# Patient Record
Sex: Female | Born: 1935 | Race: White | Hispanic: No | Smoking: Former smoker
Health system: Southern US, Community
[De-identification: ages and names within clinical notes are randomized; demographics above are authoritative.]

## PROBLEM LIST (undated history)

## (undated) DIAGNOSIS — I471 Supraventricular tachycardia: Secondary | ICD-10-CM

## (undated) DIAGNOSIS — F411 Generalized anxiety disorder: Secondary | ICD-10-CM

## (undated) DIAGNOSIS — I10 Essential (primary) hypertension: Secondary | ICD-10-CM

## (undated) DIAGNOSIS — H3530 Unspecified macular degeneration: Secondary | ICD-10-CM

## (undated) DIAGNOSIS — R7303 Prediabetes: Secondary | ICD-10-CM

## (undated) DIAGNOSIS — I4719 Other supraventricular tachycardia: Secondary | ICD-10-CM

## (undated) DIAGNOSIS — M1990 Unspecified osteoarthritis, unspecified site: Secondary | ICD-10-CM

## (undated) DIAGNOSIS — D649 Anemia, unspecified: Secondary | ICD-10-CM

## (undated) DIAGNOSIS — E785 Hyperlipidemia, unspecified: Secondary | ICD-10-CM

## (undated) DIAGNOSIS — M810 Age-related osteoporosis without current pathological fracture: Secondary | ICD-10-CM

## (undated) DIAGNOSIS — C50919 Malignant neoplasm of unspecified site of unspecified female breast: Secondary | ICD-10-CM

## (undated) DIAGNOSIS — I341 Nonrheumatic mitral (valve) prolapse: Secondary | ICD-10-CM

## (undated) HISTORY — DX: Hyperlipidemia, unspecified: E78.5

## (undated) HISTORY — DX: Unspecified osteoarthritis, unspecified site: M19.90

## (undated) HISTORY — DX: Essential (primary) hypertension: I10

## (undated) HISTORY — DX: Generalized anxiety disorder: F41.1

## (undated) HISTORY — DX: Prediabetes: R73.03

## (undated) HISTORY — DX: Supraventricular tachycardia: I47.1

## (undated) HISTORY — DX: Anemia, unspecified: D64.9

## (undated) HISTORY — DX: Unspecified macular degeneration: H35.30

## (undated) HISTORY — PX: CATARACT EXTRACTION: SUR2

## (undated) HISTORY — DX: Other supraventricular tachycardia: I47.19

## (undated) HISTORY — DX: Age-related osteoporosis without current pathological fracture: M81.0

## (undated) HISTORY — DX: Malignant neoplasm of unspecified site of unspecified female breast: C50.919

## (undated) HISTORY — DX: Nonrheumatic mitral (valve) prolapse: I34.1

---

## 1999-05-21 ENCOUNTER — Other Ambulatory Visit: Admission: RE | Admit: 1999-05-21 | Discharge: 1999-05-21 | Payer: Self-pay | Admitting: Family Medicine

## 2001-02-20 ENCOUNTER — Emergency Department (HOSPITAL_COMMUNITY): Admission: EM | Admit: 2001-02-20 | Discharge: 2001-02-20 | Payer: Self-pay | Admitting: Emergency Medicine

## 2001-05-11 ENCOUNTER — Other Ambulatory Visit: Admission: RE | Admit: 2001-05-11 | Discharge: 2001-05-11 | Payer: Self-pay | Admitting: Family Medicine

## 2003-05-31 ENCOUNTER — Other Ambulatory Visit: Admission: RE | Admit: 2003-05-31 | Discharge: 2003-05-31 | Payer: Self-pay | Admitting: Family Medicine

## 2004-05-10 ENCOUNTER — Ambulatory Visit: Payer: Self-pay | Admitting: Internal Medicine

## 2004-05-30 ENCOUNTER — Ambulatory Visit: Payer: Self-pay | Admitting: Family Medicine

## 2004-06-01 ENCOUNTER — Ambulatory Visit: Payer: Self-pay | Admitting: Family Medicine

## 2004-07-13 ENCOUNTER — Ambulatory Visit: Payer: Self-pay | Admitting: Cardiology

## 2004-07-26 ENCOUNTER — Ambulatory Visit: Payer: Self-pay | Admitting: Family Medicine

## 2004-09-03 ENCOUNTER — Ambulatory Visit: Payer: Self-pay | Admitting: Family Medicine

## 2004-09-05 HISTORY — PX: CARPAL TUNNEL RELEASE: SHX101

## 2004-09-12 ENCOUNTER — Ambulatory Visit: Payer: Self-pay | Admitting: Family Medicine

## 2004-09-13 ENCOUNTER — Ambulatory Visit: Payer: Self-pay | Admitting: Cardiology

## 2004-09-17 ENCOUNTER — Ambulatory Visit: Payer: Self-pay

## 2004-11-22 ENCOUNTER — Ambulatory Visit: Payer: Self-pay | Admitting: Family Medicine

## 2005-05-16 ENCOUNTER — Ambulatory Visit: Payer: Self-pay | Admitting: Family Medicine

## 2005-05-29 ENCOUNTER — Other Ambulatory Visit: Admission: RE | Admit: 2005-05-29 | Discharge: 2005-05-29 | Payer: Self-pay | Admitting: Family Medicine

## 2005-05-29 ENCOUNTER — Ambulatory Visit: Payer: Self-pay | Admitting: Family Medicine

## 2005-05-29 ENCOUNTER — Encounter: Payer: Self-pay | Admitting: Family Medicine

## 2005-08-23 ENCOUNTER — Ambulatory Visit: Payer: Self-pay | Admitting: Family Medicine

## 2005-09-13 ENCOUNTER — Ambulatory Visit: Payer: Self-pay | Admitting: Family Medicine

## 2005-10-02 ENCOUNTER — Ambulatory Visit: Payer: Self-pay | Admitting: Family Medicine

## 2005-10-28 ENCOUNTER — Ambulatory Visit: Payer: Self-pay | Admitting: Family Medicine

## 2005-12-10 ENCOUNTER — Ambulatory Visit: Payer: Self-pay | Admitting: Family Medicine

## 2005-12-31 ENCOUNTER — Ambulatory Visit: Payer: Self-pay | Admitting: Family Medicine

## 2006-01-14 ENCOUNTER — Ambulatory Visit: Payer: Self-pay | Admitting: Family Medicine

## 2006-04-03 ENCOUNTER — Ambulatory Visit: Payer: Self-pay | Admitting: Family Medicine

## 2006-04-30 ENCOUNTER — Ambulatory Visit: Payer: Self-pay | Admitting: Family Medicine

## 2006-05-06 ENCOUNTER — Encounter: Admission: RE | Admit: 2006-05-06 | Discharge: 2006-05-06 | Payer: Self-pay | Admitting: Family Medicine

## 2006-06-06 ENCOUNTER — Ambulatory Visit: Payer: Self-pay | Admitting: Family Medicine

## 2006-06-18 ENCOUNTER — Ambulatory Visit: Payer: Self-pay | Admitting: Family Medicine

## 2006-06-18 LAB — CONVERTED CEMR LAB
ALT: 47 units/L — ABNORMAL HIGH (ref 0–40)
AST: 35 units/L (ref 0–37)
Albumin: 3.5 g/dL (ref 3.5–5.2)
Alkaline Phosphatase: 53 units/L (ref 39–117)
BUN: 11 mg/dL (ref 6–23)
CO2: 26 meq/L (ref 19–32)
Calcium: 9 mg/dL (ref 8.4–10.5)
Chloride: 107 meq/L (ref 96–112)
Creatinine, Ser: 1 mg/dL (ref 0.4–1.2)
GFR calc non Af Amer: 57 mL/min
Glomerular Filtration Rate, Af Am: 69 mL/min/{1.73_m2}
Glucose, Bld: 124 mg/dL — ABNORMAL HIGH (ref 70–99)
Hgb A1c MFr Bld: 5.8 % (ref 4.6–6.0)
Potassium: 3.9 meq/L (ref 3.5–5.1)
Sodium: 141 meq/L (ref 135–145)
Total Bilirubin: 0.6 mg/dL (ref 0.3–1.2)
Total Protein: 6.6 g/dL (ref 6.0–8.3)

## 2006-07-25 DIAGNOSIS — I10 Essential (primary) hypertension: Secondary | ICD-10-CM

## 2006-07-25 HISTORY — DX: Essential (primary) hypertension: I10

## 2006-07-31 ENCOUNTER — Ambulatory Visit: Payer: Self-pay | Admitting: Family Medicine

## 2006-08-11 ENCOUNTER — Ambulatory Visit: Payer: Self-pay | Admitting: Cardiology

## 2006-08-25 ENCOUNTER — Ambulatory Visit: Payer: Self-pay | Admitting: Cardiology

## 2006-08-25 LAB — CONVERTED CEMR LAB
BUN: 13 mg/dL (ref 6–23)
CO2: 29 meq/L (ref 19–32)
Calcium: 9 mg/dL (ref 8.4–10.5)
Chloride: 87 meq/L — ABNORMAL LOW (ref 96–112)
Creatinine, Ser: 0.7 mg/dL (ref 0.4–1.2)
GFR calc Af Amer: 104 mL/min
GFR calc non Af Amer: 86 mL/min
Glucose, Bld: 116 mg/dL — ABNORMAL HIGH (ref 70–99)
Potassium: 4 meq/L (ref 3.5–5.1)
Sodium: 124 meq/L — ABNORMAL LOW (ref 135–145)

## 2006-09-01 ENCOUNTER — Ambulatory Visit: Payer: Self-pay | Admitting: Family Medicine

## 2006-09-01 LAB — CONVERTED CEMR LAB: Hgb A1c MFr Bld: 6.2 % — ABNORMAL HIGH (ref 4.6–6.0)

## 2006-09-16 ENCOUNTER — Ambulatory Visit: Payer: Self-pay | Admitting: Family Medicine

## 2006-09-16 LAB — CONVERTED CEMR LAB
Albumin: 3.4 g/dL — ABNORMAL LOW (ref 3.5–5.2)
Alkaline Phosphatase: 39 units/L (ref 39–117)
BUN: 11 mg/dL (ref 6–23)
Creatinine, Ser: 0.7 mg/dL (ref 0.4–1.2)
GFR calc Af Amer: 104 mL/min
GFR calc non Af Amer: 86 mL/min
Potassium: 3.7 meq/L (ref 3.5–5.1)

## 2006-09-22 ENCOUNTER — Ambulatory Visit: Payer: Self-pay | Admitting: Cardiology

## 2006-10-03 ENCOUNTER — Ambulatory Visit: Payer: Self-pay | Admitting: Cardiology

## 2006-10-14 ENCOUNTER — Ambulatory Visit: Payer: Self-pay | Admitting: Family Medicine

## 2006-10-20 ENCOUNTER — Ambulatory Visit: Payer: Self-pay | Admitting: Cardiology

## 2006-10-20 LAB — CONVERTED CEMR LAB
BUN: 14 mg/dL (ref 6–23)
CO2: 29 meq/L (ref 19–32)
Calcium: 8.3 mg/dL — ABNORMAL LOW (ref 8.4–10.5)
Creatinine, Ser: 0.8 mg/dL (ref 0.4–1.2)

## 2006-10-31 ENCOUNTER — Ambulatory Visit: Payer: Self-pay | Admitting: Family Medicine

## 2006-11-04 ENCOUNTER — Ambulatory Visit: Payer: Self-pay | Admitting: Cardiology

## 2006-11-04 LAB — CONVERTED CEMR LAB
CO2: 29 meq/L (ref 19–32)
Calcium: 8.9 mg/dL (ref 8.4–10.5)
Chloride: 98 meq/L (ref 96–112)
GFR calc non Af Amer: 86 mL/min
Glucose, Bld: 107 mg/dL — ABNORMAL HIGH (ref 70–99)

## 2006-11-17 ENCOUNTER — Ambulatory Visit: Payer: Self-pay | Admitting: Cardiology

## 2006-12-02 ENCOUNTER — Ambulatory Visit: Payer: Self-pay | Admitting: Family Medicine

## 2006-12-08 ENCOUNTER — Ambulatory Visit: Payer: Self-pay | Admitting: Cardiology

## 2006-12-08 LAB — CONVERTED CEMR LAB
Calcium: 8.8 mg/dL (ref 8.4–10.5)
Chloride: 105 meq/L (ref 96–112)
Creatinine, Ser: 0.8 mg/dL (ref 0.4–1.2)
Glucose, Bld: 164 mg/dL — ABNORMAL HIGH (ref 70–99)
Sodium: 138 meq/L (ref 135–145)

## 2006-12-10 ENCOUNTER — Encounter (INDEPENDENT_AMBULATORY_CARE_PROVIDER_SITE_OTHER): Payer: Self-pay | Admitting: *Deleted

## 2007-01-14 ENCOUNTER — Ambulatory Visit: Payer: Self-pay | Admitting: Cardiology

## 2007-01-21 ENCOUNTER — Ambulatory Visit: Payer: Self-pay | Admitting: Family Medicine

## 2007-02-23 ENCOUNTER — Telehealth (INDEPENDENT_AMBULATORY_CARE_PROVIDER_SITE_OTHER): Payer: Self-pay | Admitting: *Deleted

## 2007-03-10 ENCOUNTER — Telehealth (INDEPENDENT_AMBULATORY_CARE_PROVIDER_SITE_OTHER): Payer: Self-pay | Admitting: *Deleted

## 2007-03-29 ENCOUNTER — Encounter: Payer: Self-pay | Admitting: Family Medicine

## 2007-03-29 DIAGNOSIS — N951 Menopausal and female climacteric states: Secondary | ICD-10-CM

## 2007-04-08 ENCOUNTER — Encounter: Payer: Self-pay | Admitting: Family Medicine

## 2007-05-06 ENCOUNTER — Ambulatory Visit: Payer: Self-pay | Admitting: Family Medicine

## 2007-05-06 ENCOUNTER — Telehealth (INDEPENDENT_AMBULATORY_CARE_PROVIDER_SITE_OTHER): Payer: Self-pay | Admitting: *Deleted

## 2007-05-19 ENCOUNTER — Telehealth: Payer: Self-pay | Admitting: Family Medicine

## 2007-05-19 ENCOUNTER — Encounter: Payer: Self-pay | Admitting: Family Medicine

## 2007-05-25 ENCOUNTER — Encounter: Payer: Self-pay | Admitting: Family Medicine

## 2007-06-05 ENCOUNTER — Encounter (INDEPENDENT_AMBULATORY_CARE_PROVIDER_SITE_OTHER): Payer: Self-pay | Admitting: *Deleted

## 2007-07-27 ENCOUNTER — Ambulatory Visit: Payer: Self-pay | Admitting: Cardiology

## 2007-09-23 ENCOUNTER — Other Ambulatory Visit: Admission: RE | Admit: 2007-09-23 | Discharge: 2007-09-23 | Payer: Self-pay | Admitting: Family Medicine

## 2007-09-23 ENCOUNTER — Ambulatory Visit: Payer: Self-pay | Admitting: Family Medicine

## 2007-09-23 ENCOUNTER — Encounter: Payer: Self-pay | Admitting: Family Medicine

## 2007-09-23 DIAGNOSIS — F419 Anxiety disorder, unspecified: Secondary | ICD-10-CM

## 2007-09-23 DIAGNOSIS — F411 Generalized anxiety disorder: Secondary | ICD-10-CM

## 2007-09-23 DIAGNOSIS — M1990 Unspecified osteoarthritis, unspecified site: Secondary | ICD-10-CM | POA: Insufficient documentation

## 2007-09-23 DIAGNOSIS — M810 Age-related osteoporosis without current pathological fracture: Secondary | ICD-10-CM | POA: Insufficient documentation

## 2007-09-23 DIAGNOSIS — E739 Lactose intolerance, unspecified: Secondary | ICD-10-CM

## 2007-09-23 HISTORY — DX: Generalized anxiety disorder: F41.1

## 2007-09-23 LAB — CONVERTED CEMR LAB
Blood in Urine, dipstick: NEGATIVE
Glucose, Urine, Semiquant: NEGATIVE
Nitrite: NEGATIVE
Protein, U semiquant: NEGATIVE
Urobilinogen, UA: NEGATIVE
WBC Urine, dipstick: NEGATIVE
pH: 7.5

## 2007-09-25 ENCOUNTER — Encounter (INDEPENDENT_AMBULATORY_CARE_PROVIDER_SITE_OTHER): Payer: Self-pay | Admitting: *Deleted

## 2007-09-29 ENCOUNTER — Ambulatory Visit: Payer: Self-pay | Admitting: Family Medicine

## 2007-09-29 ENCOUNTER — Encounter (INDEPENDENT_AMBULATORY_CARE_PROVIDER_SITE_OTHER): Payer: Self-pay | Admitting: *Deleted

## 2007-09-29 LAB — CONVERTED CEMR LAB
OCCULT 1: NEGATIVE
OCCULT 2: NEGATIVE

## 2007-09-30 ENCOUNTER — Telehealth (INDEPENDENT_AMBULATORY_CARE_PROVIDER_SITE_OTHER): Payer: Self-pay | Admitting: *Deleted

## 2007-10-04 LAB — CONVERTED CEMR LAB
ALT: 20 units/L (ref 0–35)
AST: 24 units/L (ref 0–37)
Albumin: 3.8 g/dL (ref 3.5–5.2)
Basophils Relative: 1.1 % — ABNORMAL HIGH (ref 0.0–1.0)
Bilirubin, Direct: 0.1 mg/dL (ref 0.0–0.3)
CRP, High Sensitivity: <1 — ABNORMAL LOW (ref 0.00–5.00)
Calcium: 9.4 mg/dL (ref 8.4–10.5)
Chloride: 106 meq/L (ref 96–112)
Creatinine, Ser: 0.9 mg/dL (ref 0.4–1.2)
Eosinophils Absolute: 0.3 10*3/uL (ref 0.0–0.6)
Eosinophils Relative: 3.6 % (ref 0.0–5.0)
GFR calc non Af Amer: 64 mL/min
Glucose, Bld: 122 mg/dL — ABNORMAL HIGH (ref 70–99)
HCT: 37.8 % (ref 36.0–46.0)
LDL Cholesterol: 71 mg/dL (ref 0–99)
Neutrophils Relative %: 65.3 % (ref 43.0–77.0)
Platelets: 219 10*3/uL (ref 150–400)
RBC: 3.94 M/uL (ref 3.87–5.11)
RDW: 11.9 % (ref 11.5–14.6)
Sodium: 141 meq/L (ref 135–145)
Total Bilirubin: 1.2 mg/dL (ref 0.3–1.2)
Total CHOL/HDL Ratio: 2.7
Triglycerides: 100 mg/dL (ref 0–149)
VLDL: 20 mg/dL (ref 0–40)
WBC: 9.6 10*3/uL (ref 4.5–10.5)

## 2007-10-05 ENCOUNTER — Encounter (INDEPENDENT_AMBULATORY_CARE_PROVIDER_SITE_OTHER): Payer: Self-pay | Admitting: *Deleted

## 2007-10-08 ENCOUNTER — Telehealth (INDEPENDENT_AMBULATORY_CARE_PROVIDER_SITE_OTHER): Payer: Self-pay | Admitting: *Deleted

## 2007-11-20 ENCOUNTER — Telehealth (INDEPENDENT_AMBULATORY_CARE_PROVIDER_SITE_OTHER): Payer: Self-pay | Admitting: *Deleted

## 2007-11-20 DIAGNOSIS — R109 Unspecified abdominal pain: Secondary | ICD-10-CM | POA: Insufficient documentation

## 2007-12-15 ENCOUNTER — Ambulatory Visit: Payer: Self-pay | Admitting: Family Medicine

## 2007-12-17 LAB — CONVERTED CEMR LAB
BUN: 17 mg/dL (ref 6–23)
CO2: 25 meq/L (ref 19–32)
Calcium: 8.9 mg/dL (ref 8.4–10.5)
Chloride: 106 meq/L (ref 96–112)
Cholesterol: 135 mg/dL (ref 0–200)
Creatinine, Ser: 0.9 mg/dL (ref 0.4–1.2)
HDL: 54.6 mg/dL (ref >39.0)
Triglycerides: 50 mg/dL (ref 0–149)

## 2008-02-25 ENCOUNTER — Ambulatory Visit: Payer: Self-pay | Admitting: Family Medicine

## 2008-02-25 DIAGNOSIS — K120 Recurrent oral aphthae: Secondary | ICD-10-CM

## 2008-02-26 ENCOUNTER — Telehealth (INDEPENDENT_AMBULATORY_CARE_PROVIDER_SITE_OTHER): Payer: Self-pay | Admitting: *Deleted

## 2008-03-31 ENCOUNTER — Telehealth (INDEPENDENT_AMBULATORY_CARE_PROVIDER_SITE_OTHER): Payer: Self-pay | Admitting: *Deleted

## 2008-03-31 ENCOUNTER — Ambulatory Visit: Payer: Self-pay | Admitting: Family Medicine

## 2008-05-26 ENCOUNTER — Encounter: Payer: Self-pay | Admitting: Family Medicine

## 2008-06-17 ENCOUNTER — Ambulatory Visit: Payer: Self-pay | Admitting: Family Medicine

## 2008-06-28 LAB — CONVERTED CEMR LAB
Bilirubin, Direct: 0.1 mg/dL (ref 0.0–0.3)
Calcium: 9.1 mg/dL (ref 8.4–10.5)
GFR calc Af Amer: 77 mL/min
GFR calc non Af Amer: 64 mL/min
HDL: 50.9 mg/dL (ref >39.0)
LDL Cholesterol: 63 mg/dL (ref 0–99)
Potassium: 3.7 meq/L (ref 3.5–5.1)
Sodium: 139 meq/L (ref 135–145)
Total Bilirubin: 0.8 mg/dL (ref 0.3–1.2)
Total CHOL/HDL Ratio: 2.6
Total Protein: 7 g/dL (ref 6.0–8.3)
VLDL: 18 mg/dL (ref 0–40)

## 2008-06-29 ENCOUNTER — Encounter (INDEPENDENT_AMBULATORY_CARE_PROVIDER_SITE_OTHER): Payer: Self-pay | Admitting: *Deleted

## 2008-07-21 ENCOUNTER — Encounter: Payer: Self-pay | Admitting: Family Medicine

## 2008-07-21 ENCOUNTER — Ambulatory Visit: Payer: Self-pay | Admitting: Cardiology

## 2008-07-21 ENCOUNTER — Encounter: Payer: Self-pay | Admitting: Cardiology

## 2008-07-21 DIAGNOSIS — R0989 Other specified symptoms and signs involving the circulatory and respiratory systems: Secondary | ICD-10-CM

## 2008-07-26 ENCOUNTER — Ambulatory Visit: Payer: Self-pay

## 2008-08-18 ENCOUNTER — Telehealth: Payer: Self-pay | Admitting: Family Medicine

## 2008-08-19 ENCOUNTER — Ambulatory Visit: Payer: Self-pay | Admitting: Family Medicine

## 2008-08-19 DIAGNOSIS — R300 Dysuria: Secondary | ICD-10-CM | POA: Insufficient documentation

## 2008-08-19 LAB — CONVERTED CEMR LAB
Bilirubin Urine: NEGATIVE
Blood in Urine, dipstick: NEGATIVE
Glucose, Urine, Semiquant: NEGATIVE
Ketones, urine, test strip: NEGATIVE
Protein, U semiquant: NEGATIVE

## 2008-08-22 ENCOUNTER — Encounter (INDEPENDENT_AMBULATORY_CARE_PROVIDER_SITE_OTHER): Payer: Self-pay | Admitting: *Deleted

## 2008-09-26 ENCOUNTER — Ambulatory Visit: Payer: Self-pay | Admitting: Family Medicine

## 2008-09-26 DIAGNOSIS — S61209A Unspecified open wound of unspecified finger without damage to nail, initial encounter: Secondary | ICD-10-CM | POA: Insufficient documentation

## 2008-12-21 ENCOUNTER — Telehealth (INDEPENDENT_AMBULATORY_CARE_PROVIDER_SITE_OTHER): Payer: Self-pay | Admitting: *Deleted

## 2009-01-03 ENCOUNTER — Telehealth (INDEPENDENT_AMBULATORY_CARE_PROVIDER_SITE_OTHER): Payer: Self-pay | Admitting: *Deleted

## 2009-01-03 ENCOUNTER — Ambulatory Visit: Payer: Self-pay | Admitting: Family Medicine

## 2009-01-18 ENCOUNTER — Encounter (INDEPENDENT_AMBULATORY_CARE_PROVIDER_SITE_OTHER): Payer: Self-pay | Admitting: *Deleted

## 2009-01-18 LAB — CONVERTED CEMR LAB
AST: 23 units/L (ref 0–37)
BUN: 13 mg/dL (ref 6–23)
Bilirubin, Direct: 0 mg/dL (ref 0.0–0.3)
CO2: 28 meq/L (ref 19–32)
Calcium: 9.2 mg/dL (ref 8.4–10.5)
Glucose, Bld: 109 mg/dL — ABNORMAL HIGH (ref 70–99)
HDL: 47 mg/dL (ref >39.00)
Hgb A1c MFr Bld: 5.8 % (ref 4.6–6.5)
Potassium: 3.9 meq/L (ref 3.5–5.1)
Sodium: 142 meq/L (ref 135–145)
Total Bilirubin: 0.7 mg/dL (ref 0.3–1.2)
Total CHOL/HDL Ratio: 3
VLDL: 26 mg/dL (ref 0.0–40.0)

## 2009-01-23 ENCOUNTER — Telehealth: Payer: Self-pay | Admitting: Cardiology

## 2009-01-25 ENCOUNTER — Ambulatory Visit: Payer: Self-pay | Admitting: Cardiology

## 2009-02-10 ENCOUNTER — Ambulatory Visit: Payer: Self-pay | Admitting: Family Medicine

## 2009-02-10 DIAGNOSIS — H6500 Acute serous otitis media, unspecified ear: Secondary | ICD-10-CM | POA: Insufficient documentation

## 2009-02-10 DIAGNOSIS — H9209 Otalgia, unspecified ear: Secondary | ICD-10-CM

## 2009-03-30 ENCOUNTER — Ambulatory Visit: Payer: Self-pay | Admitting: Cardiology

## 2009-04-12 ENCOUNTER — Ambulatory Visit: Payer: Self-pay | Admitting: Family Medicine

## 2009-04-13 ENCOUNTER — Ambulatory Visit: Payer: Self-pay | Admitting: Family Medicine

## 2009-04-13 DIAGNOSIS — K644 Residual hemorrhoidal skin tags: Secondary | ICD-10-CM | POA: Insufficient documentation

## 2009-04-13 DIAGNOSIS — B354 Tinea corporis: Secondary | ICD-10-CM | POA: Insufficient documentation

## 2009-04-23 ENCOUNTER — Emergency Department (HOSPITAL_COMMUNITY): Admission: EM | Admit: 2009-04-23 | Discharge: 2009-04-23 | Payer: Self-pay | Admitting: Emergency Medicine

## 2009-04-23 ENCOUNTER — Encounter: Payer: Self-pay | Admitting: Cardiology

## 2009-04-24 ENCOUNTER — Telehealth: Payer: Self-pay | Admitting: Cardiology

## 2009-05-16 ENCOUNTER — Ambulatory Visit: Payer: Self-pay | Admitting: Cardiology

## 2009-05-16 DIAGNOSIS — R002 Palpitations: Secondary | ICD-10-CM

## 2009-05-29 ENCOUNTER — Encounter: Payer: Self-pay | Admitting: Family Medicine

## 2009-06-09 ENCOUNTER — Telehealth: Payer: Self-pay | Admitting: Family Medicine

## 2009-06-15 ENCOUNTER — Ambulatory Visit: Payer: Self-pay | Admitting: Family Medicine

## 2009-06-15 LAB — CONVERTED CEMR LAB
Blood in Urine, dipstick: NEGATIVE
Ketones, urine, test strip: NEGATIVE
Nitrite: NEGATIVE
Urobilinogen, UA: 0.2

## 2009-06-19 ENCOUNTER — Telehealth (INDEPENDENT_AMBULATORY_CARE_PROVIDER_SITE_OTHER): Payer: Self-pay | Admitting: *Deleted

## 2009-06-23 ENCOUNTER — Telehealth: Payer: Self-pay | Admitting: Cardiology

## 2009-07-03 ENCOUNTER — Ambulatory Visit: Payer: Self-pay | Admitting: Family Medicine

## 2009-07-03 DIAGNOSIS — E785 Hyperlipidemia, unspecified: Secondary | ICD-10-CM

## 2009-07-03 HISTORY — DX: Hyperlipidemia, unspecified: E78.5

## 2009-07-06 ENCOUNTER — Encounter (INDEPENDENT_AMBULATORY_CARE_PROVIDER_SITE_OTHER): Payer: Self-pay | Admitting: *Deleted

## 2009-07-06 ENCOUNTER — Telehealth: Payer: Self-pay | Admitting: Family Medicine

## 2009-07-06 LAB — CONVERTED CEMR LAB
Albumin: 3.7 g/dL (ref 3.5–5.2)
CO2: 26 meq/L (ref 19–32)
Calcium: 9.1 mg/dL (ref 8.4–10.5)
Chloride: 104 meq/L (ref 96–112)
HDL: 48 mg/dL (ref >39.00)
LDL Cholesterol: 78 mg/dL (ref 0–99)
Sodium: 139 meq/L (ref 135–145)
Total CHOL/HDL Ratio: 3
Triglycerides: 103 mg/dL (ref 0.0–149.0)
VLDL: 20.6 mg/dL (ref 0.0–40.0)

## 2009-07-11 ENCOUNTER — Telehealth: Payer: Self-pay | Admitting: Cardiology

## 2009-08-18 ENCOUNTER — Telehealth: Payer: Self-pay | Admitting: Family Medicine

## 2009-08-21 ENCOUNTER — Ambulatory Visit: Payer: Self-pay | Admitting: Family Medicine

## 2009-08-22 LAB — CONVERTED CEMR LAB
Basophils Relative: 0.4 % (ref 0.0–3.0)
Eosinophils Relative: 2.7 % (ref 0.0–5.0)
HCT: 34.1 % — ABNORMAL LOW (ref 36.0–46.0)
MCV: 96.3 fL (ref 78.0–100.0)
Monocytes Absolute: 0.9 10*3/uL (ref 0.1–1.0)
Neutrophils Relative %: 58.1 % (ref 43.0–77.0)
RBC: 3.54 M/uL — ABNORMAL LOW (ref 3.87–5.11)
WBC: 6.9 10*3/uL (ref 4.5–10.5)

## 2009-08-23 ENCOUNTER — Telehealth: Payer: Self-pay | Admitting: Family Medicine

## 2009-09-05 ENCOUNTER — Telehealth: Payer: Self-pay | Admitting: Family Medicine

## 2009-09-05 ENCOUNTER — Encounter (INDEPENDENT_AMBULATORY_CARE_PROVIDER_SITE_OTHER): Payer: Self-pay | Admitting: *Deleted

## 2009-09-11 ENCOUNTER — Ambulatory Visit: Payer: Self-pay | Admitting: Cardiology

## 2009-09-18 ENCOUNTER — Telehealth: Payer: Self-pay | Admitting: Family Medicine

## 2009-09-19 ENCOUNTER — Ambulatory Visit: Payer: Self-pay | Admitting: Gastroenterology

## 2009-09-20 ENCOUNTER — Telehealth (INDEPENDENT_AMBULATORY_CARE_PROVIDER_SITE_OTHER): Payer: Self-pay | Admitting: *Deleted

## 2009-11-14 ENCOUNTER — Encounter: Payer: Self-pay | Admitting: Family Medicine

## 2009-12-13 ENCOUNTER — Encounter: Payer: Self-pay | Admitting: Family Medicine

## 2009-12-14 ENCOUNTER — Telehealth: Payer: Self-pay | Admitting: Family Medicine

## 2009-12-14 ENCOUNTER — Ambulatory Visit: Payer: Self-pay | Admitting: Family Medicine

## 2009-12-15 LAB — CONVERTED CEMR LAB
Basophils Relative: 0.8 % (ref 0.0–3.0)
Bilirubin, Direct: 0.2 mg/dL (ref 0.0–0.3)
Calcium: 9.5 mg/dL (ref 8.4–10.5)
Chloride: 107 meq/L (ref 96–112)
Creatinine, Ser: 0.9 mg/dL (ref 0.4–1.2)
Eosinophils Absolute: 0.2 10*3/uL (ref 0.0–0.7)
Eosinophils Relative: 3 % (ref 0.0–5.0)
Ferritin: 123.3 ng/mL (ref 10.0–291.0)
GFR calc non Af Amer: 64.61 mL/min (ref >60)
HCT: 34.7 % — ABNORMAL LOW (ref 36.0–46.0)
Hemoglobin: 11.9 g/dL — ABNORMAL LOW (ref 12.0–15.0)
LDL Cholesterol: 70 mg/dL (ref 0–99)
MCHC: 34.2 g/dL (ref 30.0–36.0)
MCV: 97.1 fL (ref 78.0–100.0)
Monocytes Absolute: 0.9 10*3/uL (ref 0.1–1.0)
Neutro Abs: 3.5 10*3/uL (ref 1.4–7.7)
RBC: 3.58 M/uL — ABNORMAL LOW (ref 3.87–5.11)
Total Bilirubin: 1 mg/dL (ref 0.3–1.2)
Total CHOL/HDL Ratio: 3
Triglycerides: 94 mg/dL (ref 0.0–149.0)
VLDL: 18.8 mg/dL (ref 0.0–40.0)

## 2010-03-09 ENCOUNTER — Telehealth: Payer: Self-pay | Admitting: Family Medicine

## 2010-03-20 ENCOUNTER — Ambulatory Visit: Payer: Self-pay | Admitting: Cardiology

## 2010-03-22 ENCOUNTER — Telehealth (INDEPENDENT_AMBULATORY_CARE_PROVIDER_SITE_OTHER): Payer: Self-pay | Admitting: *Deleted

## 2010-03-27 ENCOUNTER — Ambulatory Visit: Payer: Self-pay | Admitting: Family Medicine

## 2010-05-11 ENCOUNTER — Ambulatory Visit: Payer: Self-pay | Admitting: Family Medicine

## 2010-05-11 DIAGNOSIS — R05 Cough: Secondary | ICD-10-CM

## 2010-05-11 DIAGNOSIS — J019 Acute sinusitis, unspecified: Secondary | ICD-10-CM | POA: Insufficient documentation

## 2010-05-11 DIAGNOSIS — K117 Disturbances of salivary secretion: Secondary | ICD-10-CM

## 2010-05-28 ENCOUNTER — Telehealth: Payer: Self-pay | Admitting: Family Medicine

## 2010-05-28 ENCOUNTER — Ambulatory Visit: Payer: Self-pay | Admitting: Family Medicine

## 2010-05-28 DIAGNOSIS — R079 Chest pain, unspecified: Secondary | ICD-10-CM

## 2010-05-30 ENCOUNTER — Encounter: Payer: Self-pay | Admitting: Family Medicine

## 2010-06-15 ENCOUNTER — Ambulatory Visit: Payer: Self-pay | Admitting: Family Medicine

## 2010-06-15 ENCOUNTER — Encounter: Payer: Self-pay | Admitting: Family Medicine

## 2010-06-18 LAB — CONVERTED CEMR LAB
ALT: 19 units/L (ref 0–35)
Alkaline Phosphatase: 49 units/L (ref 39–117)
Basophils Relative: 0.6 % (ref 0.0–3.0)
Bilirubin, Direct: 0.1 mg/dL (ref 0.0–0.3)
Chloride: 106 meq/L (ref 96–112)
Creatinine, Ser: 0.9 mg/dL (ref 0.4–1.2)
Eosinophils Relative: 4.2 % (ref 0.0–5.0)
Hemoglobin: 12.6 g/dL (ref 12.0–15.0)
LDL Cholesterol: 72 mg/dL (ref 0–99)
Lymphocytes Relative: 22.3 % (ref 12.0–46.0)
MCV: 97.8 fL (ref 78.0–100.0)
Monocytes Absolute: 0.8 10*3/uL (ref 0.1–1.0)
Neutrophils Relative %: 62.5 % (ref 43.0–77.0)
RBC: 3.78 M/uL — ABNORMAL LOW (ref 3.87–5.11)
Sodium: 142 meq/L (ref 135–145)
Total CHOL/HDL Ratio: 3
Total Protein: 7.1 g/dL (ref 6.0–8.3)
Triglycerides: 89 mg/dL (ref 0.0–149.0)
WBC: 8.1 10*3/uL (ref 4.5–10.5)

## 2010-06-26 ENCOUNTER — Telehealth (INDEPENDENT_AMBULATORY_CARE_PROVIDER_SITE_OTHER): Payer: Self-pay | Admitting: *Deleted

## 2010-07-05 ENCOUNTER — Encounter: Payer: Self-pay | Admitting: Family Medicine

## 2010-07-13 ENCOUNTER — Encounter: Payer: Self-pay | Admitting: Family Medicine

## 2010-07-15 ENCOUNTER — Encounter: Payer: Self-pay | Admitting: Family Medicine

## 2010-07-17 ENCOUNTER — Ambulatory Visit
Admission: RE | Admit: 2010-07-17 | Discharge: 2010-07-17 | Payer: Self-pay | Source: Home / Self Care | Attending: Family Medicine | Admitting: Family Medicine

## 2010-07-17 ENCOUNTER — Other Ambulatory Visit: Payer: Self-pay | Admitting: Family Medicine

## 2010-07-17 ENCOUNTER — Telehealth (INDEPENDENT_AMBULATORY_CARE_PROVIDER_SITE_OTHER): Payer: Self-pay | Admitting: *Deleted

## 2010-07-19 LAB — FECAL OCCULT BLOOD, IMMUNOCHEMICAL: Fecal Occult Bld: NEGATIVE

## 2010-07-25 ENCOUNTER — Telehealth: Payer: Self-pay | Admitting: Cardiology

## 2010-07-30 ENCOUNTER — Telehealth: Payer: Self-pay | Admitting: Cardiology

## 2010-08-05 LAB — CONVERTED CEMR LAB
AST: 22 units/L (ref 0–37)
Alkaline Phosphatase: 50 units/L (ref 39–117)
BUN: 13 mg/dL (ref 6–23)
CO2: 30 meq/L (ref 19–32)
Chloride: 107 meq/L (ref 96–112)
Creatinine, Ser: 0.8 mg/dL (ref 0.4–1.2)
Potassium: 3.9 meq/L (ref 3.5–5.1)
Total Bilirubin: 0.9 mg/dL (ref 0.3–1.2)
Total Protein: 6.8 g/dL (ref 6.0–8.3)

## 2010-08-07 NOTE — Letter (Signed)
Summary: Results Follow up Letter  [facility]  [address] [place], [place] [address]   Phone: [phone]  Fax: [phone]    06/05/2007 MRN: [id number]  Vir[facility] Bourn [address] [place], [place]  [address]  Dear Ms. Duguay,  The following are the results of your recent test(s):  Test         Result    Pap Smear:        Normal _____  Not Normal _____ Comments: ______________________________________________________ Cholesterol: LDL(Bad cholesterol):         Your goal is less than:         HDL (Good cholesterol):       Your goal is more than: Comments:  ______________________________________________________ Mammogram:        Normal __X___  Not Normal _____ Comments:  ___________________________________________________________________ Hemoccult:        Normal _____  Not normal _______ Comments:    _____________________________________________________________________ Other Tests:    We routinely do not discuss normal results over the telephone.  If you desire a copy of the results, or you have any questions about this information we can discuss them at your next office visit.   Sincerely,

## 2010-08-07 NOTE — Progress Notes (Signed)
Summary: REFILL ON DIAZEPAM  Phone Note Call from Patient Call back at Home Phone [phone]   Caller: Patient Summary of Call: PATIENT CAME IN FOR LABS TODAY AND LEFT THIS NOTE: DEAR DR LOWNE, I NEED A REFILL FOR DIAZEPAM 10 MG. COULD YOU INCREASE IT TO #100, PLEASE. I TAKE 5 MG AM & PM AS ORDERED. I HAVE HAD A COUPLE OF BOUTS OF PRONOUNCED HEART PALPITATIONS WITH ERRATIC ELEVATED HEART RATE, BLOOD PRESSURE OVER 190/9 LASTING AN HOUR. I TAKE AN EXTRA DIAZEPAM 10, DR. HOCHREIN ALSO PUT ME ON 2 25 MG DOSE OF TOPROL WHEN PALPITATIONS START WHICH HELPED AND LASTED 30 MINUTES. IT IS VERY SCARY. DR. Antoine Poche MAY HAVE SENT YOU THIS INFORMATION. THE DIAZEPAM HELPS CALM ME. THE PHARMACY IS [facility] ON [address] [id number] ENCLOSED IS A COPY OF HER LAST EKG AT DR. HOCHREINS OFFICE. Initial call taken by: Freddy Jaksch,  December 14, 2009 10:16 AM  Follow-up for Phone Call        Dr hochrein did not mention diazepam in the note.  If she is taking 5mg  two times a day and maybe 1 extra-- that would be #60 for a month supply Follow-up by: Loreen Freud DO,  December 14, 2009 1:21 PM  Additional Follow-up for Phone Call Additional follow up Details #1::        Tried to call pt to inform pt that she needs to call Dr.Hochrein, if Dr.Lowne refills this for a 100 it has to be for a 3 month supply. Army Fossa CMA  December 14, 2009 2:31 PM     Additional Follow-up for Phone Call Additional follow up Details #2::    Pt is aware, this will last her 3 months. Army Fossa CMA  December 15, 2009 9:59 AM   Prescriptions: VALIUM 10 MG  TABS (DIAZEPAM) 1/2 by mouth two times a day as needed  #100 x 0   Entered by:   Army Fossa CMA   Authorized by:   Loreen Freud DO   Signed by:   Army Fossa CMA on 12/15/2009   Method used:   Printed then faxed to ...       [facility]  [address] # [id number]* (retail)       [address]       [place], [place]  [address]       Ph: [phone] or  [phone]       Fax: [phone]   RxID:   [id number]

## 2010-08-07 NOTE — Progress Notes (Signed)
Summary: refill  Phone Note Refill Request Message from:  Patient  Refills Requested: Medication #1:  DILTIAZEM HCL ER BEADS 180 MG  CP24 take one capsule daily Last filled 07-06-09 #90 3, last ov 09-19-09...........Marland KitchenFelecia Deloach CMA  March 09, 2010 9:43 AM    Follow-up for Phone Call        ok to refill #90  3 refills Follow-up by: Loreen Freud DO,  March 09, 2010 10:59 AM    Prescriptions: DILTIAZEM HCL ER BEADS 180 MG  CP24 (DILTIAZEM HCL ER BEADS) take one capsule daily  #90 x 3   Entered and Authorized by:   Loreen Freud DO   Signed by:   Loreen Freud DO on 03/09/2010   Method used:   Electronically to        [id number]* (retail)       [address]       [place], [place]  [address]       Ph: [phone] or [phone]       Fax: [phone]   RxID:   [id number]  Pt aware.               Almeta Monas CMA Duncan Dull)  March 09, 2010 1:07 PM

## 2010-08-07 NOTE — Miscellaneous (Signed)
  Clinical Lists Changes  Medications: Added new medication of LOSARTAN POTASSIUM 100 MG TABS (LOSARTAN POTASSIUM) one daily - Signed Rx of LOSARTAN POTASSIUM 100 MG TABS (LOSARTAN POTASSIUM) one daily;  #90 x 3;  Signed;  Entered by: Charolotte Capuchin, RN;  Authorized by: Rollene Rotunda, MD, [facility];  Method used: Print then Give to Patient    Prescriptions: LOSARTAN POTASSIUM 100 MG TABS (LOSARTAN POTASSIUM) one daily  #90 x 3   Entered by:   Charolotte Capuchin, RN   Authorized by:   Rollene Rotunda, MD, [facility]   Signed by:   Charolotte Capuchin, RN on 09/11/2009   Method used:   Print then Give to Patient   RxID:   [id number]

## 2010-08-07 NOTE — Medication Information (Signed)
Summary: Nonadherence with Prazosin/BCBS  Nonadherence with Prazosin/BCBS   Imported By: Lanelle Bal 11/21/2009 13:41:54  _____________________________________________________________________  External Attachment:    Type:   Image     Comment:   External Document

## 2010-08-07 NOTE — Progress Notes (Signed)
Summary: PHONE-REFILL TO BE REFAX  Phone Note Call from Patient   Caller: Patient Summary of Call: pATIENT STATES THAT HER DIAZEPAM 10 MG IS NOT AT THE PHARMACY YET. PLEASE REFAX. [facility] [address] Initial call taken by: Barb Merino,  September 20, 2009 11:04 AM  Follow-up for Phone Call        Patient ask to speak to the Manager and I let the patient know that the prescription was called in by [facility] on this pass Monday.  She was at the pharmacy and they stated they didn't have it.  I called [facility] on [address] and gave Stephanie(pharmacist) the refill information. Follow-up by: Barnie Mort,  September 20, 2009 11:09 AM

## 2010-08-07 NOTE — Progress Notes (Signed)
Summary: REFERRAL REQUEST  Phone Note Call from Patient   Caller: Patient Call For: Loreen Freud DO Reason for Call: Referral Summary of Call: PATIENT CALLING, SAYS SHE IS NOW READY TO HAVE A SCREENING COLON, SHE IS HAVING CONSTIPATION, LOWER ABD PAIN OCCASIONALLY, SAYS DR. Laury Axon WANTED HER TO HAVE A COLONOSCOPY IN THE PAST BUT SHE WASN'T READY TO GO THROUGH WITH IT.  PATIENT REQUESTING REFERRAL TO GASTRO. Initial call taken by: Magdalen Spatz [facility],  August 18, 2009 2:07 PM  Follow-up for Phone Call        Per dr Laury Axon- since we have not seen her for the problem we would need to see her first. Pt has appt monday to discuss with dr Laury Axon. Army Fossa CMA  August 18, 2009 2:21 PM

## 2010-08-07 NOTE — Progress Notes (Signed)
Summary: Refill  Phone Note Refill Request Call back at Home Phone [phone] Message from:  Patient  Refills Requested: Medication #1:  VALIUM 10 MG  TABS 1/2 by mouth two times a day as needed   Last Refilled: 06/09/2009 last ov-08/21/09. Army Fossa CMA  September 18, 2009 9:45 AM    Follow-up for Phone Call        ok to refill x1 Follow-up by: Loreen Freud DO,  September 18, 2009 9:57 AM  Additional Follow-up for Phone Call Additional follow up Details #1::        sent to [facility] [address]    Prescriptions: VALIUM 10 MG  TABS (DIAZEPAM) 1/2 by mouth two times a day as needed  #90 x 0   Entered by:   Army Fossa CMA   Authorized by:   Loreen Freud DO   Signed by:   Army Fossa CMA on 09/18/2009   Method used:   Printed then faxed to ...       [facility]  [address] [id number]* (retail)       [address]       [place], [place]  [address]       Ph: [phone] or [phone]       Fax: [phone]   RxID:   [id number]

## 2010-08-07 NOTE — Letter (Signed)
Summary: Results Letter  [facility]  [address] [place], [place] [address]   Phone: [phone]  Fax: [phone]        September 19, 2009 MRN: [id number]    Gabrielle Warren [address] [place], [place]  [address]    Dear Ms. Mutschler,  It is my pleasure to have treated you recently as a new patient in my office. I appreciate your confidence and the opportunity to participate in your care.  Since I do have a busy inpatient endoscopy schedule and office schedule, my office hours vary weekly. I am, however, available for emergency calls everyday through my office. If I am not available for an urgent office appointment, another one of our gastroenterologist will be able to assist you.  My well-trained staff are prepared to help you at all times. For emergencies after office hours, a physician from our Gastroenterology section is always available through my 24 hour answering service  Once again I welcome you as a new patient and I look forward to a happy and healthy relationship             Sincerely,  Louis Meckel MD  This letter has been electronically signed by your physician.  Appended Document: Results Letter LETTER MAILED

## 2010-08-07 NOTE — Letter (Signed)
Summary: Letter from Patient Regarding Meds  Letter from Patient Regarding Meds   Imported By: Lanelle Bal 12/22/2009 07:49:44  _____________________________________________________________________  External Attachment:    Type:   Image     Comment:   External Document

## 2010-08-07 NOTE — Assessment & Plan Note (Signed)
Summary: chronic constipation....em   History of Present Illness Visit Type: consult  Primary GI MD: Melvia Heaps MD [facility] Primary Provider: Loreen Freud, DO Requesting Provider: Loreen Freud, DO Chief Complaint: Rectal bleeding, constipation, hemorrhoids, rectal pain, diarrhea, and bloating  History of Present Illness:   Gabrielle Warren  is a 77 year old white female referred at the request of Dr. Laury Axon for evaluation of constipation.  This has been a long-standing problem.  She often strains at the stool.  She has had difficulties with hemorrhoidal discomfort and small amount rectal bleeding.  She has never had a colonoscopy.  She complains of abdominal bloating with her constipation.   GI Review of Systems    Reports bloating.      Denies abdominal pain, acid reflux, belching, chest pain, dysphagia with liquids, dysphagia with solids, heartburn, loss of appetite, nausea, vomiting, vomiting blood, weight loss, and  weight gain.      Reports constipation, diarrhea, hemorrhoids, rectal bleeding, and  rectal pain.     Denies anal fissure, black tarry stools, change in bowel habit, diverticulosis, fecal incontinence, heme positive stool, irritable bowel syndrome, jaundice, light color stool, and  liver problems.    Current Medications (verified): 1)  Metoprolol Succinate 200 Mg  Tb24 (Metoprolol Succinate) .... Take One Tablet Daily 2)  Prazosin Hcl 1 Mg  Caps (Prazosin Hcl) .... Take 2 in Am , 1 in Evening, 1 At Bedtime 3)  Diltiazem Hcl Er Beads 180 Mg  Cp24 (Diltiazem Hcl Er Beads) .... Take One Capsule Daily 4)  Simvastatin 40 Mg Tabs (Simvastatin) .... One Daily 5)  Valium 10 Mg  Tabs (Diazepam) .... 1/2 By Mouth Two Times A Day As Needed 6)  Prempro 0.3-1.5 Mg  Tabs (Conj Estrog-Medroxyprogest Ace) .Marland Kitchen.. 1 By Mouth Once Daily 7)  Adult Aspirin Ec Low Strength 81 Mg Tbec (Aspirin) .... One By Mouth Daily 8)  Oscal 500/200 D-3 500-200 Mg-Unit Tabs (Calcium-Vitamin D) .... Daily 9)  Co-Q  10 Omega-3 Fish Oil  Caps (Coenzyme Q10-Fish Oil-Vit E) .... Daily 10)  Multivitamins  Caps (Multiple Vitamin) .... Daily 11)  Metoprolol Tartrate 25 Mg Tabs (Metoprolol Tartrate) .... One By Mouth As Needed/pill in Pocket 12)  Iron 90 (18 Fe) Mg Tabs (Ferrous Sulfate) .Marland Kitchen.. 1 By Mouth Daily 13)  Osteo Bi-Flex Regular Strength 250-200 Mg Tabs (Glucosamine-Chondroitin) .Marland Kitchen.. 1 By Mouth Daily 14)  Losartan Potassium 100 Mg Tabs (Losartan Potassium) .... One Daily 15)  Avapro 300 Mg Tabs (Irbesartan) .... One Tablet By Mouth Once Daily 16)  Probiotic  Caps (Probiotic Product) .... One Capsule By Mouth Once Daily  Allergies (verified): No Known Drug Allergies  Past History:  Past Medical History: Hypertension Anxiety Osteoarthritis Glucose intolerance MVP Atrial tachycardia Anemia Arthritis  Past Surgical History: Reviewed history from 05/16/2009 and no changes required. Cataract extraction (04/2000) Cataract extraction (03/2002) Carpal tunnel release (09/2004)  Family History: Family History Hypertension Mother deceased at 79 with CHF Father deceased at 69 with either Stroke or Heart problems 1 brother deceased in a tractor accident 2 sisters and 1 brother living No FH of Colon Cancer:  Social History: Retired--  nursing Widowed  Former Smoker Alcohol use-yes: 2-3 per week  Drug use-no Regular exercise-yes  Review of Systems       The patient complains of allergy/sinus, anxiety-new, arthritis/joint pain, back pain, change in vision, headaches-new, heart rhythm changes, swelling of feet/legs, urination - excessive, and urine leakage.  The patient denies anemia, blood in urine, breast changes/lumps, confusion, cough,  coughing up blood, depression-new, fainting, fatigue, fever, hearing problems, heart murmur, itching, menstrual pain, muscle pains/cramps, night sweats, nosebleeds, pregnancy symptoms, shortness of breath, skin rash, sleeping problems, sore throat, swollen lymph  glands, thirst - excessive, urination changes/pain, vision changes, and voice change.         All other systems were reviewed and were negative   Vital Signs:  Patient profile:   77 year old female Height:      58 inches Weight:      110 pounds BMI:     23.07 BSA:     1.41 Pulse rate:   60 / minute Pulse rhythm:   regular BP sitting:   136 / 84  (left arm) Cuff size:   regular  Vitals Entered By: Ok Anis CMA (September 19, 2009 3:08 PM)  Physical Exam  Additional Exam:  She is well-developed well-nourished female  skin: anicteric HEENT: normocephalic; PEERLA; no nasal or pharyngeal abnormalities neck: supple nodes: no cervical lymphadenopathy chest: clear to ausculatation and percussion heart: no murmurs, gallops, or rubs abd: soft, nontender; BS normoactive; no abdominal masses, tenderness, organomegaly rectal: no masses; external hemorrhoids are present ext: no cynanosis, clubbing, edema skeletal: no deformities neuro: oriented x 3; no focal abnormalities    Impression & Recommendations:  Problem # 1:  CONSTIPATION, CHRONIC (ICD-564.09) She most likely has functional constipation.  Limited rectal bleeding is likely hemorrhoidal.  Recommendations #1 fiber supplementation #2 colonoscopy  Risks, alternatives, and complications of the procedure, including bleeding, perforation, and possible need for surgery, were explained to the patient.  Patient's questions were answered.  Problem # 2:  EXTERNAL HEMORRHOIDS (ICD-455.3) This is most likely responsible for her limited bleeding.  Recommend fiber supplementation and  warm soaks.    Patient Instructions: 1)  Colonoscopy and Flexible Sigmoidoscopy brochure given.  2)  Conscious Sedation brochure given.  3)  You will need to call back at your convenience to schedule your colonoscopy  4)  You will be scheduled a Previsit with a nurse to sign all paperwork 5)  The medication list was reviewed and reconciled.  All  changed / newly prescribed medications were explained.  A complete medication list was provided to the patient / caregiver.

## 2010-08-07 NOTE — Progress Notes (Signed)
Summary: Gabrielle Warren REFERRAL  Phone Note Outgoing Call   Call placed by: Magdalen Spatz [facility],  August 23, 2009 10:42 AM Call placed to: Patient Summary of Call: REFERENCE GASTRO REFERRAL.   I JUST S/W PT SHE IS NOW DECLINING GI REFERRAL.  STATES AFTER HER VISIT W/DR. LOWNE & XRAY ON 08-21-2009, SHE DOESN'T WANT TO HAVE NOW.  PATIENT STATES IF HER PROBLEMS DO NOT GET BETTER, SHE MAY DECIDE TO CHANGE HER MIND. Initial call taken by: Magdalen Spatz [facility],  August 23, 2009 10:42 AM

## 2010-08-07 NOTE — Letter (Signed)
Summary: Primary Care Consult Scheduled Letter  [facility]  [address] [place], [place] [address]   Phone: [phone]  Fax: [phone]      09/05/2009 MRN: [id number]  [name] [address] [place], [place]  [address]    Dear Ms. Ortwein,    We have scheduled an appointment for you.  At the recommendation of Dr. Loreen Freud, we have scheduled you a consult with Dr. Arlyce Dice of [facility] on 09-19-2009 at 3:00pm.  Their address is [address], [address]rd floor, [place] [address]. The office phone number is [phone].  If this appointment day and time is not convenient for you, please feel free to call the office of the doctor you are being referred to at the number listed above and reschedule the appointment.    It is important for you to keep your scheduled appointments. We are here to make sure you are given good patient care.   Thank you,    Renee, Patient Care Coordinator [facility]

## 2010-08-07 NOTE — Assessment & Plan Note (Signed)
Summary: Throat issues//ok per Dr.Lowne.... KP   Vital Signs:  Patient profile:   77 year old female Weight:      107.8 pounds O2 Sat:      100 % on Room air Temp:     98.2 degrees F oral Pulse rate:   64 / minute Pulse rhythm:   regular BP sitting:   120 / 60  (left arm) Cuff size:   regular  Vitals Entered By: Almeta Monas CMA Duncan Dull) (May 11, 2010 11:36 AM)  O2 Flow:  Room air CC: c/o hoarseness, lump on the right side of throat/can feel it when she swallows, cough with green sputum, Headache and bilateral ear pressure, URI symptoms   History of Present Illness:       This is an 76 year old woman who presents with URI symptoms.  The symptoms began 4 weeks ago.  The patient complains of nasal congestion, purulent nasal discharge, and sore throat, but denies  productive cough, earache, and sick contacts.  The patient denies fever, low-grade fever (<100.5 degrees), fever of 100.5-103 degrees, fever of 103.1-104 degrees, fever to >104 degrees, stiff neck, dyspnea, wheezing, rash, vomiting, diarrhea, use of an antipyretic, and response to antipyretic.  The patient also reports headache.  The patient denies itchy watery eyes, itchy throat, sneezing, seasonal symptoms, response to antihistamine, muscle aches, and severe fatigue.  The patient denies the following risk factors for Strep sinusitis: unilateral facial pain, unilateral nasal discharge, poor response to decongestant, double sickening, tooth pain, Strep exposure, tender adenopathy, and absence of cough.  Pt has b/l sinus pressure and feels lump in throat.  No otc meds.  + productive cough  Pt is also c/o dry mouth esp at night and is using biotin gum but she can not use that at night.  Current Medications (verified): 1)  Metoprolol Succinate 200 Mg  Tb24 (Metoprolol Succinate) .... Take One Tablet Daily 2)  Prazosin Hcl 1 Mg  Caps (Prazosin Hcl) .... Take 2 in Am , 1 in Evening, 1 At Bedtime 3)  Diltiazem Hcl Er Beads 180 Mg   Cp24 (Diltiazem Hcl Er Beads) .... Take One Capsule Daily 4)  Simvastatin 40 Mg Tabs (Simvastatin) .... One Daily 5)  Valium 10 Mg  Tabs (Diazepam) .... 1/2 By Mouth Two Times A Day As Needed 6)  Prempro 0.3-1.5 Mg  Tabs (Conj Estrog-Medroxyprogest Ace) .Marland Kitchen.. 1 By Mouth Once Daily 7)  Adult Aspirin Ec Low Strength 81 Mg Tbec (Aspirin) .... One By Mouth Daily 8)  Oscal 500/200 D-3 500-200 Mg-Unit Tabs (Calcium-Vitamin D) .... Daily 9)  Co-Q 10 Omega-3 Fish Oil  Caps (Coenzyme Q10-Fish Oil-Vit E) .... Daily 10)  Multivitamins  Caps (Multiple Vitamin) .... Daily 11)  Metoprolol Tartrate 25 Mg Tabs (Metoprolol Tartrate) .... One By Mouth As Needed/pill in Pocket 12)  Iron 90 (18 Fe) Mg Tabs (Ferrous Sulfate) .... 2 By Mouth Daily 13)  Osteo Bi-Flex Regular Strength 250-200 Mg Tabs (Glucosamine-Chondroitin) .Marland Kitchen.. 1 By Mouth Daily 14)  Losartan Potassium 100 Mg Tabs (Losartan Potassium) .... One Daily 15)  Probiotic  Caps (Probiotic Product) .... One Capsule By Mouth Once Daily 16)  Diazepam 10 Mg Tabs (Diazepam) .... 1/2 Tablet By Mouth Twice Daily If Needed 17)  Ceftin 500 Mg Tabs (Cefuroxime Axetil) .Marland Kitchen.. 1 By Mouth Two Times A Day 18)  Nasonex 50 Mcg/act Susp (Mometasone Furoate) .... 2 Sprays Each Nostril Once Daily  Allergies (verified): No Known Drug Allergies  Past History:  Past  Medical History: Last updated: 09/19/2009 Hypertension Anxiety Osteoarthritis Glucose intolerance MVP Atrial tachycardia Anemia Arthritis  Past Surgical History: Last updated: 05/16/2009 Cataract extraction (04/2000) Cataract extraction (03/2002) Carpal tunnel release (09/2004)  Family History: Last updated: 09/19/2009 Family History Hypertension Mother deceased at 33 with CHF Father deceased at 83 with either Stroke or Heart problems 1 brother deceased in a tractor accident 2 sisters and 1 brother living No FH of Colon Cancer:  Social History: Last updated: 09/19/2009 Retired--   nursing Widowed  Former Smoker Alcohol use-yes: 2-3 per week  Drug use-no Regular exercise-yes  Risk Factors: Alcohol Use: 1 (09/26/2008) >5 drinks/d w/in last 3 months: no (09/26/2008) Caffeine Use: no (09/26/2008) Exercise: yes (09/26/2008)  Risk Factors: Smoking Status: never (09/26/2008) Passive Smoke Exposure: no (09/23/2007)  Family History: Reviewed history from 09/19/2009 and no changes required. Family History Hypertension Mother deceased at 4 with CHF Father deceased at 68 with either Stroke or Heart problems 1 brother deceased in a tractor accident 2 sisters and 1 brother living No FH of Colon Cancer:  Social History: Reviewed history from 09/19/2009 and no changes required. Retired--  nursing Widowed  Former Smoker Alcohol use-yes: 2-3 per week  Drug use-no Regular exercise-yes  Review of Systems      See HPI  Physical Exam  General:  Well-developed,well-nourished,in no acute distress; alert,appropriate and cooperative throughout examination Ears:  External ear exam shows no significant lesions or deformities.  Otoscopic examination reveals clear canals, tympanic membranes are intact bilaterally without bulging, retraction, inflammation or discharge. Hearing is grossly normal bilaterally. Nose:  no external deformity, mucosal erythema, L frontal sinus tenderness, L maxillary sinus tenderness, R frontal sinus tenderness, and R maxillary sinus tenderness.   Mouth:  Oral mucosa and oropharynx without lesions or exudates.  Teeth in good repair. Neck:  cervical lymphadenopathy.   Lungs:  Normal respiratory effort, chest expands symmetrically. Lungs are clear to auscultation, no crackles or wheezes. Heart:  Normal rate and regular rhythm. S1 and S2 normal without gallop, murmur, click, rub or other extra sounds. Extremities:  No clubbing, cyanosis, edema, or deformity noted with normal full range of motion of all joints.   Psych:  Cognition and judgment appear  intact. Alert and cooperative with normal attention span and concentration. No apparent delusions, illusions, hallucinations   Impression & Recommendations:  Problem # 1:  SINUSITIS - ACUTE-NOS (ICD-461.9)  Her updated medication list for this problem includes:    Ceftin 500 Mg Tabs (Cefuroxime axetil) .Marland Kitchen... 1 by mouth two times a day    Nasonex 50 Mcg/act Susp (Mometasone furoate) .Marland Kitchen... 2 sprays each nostril once daily  Instructed on treatment. Call if symptoms persist or worsen.   Problem # 2:  COUGH (ICD-786.2)  Orders: T-2 View CXR (71020TC)  Problem # 3:  DRY MOUTH (ICD-527.7) biotin mouthwash d/w dentist  Complete Medication List: 1)  Metoprolol Succinate 200 Mg Tb24 (Metoprolol succinate) .... Take one tablet daily 2)  Prazosin Hcl 1 Mg Caps (Prazosin hcl) .... Take 2 in am , 1 in evening, 1 at bedtime 3)  Diltiazem Hcl Er Beads 180 Mg Cp24 (Diltiazem hcl er beads) .... Take one capsule daily 4)  Simvastatin 40 Mg Tabs (Simvastatin) .... One daily 5)  Valium 10 Mg Tabs (Diazepam) .... 1/2 by mouth two times a day as needed 6)  Prempro 0.3-1.5 Mg Tabs (Conj estrog-medroxyprogest ace) .Marland Kitchen.. 1 by mouth once daily 7)  Adult Aspirin Ec Low Strength 81 Mg Tbec (Aspirin) .... One by mouth  daily 8)  Oscal 500/200 D-3 500-200 Mg-unit Tabs (Calcium-vitamin d) .... Daily 9)  Co-q 10 Omega-3 Fish Oil Caps (Coenzyme q10-fish oil-vit e) .... Daily 10)  Multivitamins Caps (Multiple vitamin) .... Daily 11)  Metoprolol Tartrate 25 Mg Tabs (Metoprolol tartrate) .... One by mouth as needed/pill in pocket 12)  Iron 90 (18 Fe) Mg Tabs (Ferrous sulfate) .... 2 by mouth daily 13)  Osteo Bi-flex Regular Strength 250-200 Mg Tabs (Glucosamine-chondroitin) .Marland Kitchen.. 1 by mouth daily 14)  Losartan Potassium 100 Mg Tabs (Losartan potassium) .... One daily 15)  Probiotic Caps (Probiotic product) .... One capsule by mouth once daily 16)  Diazepam 10 Mg Tabs (Diazepam) .... 1/2 tablet by mouth twice daily if  needed 17)  Ceftin 500 Mg Tabs (Cefuroxime axetil) .Marland Kitchen.. 1 by mouth two times a day 18)  Nasonex 50 Mcg/act Susp (Mometasone furoate) .... 2 sprays each nostril once daily Prescriptions: CEFTIN 500 MG TABS (CEFUROXIME AXETIL) 1 by mouth two times a day  #20 x 0   Entered and Authorized by:   Loreen Freud DO   Signed by:   Loreen Freud DO on 05/11/2010   Method used:   Electronically to        [id number] (retail)       [address]       [place], [place]  [address]       Ph: [phone] or [phone]       Fax: [phone]   RxID:   [id number]    Orders Added: 1)  T-2 View CXR [71020TC] 2)  Est. Patient Level III [76160]

## 2010-08-07 NOTE — Progress Notes (Signed)
Summary: REFILL REQUEST  Phone Note Refill Request Message from:  Pharmacy on March 22, 2010 11:02 AM  Refills Requested: Medication #1:  SIMVASTATIN 40 MG TABS one daily   Dosage confirmed as above?Dosage Confirmed   Supply Requested: 3 months [facility]  Next Appointment Scheduled: NONE Initial call taken by: Lavell Islam,  March 22, 2010 11:03 AM    Prescriptions: SIMVASTATIN 40 MG TABS (SIMVASTATIN) one daily  #90 x 1   Entered by:   Army Fossa CMA   Authorized by:   Loreen Freud DO   Signed by:   Army Fossa CMA on 03/22/2010   Method used:   Faxed to ...       [facility] (mail-order)             , [place]         Ph: [phone]       Fax: [phone]   RxID:   [id number]

## 2010-08-07 NOTE — Progress Notes (Signed)
Summary: calling again/vm not set up yet pfh,rn  Phone Note Call from Patient Call back at Home Phone [phone]   Caller: Patient Summary of Call: calling again about Avapro, still hasn't heard anything Initial call taken by: Migdalia Dk,  July 11, 2009 10:20 AM  Follow-up for Phone Call        unable to reach pt at t he number listed.  NA and the voice mailbox has not been set up yet.  Pam Fleming-Hayes,RN  Additional Follow-up for Phone Call Additional follow up Details #1::        change to losartan 50 mg. Additional Follow-up by: Rollene Rotunda, MD, [facility],  July 11, 2009 6:44 PM    Additional Follow-up for Phone Call Additional follow up Details #2::    Finally able to get in touch with pt.  pt aware she can change to Losartan - she states she did just fill her Avapro but will get a new rx when she comes to see Dr Antoine Poche in March 2011. Follow-up by: Charolotte Capuchin, RN,  July 26, 2009 12:49 PM

## 2010-08-07 NOTE — Assessment & Plan Note (Signed)
Summary: FLU SHOT///SPH  Nurse Visit   Allergies: No Known Drug Allergies  Orders Added: 1)  Flu Vaccine 31yrs + MEDICARE PATIENTS [Q2039] 2)  Administration Flu vaccine - MCR [G0008] Flu Vaccine Consent Questions     Do you have a history of severe allergic reactions to this vaccine? no    Any prior history of allergic reactions to egg and/or gelatin? no    Do you have a sensitivity to the preservative Thimersol? no    Do you have a past history of Guillan-Barre Syndrome? no    Do you currently have an acute febrile illness? no    Have you ever had a severe reaction to latex? no    Vaccine information given and explained to patient? yes    Are you currently pregnant? no    Lot Number:[id number]   Exp Date:01/05/2011   Site Given  Left Deltoid [email or web address].lbmedflu

## 2010-08-07 NOTE — Progress Notes (Signed)
Summary: GI Referral   Phone Note Call from Patient Call back at Home Phone [phone]   Caller: Patient Reason for Call: Referral Summary of Call: Patient would now like to continue with her GI referral.  Initial call taken by: Harold Barban,  September 05, 2009 9:52 AM  Follow-up for Phone Call        done Follow-up by: Loreen Freud DO,  September 05, 2009 11:42 AM

## 2010-08-07 NOTE — Assessment & Plan Note (Signed)
Summary: discuss referral/drb   Vital Signs:  Patient profile:   77 year old female Weight:      106 pounds Temp:     98.3 degrees F oral Pulse rate:   60 / minute Pulse rhythm:   regular BP sitting:   136 / 70  (left arm) Cuff size:   regular  Vitals Entered By: Army Fossa CMA (August 21, 2009 12:44 PM) CC: Discuss having a colonscopy. - Pt states she is having trouble having bowel movements., Constipation   History of Present Illness:       This is an 77 year old female who presents with Constipation.  The symptoms began 6-12 months ago.  Pt in past took MOM with good results but recently only watery stools come out no formed stools.  The patient reports intermittent loose stools, but denies hard stools, rectal pain, abdominal pain, cramping, hematochezia, melena, hemorrhoids, nausea, vomiting, infrequent bowel movements, rectal seepage, tenesmus, and mucousy stools.  The patient denies the following symptoms: fever, weight gain, cold intolerance, back pain, pelvic pain, urinary retention, joint pain, numbness, perineal sensory change, and sexual dysfunction.  Ineffective treatments have included dietary changes, prunes, fiber supplements, stool softeners, and OTC laxatives.    Allergies (verified): No Known Drug Allergies  Past History:  Past medical, surgical, family and social histories (including risk factors) reviewed for relevance to current acute and chronic problems.  Past Medical History: Reviewed history from 09/20/2008 and no changes required. Hypertension Anxiety Osteoarthritis Glucose intolerance MVP Atrial tachycardia  Past Surgical History: Reviewed history from 05/16/2009 and no changes required. Cataract extraction (04/2000) Cataract extraction (03/2002) Carpal tunnel release (09/2004)  Family History: Reviewed history from 09/20/2008 and no changes required. Family History Hypertension   Mother deceased at 42 with CHF Father deceased at 46 with  either Stroke or Heart problems 1 brother deceased in a tractor accident 2 sisters and 1 brother living  Social History: Reviewed history from 09/20/2008 and no changes required. Retired--  nursing Former Smoker Alcohol use-yes Drug use-no Regular exercise-yes Married   Review of Systems      See HPI  Physical Exam  General:  Well-developed,well-nourished,in no acute distress; alert,appropriate and cooperative throughout examination Abdomen:  soft, no masses, no guarding, no rigidity, no rebound tenderness, no abdominal hernia, and bowel sounds hypoactive.   Rectal:  external hemorrhoid(s).  heme neg brown-soft stool  Psych:  Oriented X3 and normally interactive.     Impression & Recommendations:  Problem # 1:  CONSTIPATION, CHRONIC (ICD-564.09) miralax  Orders: Gastroenterology Referral (GI) Venipuncture (40347) TLB-TSH (Thyroid Stimulating Hormone) (84443-TSH) TLB-CBC Platelet - w/Differential (85025-CBCD) T-Abdomen 2-view (74020TC)  Discussed dietary fiber measures and increased water intake.   Complete Medication List: 1)  Metoprolol Succinate 200 Mg Tb24 (Metoprolol succinate) .... Take one tablet daily 2)  Avapro 300 Mg Tabs (Irbesartan) .Marland Kitchen.. 1 by mouth once daily 3)  Prazosin Hcl 1 Mg Caps (Prazosin hcl) .... Take 2 in am , 1 in evening, 1 at bedtime 4)  Diltiazem Hcl Er Beads 180 Mg Cp24 (Diltiazem hcl er beads) .... Take one capsule daily 5)  Simvastatin 40 Mg Tabs (Simvastatin) .... One daily 6)  Valium 10 Mg Tabs (Diazepam) .... 1/2 by mouth two times a day as needed 7)  Prempro 0.3-1.5 Mg Tabs (Conj estrog-medroxyprogest ace) .Marland Kitchen.. 1 by mouth once daily 8)  Adult Aspirin Ec Low Strength 81 Mg Tbec (Aspirin) .... One by mouth daily 9)  Oscal 500/200 D-3 500-200 Mg-unit Tabs (Calcium-vitamin  d) .... Daily 10)  Co-q 10 Omega-3 Fish Oil Caps (Coenzyme q10-fish oil-vit e) .... Daily 11)  Glucosamine-chondroitin 1500-1200 Mg/65ml Liqd (Glucosamine-chondroitin)  .... Daily 12)  Multivitamins Caps (Multiple vitamin) .... Daily 13)  Metoprolol Tartrate 25 Mg Tabs (Metoprolol tartrate) .... One by mouth as needed/pill in pocket

## 2010-08-07 NOTE — Letter (Signed)
Summary: New Patient letter  [facility]  [address] [place], [place] [address]   Phone: [phone]  Fax: [phone]       09/05/2009 MRN: [id number]  Gabrielle Warren [address] [place], [place]  [address]  Dear Ms. Hulgan,  Welcome to the Gastroenterology Division at [facility].    You are scheduled to see Dr. Melvia Heaps on September 19, 2009 at 3:00pm on the 3rd floor at [facility], [address]  We ask that you try to arrive at our office 15 minutes prior to your appointment time to allow for check-in.  We would like you to complete the enclosed self-administered evaluation form prior to your visit and bring it with you on the day of your appointment.  We will review it with you.  Also, please bring a complete list of all your medications or, if you prefer, bring the medication bottles and we will list them.  Please bring your insurance card so that we may make a copy of it.  If your insurance requires a referral to see a specialist, please bring your referral form from your primary care physician.  Co-payments are due at the time of your visit and may be paid by cash, check or credit card.     Your office visit will consist of a consult with your physician (includes a physical exam), any laboratory testing he/she may order, scheduling of any necessary diagnostic testing (e.g. x-ray, ultrasound, CT-scan), and scheduling of a procedure (e.g. Endoscopy, Colonoscopy) if required.  Please allow enough time on your schedule to allow for any/all of these possibilities.    If you cannot keep your appointment, please call [phone] to cancel or reschedule prior to your appointment date.  This allows [place] the opportunity to schedule an appointment for another patient in need of care.  If you do not cancel or reschedule by 5 p.m. the business day prior to your appointment date, you will be charged a $50.00 late cancellation/no-show fee.    Thank you  for choosing [facility] for your medical needs.  We appreciate the opportunity to care for you.  Please visit [place] at our website  to learn more about our practice.                     Sincerely,                                                             The Gastroenterology Division

## 2010-08-07 NOTE — Progress Notes (Signed)
Summary: FELL AND REQUEST X-RAY  Phone Note Call from Patient   Caller: Patient Call For: Loreen Freud DO Details for Reason: Request X-ray Summary of Call: pt fell on Saturday on her right side and hit the right lower rib area, says she has some bruising and when she takes a deep breath it hurts really bad, wants to know if we can order and X-ray since she is going to [place] for a chest x-ray. Pt has osteoporosis.Marland KitchenMarland KitchenC/B # [id number].  Please advise.... Initial call taken by: Almeta Monas CMA Duncan Dull),  May 28, 2010 10:59 AM  Follow-up for Phone Call        add r rib --- order put in but she will need to let them know we added it so they check computer Follow-up by: Loreen Freud DO,  May 28, 2010 11:04 AM  Additional Follow-up for Phone Call Additional follow up Details #1::        pt aware....  Additional Follow-up by: Almeta Monas CMA Duncan Dull),  May 28, 2010 11:16 AM  New Problems: RIB PAIN, RIGHT SIDED (ICD-786.50)   New Problems: RIB PAIN, RIGHT SIDED (ICD-786.50)

## 2010-08-07 NOTE — Assessment & Plan Note (Signed)
Summary: 6 month rov 401.1 palps  pfh,rn   Visit Type:  Follow-up Primary Provider:  Loreen Freud, DO  CC:  HTN.  History of Present Illness: The patient presents for followup of her difficult to control hypertension. Since I last saw her she has had no new cardiovascular complaints. She has had very few palpitations. She is otherwise feeling pretty well. She brings her blood pressure diary and her blood pressures are well controlled. She has had no new shortness of breath, PND or orthopnea. She tries to remain active in her garden.  Current Medications (verified): 1)  Metoprolol Succinate 200 Mg  Tb24 (Metoprolol Succinate) .... Take One Tablet Daily 2)  Prazosin Hcl 1 Mg  Caps (Prazosin Hcl) .... Take 2 in Am , 1 in Evening, 1 At Bedtime 3)  Diltiazem Hcl Er Beads 180 Mg  Cp24 (Diltiazem Hcl Er Beads) .... Take One Capsule Daily 4)  Simvastatin 40 Mg Tabs (Simvastatin) .... One Daily 5)  Valium 10 Mg  Tabs (Diazepam) .... 1/2 By Mouth Two Times A Day As Needed 6)  Prempro 0.3-1.5 Mg  Tabs (Conj Estrog-Medroxyprogest Ace) .Marland Kitchen.. 1 By Mouth Once Daily 7)  Adult Aspirin Ec Low Strength 81 Mg Tbec (Aspirin) .... One By Mouth Daily 8)  Oscal 500/200 D-3 500-200 Mg-Unit Tabs (Calcium-Vitamin D) .... Daily 9)  Co-Q 10 Omega-3 Fish Oil  Caps (Coenzyme Q10-Fish Oil-Vit E) .... Daily 10)  Multivitamins  Caps (Multiple Vitamin) .... Daily 11)  Metoprolol Tartrate 25 Mg Tabs (Metoprolol Tartrate) .... One By Mouth As Needed/pill in Pocket 12)  Iron 90 (18 Fe) Mg Tabs (Ferrous Sulfate) .... 2 By Mouth Daily 13)  Osteo Bi-Flex Regular Strength 250-200 Mg Tabs (Glucosamine-Chondroitin) .Marland Kitchen.. 1 By Mouth Daily 14)  Losartan Potassium 100 Mg Tabs (Losartan Potassium) .... One Daily 15)  Probiotic  Caps (Probiotic Product) .... One Capsule By Mouth Once Daily 16)  Diazepam 10 Mg Tabs (Diazepam) .... 1/2 Tablet By Mouth Twice Daily If Needed  Allergies (verified): No Known Drug Allergies  Past  History:  Past Medical History: Reviewed history from 09/19/2009 and no changes required. Hypertension Anxiety Osteoarthritis Glucose intolerance MVP Atrial tachycardia Anemia Arthritis  Past Surgical History: Reviewed history from 05/16/2009 and no changes required. Cataract extraction (04/2000) Cataract extraction (03/2002) Carpal tunnel release (09/2004)  Review of Systems       As stated in the HPI and negative for all other systems.   Vital Signs:  Patient profile:   77 year old female Height:      58 inches Weight:      109 pounds BMI:     22.86 Pulse rate:   52 / minute Pulse rhythm:   regular BP sitting:   160 / 96  (left arm) Cuff size:   regular  Vitals Entered By: Laurance Flatten CMA (March 20, 2010 4:33 PM)  Physical Exam  General:  Well developed, well nourished, in no acute distress. Head:  normocephalic and atraumatic Eyes:  PERRLA/EOM intact; conjunctiva and lids normal. Mouth:  Teeth, gums and palate normal. Oral mucosa normal. Neck:  Neck supple, no JVD. No masses, thyromegaly or abnormal cervical nodes. Chest Wall:  no deformities or breast masses noted Lungs:  Clear bilaterally to auscultation and percussion. Heart:  Non-displaced PMI, chest non-tender; regular rate and rhythm, S1, S2 without murmurs, rubs or gallops. Carotid upstroke normal, no bruit. Normal abdominal aortic size, no bruits. Femorals normal pulses, no bruits. Pedals normal pulses. No edema, no varicosities. Abdomen:  Bowel sounds positive; abdomen soft and non-tender without masses, organomegaly, or hernias noted. No hepatosplenomegaly. Msk:  Back normal, normal gait. Muscle strength and tone normal. Extremities:  No clubbing or cyanosis. Neurologic:  Alert and oriented x 3. Skin:  Intact without lesions or rashes. Cervical Nodes:  no significant adenopathy Psych:  Normal affect.   EKG  Procedure date:  03/20/2010  Findings:      sinus rhythm, rate 52, axis within normal  limits, intervals within normal limits, no acute ST-T wave changes  Impression & Recommendations:  Problem # 1:  HYPERTENSION (ICD-401.9) The patient's blood pressure is well controlled. She will continue the meds as listed.  Problem # 2:  PALPITATIONS (ICD-785.1) Her palpitations are no longer bothersome. She will continue the meds as listed. No further evaluation is warranted. Orders: EKG w/ Interpretation (93000)  Patient Instructions: 1)  Your physician recommends that you schedule a follow-up appointment in: 6 months with Dr Antoine Poche 2)  Your physician recommends that you continue on your current medications as directed. Please refer to the Current Medication list given to you today.

## 2010-08-07 NOTE — Assessment & Plan Note (Signed)
Summary: f67m   Visit Type:  Follow-up Primary Provider:  Dr. Laury Axon  CC:  hypertension.  History of Present Illness: The patient presents for followup of difficult to control hypertension. She brings a blood pressure diary which I reviewed. This is actually well controlled. She is tolerating current regimen of medications. There is a question of whether we can change her to a generic ARB and I will address this. She has had occasional palpitations. However, these have been short lived. On one occasion she took an extra dose of beta blocker. Otherwise she's not having any presyncope or syncope. She has no new shortness of breath or chest pain.  Current Medications (verified): 1)  Metoprolol Succinate 200 Mg  Tb24 (Metoprolol Succinate) .... Take One Tablet Daily 2)  Prazosin Hcl 1 Mg  Caps (Prazosin Hcl) .... Take 2 in Am , 1 in Evening, 1 At Bedtime 3)  Diltiazem Hcl Er Beads 180 Mg  Cp24 (Diltiazem Hcl Er Beads) .... Take One Capsule Daily 4)  Simvastatin 40 Mg Tabs (Simvastatin) .... One Daily 5)  Valium 10 Mg  Tabs (Diazepam) .... 1/2 By Mouth Two Times A Day As Needed 6)  Prempro 0.3-1.5 Mg  Tabs (Conj Estrog-Medroxyprogest Ace) .Marland Kitchen.. 1 By Mouth Once Daily 7)  Adult Aspirin Ec Low Strength 81 Mg Tbec (Aspirin) .... One By Mouth Daily 8)  Oscal 500/200 D-3 500-200 Mg-Unit Tabs (Calcium-Vitamin D) .... Daily 9)  Co-Q 10 Omega-3 Fish Oil  Caps (Coenzyme Q10-Fish Oil-Vit E) .... Daily 10)  Glucosamine-Chondroitin 1500-1200 Mg/29ml Liqd (Glucosamine-Chondroitin) .... Daily 11)  Multivitamins  Caps (Multiple Vitamin) .... Daily 12)  Metoprolol Tartrate 25 Mg Tabs (Metoprolol Tartrate) .... One By Mouth As Needed/pill in Pocket 13)  Iron 90 (18 Fe) Mg Tabs (Ferrous Sulfate) .Marland Kitchen.. 1 By Mouth Daily 14)  Osteo Bi-Flex Regular Strength 250-200 Mg Tabs (Glucosamine-Chondroitin) .Marland Kitchen.. 1 By Mouth Daily  Allergies (verified): No Known Drug Allergies  Past History:  Past Medical History: Reviewed  history from 09/20/2008 and no changes required. Hypertension Anxiety Osteoarthritis Glucose intolerance MVP Atrial tachycardia  Past Surgical History: Reviewed history from 05/16/2009 and no changes required. Cataract extraction (04/2000) Cataract extraction (03/2002) Carpal tunnel release (09/2004)  Review of Systems       As stated in the HPI and negative for all other systems.   Vital Signs:  Patient profile:   77 year old female Height:      58 inches Weight:      109 pounds BMI:     22.86 Pulse rate:   52 / minute Resp:     16 per minute BP sitting:   182 / 84  (right arm)  Vitals Entered By: Marrion Coy, CNA (September 11, 2009 4:26 PM)  Physical Exam  General:  Well developed, well nourished, in no acute distress. Head:  normocephalic and atraumatic Eyes:  PERRLA/EOM intact; conjunctiva and lids normal. Mouth:  Teeth, gums and palate normal. Oral mucosa normal. Neck:  Neck supple, no JVD. No masses, thyromegaly or abnormal cervical nodes. Chest Wall:  no deformities or breast masses noted Lungs:  Clear bilaterally to auscultation and percussion. Abdomen:  Bowel sounds positive; abdomen soft and non-tender without masses, organomegaly, or hernias noted. No hepatosplenomegaly. Msk:  Back normal, normal gait. Muscle strength and tone normal. Extremities:  No clubbing or cyanosis. Neurologic:  Alert and oriented x 3. Skin:  Intact without lesions or rashes. Psych:  Normal affect.   Detailed Cardiovascular Exam  Neck  Carotids: Carotids full and equal bilaterally without bruits.      Neck Veins: Normal, no JVD.    Heart    Palpation: normal PMI with no thrills palpable.      Auscultation: regular rate and rhythm, S1, S2 without murmurs, rubs, gallops, or clicks.    Vascular    Abdominal Aorta: no palpable masses, pulsations, or audible bruits.      Femoral Pulses: normal femoral pulses bilaterally.      Pedal Pulses: normal pedal pulses bilaterally.       Radial Pulses: normal radial pulses bilaterally.      Peripheral Circulation: no clubbing, cyanosis, or edema noted with normal capillary refill.     EKG  Procedure date:  09/11/2009  Findings:      sinus bradycardia with sinus arrhythmia, axis within normal limits, intervals within normal limits, no acute ST-T wave changes.  Impression & Recommendations:  Problem # 1:  PALPITATIONS (ICD-785.1) We discussed these at length. These are relatively asymptomatic. At this point no change in therapy is indicated. Orders: EKG w/ Interpretation (93000)  Problem # 2:  HYPERTENSION (ICD-401.9)  At this she is on a good medical regimen. Because of cost I will go ahead and switch her to losartan 100 mg daily in place of the  Avapro.  She might need further manage adjustments as we make this transition period  Problem # 3:  HYPERLIPIDEMIA (ICD-272.4) She can have this followed by her primary care physician.  Patient Instructions: 1)  Your physician recommends that you schedule a follow-up appointment in: 6 months with Dr Antoine Poche 2)  Your physician recommends that you continue on your current medications as directed. Please refer to the Current Medication list given to you today. 3)  You have been diagnosed with palpitations. Palpitations are described as a gradual or sudden awareness of the beating of your heart. It may last seconds, minutes, hours, or days and may be caused by the heart beating slower, faster, more strongly, or more irregularly than normal. They are very common and usually not dangerous. Please see the handout/brochure given to you today for more information.

## 2010-08-09 NOTE — Progress Notes (Signed)
Summary: Refill Request  Phone Note Refill Request Message from:  Patient on July 17, 2010 3:47 PM  Refills Requested: Medication #1:  SIMVASTATIN 40 MG TABS one daily  Medication #2:  PRAZOSIN HCL 1 MG  CAPS take 2 in am  Medication #3:  PREMPRO 0.3-1.5 MG  TABS 1 by mouth once daily  Medication #4:  METOPROLOL SUCCINATE 200 MG  TB24 TAKE ONE TABLET DAILY dilitiazem for 90days supply to [facility], pt would like to have the RX mailed to her individually.  Initial call taken by: Almeta Monas CMA Duncan Dull),  July 17, 2010 3:47 PM    Prescriptions: PREMPRO 0.3-1.5 MG  TABS (CONJ ESTROG-MEDROXYPROGEST ACE) 1 by mouth once daily  #90 x 3   Entered by:   Almeta Monas CMA (AAMA)   Authorized by:   Loreen Freud DO   Signed by:   Almeta Monas CMA (AAMA) on 07/17/2010   Method used:   Print then Give to Patient   RxID:   [id number] SIMVASTATIN 40 MG TABS (SIMVASTATIN) one daily  #90 x 1   Entered by:   Almeta Monas CMA (AAMA)   Authorized by:   Loreen Freud DO   Signed by:   Almeta Monas CMA (AAMA) on 07/17/2010   Method used:   Print then Give to Patient   RxID:   [id number] DILTIAZEM HCL ER BEADS 180 MG  CP24 (DILTIAZEM HCL ER BEADS) take one capsule daily  #90 x 3   Entered by:   Almeta Monas CMA (AAMA)   Authorized by:   Loreen Freud DO   Signed by:   Almeta Monas CMA (AAMA) on 07/17/2010   Method used:   Print then Give to Patient   RxID:   [id number] PRAZOSIN HCL 1 MG  CAPS (PRAZOSIN HCL) take 2 in am , 1 in evening, 1 at bedtime  #360 x 3   Entered by:   Almeta Monas CMA (AAMA)   Authorized by:   Loreen Freud DO   Signed by:   Almeta Monas CMA (AAMA) on 07/17/2010   Method used:   Print then Give to Patient   RxID:   [id number] METOPROLOL SUCCINATE 200 MG  TB24 (METOPROLOL SUCCINATE) TAKE ONE TABLET DAILY  #90 x 3   Entered by:   Almeta Monas CMA (AAMA)   Authorized by:   Loreen Freud DO   Signed by:   Almeta Monas CMA (AAMA) on  07/17/2010   Method used:   Print then Give to Patient   RxID:   [id number]

## 2010-08-09 NOTE — Progress Notes (Signed)
Summary: rx refill  Phone Note Refill Request   Refills Requested: Medication #1:  LOSARTAN POTASSIUM 100 MG TABS one daily pt would prescription to be mail to her   Method Requested: Mail to Patient Initial call taken by: Roe Coombs,  July 25, 2010 10:56 AM  Follow-up for Phone Call        RX will be mailed to pt. Pt  notified. Marrion Coy, CNA  July 26, 2010 8:49 AM  Follow-up by: Marrion Coy, CNA,  July 26, 2010 8:49 AM    Prescriptions: LOSARTAN POTASSIUM 100 MG TABS (LOSARTAN POTASSIUM) one daily  #90 x 3   Entered by:   Marrion Coy, CNA   Authorized by:   Rollene Rotunda, MD, [facility]   Signed by:   Marrion Coy, CNA on 07/26/2010   Method used:   Print then Give to Patient   RxID:   [id number]

## 2010-08-09 NOTE — Progress Notes (Signed)
Summary: Review results  Phone Note Call from Patient   Caller: Patient Call For: Loreen Freud DO Details for Reason: Questions about results Summary of Call: spoke with patient and she wanted to review labs, advised cont meds and increase Vit D to 2000 units daily...Marland KitchenMarland Kitchen she voiced understanding. Almeta Monas CMA Duncan Dull)  June 26, 2010 5:06 PM  Initial call taken by: Almeta Monas CMA Duncan Dull),  June 26, 2010 5:05 PM

## 2010-08-09 NOTE — Assessment & Plan Note (Signed)
Summary: cpx & lab/cbs   Vital Signs:  Patient profile:   77 year old female Height:      58 inches Weight:      107.4 pounds Temp:     97.8 degrees F oral Pulse rate:   60 / minute Pulse rhythm:   regular BP sitting:   116 / 72  (right arm) Cuff size:   regular  Vitals Entered By: Almeta Monas CMA Duncan Dull) (June 15, 2010 8:37 AM) CC: CPX-Fasting, still having sinus issues and needs RF of Valium  Does patient need assistance? Functional Status Self care, Cook/clean, Shopping, Social activities Ambulation Normal Comments pt able to do all ADLs and can read and write   Vision Screening:      Vision Comments: optho--q1y--Dr Nile Riggs 40db HL: Left  Right  Audiometry Comment: grossly normal    History of Present Illness: Pt here for cpe.  Gyn exam by Dr Jackelyn Knife.   cardio -- Dr Antoine Poche Dr Meisinger--gyn optho--Dr Nile Riggs Dentist--Dr Edward Jolly  Preventive Screening-Counseling & Management  Alcohol-Tobacco     Alcohol drinks/day: 1     Alcohol type: all     >5/day in last 3 mos: no     Smoking Status: never     Year Quit: 1990     Pack years: 1 pack a week X 41 years     Passive Smoke Exposure: no  Caffeine-Diet-Exercise     Caffeine use/day: no     Does Patient Exercise: yes     Type of exercise: walking     Exercise (avg: min/session): 45 min,  20-30 floor exercises     Times/week: 5     Exercise Counseling: not indicated; exercise is adequate  Hep-HIV-STD-Contraception     Hepatitis Risk: no risk noted     HIV Risk: no risk noted     STD Risk Counseling: not indicated-no STD risk noted     Dental Visit-last 6 months yes     Dental Care Counseling: not indicated; dental care within six months     SBE monthly: yes     SBE Education/Counseling: not indicated; SBE done regularly  Safety-Violence-Falls     Seat Belt Use: yes     Firearms in the Home: no firearms in the home     Smoke Detectors: yes     Violence in the Home: no risk noted     Sexual  Abuse: no     Fall Risk: no  Current Medications (verified): 1)  Metoprolol Succinate 200 Mg  Tb24 (Metoprolol Succinate) .... Take One Tablet Daily 2)  Prazosin Hcl 1 Mg  Caps (Prazosin Hcl) .... Take 2 in Am , 1 in Evening, 1 At Bedtime 3)  Diltiazem Hcl Er Beads 180 Mg  Cp24 (Diltiazem Hcl Er Beads) .... Take One Capsule Daily 4)  Simvastatin 40 Mg Tabs (Simvastatin) .... One Daily 5)  Valium 10 Mg  Tabs (Diazepam) .... 1/2 By Mouth Two Times A Day As Needed 6)  Prempro 0.3-1.5 Mg  Tabs (Conj Estrog-Medroxyprogest Ace) .Marland Kitchen.. 1 By Mouth Once Daily 7)  Adult Aspirin Ec Low Strength 81 Mg Tbec (Aspirin) .... One By Mouth Daily 8)  Oscal 500/200 D-3 500-200 Mg-Unit Tabs (Calcium-Vitamin D) .... Daily 9)  Co-Q 10 Omega-3 Fish Oil  Caps (Coenzyme Q10-Fish Oil-Vit E) .... Daily 10)  Multivitamins  Caps (Multiple Vitamin) .... Daily 11)  Metoprolol Tartrate 25 Mg Tabs (Metoprolol Tartrate) .... One By Mouth As Needed/pill in Pocket 12)  Iron 90 (  18 Fe) Mg Tabs (Ferrous Sulfate) .... 2 By Mouth Daily 13)  Osteo Bi-Flex Regular Strength 250-200 Mg Tabs (Glucosamine-Chondroitin) .Marland Kitchen.. 1 By Mouth Daily 14)  Losartan Potassium 100 Mg Tabs (Losartan Potassium) .... One Daily 15)  Probiotic  Caps (Probiotic Product) .... One Capsule By Mouth Once Daily 16)  Diazepam 10 Mg Tabs (Diazepam) .... 1/2 Tablet By Mouth Twice Daily If Needed 17)  Nasonex 50 Mcg/act Susp (Mometasone Furoate) .... 2 Sprays Each Nostril Once Daily 18)  Zostavax [address] Unt/0.8ml Solr (Zoster Vaccine Live) .Marland Kitchen.. 1 Ml Im X1  Allergies (verified): No Known Drug Allergies  Past History:  Past Medical History: Last updated: 09/19/2009 Hypertension Anxiety Osteoarthritis Glucose intolerance MVP Atrial tachycardia Anemia Arthritis  Past Surgical History: Last updated: 05/16/2009 Cataract extraction (04/2000) Cataract extraction (03/2002) Carpal tunnel release (09/2004)  Family History: Last updated: 09/19/2009 Family  History Hypertension Mother deceased at 57 with CHF Father deceased at 38 with either Stroke or Heart problems 1 brother deceased in a tractor accident 2 sisters and 1 brother living No FH of Colon Cancer:  Social History: Last updated: 09/19/2009 Retired--  nursing Widowed  Former Smoker Alcohol use-yes: 2-3 per week  Drug use-no Regular exercise-yes  Risk Factors: Alcohol Use: 1 (06/15/2010) >5 drinks/d w/in last 3 months: no (06/15/2010) Caffeine Use: no (06/15/2010) Exercise: yes (06/15/2010)  Risk Factors: Smoking Status: never (06/15/2010) Passive Smoke Exposure: no (06/15/2010)  Family History: Reviewed history from 09/19/2009 and no changes required. Family History Hypertension Mother deceased at 23 with CHF Father deceased at 31 with either Stroke or Heart problems 1 brother deceased in a tractor accident 2 sisters and 1 brother living No FH of Colon Cancer:  Social History: Reviewed history from 09/19/2009 and no changes required. Retired--  nursing Widowed  Former Smoker Alcohol use-yes: 2-3 per week  Drug use-no Regular exercise-yes  Fall Risk:  no  Review of Systems      See HPI  Physical Exam  General:  Well-developed,well-nourished,in no acute distress; alert,appropriate and cooperative throughout examination Head:  Normocephalic and atraumatic without obvious abnormalities. No apparent alopecia or balding. Eyes:  pupils equal, pupils round, pupils reactive to light, and no injection.   Ears:  External ear exam shows no significant lesions or deformities.  Otoscopic examination reveals clear canals, tympanic membranes are intact bilaterally without bulging, retraction, inflammation or discharge. Hearing is grossly normal bilaterally. Nose:  External nasal examination shows no deformity or inflammation. Nasal mucosa are pink and moist without lesions or exudates. Mouth:  Oral mucosa and oropharynx without lesions or exudates.  Teeth in good  repair. Neck:  No deformities, masses, or tenderness noted. Chest Wall:  No deformities, masses, or tenderness noted. Breasts:  No mass, nodules, thickening, tenderness, bulging, retraction, inflamation, nipple discharge or skin changes noted.   Lungs:  Normal respiratory effort, chest expands symmetrically. Lungs are clear to auscultation, no crackles or wheezes. Heart:  normal rate and no murmur.   Abdomen:  Bowel sounds positive,abdomen soft and non-tender without masses, organomegaly or hernias noted. Msk:  normal ROM, no joint tenderness, no joint swelling, no joint warmth, no redness over joints, no joint instability, and no crepitation.   Pulses:  R and L carotid,radial,femoral,dorsalis pedis and posterior tibial pulses are full and equal bilaterally Extremities:  No clubbing, cyanosis, edema, or deformity noted with normal full range of motion of all joints.   Neurologic:  No cranial nerve deficits noted. Station and gait are normal. Plantar reflexes are down-going bilaterally. DTRs  are symmetrical throughout. Sensory, motor and coordinative functions appear intact. Skin:  Intact without suspicious lesions or rashes Cervical Nodes:  No lymphadenopathy noted Axillary Nodes:  No palpable lymphadenopathy Psych:  Cognition and judgment appear intact. Alert and cooperative with normal attention span and concentration. No apparent delusions, illusions, hallucinations   Impression & Recommendations:  Problem # 1:  PREVENTIVE HEALTH CARE (ICD-V70.0)  Orders: Venipuncture (19147) TLB-Lipid Panel (80061-LIPID) TLB-BMP (Basic Metabolic Panel-BMET) (80048-METABOL) TLB-CBC Platelet - w/Differential (85025-CBCD) TLB-Hepatic/Liver Function Pnl (80076-HEPATIC) T-Vitamin D (25-Hydroxy) (82956-21308) Specimen Handling (65784) Medicare -1st Annual Wellness Visit [phone]) EKG w/ Interpretation (93000)  Problem # 2:  HYPERLIPIDEMIA (ICD-272.4)  Her updated medication list for this problem  includes:    Simvastatin 40 Mg Tabs (Simvastatin) ..... One daily  Orders: Venipuncture (52841) TLB-Lipid Panel (80061-LIPID) TLB-BMP (Basic Metabolic Panel-BMET) (80048-METABOL) TLB-CBC Platelet - w/Differential (85025-CBCD) TLB-Hepatic/Liver Function Pnl (80076-HEPATIC) T-Vitamin D (25-Hydroxy) (32440-10272) Specimen Handling (53664)  Problem # 3:  PALPITATIONS (ICD-785.1)  Her updated medication list for this problem includes:    Metoprolol Succinate 200 Mg Tb24 (Metoprolol succinate) .Marland Kitchen... Take one tablet daily    Metoprolol Tartrate 25 Mg Tabs (Metoprolol tartrate) ..... One by mouth as needed/pill in pocket  Problem # 4:  OTHER OSTEOPOROSIS (ICD-733.09)  Her updated medication list for this problem includes:    Oscal 500/200 D-3 500-200 Mg-unit Tabs (Calcium-vitamin d) .Marland Kitchen... Daily  Orders: Venipuncture (40347) TLB-Lipid Panel (80061-LIPID) TLB-BMP (Basic Metabolic Panel-BMET) (80048-METABOL) TLB-CBC Platelet - w/Differential (85025-CBCD) TLB-Hepatic/Liver Function Pnl (80076-HEPATIC) T-Vitamin D (25-Hydroxy) (42595-63875) Specimen Handling (64332) Radiology Referral (Radiology)  Problem # 5:  IMPAIRED GLUCOSE TOLERANCE (ICD-271.3)  Orders: Venipuncture (95188) TLB-Lipid Panel (80061-LIPID) TLB-BMP (Basic Metabolic Panel-BMET) (80048-METABOL) TLB-CBC Platelet - w/Differential (85025-CBCD) TLB-Hepatic/Liver Function Pnl (80076-HEPATIC) T-Vitamin D (25-Hydroxy) (41660-63016) Specimen Handling (01093)  Problem # 6:  OSTEOARTHRITIS (ICD-715.90)  Her updated medication list for this problem includes:    Adult Aspirin Ec Low Strength 81 Mg Tbec (Aspirin) ..... One by mouth daily  Problem # 7:  ANXIETY (ICD-300.00)  Her updated medication list for this problem includes:    Valium 10 Mg Tabs (Diazepam) .Marland Kitchen... 1/2 by mouth two times a day as needed    Diazepam 10 Mg Tabs (Diazepam) .Marland Kitchen... 1/2 tablet by mouth twice daily if needed  Discussed medication use and  relaxation techniques.   Problem # 8:  HYPERTENSION (ICD-401.9)  Her updated medication list for this problem includes:    Metoprolol Succinate 200 Mg Tb24 (Metoprolol succinate) .Marland Kitchen... Take one tablet daily    Prazosin Hcl 1 Mg Caps (Prazosin hcl) .Marland Kitchen... Take 2 in am , 1 in evening, 1 at bedtime    Diltiazem Hcl Er Beads 180 Mg Cp24 (Diltiazem hcl er beads) .Marland Kitchen... Take one capsule daily    Metoprolol Tartrate 25 Mg Tabs (Metoprolol tartrate) ..... One by mouth as needed/pill in pocket    Losartan Potassium 100 Mg Tabs (Losartan potassium) ..... One daily  Orders: Venipuncture (23557) TLB-Lipid Panel (80061-LIPID) TLB-BMP (Basic Metabolic Panel-BMET) (80048-METABOL) TLB-CBC Platelet - w/Differential (85025-CBCD) TLB-Hepatic/Liver Function Pnl (80076-HEPATIC) T-Vitamin D (25-Hydroxy) (32202-54270) Specimen Handling (62376)  BP today: 116/72 Prior BP: 120/60 (05/11/2010)  Labs Reviewed: K+: 4.9 (12/14/2009) Creat: : 0.9 (12/14/2009)   Chol: 138 (12/14/2009)   HDL: 49.10 (12/14/2009)   LDL: 70 (12/14/2009)   TG: 94.0 (12/14/2009)  Complete Medication List: 1)  Metoprolol Succinate 200 Mg Tb24 (Metoprolol succinate) .... Take one tablet daily 2)  Prazosin Hcl 1 Mg Caps (Prazosin hcl) .... Take 2 in am ,  1 in evening, 1 at bedtime 3)  Diltiazem Hcl Er Beads 180 Mg Cp24 (Diltiazem hcl er beads) .... Take one capsule daily 4)  Simvastatin 40 Mg Tabs (Simvastatin) .... One daily 5)  Valium 10 Mg Tabs (Diazepam) .... 1/2 by mouth two times a day as needed 6)  Prempro 0.3-1.5 Mg Tabs (Conj estrog-medroxyprogest ace) .Marland Kitchen.. 1 by mouth once daily 7)  Adult Aspirin Ec Low Strength 81 Mg Tbec (Aspirin) .... One by mouth daily 8)  Oscal 500/200 D-3 500-200 Mg-unit Tabs (Calcium-vitamin d) .... Daily 9)  Co-q 10 Omega-3 Fish Oil Caps (Coenzyme q10-fish oil-vit e) .... Daily 10)  Multivitamins Caps (Multiple vitamin) .... Daily 11)  Metoprolol Tartrate 25 Mg Tabs (Metoprolol tartrate) .... One by  mouth as needed/pill in pocket 12)  Iron 90 (18 Fe) Mg Tabs (Ferrous sulfate) .... 2 by mouth daily 13)  Osteo Bi-flex Regular Strength 250-200 Mg Tabs (Glucosamine-chondroitin) .Marland Kitchen.. 1 by mouth daily 14)  Losartan Potassium 100 Mg Tabs (Losartan potassium) .... One daily 15)  Probiotic Caps (Probiotic product) .... One capsule by mouth once daily 16)  Diazepam 10 Mg Tabs (Diazepam) .... 1/2 tablet by mouth twice daily if needed 17)  Nasonex 50 Mcg/act Susp (Mometasone furoate) .... 2 sprays each nostril once daily 18)  Zostavax [address] Unt/0.5ml Solr (Zoster vaccine live) .Marland Kitchen.. 1 ml im x1  Patient Instructions: 1)  Please schedule a follow-up appointment in 6 months .  Prescriptions: VALIUM 10 MG  TABS (DIAZEPAM) 1/2 by mouth two times a day as needed  #100 x 0   Entered and Authorized by:   Loreen Freud DO   Signed by:   Loreen Freud DO on 06/15/2010   Method used:   Print then Give to Patient   RxID:   [id number] ZOSTAVAX 95621 UNT/0.65ML SOLR (ZOSTER VACCINE LIVE) 1 ml im x1  #1 x 0   Entered and Authorized by:   Loreen Freud DO   Signed by:   Loreen Freud DO on 06/15/2010   Method used:   Print then Give to Patient   RxID:   [id number]    Orders Added: 1)  Venipuncture [41324] 2)  TLB-Lipid Panel [80061-LIPID] 3)  TLB-BMP (Basic Metabolic Panel-BMET) [80048-METABOL] 4)  TLB-CBC Platelet - w/Differential [85025-CBCD] 5)  TLB-Hepatic/Liver Function Pnl [80076-HEPATIC] 6)  T-Vitamin D (25-Hydroxy) [40102-72536] 7)  Specimen Handling [99000] 8)  Radiology Referral [Radiology] 9)  Medicare -1st Annual Wellness Visit [G0438] 10)  EKG w/ Interpretation [93000] 11)  Est. Patient Level III [64403]    Colonoscopy Next Due:  Refused Mammogram Result Date:  05/30/2010 Mammogram Result:  normal Mammogram Next Due:  1 yr  Appended Document: cpx & lab/cbs     Vital Signs:  Patient profile:   77 year old female Height:      58 inches Weight:      107.4 pounds BMI:      22.53  Vitals Entered By: Almeta Monas CMA Duncan Dull) (June 21, 2010 1:16 PM)  Allergies: No Known Drug Allergies   Complete Medication List: 1)  Metoprolol Succinate 200 Mg Tb24 (Metoprolol succinate) .... Take one tablet daily 2)  Prazosin Hcl 1 Mg Caps (Prazosin hcl) .... Take 2 in am , 1 in evening, 1 at bedtime 3)  Diltiazem Hcl Er Beads 180 Mg Cp24 (Diltiazem hcl er beads) .... Take one capsule daily 4)  Simvastatin 40 Mg Tabs (Simvastatin) .... One daily 5)  Valium 10 Mg Tabs (Diazepam) .... 1/2 by mouth two times  a day as needed 6)  Prempro 0.3-1.5 Mg Tabs (Conj estrog-medroxyprogest ace) .Marland Kitchen.. 1 by mouth once daily 7)  Adult Aspirin Ec Low Strength 81 Mg Tbec (Aspirin) .... One by mouth daily 8)  Oscal 500/200 D-3 500-200 Mg-unit Tabs (Calcium-vitamin d) .... Daily 9)  Co-q 10 Omega-3 Fish Oil Caps (Coenzyme q10-fish oil-vit e) .... Daily 10)  Multivitamins Caps (Multiple vitamin) .... Daily 11)  Metoprolol Tartrate 25 Mg Tabs (Metoprolol tartrate) .... One by mouth as needed/pill in pocket 12)  Iron 90 (18 Fe) Mg Tabs (Ferrous sulfate) .... 2 by mouth daily 13)  Osteo Bi-flex Regular Strength 250-200 Mg Tabs (Glucosamine-chondroitin) .Marland Kitchen.. 1 by mouth daily 14)  Losartan Potassium 100 Mg Tabs (Losartan potassium) .... One daily 15)  Probiotic Caps (Probiotic product) .... One capsule by mouth once daily 16)  Diazepam 10 Mg Tabs (Diazepam) .... 1/2 tablet by mouth twice daily if needed 17)  Nasonex 50 Mcg/act Susp (Mometasone furoate) .... 2 sprays each nostril once daily 18)  Zostavax 82956 Unt/0.7ml Solr (Zoster vaccine live) .Marland Kitchen.. 1 ml im x1  Appended Document: cpx & lab/cbs     Allergies: No Known Drug Allergies  Physical Exam  Psych:  Oriented X3, good eye contact, not anxious appearing, not depressed appearing, not agitated, and not suicidal.     Complete Medication List: 1)  Metoprolol Succinate 200 Mg Tb24 (Metoprolol succinate) .... Take one tablet  daily 2)  Prazosin Hcl 1 Mg Caps (Prazosin hcl) .... Take 2 in am , 1 in evening, 1 at bedtime 3)  Diltiazem Hcl Er Beads 180 Mg Cp24 (Diltiazem hcl er beads) .... Take one capsule daily 4)  Simvastatin 40 Mg Tabs (Simvastatin) .... One daily 5)  Valium 10 Mg Tabs (Diazepam) .... 1/2 by mouth two times a day as needed 6)  Prempro 0.3-1.5 Mg Tabs (Conj estrog-medroxyprogest ace) .Marland Kitchen.. 1 by mouth once daily 7)  Adult Aspirin Ec Low Strength 81 Mg Tbec (Aspirin) .... One by mouth daily 8)  Oscal 500/200 D-3 500-200 Mg-unit Tabs (Calcium-vitamin d) .... Daily 9)  Co-q 10 Omega-3 Fish Oil Caps (Coenzyme q10-fish oil-vit e) .... Daily 10)  Multivitamins Caps (Multiple vitamin) .... Daily 11)  Metoprolol Tartrate 25 Mg Tabs (Metoprolol tartrate) .... One by mouth as needed/pill in pocket 12)  Iron 90 (18 Fe) Mg Tabs (Ferrous sulfate) .... 2 by mouth daily 13)  Osteo Bi-flex Regular Strength 250-200 Mg Tabs (Glucosamine-chondroitin) .Marland Kitchen.. 1 by mouth daily 14)  Losartan Potassium 100 Mg Tabs (Losartan potassium) .... One daily 15)  Probiotic Caps (Probiotic product) .... One capsule by mouth once daily 16)  Diazepam 10 Mg Tabs (Diazepam) .... 1/2 tablet by mouth twice daily if needed 17)  Nasonex 50 Mcg/act Susp (Mometasone furoate) .... 2 sprays each nostril once daily 18)  Zostavax 21308 Unt/0.44ml Solr (Zoster vaccine live) .Marland Kitchen.. 1 ml im x1

## 2010-08-09 NOTE — Progress Notes (Signed)
Summary: mail rx to patient  sent electronically  Phone Note Refill Request Message from:  Patient on July 30, 2010 11:56 AM  Refills Requested: Medication #1:  LOSARTAN POTASSIUM 100 MG TABS one daily  Method Requested: Mail to Patient Initial call taken by: Lorne Skeens,  July 30, 2010 11:56 AM  Follow-up for Phone Call        will have rx signed and mailed however Dr Antoine Poche not back in until Thursday. Follow-up by: Charolotte Capuchin, RN,  July 30, 2010 12:18 PM    Prescriptions: LOSARTAN POTASSIUM 100 MG TABS (LOSARTAN POTASSIUM) one daily  #90 x 3   Entered by:   Charolotte Capuchin, RN   Authorized by:   Rollene Rotunda, MD, [facility]   Signed by:   Charolotte Capuchin, RN on 07/30/2010   Method used:   Print then Give to Patient   RxID:   [id number]

## 2010-08-09 NOTE — Letter (Signed)
Summary: Letter from Patient Regarding Meds  Letter from Patient Regarding Meds   Imported By: Lanelle Bal 07/24/2010 11:02:23  _____________________________________________________________________  External Attachment:    Type:   Image     Comment:   External Document

## 2010-08-09 NOTE — Letter (Signed)
Summary: [facility]: Immunoassay Fecal Occult Blood (iFOB) Order Form  [facility]  [address] [place], [place] [address]   Phone: [phone]  Fax: [phone]      [facility]: Immunoassay Fecal Occult Blood (iFOB) Order Form   July 13, 2010 MRN: [id number]   Gabrielle Warren [birth year]   Physicican Name:Dr.Lowne  Diagnosis Code: V56.71     Almeta Monas CMA (AAMA)

## 2010-08-09 NOTE — Progress Notes (Signed)
Summary: mail refill to patient  Phone Note Refill Request Call back at Home Phone [phone] Message from:  Patient on July 30, 2010 2:23 PM  Refills Requested: Medication #1:  LOSARTAN POTASSIUM 100 MG TABS one daily   Supply Requested: 3 months [facility] with 3 refill.    Method Requested: Mail to Patient Initial call taken by: Lorne Skeens,  July 30, 2010 2:25 PM  Follow-up for Phone Call        rx mailed to pt on Friday.  Pt notified. Marrion Coy, CNA  July 30, 2010 4:15 PM  Follow-up by: Marrion Coy, CNA,  July 30, 2010 4:15 PM

## 2010-08-30 ENCOUNTER — Encounter (INDEPENDENT_AMBULATORY_CARE_PROVIDER_SITE_OTHER): Payer: Self-pay | Admitting: *Deleted

## 2010-09-04 NOTE — Letter (Signed)
Summary: Appointment - Reschedule  [facility], Main Office  [address]   [place], [place] [address]   Phone: [phone]  Fax: [phone]     August 30, 2010 MRN: [id number]   BRYLEE BERK [address] [place], [place]  [address]   Dear Ms. Racey,   Due to a change in our office schedule, your appointment on  09-19-2010                     at   3:45 p.m. must be changed.  It is very important that we reach you to reschedule this appointment. We look forward to participating in your health care needs. Please contact us at the number listed above at your earliest convenience to reschedule this appointment.     Sincerely,       Lorne Skeens  [facility] Scheduling Team

## 2010-09-05 ENCOUNTER — Encounter: Payer: Self-pay | Admitting: Family Medicine

## 2010-09-14 ENCOUNTER — Telehealth: Payer: Self-pay | Admitting: Family Medicine

## 2010-09-18 NOTE — Medication Information (Signed)
Summary: Rx Clarification for Simvastatin+Diltiazem  Rx Clarification for Simvastatin+Diltiazem   Imported By: Maryln Gottron 09/14/2010 13:34:54  _____________________________________________________________________  External Attachment:    Type:   Image     Comment:   External Document

## 2010-09-18 NOTE — Progress Notes (Signed)
Summary: Valium Refill ---Gabrielle Warren pt  Phone Note Refill Request Message from:  Patient on September 14, 2010 8:46 AM  Refills Requested: Medication #1:  VALIUM 10 MG  TABS 1/2 by mouth two times a day as needed   Last Refilled: 06/15/2010 [facility] [address]   Method Requested: Electronic Initial call taken by: Almeta Monas CMA Duncan Dull),  September 14, 2010 8:45 AM  Follow-up for Phone Call        last seen and filled 06/15/10... please advise Follow-up by: Almeta Monas CMA Duncan Dull),  September 14, 2010 8:46 AM  Additional Follow-up for Phone Call Additional follow up Details #1::        ok for #90, no refills Additional Follow-up by: Neena Rhymes MD,  September 14, 2010 9:44 AM    Prescriptions: VALIUM 10 MG  TABS (DIAZEPAM) 1/2 by mouth two times a day as needed  #100 x 0   Entered by:   Jeremy Johann CMA   Authorized by:   Neena Rhymes MD   Signed by:   Jeremy Johann CMA on 09/14/2010   Method used:   Printed then faxed to ...       [facility]  [address] [id number]* (retail)       [address]       [place], [place]  [address]       Ph: [phone] or [phone]       Fax: [phone]   RxID:   [id number]

## 2010-09-19 ENCOUNTER — Ambulatory Visit: Payer: Self-pay | Admitting: Cardiology

## 2010-09-26 ENCOUNTER — Other Ambulatory Visit (INDEPENDENT_AMBULATORY_CARE_PROVIDER_SITE_OTHER): Payer: Medicare Other

## 2010-09-26 DIAGNOSIS — M810 Age-related osteoporosis without current pathological fracture: Secondary | ICD-10-CM

## 2010-09-26 DIAGNOSIS — E785 Hyperlipidemia, unspecified: Secondary | ICD-10-CM

## 2010-09-26 LAB — LIPID PANEL
Cholesterol: 141 mg/dL (ref 0–200)
LDL Cholesterol: 74 mg/dL (ref 0–99)
Triglycerides: 82 mg/dL (ref 0.0–149.0)
VLDL: 16.4 mg/dL (ref 0.0–40.0)

## 2010-09-26 LAB — HEPATIC FUNCTION PANEL
Albumin: 3.9 g/dL (ref 3.5–5.2)
Bilirubin, Direct: 0.1 mg/dL (ref 0.0–0.3)
Total Protein: 6.9 g/dL (ref 6.0–8.3)

## 2010-09-27 LAB — VITAMIN D 25 HYDROXY (VIT D DEFICIENCY, FRACTURES): Vit D, 25-Hydroxy: 41 ng/mL (ref 30–89)

## 2010-09-27 NOTE — Progress Notes (Signed)
Letter mailed

## 2010-10-08 ENCOUNTER — Encounter: Payer: Self-pay | Admitting: Cardiology

## 2010-10-09 ENCOUNTER — Ambulatory Visit (INDEPENDENT_AMBULATORY_CARE_PROVIDER_SITE_OTHER): Payer: Medicare Other | Admitting: Cardiology

## 2010-10-09 ENCOUNTER — Encounter: Payer: Self-pay | Admitting: Cardiology

## 2010-10-09 VITALS — BP 168/72 | HR 55 | Ht <= 58 in | Wt 109.0 lb

## 2010-10-09 DIAGNOSIS — I10 Essential (primary) hypertension: Secondary | ICD-10-CM

## 2010-10-09 DIAGNOSIS — R002 Palpitations: Secondary | ICD-10-CM

## 2010-10-09 DIAGNOSIS — I498 Other specified cardiac arrhythmias: Secondary | ICD-10-CM

## 2010-10-09 DIAGNOSIS — R001 Bradycardia, unspecified: Secondary | ICD-10-CM

## 2010-10-09 NOTE — Assessment & Plan Note (Signed)
She did not have any further palpitations. No change in therapy or further cardiovascular testing is suggested.

## 2010-10-09 NOTE — Assessment & Plan Note (Signed)
Her blood pressure is controlled and she will continue meds as listed. 

## 2010-10-09 NOTE — Patient Instructions (Signed)
Follow up in 6 months with Dr. Hochrein. 

## 2010-10-09 NOTE — Progress Notes (Signed)
HPI The patient presents for routine followup. Since I saw her she had been doing well. She has had no new cardiovascular symptoms.The patient denies any new symptoms such as chest discomfort, neck or arm discomfort. There has been no new shortness of breath, PND or orthopnea. There have been no reported palpitations, presyncope or syncope.  Her blood pressure has been well controlled. She has been tolerating the medications as listed. Continued her activity to walking.  No Known Allergies  Current Outpatient Prescriptions  Medication Sig Dispense Refill  . aspirin 81 MG tablet Take 81 mg by mouth daily.        . calcium-vitamin D (OSCAL WITH D) 500-200 MG-UNIT per tablet Take 1 tablet by mouth daily.        . diazepam (VALIUM) 10 MG tablet Take 10 mg by mouth as needed.        . diltiazem (CARDIZEM CD) 180 MG 24 hr capsule Take 180 mg by mouth daily.        Marland Kitchen estrogen, conjugated,-medroxyprogesterone (PREMPRO) 0.3-1.5 MG per tablet Take 1 tablet by mouth daily.        . Ferrous Sulfate (IRON) 90 (18 FE) MG TABS Take 2 tablets by mouth daily.        . fish oil-omega-3 fatty acids 1000 MG capsule Take 2 g by mouth daily.        Marland Kitchen glucosamine-chondroitin 500-400 MG tablet Take 1 tablet by mouth daily.        Marland Kitchen losartan (COZAAR) 100 MG tablet Take 100 mg by mouth daily.        . metoprolol (TOPROL-XL) 200 MG 24 hr tablet Take 200 mg by mouth daily.        . metoprolol succinate (TOPROL-XL) 25 MG 24 hr tablet Take 25 mg by mouth as needed. Take one by moth as needed/ pill in the pocket       . mometasone (NASONEX) 50 MCG/ACT nasal spray 2 sprays by Nasal route daily.        . Multiple Vitamin (MULTIVITAMIN) tablet Take 1 tablet by mouth daily.        . prazosin (MINIPRESS) 1 MG capsule Take 1 mg by mouth as directed.        . simvastatin (ZOCOR) 40 MG tablet Take 40 mg by mouth at bedtime.        Marland Kitchen DISCONTD: diltiazem (TIAZAC) 180 MG 24 hr capsule         Past Medical History  Diagnosis Date    . HYPERTENSION 07/25/2006  . ANXIETY 09/23/2007  . HYPERLIPIDEMIA 07/03/2009  . Atrial tachycardia   . MVP (mitral valve prolapse)   . Arthritis   . Osteoarthritis     Past Surgical History  Procedure Date  . Catart extraction 04/2000, 03/2002  . Carpal tunnel release 09/2004    ROS As stated in the HPI and negative for all other systems.   PHYSICAL EXAM BP 168/72  Pulse 55  Ht 4\' 10"  (1.473 m)  Wt 109 lb (49.442 kg)  BMI 22.78 kg/m2 GENERAL:  Well appearing HEENT:  Pupils equal round and reactive, fundi not visualized, oral mucosa unremarkable NECK:  No jugular venous distention, waveform within normal limits, carotid upstroke brisk and symmetric, no bruits, no thyromegaly LYMPHATICS:  No cervical, inguinal adenopathy LUNGS:  Clear to auscultation bilaterally BACK:  No CVA tenderness CHEST:  Unremarkable HEART:  PMI not displaced or sustained,S1 and S2 within normal limits, no S3, no S4, no clicks, no rubs,  no murmurs ABD:  Flat, positive bowel sounds normal in frequency in pitch, no bruits, no rebound, no guarding, no midline pulsatile mass, no hepatomegaly, no splenomegaly EXT:  2 plus pulses throughout, no edema, no cyanosis no clubbing SKIN:  No rashes no nodules NEURO:  Cranial nerves II through XII grossly intact, motor grossly intact throughout PSYCH:  Cognitively intact, oriented to person place and time  EKG: Sinus bradycardia, rate 53 within normal limits, intervals within normal limits, no acute ST-T wave changes  ASSESSMENT AND PLAN

## 2010-10-11 LAB — POCT I-STAT, CHEM 8
Calcium, Ion: 1.23 mmol/L (ref 1.12–1.32)
Creatinine, Ser: 0.9 mg/dL (ref 0.4–1.2)
Glucose, Bld: 97 mg/dL (ref 70–99)
Hemoglobin: 11.9 g/dL — ABNORMAL LOW (ref 12.0–15.0)
TCO2: 28 mmol/L (ref 0–100)

## 2010-10-11 LAB — POCT CARDIAC MARKERS: CKMB, poc: <1 ng/mL — ABNORMAL LOW (ref 1.0–8.0)

## 2010-11-20 NOTE — Assessment & Plan Note (Signed)
[facility]                            CARDIOLOGY OFFICE NOTE   REES, MATURA                MRN:          [id number]  DATE:07/27/2007                            DOB:          September 10, 1925    PRIMARY CARE PHYSICIAN:  Dr. Lelon Perla.   REASON FOR PROCEDURE:  Evaluate patient with difficult to control  hypertension.   HISTORY OF PRESENT ILLNESS:  The patient is a pleasant 77 year old.  She  has had no problems since I last saw her.  She keeps a blood pressure  diary once a day and her blood pressure averages about 130 systolic over  70's.  She has had no presyncope or syncope.  She has had no  palpitations.  She has had no chest pain or shortness of breath.  She  was on an antidepressant for anxiety and a slight mood disorder.  She  said that these worked well but she stopped them because of problems  libido.  She has yet to follow up with Dr. Laury Axon to discuss this.   PAST MEDICAL HISTORY:  Hypertension, anxiety, atrial tachycardia, mitral  valve prolapse.   ALLERGIES:  None.   MEDICATIONS:  1. Toprol 200 mg q.a.m.  2. Cardizem 180 mg q.p.m.  3. Avapro 300 mg q.a.m.  4. Simvastatin 40 mg q.p.m.  5. Fosamax.  6. Calcium.  7. Multivitamin.  8. Fish oil.  9. Flax seed oil.  10.Aspirin 325 mg daily.  11.Prazosin 2  mg q.a.m., 1 mg at 3:30 and 1 mg at 11:00 P.M.   REVIEW OF SYSTEMS:  As stated in the HPI and otherwise negative for  other systems.   PHYSICAL EXAMINATION:  The patient is in no distress.  Blood pressure:  173/70.  Heart rate:  55, regular.  Weight:  106 pounds.  Body mass  index:  19.  NECK:  No jugular venous distention at 45 degrees.  Carotid upstroke  brisk and symmetric.  No bruits, no thyromegaly.  LYMPHATICS:  No cervical, axillary or inguinal adenopathy.  LUNGS:  Clear to auscultation bilaterally.  BACK:  No costovertebral angle tenderness.  CHEST:  Unremarkable.  HEART:  PMI not displaced or  sustained.  S1 and S2 within normal limits.  No S3, no S4.  No clicks, rubs or murmurs.  ABDOMEN:  Flat, positive bowel sounds, normal in frequency and pitch.  No bruits, no rebound, no guarding.  No midline pulsatile mass.  No  organomegaly.  SKIN:  No rashes, no nodules.  EXTREMITIES:  2+ pulses.  No edema.   ASSESSMENT/PLAN:  1. Hypertension.  Blood pressure is controlled by her blood pressure      readings at home.  She does have some white coat hypertension, but      I do not think that I would respond to this.  She is on a regimen      that is complicated, but she can follow this without difficulty.      It seems to be working.  I will make no changes.  2. Followup.  I will see her back in one year or sooner if  needed.     Rollene Rotunda, MD, [facility]  Electronically Signed    JH/MedQ  DD: 07/27/2007  DT: 07/27/2007  Job #: [id number]   cc:   Lelon Perla, DO

## 2010-11-20 NOTE — Assessment & Plan Note (Signed)
[facility]                            CARDIOLOGY OFFICE NOTE   [name], [name]                MRN:          [id number]  DATE:11/17/2006                            DOB:          Mar 26, 1926    PRIMARY CARE PHYSICIAN:  Dr. Laury Axon   REASON FOR PRESENTATION:  Evaluate patient with difficult to control  hypertension.   HISTORY OF PRESENT ILLNESS:  This patient returns for followup.  Since  her last visit, she has done a little bit better.  She has been put on  Zoloft and she thinks that this may be helping.  She is more calm with  this, though a little more fatigued.  She has had blood pressures that  have actually been quite low frequently. She will have systolics in the  80s and 90s.  She feels lightheaded with this.  She has not had any  significant elevations, though it is elevated in the office today.  Overall, the trend is much improved.  She does have, again, some  lightheadedness.  She had an episode of syncope, but she had not eaten.  She did not feel palpitations.  She was not describing orthostatic  symptoms.  This was on April 13.  She was seated when it happened.  She  recovered very quickly and had no loss of bowel or bladder.  She has had  no recurrence of this.  She has not been having any chest discomfort.  She has no PND or orthopnea.   PAST MEDICAL HISTORY:  Hypertension, anxiety, atrial tachycardia, mitral  valve prolapse.   ALLERGIES:  None.   MEDICATIONS:  1. Toprol XL 200 mg daily.  2. Avapro 200 mg q.p.m.  3. Zocor 40 mg daily.  4. Fosamax.  5. Calcium.  6. Multivitamin.  7. Fish oil.  8. Flax seed oil.  9. Aspirin 325 mg 2 tablets daily.  10.Prazosin 2 mg q.a.m., 1 mg at noon and 1 mg at p.m.  11.Maxzide 37.5 mg 1/2 tablet daily.  12.Zoloft 50 mg daily.  13.Cardizem 180 mg daily.  14.Potassium 20 mEq daily.   REVIEW OF SYSTEMS:  As stated in the HPI and otherwise negative for  other systems.   PHYSICAL EXAMINATION:  The patient is in no distress.  Blood pressure 172/80, heart rate 49 and regular, weight 97 pounds, body  mass index 18.5.  HEENT:  Eyelids unremarkable, pupils are equal, round and react to  light, fundi not visualized.  Oral mucosa unremarkable.  NECK:  No jugular venous distention, wave form within normal limits,  carotid upstroke brisk and symmetric.  No bruits, no thyromegaly.  LYMPHATICS:  No cervical, axillary, inguinal adenopathy.  LUNGS:  Clear to auscultation bilaterally.  BACK:  No costovertebral angle tenderness.  CHEST:  Unremarkable.  HEART:  PMI not displaced or sustained.  S1 and S2 within normal limits,  no S3, no S4, no clicks, no rubs, no murmurs.  ABDOMEN:  Flat, positive bowel sounds, normal in frequency and pitch.  No bruits, no rebound, no guarding, no midline pulsatile mass, no  hepatomegaly, no splenomegaly.  SKIN:  No rashes, no nodules.  EXTREMITIES:  2+ pulses, no cyanosis, no clubbing, no edema.  NEURO:  Oriented to person, place and time.  Cranial nerves II through  XII grossly intact.  Motor grossly intact throughout.   EKG:  Sinus bradycardia, rate 49, axes within normal limits, intervals  within normal limits, no acute ST-T wave changes.   ASSESSMENT AND PLAN:  1. Hypertension.  The patient's blood pressure actually is now      registering on the low side.  This may have something to do with      the Zoloft and decrease in anxiety.  She is symptomatic with these      low blood pressures.  Therefore, I am going to have her stop the      Maxzide.  She will stop the potassium as well.  Because she had a      low sodium recently, I am going to have her come back in about 4      weeks for a repeat BMET.  She will continue the other medications      as listed.  2. Followup.  I will see her back in about 2 months or sooner if      needed.     Rollene Rotunda, MD, [facility]     JH/MedQ  DD: 11/17/2006  DT: 11/18/2006  Job #:  [id number]   cc:   Lelon Perla, DO

## 2010-11-20 NOTE — Assessment & Plan Note (Signed)
[facility]                            CARDIOLOGY OFFICE NOTE   Gabrielle Warren, Gabrielle Warren                MRN:          [id number]  DATE:01/14/2007                            DOB:          [birth year]    PRIMARY:  Dr. Laury Axon.   REASON FOR PRESENTATION:  Patient with difficult to control  hypertension.   HISTORY OF PRESENT ILLNESS:  The patient returns for followup.  She is  now 77 years old.  We have had a difficult time adjusting her meds.  However, today she brings a blood pressure diary that shows almost  perfect blood pressure control.  She is not having any shortness of  breath.  She is not having any palpitations, presyncope.  She is having  no lower extremity swelling.  She is not having any chest pain.  She is  much less anxious than she has been previously.   PAST MEDICAL HISTORY:  Hypertension, anxiety, atrial tachycardia, mitral  valve prolapse.   ALLERGIES:  None.   MEDICATIONS:  1. Toprol 200 mg q. 9 a.m.  2. Cardizem 180 mg q. 8 p.m.  3. Avapro 300 mg q. 9 a.m.  4. Zocor 40 mg q. 8 p.m.  5. Fosamax.  6. Calcium.  7. Multivitamin.  8. Fish oil.  9. Flax seed oil.  10.Aspirin 325 mg q. 9 a.m.  11.Prazosin 2 mg q. 9 a.m., 1 mg q. 3 p.m., 1 mg q. 11 p.m.  12.Zoloft 50 mg q. 9 a.m.   REVIEW OF SYSTEMS:  As stated in the HPI.  Otherwise negative for other  systems.   PHYSICAL EXAMINATION:  The patient is in no distress.  Blood pressure 160/72, heart rate 68 and regular.  NECK:  No jugular venous distention 45 degrees. Carotid upstroke brisk  and symmetric, no bruits, no thyromegaly.  LYMPHATICS:  No nodules.  LUNGS:  Clear to auscultation bilaterally.  HEART:  PMI not displaced or sustained, S1, S2 within normal limits.  No  S3, no S4, no clicks, no rubs, no murmurs.  EXTREMITIES:  Two plus pulses, no edema.   ASSESSMENT AND PLAN:  1. Hypertension.  Blood pressure is currently well controlled on the      regimen as listed.   We will continue with this.  She will continue      to monitor it.  2. Palpitations.  She is not having any problems with this.  She will      continue the medical regimen as listed.  3. Followup.  I will see her back in about 6 months or sooner if      needed.     Rollene Rotunda, MD, [facility]  Electronically Signed    JH/MedQ  DD: 01/14/2007  DT: 01/15/2007  Job #: [id number]   cc:   Lelon Perla, DO

## 2010-11-23 NOTE — Assessment & Plan Note (Signed)
[facility]                            CARDIOLOGY OFFICE NOTE   Gabrielle Warren, Gabrielle Warren                MRN:          [id number]  DATE:10/03/2006                            DOB:          1926/05/25    Primary care physician is Lelon Perla, DO   REASON FOR PRESENTATION:  Evaluate patient for hypertension.   HISTORY OF PRESENT ILLNESS:  The patient is a 77 year old who presents  again for evaluation of blood pressure.  She has had multiple visits and  many phone calls for this.  She says she is still feeling her blood  pressure going up.  She gets anxious with this.  She did not increase  the prazosin as I suggested.  There seemed to be some miscommunication  about this.  She is wondering now whether she should go back on  Cardizem, which she discontinued.  She feels predominantly fatigued.  However, she does feel anxious and gets a headache when her blood  pressure goes up, which it does mostly in the afternoon.  She has had no  chest discomfort.  She has had no new shortness of breath.  Denies any  PND or orthopnea.   PAST MEDICAL HISTORY:  1. Mitral valve prolapse.  2. Hypertension.  3. Atrial tachycardia.   ALLERGIES:  None.   MEDICATIONS:  1. Toprol XL 200 mg q.p.m.  2. Cardizem CD 240 mg q.a.m.  3. Avapro 300 mg q.p.m.  4. Zocor 40 mg daily.  5. Fosamax.  6. Calcium.  7. Multivitamin.  8. Fish oil.  9. Flaxseed oil.  10.Aspirin 325 mg two tablets daily.  11.Prazosin 2 mg q.a.m., 1 mg at noon and 1 mg q.p.m.   REVIEW OF SYSTEMS:  As stated in the HPI and otherwise negative for  other systems.   PHYSICAL EXAMINATION:  GENERAL:  The patient is in no distress.  VITAL SIGNS:  Blood pressure 164/69, heart rate 52 and regular, weight  99 pounds.  Body mass index 19.  HEENT:  Eyelids unremarkable.  Pupils equal, round, and reactive to  light.  Fundi not visualized.  NECK:  No jugular venous distention at 45 degrees.  Carotid  upstroke  brisk and symmetric.  No bruits or thyromegaly.  LYMPHATIC:  No cervical, axillary or inguinal adenopathy.  LUNGS:  Clear to auscultation bilaterally.  BACK:  No costovertebral angle tenderness.  CHEST:  Unremarkable.  HEART:  PMI not displaced or sustained, S1 and S2 within normal limits.  No S3, no S4.  No clicks, no rubs, no murmurs.  ABDOMEN:  Flat, positive bowel sounds, normal in frequency and pitch, no  bruits, no guarding, no midline pulsatile mass, no organomegaly.  SKIN:  No rashes, no nodules.  EXTREMITIES:  2+ pulses, no edema.   ASSESSMENT AND PLAN:  1. Hypertension.  I do think that anxiety plays a significant role.      She takes 5 mg of Valium every morning.  I have asked her to take      another 2.5 mg in the afternoon when her blood pressure is starting      to go  up.  I also want her to discuss this with Dr. Laury Axon, as she      may need another anxiety regimen.  I am going to restart Maxzide at      the 37.5 mg dose but one-half tablet instead of a full dose.  She      needs a BMET in 2 weeks.  She will also reduce her Cardizem to 180      mg daily.  I am doing this because she is fatigued and she has had      bradycardia.  I told her she needs to give this a longer try than      she has with previous regimens.  2. Follow-up.  I would like to get her to get an appointment with Dr.      Laury Axon to discuss anxiolytic treatment.  I will see her back in      about 6 weeks or sooner if needed.     Rollene Rotunda, MD, [facility]  Electronically Signed    JH/MedQ  DD: 10/03/2006  DT: 10/03/2006  Job #: [id number]   cc:   Lelon Perla, DO

## 2010-11-23 NOTE — Assessment & Plan Note (Signed)
[facility]                                 ON-CALL NOTE   JAMISON, YUHASZ                MRN:          [id number]  DATE:09/09/2006                            DOB:          1925/09/29    TELEPHONE CALL:  The patient called today.  She is continuing to have  hypertension with blood pressure in the 140's to 150's/60's,  Most of  the time it is elevated more in the afternoon and the early evening.  She did not tolerate Maxzide, as it made her somewhat forgetful.   Today I am going to increase her prazosin by adding another 1 mg to her  a.m. dose.  She will take 2 mg in the a.m. and 1 mg in the afternoon and  1 mg in the evening.  She will keep a check on her blood pressure.   Of note, she did have hyponatremia and had follow-up blood work at Dr.  Grayling Congress. Low[place]'s office.  I will review this.     Rollene Rotunda, MD, [facility]  Electronically Signed    JH/MedQ  DD: 09/09/2006  DT: 09/09/2006  Job #: [id number]   cc:   Lelon Perla, DO

## 2010-11-23 NOTE — Assessment & Plan Note (Signed)
[facility]                            CARDIOLOGY OFFICE NOTE   DANA, DEBO                MRN:          [id number]  DATE:08/11/2006                            DOB:          1926/02/17    REASON FOR PRESENTATION:  Evaluate patient with hypertension.   HISTORY OF PRESENT ILLNESS:  Patient is 77 years old.  She has a history  of difficult to control hypertension.  She has had a workup of this to  include renal ultrasound demonstrating no significant evidence of renal  artery stenosis.  She has been keeping her blood pressure sporadically  at home.  She says when she feels it going up she will take it and she  does sometimes notice the systolic to be in the 180's.  At other times  diastolic is 100.  She also notices her heart rate to be in the 40's and  50's.  She noticed this on an EKG recently.  She has not had any  presyncope or syncope.  She, however, thinks it makes her feel sluggish.  She denies any chest discomfort, neck discomfort, arm discomfort,  activity-induced nausea, vomiting, excessive diaphoresis.  She has not  had any shortness of breath, PND or orthopnea.   PAST MEDICAL HISTORY:  1. Mitral valve prolapse.  2. Hypertension.  3. Atrial tachycardia.   ALLERGIES:  NONE.   MEDICATIONS:  1. Toprol 200 mg daily.  2. Cardizem 240 mg daily.  3. Avapro 300 mg daily.  4. Zocor 40 mg daily.  5. Prazosin 1 mg t.i.d.  6. Fosamax.  7. Calcium 600 mg b.i.d.  8. Multivitamin.  9. Fish oil.  10.Flaxseed oil.   REVIEW OF SYSTEMS:  As stated in the HPI and otherwise negative for  other systems.   PHYSICAL EXAMINATION:  The patient is in no distress.  Blood pressure  187/79, heart rate 54 and regular, weight 101 pounds.  HEENT:  Eyes unremarkable, pupils equal, round, react to light, fundi  not visualized, oral mucosa unremarkable.  NECK:  No jugular venous distention at 45 degrees, carotid upstroke  brisk and symmetric, no  bruits, no thyromegaly.  LYMPHATICS:  No cervical, axillary or inguinal adenopathy.  LUNGS:  Clear to auscultation bilaterally.  BACK:  No costovertebral angle tenderness.  CHEST:  Unremarkable.  HEART:  The PMI not displaced or sustained, S1 and S2 within normal  limits, no S3, no S4, no clicks, no rubs, no murmurs.  ABDOMEN:  Flat, positive bowel sounds normal in frequency and pitch, no  bruits, no rebound, no guarding, no midline pulses, no masses, no  hepatomegaly, no splenomegaly.  SKIN:  No rashes, no nodules.  EXTREMITIES:  Pulses 2+, no cyanosis, no clubbing, no edema.  NEURO:  Oriented to person, place and time, cranial nerves II-XII  grossly intact, motor grossly intact.   ASSESSMENT AND PLAN:  1. Hypertension.  Her blood pressure is somewhat labile.  At this      point I think it is reasonable to switch her to drugs that cause      her less bradycardia, she may be getting fatigued with this.  Therefore, I am going to reduce her Cardizem to 120 mg a day.  I am      going to add Maxzide once daily.  She is going to get a BMET in 2      weeks.  Eventually I may switch to amlodipine completely off the      Cardizem.  She is also going to try to stagger the doses of her      medications to see if we can even out her blood pressures.  She is      also going to keep a more accurate blood pressure diary.  2. Followup.  I will see her back in this clinic in about 6 weeks and      a little bit more frequently until we get her blood pressure      controlled.     Rollene Rotunda, MD, [facility]  Electronically Signed    JH/MedQ  DD: 08/11/2006  DT: 08/11/2006  Job #: [id number]

## 2010-11-23 NOTE — Assessment & Plan Note (Signed)
[facility]                            CARDIOLOGY OFFICE NOTE   Gabrielle Warren, Gabrielle Warren                MRN:          [id number]  DATE:09/22/2006                            DOB:          08/24/1925    REASON FOR PRESENTATION:  Evaluate patient with hypertension.   HISTORY OF PRESENT ILLNESS:  The patient is 77 years old.  She presents  for followup of her blood pressure.  We have been actively titrating her  medications.  I started her on Maxzide most recently.  However, she did  not tolerate this.  She thought it made her loopy.  She stopped this.  She actually titrated back Cardizem which I had reduced because of  bradycardia.  She states she has not had any problems since then.  She  has not had any symptomatic bradycardia.  She comes with an exquisitely  detailed blood pressure list.  It is clear that in the mornings her  systolic pressure is in the 160s.  It is in the one teens in the  afternoon.  It is rarely above 140 after this.  She denies chest  pressure, neck discomfort, arm discomfort, activity-induced nausea,  vomiting, excessive diaphoresis.  She has had no palpitations,  presyncope, or syncope.  She has had no PND or orthopnea.   PAST MEDICAL HISTORY:  1. Mitral valve prolapse.  2. Hypertension.  3. Atrial tachycardia.   ALLERGIES:  NONE.   MEDICATIONS:  1. Toprol XL 200 mg at 6 p.m.  2. Cardizem 240 mg at 8 a.m.  3. Avapro 300 mg at 6 p.m.  4. Simvastatin 40 mg q.h.s.  5. Prazosin 2 mg in the a.m., 1 mg in the afternoon, and 1 mg p.m.  6. Fosamax.  7. Calcium.  8. Multivitamin.  9. Fish oil.  10.Flax seed.  11.Aspirin 325 mg two tablets daily.   REVIEW OF SYSTEMS:  As stated in the HPI, and otherwise negative for the  systems.   PHYSICAL EXAMINATION:  GENERAL:  The patient is in no distress.  VITAL SIGNS:  Blood pressure 152/70, heart rate 55 and regular, weight  97 pounds, body mass index 18.5.  HEENT:  Eyelids  unremarkable.  Pupils are equal, round, and reactive to  light.  Fundi not visualized.  Oral mucosa unremarkable.  NECK:  No jugular venous distention.  Wave form within normal limits.  Carotid upstroke brisk and symmetric.  No bruits.  No thyromegaly.  LYMPHATICS:  No adenopathy.  LUNGS:  Clear to auscultation bilaterally.  BACK:  No costovertebral angle tenderness.  CHEST:  Unremarkable.  HEART:  PMI not displaced or sustained.  S1 and S2 within normal limits.  No S3, no S4, no clicks, no rubs, no murmurs.  ABDOMEN:  Flat, positive bowel sounds, normal in frequency and pitch.  No bruits.  No rebound.  No guarding.  No midline pulsatile mass.  No  organomegaly.  SKIN:  No rashes.  No nodules.  EXTREMITIES:  Pulses 2+, no edema.   ASSESSMENT/PLAN:  1. Hypertension.  Today we went over this quite at length in the      office.  We reviewed everything and came up with the option of      increasing her Prazosin dose to 2 mg in the evening to control the      a.m. blood pressure.  I also told her that these are getting closer      to optimal control and that she should relax and hopefully this      well help.  2. Followup.  She is going to come back and see me in a couple of      months or sooner if needed.     Rollene Rotunda, MD, [facility]  Electronically Signed    JH/MedQ  DD: 09/22/2006  DT: 09/22/2006  Job #: [id number]

## 2010-12-13 ENCOUNTER — Other Ambulatory Visit: Payer: Self-pay | Admitting: *Deleted

## 2010-12-13 MED ORDER — DIAZEPAM 10 MG PO TABS: 10.0000 mg | ORAL_TABLET | ORAL | Status: DC | PRN

## 2010-12-13 NOTE — Telephone Encounter (Signed)
Last filled 03-25-10 #90, Last OV 06-15-10

## 2010-12-13 NOTE — Telephone Encounter (Signed)
Ok to refill x 1  

## 2010-12-13 NOTE — Telephone Encounter (Signed)
Rx faxed

## 2010-12-14 ENCOUNTER — Telehealth: Payer: Self-pay

## 2010-12-14 MED ORDER — DIAZEPAM 10 MG PO TABS: ORAL_TABLET | ORAL | Status: DC

## 2010-12-14 NOTE — Telephone Encounter (Signed)
Call from patient and she stated she normally get #90 and only got 30 directions 1/2 tablet BID --would like the rest of the Rx sent to the pharmacy . Please advise     KP

## 2010-12-14 NOTE — Telephone Encounter (Signed)
Patient aware 90 day supply sent to pharmacy      KP

## 2010-12-14 NOTE — Telephone Encounter (Signed)
Ok to change to 3 month supply

## 2010-12-17 ENCOUNTER — Ambulatory Visit: Payer: Self-pay | Admitting: Family Medicine

## 2011-02-07 ENCOUNTER — Emergency Department (HOSPITAL_COMMUNITY)
Admission: EM | Admit: 2011-02-07 | Discharge: 2011-02-08 | Discharge status: Against Medical Advice | Payer: Medicare Other | Attending: Emergency Medicine | Admitting: Emergency Medicine

## 2011-02-07 DIAGNOSIS — R5381 Other malaise: Secondary | ICD-10-CM | POA: Insufficient documentation

## 2011-02-08 ENCOUNTER — Telehealth: Payer: Self-pay | Admitting: Cardiology

## 2011-02-08 NOTE — Telephone Encounter (Signed)
Pt with patient who has been having dizziness and bradycardia.  She went to be evaluated at [facility] but left before being seen because the wait so too long.  She reports her BP was 80/35 and her HR was 37.  She was also having SOB.  Before she left the ED her HR was up to 48 - 52.  Today she "skipped her minipress" and her BP is 133/58.  She feels like medications need to be adjusted.  An appointment was given to her for Tuesday at 11:30 with the PA in Dr Hochrein's absence.  She states she may try to get in with her primary care on Monday but will let us know.

## 2011-02-08 NOTE — Telephone Encounter (Signed)
Per pt call she was getting ready to go to bed last night and she felt weak and light headed and like she was going to pass out and she took her b/p it was 80/35 and pulse was 37 so her daughter took her to [facility] and she was ok b/p went up a little bit and she went home and she was wondering if her meds may need to be adjusted.

## 2011-02-11 ENCOUNTER — Ambulatory Visit (INDEPENDENT_AMBULATORY_CARE_PROVIDER_SITE_OTHER): Payer: Medicare Other | Admitting: Internal Medicine

## 2011-02-11 ENCOUNTER — Encounter: Payer: Self-pay | Admitting: Internal Medicine

## 2011-02-11 ENCOUNTER — Telehealth: Payer: Self-pay | Admitting: Internal Medicine

## 2011-02-11 DIAGNOSIS — I10 Essential (primary) hypertension: Secondary | ICD-10-CM

## 2011-02-11 DIAGNOSIS — I498 Other specified cardiac arrhythmias: Secondary | ICD-10-CM

## 2011-02-11 DIAGNOSIS — R001 Bradycardia, unspecified: Secondary | ICD-10-CM

## 2011-02-11 DIAGNOSIS — I951 Orthostatic hypotension: Secondary | ICD-10-CM

## 2011-02-11 LAB — CBC WITH DIFFERENTIAL/PLATELET
Basophils Absolute: 0 10*3/uL (ref 0.0–0.1)
Basophils Relative: 0.6 % (ref 0.0–3.0)
Eosinophils Absolute: 0.1 10*3/uL (ref 0.0–0.7)
Lymphocytes Relative: 23.6 % (ref 12.0–46.0)
MCHC: 33.7 g/dL (ref 30.0–36.0)
MCV: 96.9 fl (ref 78.0–100.0)
Monocytes Absolute: 0.8 10*3/uL (ref 0.1–1.0)
Neutrophils Relative %: 62.8 % (ref 43.0–77.0)
RBC: 3.66 Mil/uL — ABNORMAL LOW (ref 3.87–5.11)
RDW: 12.7 % (ref 11.5–14.6)

## 2011-02-11 LAB — BASIC METABOLIC PANEL
BUN: 14 mg/dL (ref 6–23)
Calcium: 8.9 mg/dL (ref 8.4–10.5)
GFR: 78.5 mL/min (ref >60.00)
Glucose, Bld: 113 mg/dL — ABNORMAL HIGH (ref 70–99)
Potassium: 4.2 mEq/L (ref 3.5–5.1)
Sodium: 132 mEq/L — ABNORMAL LOW (ref 135–145)

## 2011-02-11 MED ORDER — METOPROLOL SUCCINATE ER 25 MG PO TB24: 25.0000 mg | ORAL_TABLET | ORAL | Status: DC

## 2011-02-11 NOTE — Telephone Encounter (Signed)
In reference to Cardiology Referral for Bradycardia, patient was already scheduled to see [facility] tomorrow, 02-12-11.  I had them to add above DX to her appointment reason.  When I called to inform patient, she stated she was calling to cancel her appt with the PA, because she felt comfortable with Dr. Frederik Pear assessment today.  Patient states she wants to see the first available Physician, not a PA.  Per [facility], patient has the first available, and there are 2 PAs that can see her tomorrow.  I tried to convince patient she should keep 02-12-11 appointment, but still wanted cancelled.  Shouldn't she have kept tomorrow's appt?  If so, I am unable to convince her.  She is calling to schedule her own appt to get the 24 hour Holter.

## 2011-02-11 NOTE — Patient Instructions (Signed)
Until you're seen by the cardiologist; please hold the Metoprolol  200 mg beta blocker. Take the  Metoprolol 25 mg  by mouth as 2 pills twice a day.

## 2011-02-11 NOTE — Progress Notes (Signed)
Subjective:    Patient ID: Gabrielle Warren, female    DOB: 1925/10/17, 77 y.o.   MRN: [id number]  HPI HYPOTENSION: Onset:02/07/2011 Context:upon standing from chair; she had been reading Benign positional vertigo symptoms:no Cardiac prodrome:no palpitations, irregular rhythm, heart rate change but bradycardia of 37 with symptoms of heart "fluttering", dyspnea & weakness. BP was 80/35 Neurologic prodrome:no  headache, numbness and tingling, weakness, change in coordination (gait/falling) Syncope:no Seizure activity:no Duration:"couple hours" ; she was seen in ER & EKG was done .  Associated signs and symptoms: Visual change (blurred/double/loss):no Hearing loss/tinnitus:no Nausea/sweating:no Chest pain:no Treatment/response:no treatment in ER  She had a recurrence of similar symptoms last night over several hours WHILE seated. She was slow heart rate, as low as 42. She denied associated dyspnea or weakness with this episode. BP was 102/42    Disease Monitoring  Blood pressure range usually : 120-130/55-62  Chest pain: no   Dyspnea: no, only with event   Claudication: no               Edema: no  Medication compliance: she decreased Toprol dosage  Thinking it caused symptoms   Preventitive Healthcare:  Exercise: yes, walking 30-45 min 2-3x/ week   Diet Pattern: low sugar  Salt Restriction: no  She is on both a calcium channel blocker as well as beta blocker. She believes that both are prescribed by her cardiologist.      Review of Systems     Objective:   Physical Exam Gen.:Thin but healthy and well-nourished in appearance. Alert, appropriate and cooperative throughout exam. Head: Normocephalic without obvious abnormalities  Eyes: No corneal or conjunctival inflammation noted. Pupils equal round reactive to light and accommodation. FOV WNL. Extraocular motion intact.  Mouth: Oral mucosa and oropharynx reveal no lesions or exudates. Teeth in good repair. Tongue not  deviated Neck: No deformities, masses, or tenderness noted. Range of motion  normal. Thyroid  Asymmetric & firm. Lungs: Normal respiratory effort; chest expands symmetrically. Lungs are clear to auscultation without rales, wheezes, or increased work of breathing. Heart: Slow rate and regular rhythm. Normal S1 and S2. No gallop, click, or rub. Grade 1/ 6 R base systolic murmur.                                                                           Musculoskeletal/extremities:  No clubbing, cyanosis, edema noted. Tone & strength  normal.Joints : marked OA  handchanges Nail health  good. Vascular: Carotid, radial artery,  and  posterior tibial pulses are full and equal. Carotid  Bruits vs murmur radiation present. Neurologic: Alert and oriented x3. Deep tendon reflexes symmetrical and normal.          Skin: Intact without suspicious lesions or rashes. Lymph: No cervical, axillary lymphadenopathy present. Psych: Mood and affect are normal. Normally interactive  Assessment & Plan:  #1 episodic profound bradycardia with hypotension.  On CCB & Beta blocker.The first episode was postural the second while sitting.  Plan: Holter monitor to rule out intermittent heart block. See lab orders.

## 2011-02-12 ENCOUNTER — Ambulatory Visit: Payer: Medicare Other | Admitting: Physician Assistant

## 2011-02-12 NOTE — Telephone Encounter (Signed)
FYI

## 2011-02-14 LAB — HEMOGLOBIN A1C: Hgb A1c MFr Bld: 5.8 % (ref 4.6–6.5)

## 2011-02-14 NOTE — Telephone Encounter (Signed)
Part of Hopps assessment was to see cardiology and it sounds like he felt it was pretty important.  Yes---she should have kept the appointment.  --- was holter scheduled?

## 2011-02-14 NOTE — Telephone Encounter (Signed)
Yes Holter scheduled for tomorrow, 02-15-11, and patient is aware.

## 2011-02-14 NOTE — Telephone Encounter (Signed)
Ok thanks 

## 2011-02-15 ENCOUNTER — Encounter (INDEPENDENT_AMBULATORY_CARE_PROVIDER_SITE_OTHER): Payer: Medicare Other

## 2011-02-15 DIAGNOSIS — R002 Palpitations: Secondary | ICD-10-CM

## 2011-02-19 ENCOUNTER — Other Ambulatory Visit: Payer: Self-pay | Admitting: Family Medicine

## 2011-02-19 ENCOUNTER — Encounter: Payer: Self-pay | Admitting: Cardiology

## 2011-02-19 ENCOUNTER — Ambulatory Visit (INDEPENDENT_AMBULATORY_CARE_PROVIDER_SITE_OTHER): Payer: Medicare Other | Admitting: Cardiology

## 2011-02-19 VITALS — BP 156/73 | HR 66 | Ht 59.0 in | Wt 105.4 lb

## 2011-02-19 DIAGNOSIS — I10 Essential (primary) hypertension: Secondary | ICD-10-CM

## 2011-02-19 DIAGNOSIS — D649 Anemia, unspecified: Secondary | ICD-10-CM

## 2011-02-19 DIAGNOSIS — R002 Palpitations: Secondary | ICD-10-CM

## 2011-02-19 NOTE — Progress Notes (Signed)
HPI The patient presents for routine followup. Since I saw her she had palpitations. She wore an event monitor which I reviewed today.  This demonstrated normal sinus rhythm, occasional premature atrial contractions and atrial runs. She did have some brief sinus pauses. She had symptoms reported.  Since wearing the monitor, she reduced her Prazosin on her own.  Dr. Alwyn Ren reduce her metoprolol and half. Her blood pressures and heart rate have been better with no significant symptoms since then. She denies any syncope. She said her chest discomfort, neck or arm discomfort. I reviewed her blood pressure diary and her blood pressures have been controlled though is slightly elevated today.   No Known Allergies  Current Outpatient Prescriptions  Medication Sig Dispense Refill  . aspirin 81 MG tablet Take 81 mg by mouth daily.        . calcium-vitamin D (OSCAL WITH D) 500-200 MG-UNIT per tablet Take 1 tablet by mouth daily.        . diazepam (VALIUM) 10 MG tablet 1/2 tablet twice a day  90 tablet  0  . diltiazem (CARDIZEM CD) 180 MG 24 hr capsule Take 180 mg by mouth daily.        Marland Kitchen estrogen, conjugated,-medroxyprogesterone (PREMPRO) 0.3-1.5 MG per tablet Take 1 tablet by mouth daily.        . Ferrous Sulfate (IRON) 90 (18 FE) MG TABS Take 2 tablets by mouth daily.        . fish oil-omega-3 fatty acids 1000 MG capsule Take 2 g by mouth daily.        Marland Kitchen glucosamine-chondroitin 500-400 MG tablet Take 1 tablet by mouth daily.        Marland Kitchen losartan (COZAAR) 100 MG tablet Take 100 mg by mouth daily.        . metoprolol succinate (TOPROL-XL) 25 MG 24 hr tablet Take 1 tablet (25 mg total) by mouth as directed. Take 2 pills twice a day until seen by the cardiologist  60 tablet  0  . mometasone (NASONEX) 50 MCG/ACT nasal spray 2 sprays by Nasal route daily.        . Multiple Vitamin (MULTIVITAMIN) tablet Take 1 tablet by mouth daily.        . prazosin (MINIPRESS) 1 MG capsule Take 1 mg by mouth as directed.        .  simvastatin (ZOCOR) 40 MG tablet Take 40 mg by mouth at bedtime.          Past Medical History  Diagnosis Date  . HYPERTENSION 07/25/2006  . ANXIETY 09/23/2007  . HYPERLIPIDEMIA 07/03/2009  . Atrial tachycardia   . MVP (mitral valve prolapse)   . Arthritis   . Osteoarthritis     Past Surgical History  Procedure Date  . Catart extraction 04/2000, 03/2002  . Carpal tunnel release 09/2004    ROS  As stated in the HPI and negative for all other systems.   PHYSICAL EXAM BP 156/73  Pulse 66  Ht 4\' 11"  (1.499 m)  Wt 105 lb 6.4 oz (47.809 kg)  BMI 21.29 kg/m2 GENERAL:  Well appearing HEENT:  Pupils equal round and reactive, fundi not visualized, oral mucosa unremarkable NECK:  No jugular venous distention, waveform within normal limits, carotid upstroke brisk and symmetric, no bruits, no thyromegaly LYMPHATICS:  No cervical, inguinal adenopathy LUNGS:  Clear to auscultation bilaterally BACK:  No CVA tenderness CHEST:  Unremarkable HEART:  PMI not displaced or sustained,S1 and S2 within normal limits, no S3, no S4,  no clicks, no rubs, no murmurs ABD:  Flat, positive bowel sounds normal in frequency in pitch, no bruits, no rebound, no guarding, no midline pulsatile mass, no hepatomegaly, no splenomegaly EXT:  2 plus pulses throughout, no edema, no cyanosis no clubbing SKIN:  No rashes no nodules NEURO:  Cranial nerves II through XII grossly intact, motor grossly intact throughout PSYCH:  Cognitively intact, oriented to person place and time  EKG: Sinus bradycardia, rate 66 within normal limits, intervals within normal limits, no acute ST-T wave changes  ASSESSMENT AND PLAN

## 2011-02-19 NOTE — Assessment & Plan Note (Signed)
She did have a few 2 second pauses but these were during sleeping hours. I will probably repeat a Holter monitor in the future. She'll let me know if she has an increase in palpitations.

## 2011-02-19 NOTE — Patient Instructions (Signed)
Please follow up in 2 months with Dr Antoine Poche  Continue current medications

## 2011-02-19 NOTE — Assessment & Plan Note (Signed)
I agree with current changes to her meds.  I might have to further titrate downward her beta blocker if she continues to have low blood pressures and bradycardia. She will let me know by phone.

## 2011-02-20 ENCOUNTER — Other Ambulatory Visit (INDEPENDENT_AMBULATORY_CARE_PROVIDER_SITE_OTHER): Payer: Medicare Other

## 2011-02-20 DIAGNOSIS — D649 Anemia, unspecified: Secondary | ICD-10-CM

## 2011-02-20 LAB — CBC WITH DIFFERENTIAL/PLATELET
Basophils Absolute: 0 10*3/uL (ref 0.0–0.1)
Eosinophils Absolute: 0.2 10*3/uL (ref 0.0–0.7)
HCT: 36.3 % (ref 36.0–46.0)
Hemoglobin: 12 g/dL (ref 12.0–15.0)
Lymphocytes Relative: 33.2 % (ref 12.0–46.0)
MCHC: 33.1 g/dL (ref 30.0–36.0)
MCV: 97.9 fl (ref 78.0–100.0)
Neutrophils Relative %: 52 % (ref 43.0–77.0)
RDW: 12.1 % (ref 11.5–14.6)
WBC: 7 10*3/uL (ref 4.5–10.5)

## 2011-02-20 LAB — IBC PANEL
Iron: 80 ug/dL (ref 42–145)
Saturation Ratios: 29.9 % (ref 20.0–50.0)
Transferrin: 191.4 mg/dL — ABNORMAL LOW (ref 212.0–360.0)

## 2011-02-20 NOTE — Progress Notes (Signed)
Labs only

## 2011-02-20 NOTE — Progress Notes (Signed)
Labs only Labs only  

## 2011-02-21 ENCOUNTER — Encounter: Payer: Self-pay | Admitting: Family Medicine

## 2011-02-25 ENCOUNTER — Other Ambulatory Visit: Payer: Self-pay | Admitting: Cardiology

## 2011-02-25 ENCOUNTER — Other Ambulatory Visit: Payer: Self-pay | Admitting: Internal Medicine

## 2011-02-25 DIAGNOSIS — I10 Essential (primary) hypertension: Secondary | ICD-10-CM

## 2011-02-25 MED ORDER — METOPROLOL SUCCINATE ER 25 MG PO TB24: 25.0000 mg | ORAL_TABLET | ORAL | Status: DC

## 2011-02-25 NOTE — Telephone Encounter (Signed)
Metoprolol 25 mg. [facility] [phone].

## 2011-02-26 ENCOUNTER — Telehealth: Payer: Self-pay | Admitting: Cardiology

## 2011-02-26 DIAGNOSIS — I10 Essential (primary) hypertension: Secondary | ICD-10-CM

## 2011-02-26 MED ORDER — METOPROLOL SUCCINATE ER 25 MG PO TB24: 25.0000 mg | ORAL_TABLET | Freq: Two times a day (BID) | ORAL | Status: DC

## 2011-02-26 NOTE — Telephone Encounter (Signed)
Refill was sent into pharmacy 02/25/2011  Pt aware.  She now wants a 90 day RX sent into [facility],

## 2011-02-26 NOTE — Telephone Encounter (Signed)
Pt needs to know if metroprolol been sent in yet because if not she will call her PCP to get it filled

## 2011-02-27 ENCOUNTER — Telehealth: Payer: Self-pay | Admitting: Family Medicine

## 2011-02-27 DIAGNOSIS — I10 Essential (primary) hypertension: Secondary | ICD-10-CM

## 2011-02-27 MED ORDER — METOPROLOL SUCCINATE ER 25 MG PO TB24: 25.0000 mg | ORAL_TABLET | Freq: Two times a day (BID) | ORAL | Status: DC

## 2011-02-27 MED ORDER — SIMVASTATIN 40 MG PO TABS: 40.0000 mg | ORAL_TABLET | Freq: Every day | ORAL | Status: DC

## 2011-02-27 NOTE — Telephone Encounter (Signed)
Ok to do refills x 3 as well

## 2011-02-27 NOTE — Telephone Encounter (Signed)
Is it ok for the patient to get the Tdap. Please advise      KP

## 2011-02-27 NOTE — Telephone Encounter (Signed)
Pt also wants to have a Tdap done. Please advise if ok to schedule.

## 2011-02-27 NOTE — Telephone Encounter (Signed)
Pt had been instructed to take 25 mg two tablets twice a day until she saw the cardiologist.  When she saw Dr Antoine Poche he stated that he agreed with the medication change and she should stay on the two tablets twice a day.  Pt states Dr Ernst Spell office sent in the correct RX for her today.   Instructed pt that should she have any problems with the medications or her BP to call back.  She is agreeable.

## 2011-02-27 NOTE — Telephone Encounter (Signed)
It is ok for her to have it but she may want to speak with her ins co first.  Medicare normally will not pay for it for preventative reasons.

## 2011-02-27 NOTE — Telephone Encounter (Signed)
Per pt call pt is confused as to dosage of metoprolol. Pt said Dr. Alwyn Ren prescribed pt to take 2 metoprolol 2x/day. Dr. Antoine Poche prescribed pt to take 1-25 mg metoprolol, 2x/day. Pt is confused as to what dosage and how often pt should take medication. Please return pt call to advise/discuss.

## 2011-02-28 NOTE — Telephone Encounter (Signed)
Tried calling patient VM not set up yet     KP

## 2011-03-04 ENCOUNTER — Other Ambulatory Visit: Payer: Self-pay | Admitting: Family Medicine

## 2011-03-04 NOTE — Telephone Encounter (Signed)
Discussed with patient and she stated she would inquire and see what she can find out     KP

## 2011-03-05 ENCOUNTER — Telehealth: Payer: Self-pay | Admitting: Cardiology

## 2011-03-05 NOTE — Telephone Encounter (Signed)
Refill of metoprolol not sure which dosage he wants, uses [facility]

## 2011-03-21 ENCOUNTER — Ambulatory Visit: Payer: Medicare Other | Admitting: Cardiology

## 2011-03-27 ENCOUNTER — Other Ambulatory Visit: Payer: Self-pay | Admitting: Family Medicine

## 2011-03-27 DIAGNOSIS — E785 Hyperlipidemia, unspecified: Secondary | ICD-10-CM

## 2011-03-27 DIAGNOSIS — E559 Vitamin D deficiency, unspecified: Secondary | ICD-10-CM

## 2011-03-28 ENCOUNTER — Other Ambulatory Visit: Payer: Self-pay | Admitting: Family Medicine

## 2011-03-28 ENCOUNTER — Other Ambulatory Visit (INDEPENDENT_AMBULATORY_CARE_PROVIDER_SITE_OTHER): Payer: Medicare Other

## 2011-03-28 ENCOUNTER — Ambulatory Visit (INDEPENDENT_AMBULATORY_CARE_PROVIDER_SITE_OTHER): Payer: Medicare Other | Admitting: Family Medicine

## 2011-03-28 ENCOUNTER — Encounter: Payer: Self-pay | Admitting: Family Medicine

## 2011-03-28 DIAGNOSIS — E785 Hyperlipidemia, unspecified: Secondary | ICD-10-CM

## 2011-03-28 DIAGNOSIS — R609 Edema, unspecified: Secondary | ICD-10-CM

## 2011-03-28 DIAGNOSIS — F411 Generalized anxiety disorder: Secondary | ICD-10-CM

## 2011-03-28 DIAGNOSIS — I10 Essential (primary) hypertension: Secondary | ICD-10-CM

## 2011-03-28 DIAGNOSIS — D649 Anemia, unspecified: Secondary | ICD-10-CM

## 2011-03-28 DIAGNOSIS — Z0000 Encounter for general adult medical examination without abnormal findings: Secondary | ICD-10-CM

## 2011-03-28 DIAGNOSIS — F419 Anxiety disorder, unspecified: Secondary | ICD-10-CM

## 2011-03-28 DIAGNOSIS — Z23 Encounter for immunization: Secondary | ICD-10-CM

## 2011-03-28 DIAGNOSIS — E559 Vitamin D deficiency, unspecified: Secondary | ICD-10-CM

## 2011-03-28 LAB — BASIC METABOLIC PANEL
BUN: 10 mg/dL (ref 6–23)
Chloride: 106 mEq/L (ref 96–112)
Creatinine, Ser: 0.8 mg/dL (ref 0.4–1.2)
Glucose, Bld: 102 mg/dL — ABNORMAL HIGH (ref 70–99)
Potassium: 3.9 mEq/L (ref 3.5–5.1)

## 2011-03-28 LAB — CBC WITH DIFFERENTIAL/PLATELET
Basophils Absolute: 0 10*3/uL (ref 0.0–0.1)
Eosinophils Absolute: 0.1 10*3/uL (ref 0.0–0.7)
Eosinophils Relative: 1.6 % (ref 0.0–5.0)
HCT: 34.7 % — ABNORMAL LOW (ref 36.0–46.0)
Lymphs Abs: 2.2 10*3/uL (ref 0.7–4.0)
MCV: 97.8 fl (ref 78.0–100.0)
Monocytes Absolute: 0.9 10*3/uL (ref 0.1–1.0)
Neutrophils Relative %: 61.3 % (ref 43.0–77.0)
Platelets: 209 10*3/uL (ref 150.0–400.0)
RDW: 12.7 % (ref 11.5–14.6)
WBC: 8.5 10*3/uL (ref 4.5–10.5)

## 2011-03-28 LAB — HEPATIC FUNCTION PANEL
ALT: 18 U/L (ref 0–35)
Albumin: 3.8 g/dL (ref 3.5–5.2)
Total Bilirubin: 0.2 mg/dL — ABNORMAL LOW (ref 0.3–1.2)

## 2011-03-28 LAB — LIPID PANEL
Cholesterol: 130 mg/dL (ref 0–200)
LDL Cholesterol: 57 mg/dL (ref 0–99)
Triglycerides: 80 mg/dL (ref 0.0–149.0)
VLDL: 16 mg/dL (ref 0.0–40.0)

## 2011-03-28 MED ORDER — DIAZEPAM 10 MG PO TABS: ORAL_TABLET | ORAL | Status: DC

## 2011-03-28 MED ORDER — ZOSTER VACCINE LIVE 19400 UNT/0.65ML ~~LOC~~ SOLR: 0.6500 mL | Freq: Once | SUBCUTANEOUS | Status: DC

## 2011-03-28 MED ORDER — DILTIAZEM HCL ER COATED BEADS 180 MG PO CP24: 180.0000 mg | ORAL_CAPSULE | Freq: Every day | ORAL | Status: DC

## 2011-03-28 MED ORDER — TETANUS-DIPHTH-ACELL PERTUSSIS 5-2.5-18.5 LF-MCG/0.5 IM SUSP: 0.5000 mL | Freq: Once | INTRAMUSCULAR | Status: DC

## 2011-03-28 NOTE — Progress Notes (Signed)
Addended by: Legrand Como on: 03/28/2011 01:42 PM   Modules accepted: Orders

## 2011-03-28 NOTE — Patient Instructions (Signed)
Edema Edema is an abnormal build-up of fluids in tissues. Because this is partly dependent on gravity (water flows to the lowest place), it is more common in the lower extremities (legs and thighs). It is also common in the looser tissues, like around the eyes. Painless swelling of the feet and ankles is common and increases as a person ages. It may affect both legs and may include the calves or even thighs. When squeezed, the fluid may move out of the affected area and may leave a dent for a few moments. CAUSES  Prolonged standing or sitting in one place for extended periods of time. Movement helps pump tissue fluid into the veins, and absence of movement prevents this, resulting in edema.   Varicose veins. The valves in the veins do not work as well as they should. This causes fluid to leak into the tissues.   Fluid and salt overload.   Injury, burn, or surgery to the leg, ankle, or foot, may damage veins and allow fluid to leak out.   Sunburn damages vessels. Leaky vessels allow fluid to go out into the sunburned tissues.   Allergies (from insect bites or stings, medications or chemicals) cause swelling by allowing vessels to become leaky.   Protein in the blood helps keep fluid in your vessels. Low protein, as in malnutrition, allows fluid to leak out.   Hormonal changes, including pregnancy and menstruation, cause fluid retention. This fluid may leak out of vessels and cause edema.   Medications that cause fluid retention. Examples are sex hormones, blood pressure medications, steroid treatment, or anti-depressants.   Some illnesses cause edema, especially heart failure, kidney disease, or liver disease.   Surgery that cuts veins or lymph nodes, such as surgery done for the heart or for breast cancer, may result in edema.  DIAGNOSIS Your caregiver is usually easily able to determine what is causing your swelling (edema) by simply asking what is wrong (getting a history) and examining  you (doing a physical). Sometimes x-rays, EKG (electrocardiogram or heart tracing), and blood work may be done to evaluate for underlying medical illness. TREATMENT General treatment includes:  Leg elevation (or elevation of the affected body part).   Restriction of fluid intake.   Prevention of fluid overload.   Compression of the affected body part. Compression with elastic bandages or support stockings squeezes the tissues, preventing fluid from entering and forcing it back into the blood vessels.   Diuretics (also called water pills or fluid pills) pull fluid out of your body in the form of increased urination. These are effective in reducing the swelling, but can have side effects and must be used only under your caregiver's supervision. Diuretics are appropriate only for some types of edema.  The specific treatment can be directed at any underlying causes discovered. Heart, liver, or kidney disease should be treated appropriately. HOME CARE INSTRUCTIONS  Elevate the legs (or affected body part) above the level of the heart, while lying down.   Avoid sitting or standing still for prolonged periods of time.   Avoid putting anything directly under the knees when lying down, and do not wear constricting clothing or garters on the upper legs.   Exercising the legs causes the fluid to work back into the veins and lymphatic channels. This may help the swelling go down.   The pressure applied by elastic bandages or support stockings can help reduce ankle swelling.   A low-salt diet may help reduce fluid retention and decrease the   ankle swelling.   Take any medications exactly as prescribed.  SEEK MEDICAL CARE IF:  Your edema is not responding to recommended treatments.  SEEK IMMEDIATE MEDICAL CARE IF:  You develop shortness of breath or chest pain.   You cannot breathe when you lay down; or if, while lying down, you have to get up and go to the window to get your breath.   You are  having increasing swelling without relief from treatment.   You develop a fever over 100.4.   You develop pain or redness in the areas that are swollen.   Tell your caregiver right away if you have gained 1-2 lbs in 1 day or  5 lbs in a week.  MAKE SURE YOU:  Understand these instructions.   Will watch your condition.   Will get help right away if you are not doing well or get worse.  Document Released: 06/24/2005 Document Re-Released: 12/12/2009 [employer] Patient Information 2011 [employer]. 

## 2011-03-28 NOTE — Progress Notes (Signed)
  Subjective:    Gabrielle Warren is a 77 y.o. female who presents for evaluation of edema in both lower legs. The edema has been mild. Onset of symptoms was several weeks ago, and patient reports symptoms have gradually improved since that time. The edema is present intermittently. The patient states the problem is long-standing. The swelling has been aggravated by dependency of involved area. The swelling has been relieved by elevation of involved area. Associated factors include: nothing. Cardiac risk factors: advanced age (older than 75 for men, 24 for women), dyslipidemia and hypertension.  The following portions of the patient's history were reviewed and updated as appropriate: allergies, current medications, past family history, past medical history, past social history, past surgical history and problem list.  Review of Systems Pertinent items are noted in HPI.   Objective:    BP 116/60  Pulse 62  Temp(Src) 98.4 F (36.9 C) (Oral)  Wt 107 lb 3.2 oz (48.626 kg)  SpO2 97% General appearance: alert, cooperative and no distress Lungs: clear to auscultation bilaterally Heart: regular rate and rhythm, S1, S2 normal, no murmur, click, rub or gallop Extremities: edema trace edema b/l   Cardiographics ECG: not done  Imaging Chest x-ray: not indicated   Assessment:     Edema ----dependant.    Plan:    Recommendations: decrease sodium in the diet, elevate feet above the level of the heart whenever possible, increase physical activity and use of compression stockings. The patient was also instructed to call IMMEDIATELY (i.e., day or night) if any cardiopulmonary symptoms occur, especially chest pain, shortness of breath, dyspnea on exertion, paroxysmal nocturnal dyspnea, or orthopnea, and these were explained. Follow up in 2 weeks and as needed.

## 2011-03-28 NOTE — Progress Notes (Signed)
Labs only

## 2011-03-29 LAB — IRON AND TIBC
Iron: 73 ug/dL (ref 42–145)
TIBC: 217 ug/dL — ABNORMAL LOW (ref 250–470)
UIBC: 144 ug/dL (ref 125–400)

## 2011-04-23 ENCOUNTER — Encounter: Payer: Self-pay | Admitting: *Deleted

## 2011-04-23 ENCOUNTER — Encounter: Payer: Self-pay | Admitting: Cardiology

## 2011-04-23 ENCOUNTER — Ambulatory Visit (INDEPENDENT_AMBULATORY_CARE_PROVIDER_SITE_OTHER): Payer: Medicare Other | Admitting: Cardiology

## 2011-04-23 DIAGNOSIS — R002 Palpitations: Secondary | ICD-10-CM

## 2011-04-23 DIAGNOSIS — I10 Essential (primary) hypertension: Secondary | ICD-10-CM

## 2011-04-23 NOTE — Progress Notes (Signed)
HPI The patient presents for routine followup. Since I saw her she has been doing well.  She has had none of the palpitations which bothered her before. She has no SOB, PND or orthopnea.  Her BP is much better.  I reviewed her BP diary and for the most part the systolics are in 120s.   Not on File  Current Outpatient Prescriptions  Medication Sig Dispense Refill  . aspirin 81 MG tablet Take 81 mg by mouth daily.        . diazepam (VALIUM) 10 MG tablet 1/2 tablet twice a day  90 tablet  0  . diltiazem (CARDIZEM CD) 180 MG 24 hr capsule Take 1 capsule (180 mg total) by mouth daily.  90 capsule  3  . estrogen, conjugated,-medroxyprogesterone (PREMPRO) 0.3-1.5 MG per tablet Take 1 tablet by mouth daily.        . Ferrous Sulfate (IRON) 90 (18 FE) MG TABS Take 2 tablets by mouth daily.        . fish oil-omega-3 fatty acids 1000 MG capsule Take 2 g by mouth daily.        Marland Kitchen losartan (COZAAR) 100 MG tablet Take 100 mg by mouth daily.        . metoprolol succinate (TOPROL-XL) 25 MG 24 hr tablet Take 1 tablet (25 mg total) by mouth 2 (two) times daily.  180 tablet  3  . mometasone (NASONEX) 50 MCG/ACT nasal spray 2 sprays by Nasal route daily.        . Multiple Vitamin (MULTIVITAMIN) tablet Take 1 tablet by mouth daily.        Marland Kitchen OVER THE COUNTER MEDICATION Calcium-vitamin D  1200-Vitamin D 1000 mg   1 Tab daily       . OVER THE COUNTER MEDICATION Glucosamine -chrondroitin  1,500mg  (BOTH)  Osteo Bi Flex       . simvastatin (ZOCOR) 40 MG tablet TAKE 1 TABLET DAILY  90 tablet  0  . TDaP (BOOSTRIX) 5-2.5-18.5 LF-MCG/0.5 injection Inject 0.5 mLs into the muscle once.  0.5 mL  0  . zoster vaccine live, PF, (ZOSTAVAX) 16109 UNT/0.65ML injection Inject 19,400 Units into the skin once.  1 vial  0  . prazosin (MINIPRESS) 1 MG capsule Take 1 mg by mouth as directed.          Past Medical History  Diagnosis Date  . HYPERTENSION 07/25/2006  . ANXIETY 09/23/2007  . HYPERLIPIDEMIA 07/03/2009  . Atrial tachycardia    . MVP (mitral valve prolapse)   . Arthritis   . Osteoarthritis     Past Surgical History  Procedure Date  . Catart extraction 04/2000, 03/2002  . Carpal tunnel release 09/2004    ROS  As stated in the HPI and negative for all other systems.   PHYSICAL EXAM BP 165/64  Pulse 58  Ht 4\' 11"  (1.499 m)  Wt 106 lb (48.081 kg)  BMI 21.41 kg/m2 GENERAL:  Well appearing NECK:  No jugular venous distention, waveform within normal limits, carotid upstroke brisk and symmetric, no bruits, no thyromegaly LYMPHATICS:  No cervical, inguinal adenopathy LUNGS:  Clear to auscultation bilaterally CHEST:  Unremarkable HEART:  PMI not displaced or sustained,S1 and S2 within normal limits, no S3, no S4, no clicks, no rubs, no murmurs ABD:  Flat, positive bowel sounds normal in frequency in pitch, no bruits, no rebound, no guarding, no midline pulsatile mass, no hepatomegaly, no splenomegaly EXT:  2 plus pulses throughout, no edema, no cyanosis no clubbing  ASSESSMENT AND PLAN

## 2011-04-23 NOTE — Patient Instructions (Signed)
The current medical regimen is effective;  continue present plan and medications.  Follow up in 6 MONTHS with Dr Hochrein.  You will receive a letter in the mail 2 months before you are due.  Please call us when you receive this letter to schedule your follow up appointment 

## 2011-04-23 NOTE — Assessment & Plan Note (Signed)
The patients BP is stable.  She will continue the meds as listed.

## 2011-04-23 NOTE — Assessment & Plan Note (Signed)
She is not currently bothered by these.  She will continue the meds as listed.

## 2011-04-25 ENCOUNTER — Other Ambulatory Visit: Payer: Self-pay | Admitting: Family Medicine

## 2011-04-25 DIAGNOSIS — D649 Anemia, unspecified: Secondary | ICD-10-CM

## 2011-04-26 ENCOUNTER — Encounter: Payer: Self-pay | Admitting: Internal Medicine

## 2011-04-26 ENCOUNTER — Other Ambulatory Visit: Payer: Medicare Other

## 2011-04-26 ENCOUNTER — Ambulatory Visit (INDEPENDENT_AMBULATORY_CARE_PROVIDER_SITE_OTHER)
Admission: RE | Admit: 2011-04-26 | Discharge: 2011-04-26 | Disposition: A | Discharge status: Home / Self Care | Payer: Medicare Other | Source: Ambulatory Visit | Attending: Internal Medicine | Admitting: Internal Medicine

## 2011-04-26 ENCOUNTER — Ambulatory Visit (INDEPENDENT_AMBULATORY_CARE_PROVIDER_SITE_OTHER): Payer: Medicare Other | Admitting: Internal Medicine

## 2011-04-26 DIAGNOSIS — M79602 Pain in left arm: Secondary | ICD-10-CM

## 2011-04-26 DIAGNOSIS — M79609 Pain in unspecified limb: Secondary | ICD-10-CM

## 2011-04-26 DIAGNOSIS — Z136 Encounter for screening for cardiovascular disorders: Secondary | ICD-10-CM

## 2011-04-26 DIAGNOSIS — R0989 Other specified symptoms and signs involving the circulatory and respiratory systems: Secondary | ICD-10-CM

## 2011-04-26 DIAGNOSIS — D649 Anemia, unspecified: Secondary | ICD-10-CM

## 2011-04-26 DIAGNOSIS — I2510 Atherosclerotic heart disease of native coronary artery without angina pectoris: Secondary | ICD-10-CM | POA: Insufficient documentation

## 2011-04-26 DIAGNOSIS — R0609 Other forms of dyspnea: Secondary | ICD-10-CM

## 2011-04-26 LAB — CBC WITH DIFFERENTIAL/PLATELET
Basophils Absolute: 0 10*3/uL (ref 0.0–0.1)
HCT: 33 % — ABNORMAL LOW (ref 36.0–46.0)
Hemoglobin: 11 g/dL — ABNORMAL LOW (ref 12.0–15.0)
Lymphs Abs: 1.5 10*3/uL (ref 0.7–4.0)
MCHC: 33.3 g/dL (ref 30.0–36.0)
MCV: 97.9 fl (ref 78.0–100.0)
Monocytes Absolute: 0.9 10*3/uL (ref 0.1–1.0)
Monocytes Relative: 12.1 % — ABNORMAL HIGH (ref 3.0–12.0)
Neutro Abs: 4.7 10*3/uL (ref 1.4–7.7)
RDW: 13.2 % (ref 11.5–14.6)

## 2011-04-26 LAB — FERRITIN: Ferritin: 171.7 ng/mL (ref 10.0–291.0)

## 2011-04-26 MED ORDER — TRAMADOL HCL 50 MG PO TABS: 50.0000 mg | ORAL_TABLET | Freq: Four times a day (QID) | ORAL | Status: AC | PRN

## 2011-04-26 NOTE — Patient Instructions (Signed)
.  If symptoms persist or progress despite present treatment , go to ER WITH these records  Order for x-rays entered into  the computer; these will be performed at [address] [facility]. across from [facility]. No appointment is necessary.

## 2011-04-26 NOTE — Progress Notes (Signed)
Subjective:    Patient ID: Gabrielle Warren, female    DOB: [birth year], 77 y.o.   MRN: [id number]  HPI Extremity pain Location:LUE Onset:10/18 Trigger/injury:no Pain quality:sharp Pain severity: up to 8 Duration:intermittently gone for a few seconds but lasts hours; basically present 24 hrs3 Radiation:no Exacerbating factors:no but DOE 10/18 with walking. After the walk she did have some headache and noted blood pressure elevation to 173/64. The arm pain did not get worse during the walk Treatment/response:no Rx Review of systems: Constitutional: no fever, chills, sweats Musculoskeletal:no  muscle cramps or pain; no  joint stiffness, redness, or swelling Skin:no rash, color change Neuro: no weakness; incontinence (stool/urine); numbness and tingling Heme:no lymphadenopathy; abnormal bruising or bleeding  PMH : CAD; it presented like this as LUE arm pain. At that time she was having pain in her back.      Review of Systems  She denies any associated chest pain, diaphoresis or nausea with the left arm pain.     Objective:   Physical Exam Gen.: Thin but healthy and well-nourished in appearance. Alert, appropriate and cooperative throughout exam. Head: Normocephalic without obvious abnormalities Eyes: No corneal or conjunctival inflammation noted.  Mouth: Oral mucosa and oropharynx reveal no lesions or exudates. Teeth in good repair. Neck: No deformities, masses, or tenderness noted. Range of motion normal. Lungs: Normal respiratory effort; chest expands symmetrically. Lungs are clear to auscultation without rales, wheezes, or increased work of breathing. Heart: Normal rate and rhythm. Normal S1 and S2. No gallop, click, or rub. Grade1.5 R base systolic  Murmur with R carotid radiation. Abdomen: Bowel sounds normal; abdomen soft and nontender. No masses, organomegaly or hernias noted.                                                                                    Musculoskeletal/extremities: No deformity or scoliosis noted of  the thoracic or lumbar spine. No clubbing, cyanosis, edema, or deformity noted. Range of motion  normal .Tone & strength  Normal.Joints: hand OA changes. Nail health  Good. Mild crepitus of shoulders Vascular: Carotid, radial artery, dorsalis pedis and  posterior tibial pulses are full and equal. No bruits present.Homan's negative Neurologic: Alert and oriented x3. Deep tendon reflexes symmetrical and normal.          Skin: Intact without suspicious lesions or rashes. Lymph: No cervical, axillary  lymphadenopathy present. Psych: Mood and affect are normal. Normally interactive                                                                                        Assessment & Plan:  #1 left upper extremity pain, present essentially 24 hours with minimal remission. Presentation of coronary disease as left arm pain by history. EKG is interpreted as normal. She does have a sinus bradycardia. There are ST-T wave changes  early repolarization. These appear to be present: 02/19/11 EKG. Compared to that EKG T voltage appears improved. There is no ischemic change present  #2 coronary disease, past medical history  #3 dyspnea on exertion  #4 hypertension exacerbation after exercise; blood pressure now controlled Plan: See orders and recommendations

## 2011-04-26 NOTE — Progress Notes (Signed)
Addended by: Legrand Como on: 04/26/2011 01:59 PM   Modules accepted: Orders

## 2011-06-12 ENCOUNTER — Telehealth: Payer: Self-pay | Admitting: Cardiology

## 2011-06-12 DIAGNOSIS — R002 Palpitations: Secondary | ICD-10-CM

## 2011-06-12 NOTE — Telephone Encounter (Signed)
Per pt call - she has been having times when her HR is going up to 120 bpm for about 30 to 40 minutes.  She has not been able to relate it to anything in particular.  It has been occuring about once a week for 3 weeks now.  It is very bothersome.  Pt will have an event monitor placed to determine HR and rhythm.  She is aware Windell Moulding will call her for an appointment.

## 2011-06-12 NOTE — Telephone Encounter (Signed)
New message:  Pt having episodes of tachycardia for the last 3 weeks.  She would like to be seen if at all possible.  These are getting more frequent.  Had an episode last night.  Pt feels ok this am.  Please call and advise.

## 2011-06-13 ENCOUNTER — Encounter (INDEPENDENT_AMBULATORY_CARE_PROVIDER_SITE_OTHER): Payer: Medicare Other

## 2011-06-13 DIAGNOSIS — I498 Other specified cardiac arrhythmias: Secondary | ICD-10-CM

## 2011-06-13 DIAGNOSIS — R002 Palpitations: Secondary | ICD-10-CM

## 2011-06-14 ENCOUNTER — Other Ambulatory Visit: Payer: Self-pay | Admitting: Family Medicine

## 2011-06-20 ENCOUNTER — Encounter: Payer: Self-pay | Admitting: Family Medicine

## 2011-06-20 ENCOUNTER — Ambulatory Visit (INDEPENDENT_AMBULATORY_CARE_PROVIDER_SITE_OTHER): Payer: Medicare Other | Admitting: Family Medicine

## 2011-06-20 DIAGNOSIS — E785 Hyperlipidemia, unspecified: Secondary | ICD-10-CM

## 2011-06-20 DIAGNOSIS — M818 Other osteoporosis without current pathological fracture: Secondary | ICD-10-CM

## 2011-06-20 DIAGNOSIS — I10 Essential (primary) hypertension: Secondary | ICD-10-CM

## 2011-06-20 DIAGNOSIS — R739 Hyperglycemia, unspecified: Secondary | ICD-10-CM

## 2011-06-20 DIAGNOSIS — R7309 Other abnormal glucose: Secondary | ICD-10-CM

## 2011-06-20 DIAGNOSIS — R002 Palpitations: Secondary | ICD-10-CM

## 2011-06-20 DIAGNOSIS — Z0000 Encounter for general adult medical examination without abnormal findings: Secondary | ICD-10-CM

## 2011-06-20 LAB — POCT URINALYSIS DIPSTICK
Blood, UA: NEGATIVE
Ketones, UA: NEGATIVE
Protein, UA: NEGATIVE
Urobilinogen, UA: 0.2
pH, UA: 8

## 2011-06-20 LAB — CBC WITH DIFFERENTIAL/PLATELET
Basophils Absolute: 0 10*3/uL (ref 0.0–0.1)
Eosinophils Absolute: 0.1 10*3/uL (ref 0.0–0.7)
HCT: 35 % — ABNORMAL LOW (ref 36.0–46.0)
Lymphs Abs: 1.3 10*3/uL (ref 0.7–4.0)
MCHC: 34 g/dL (ref 30.0–36.0)
Monocytes Relative: 10.4 % (ref 3.0–12.0)
Platelets: 193 10*3/uL (ref 150.0–400.0)
RDW: 12.5 % (ref 11.5–14.6)

## 2011-06-20 LAB — LIPID PANEL
Cholesterol: 133 mg/dL (ref 0–200)
HDL: 59.2 mg/dL (ref >39.00)
LDL Cholesterol: 60 mg/dL (ref 0–99)
Triglycerides: 71 mg/dL (ref 0.0–149.0)
VLDL: 14.2 mg/dL (ref 0.0–40.0)

## 2011-06-20 LAB — BASIC METABOLIC PANEL
BUN: 13 mg/dL (ref 6–23)
Calcium: 8.8 mg/dL (ref 8.4–10.5)
GFR: 66.08 mL/min (ref >60.00)
Glucose, Bld: 123 mg/dL — ABNORMAL HIGH (ref 70–99)
Potassium: 3.6 mEq/L (ref 3.5–5.1)

## 2011-06-20 LAB — HEPATIC FUNCTION PANEL
AST: 21 U/L (ref 0–37)
Total Bilirubin: 0.5 mg/dL (ref 0.3–1.2)

## 2011-06-20 MED ORDER — METOPROLOL SUCCINATE ER 25 MG PO TB24: 25.0000 mg | ORAL_TABLET | Freq: Two times a day (BID) | ORAL | Status: DC

## 2011-06-20 MED ORDER — DILTIAZEM HCL ER COATED BEADS 180 MG PO CP24: 180.0000 mg | ORAL_CAPSULE | Freq: Every day | ORAL | Status: DC

## 2011-06-20 MED ORDER — PRAZOSIN HCL 1 MG PO CAPS: ORAL_CAPSULE | ORAL | Status: DC

## 2011-06-20 MED ORDER — SIMVASTATIN 40 MG PO TABS: ORAL_TABLET | ORAL | Status: DC

## 2011-06-20 MED ORDER — LOSARTAN POTASSIUM 100 MG PO TABS: 100.0000 mg | ORAL_TABLET | Freq: Every day | ORAL | Status: DC

## 2011-06-20 NOTE — Assessment & Plan Note (Signed)
Currently has monitor on Per Cardio

## 2011-06-20 NOTE — Patient Instructions (Signed)

## 2011-06-20 NOTE — Assessment & Plan Note (Signed)
Stable con't meds 

## 2011-06-20 NOTE — Assessment & Plan Note (Signed)
con't meds con't ca and vita D bmd next year

## 2011-06-20 NOTE — Assessment & Plan Note (Signed)
Check labs con't meds 

## 2011-06-20 NOTE — Progress Notes (Signed)
Subjective:    Gabrielle Warren is a 77 y.o. female who presents for Medicare Annual/Subsequent preventive examination.  Preventive Screening-Counseling & Management  Tobacco History  Smoking status  . Former Smoker  . Quit date: 07/08/1994  Smokeless tobacco  . Not on file     Problems Prior to Visit 1.   Current Problems (verified) Patient Active Problem List  Diagnoses  . TINEA CORPORIS  . IMPAIRED GLUCOSE TOLERANCE  . HYPERLIPIDEMIA  . ANXIETY  . OTOGENIC PAIN  . HYPERTENSION  . EXTERNAL HEMORRHOIDS  . DRY MOUTH  . APHTHOUS STOMATITIS  . CONSTIPATION, CHRONIC  . POSTMENOPAUSAL STATUS  . OSTEOARTHRITIS  . OTHER OSTEOPOROSIS  . PALPITATIONS  . BRUIT  . DYSURIA  . PELVIC  PAIN  . LACERATION, RIGHT THUMB  . CAD (coronary artery disease)    Medications Prior to Visit Current Outpatient Prescriptions on File Prior to Visit  Medication Sig Dispense Refill  . aspirin 81 MG tablet Take 81 mg by mouth daily.        . diazepam (VALIUM) 10 MG tablet 1/2 tablet twice a day  90 tablet  0  . diltiazem (CARDIZEM CD) 180 MG 24 hr capsule Take 1 capsule (180 mg total) by mouth daily.  90 capsule  3  . estrogen, conjugated,-medroxyprogesterone (PREMPRO) 0.3-1.5 MG per tablet Take 1 tablet by mouth daily.        . Ferrous Sulfate (IRON) 90 (18 FE) MG TABS Take 2 tablets by mouth daily.        . fish oil-omega-3 fatty acids 1000 MG capsule Take 2 g by mouth daily.        Marland Kitchen losartan (COZAAR) 100 MG tablet Take 100 mg by mouth daily.        . metoprolol succinate (TOPROL-XL) 25 MG 24 hr tablet Take 1 tablet (25 mg total) by mouth 2 (two) times daily.  180 tablet  3  . mometasone (NASONEX) 50 MCG/ACT nasal spray Place 2 sprays into the nose daily as needed.       . Multiple Vitamin (MULTIVITAMIN) tablet Take 1 tablet by mouth daily.        Marland Kitchen OVER THE COUNTER MEDICATION Calcium-vitamin D  1200-Vitamin D 1000 mg   1 Tab daily       . OVER THE COUNTER MEDICATION Glucosamine  -chrondroitin  1,500mg  (BOTH)  Osteo Bi Flex       . prazosin (MINIPRESS) 1 MG capsule Take 1 mg by mouth as directed.        . simvastatin (ZOCOR) 40 MG tablet TAKE 1 TABLET AT BEDTIME  90 tablet  1  . TDaP (BOOSTRIX) 5-2.5-18.5 LF-MCG/0.5 injection Inject 0.5 mLs into the muscle once.  0.5 mL  0  . traMADol (ULTRAM) 50 MG tablet Take 1 tablet (50 mg total) by mouth every 6 (six) hours as needed for pain.  20 tablet  0  . zoster vaccine live, PF, (ZOSTAVAX) 40981 UNT/0.65ML injection Inject 19,400 Units into the skin once.  1 vial  0    Current Medications (verified) Current Outpatient Prescriptions  Medication Sig Dispense Refill  . aspirin 81 MG tablet Take 81 mg by mouth daily.        . diazepam (VALIUM) 10 MG tablet 1/2 tablet twice a day  90 tablet  0  . diltiazem (CARDIZEM CD) 180 MG 24 hr capsule Take 1 capsule (180 mg total) by mouth daily.  90 capsule  3  . estrogen, conjugated,-medroxyprogesterone (PREMPRO) 0.3-1.5 MG  per tablet Take 1 tablet by mouth daily.        . Ferrous Sulfate (IRON) 90 (18 FE) MG TABS Take 2 tablets by mouth daily.        . fish oil-omega-3 fatty acids 1000 MG capsule Take 2 g by mouth daily.        Marland Kitchen losartan (COZAAR) 100 MG tablet Take 100 mg by mouth daily.        . metoprolol succinate (TOPROL-XL) 25 MG 24 hr tablet Take 1 tablet (25 mg total) by mouth 2 (two) times daily.  180 tablet  3  . mometasone (NASONEX) 50 MCG/ACT nasal spray Place 2 sprays into the nose daily as needed.       . Multiple Vitamin (MULTIVITAMIN) tablet Take 1 tablet by mouth daily.        Marland Kitchen OVER THE COUNTER MEDICATION Calcium-vitamin D  1200-Vitamin D 1000 mg   1 Tab daily       . OVER THE COUNTER MEDICATION Glucosamine -chrondroitin  1,500mg  (BOTH)  Osteo Bi Flex       . prazosin (MINIPRESS) 1 MG capsule Take 1 mg by mouth as directed.        . simvastatin (ZOCOR) 40 MG tablet TAKE 1 TABLET AT BEDTIME  90 tablet  1  . TDaP (BOOSTRIX) 5-2.5-18.5 LF-MCG/0.5 injection Inject 0.5 mLs  into the muscle once.  0.5 mL  0  . traMADol (ULTRAM) 50 MG tablet Take 1 tablet (50 mg total) by mouth every 6 (six) hours as needed for pain.  20 tablet  0  . zoster vaccine live, PF, (ZOSTAVAX) 16109 UNT/0.65ML injection Inject 19,400 Units into the skin once.  1 vial  0  . diltiazem (TIAZAC) 180 MG 24 hr capsule          Allergies (verified) Review of patient's allergies indicates no known allergies.   PAST HISTORY  Family History Family History  Problem Relation Age of Onset  . Heart failure Mother   . Stroke Father   . Hypertension Other     Social History History  Substance Use Topics  . Smoking status: Former Smoker    Quit date: 07/08/1994  . Smokeless tobacco: Not on file  . Alcohol Use: Yes     Are there smokers in your home (other than you)? No  Risk Factors Current exercise habits: The patient does not participate in regular exercise at present.  Dietary issues discussed: na   Cardiac risk factors: advanced age (older than 41 for men, 52 for women), dyslipidemia, hypertension and sedentary lifestyle.  Depression Screen (Note: if answer to either of the following is "Yes", a more complete depression screening is indicated)   Over the past two weeks, have you felt down, depressed or hopeless? No  Over the past two weeks, have you felt little interest or pleasure in doing things? No  Have you lost interest or pleasure in daily life? No  Do you often feel hopeless? No  Do you cry easily over simple problems? No  Activities of Daily Living In your present state of health, do you have any difficulty performing the following activities?:  Driving? No Managing money?  No Feeding yourself? No Getting from bed to chair? No Climbing a flight of stairs? No Preparing food and eating?: No Bathing or showering? No Getting dressed: No Getting to the toilet? No Using the toilet:No Moving around from place to place: No In the past year have you fallen or had a near  fall?:No   Are you sexually active?  No  Do you have more than one partner?  No  Hearing Difficulties: No Do you often ask people to speak up or repeat themselves? No Do you experience ringing or noises in your ears? No Do you have difficulty understanding soft or whispered voices? No   Do you feel that you have a problem with memory? No  Do you often misplace items? No  Do you feel safe at home?  yes  Cognitive Testing  Alert? Yes  Normal Appearance?Yes  Oriented to person? Yes  Place? Yes   Time? Yes  Recall of three objects?  Yes  Can perform simple calculations? Yes  Displays appropriate judgment?Yes  Can read the correct time from a watch face?Yes   Advanced Directives have been discussed with the patient? Yes  List the Names of Other Physician/Practitioners you currently use: 1.  optho-- Dr Nile Riggs  2.  Dentist-- Dr Edward Jolly 3.  Cardio--  Hochrein   Indicate any recent Medical Services you may have received from other than [facility] providers in the past year (date may be approximate).  Immunization History  Administered Date(s) Administered  . Influenza Split 03/28/2011  . Influenza Whole 04/07/2006, 05/06/2007, 03/31/2008, 04/12/2009, 03/27/2010  . Pneumococcal Polysaccharide 06/01/2004  . Td 05/21/1999, 09/26/2008    Screening Tests Health Maintenance  Topic Date Due  . Influenza Vaccine  04/07/2012  . Mammogram  06/09/2012  . Tetanus/tdap  09/27/2018  . Colonoscopy  06/19/2021  . Pneumococcal Polysaccharide Vaccine Age 59 And Over  Completed  . Zostavax  Completed    All answers were reviewed with the patient and necessary referrals were made:  Loreen Freud, DO   06/20/2011   History reviewed: allergies, current medications, past family history, past medical history, past social history, past surgical history and problem list  Review of Systems  Review of Systems  Constitutional: Negative for activity change, appetite change and fatigue.  HENT: Negative  for hearing loss, congestion, tinnitus and ear discharge.   Eyes: Negative for visual disturbance (see optho q1y -- vision corrected to 20/20 with glasses).  Respiratory: Negative for cough, chest tightness and shortness of breath.   Cardiovascular: Negative for chest pain, palpitations and leg swelling.  Gastrointestinal: Negative for abdominal pain, diarrhea, constipation and abdominal distention.  Genitourinary: Negative for urgency, frequency, decreased urine volume and difficulty urinating.  Musculoskeletal: Negative for back pain, arthralgias and gait problem.  Skin: Negative for color change, pallor and rash.  Neurological: Negative for dizziness, light-headedness, numbness and headaches.  Hematological: Negative for adenopathy. Does not bruise/bleed easily.  Psychiatric/Behavioral: Negative for suicidal ideas, confusion, sleep disturbance, self-injury, dysphoric mood, decreased concentration and agitation.  Pt is able to read and write and can do all ADLs No risk for falling No abuse/ violence in home      Objective:     Vision by Snellen chart:  optho  Body mass index is 22.07 kg/(m^2). BP 114/60  Pulse 58  Temp(Src) 97.9 F (36.6 C) (Oral)  Ht 4\' 10"  (1.473 m)  Wt 105 lb 9.6 oz (47.9 kg)  BMI 22.07 kg/m2  SpO2 97%  BP 114/60  Pulse 58  Temp(Src) 97.9 F (36.6 C) (Oral)  Ht 4\' 10"  (1.473 m)  Wt 105 lb 9.6 oz (47.9 kg)  BMI 22.07 kg/m2  SpO2 97% General appearance: alert, cooperative, appears stated age and no distress Head: Normocephalic, without obvious abnormality, atraumatic Eyes: conjunctivae/corneas clear. PERRL, EOM's intact. Fundi benign. Ears: normal TM's  and external ear canals both ears Nose: Nares normal. Septum midline. Mucosa normal. No drainage or sinus tenderness. Throat: lips, mucosa, and tongue normal; teeth and gums normal Neck: no adenopathy, no carotid bruit, no JVD, supple, symmetrical, trachea midline and thyroid not enlarged, symmetric, no  tenderness/mass/nodules Back: no scoliosis present Lungs: clear to auscultation bilaterally Breasts: normal appearance, no masses or tenderness, + tenderness R breast --+ ecchymosis secondary to failed bx Heart: S1, S2 normal Abdomen: soft, non-tender; bowel sounds normal; no masses,  no organomegaly Pelvic: not indicated; post-menopausal, no abnormal Pap smears in past Extremities: extremities normal, atraumatic, no cyanosis or edema Pulses: 2+ and symmetric Skin: Skin color, texture, turgor normal. No rashes or lesions Lymph nodes: Cervical, supraclavicular, and axillary nodes normal. Neurologic: Alert and oriented X 3, normal strength and tone. Normal symmetric reflexes. Normal coordination and gait Psych--no depression or anxiety     Assessment:    cpe     Plan:     During the course of the visit the patient was educated and counseled about appropriate screening and preventive services including:    Pneumococcal vaccine   Influenza vaccine  Td vaccine  Screening mammography  Screening Pap smear and pelvic exam   Bone densitometry screening  Colorectal cancer screening  Advanced directives: has an advanced directive - a copy HAS NOT been provided.  Diet review for nutrition referral? Yes ____  Not Indicated ___x_   Patient Instructions (the written plan) was given to the patient.  Medicare Attestation I have personally reviewed: The patient's medical and social history Their use of alcohol, tobacco or illicit drugs Their current medications and supplements The patient's functional ability including ADLs,fall risks, home safety risks, cognitive, and hearing and visual impairment Diet and physical activities Evidence for depression or mood disorders  The patient's weight, height, BMI, and visual acuity have been recorded in the chart.  I have made referrals, counseling, and provided education to the patient based on review of the above and I have provided the  patient with a written personalized care plan for preventive services.     Loreen Freud, DO   06/20/2011

## 2011-06-21 ENCOUNTER — Encounter: Payer: Self-pay | Admitting: Family Medicine

## 2011-06-21 ENCOUNTER — Telehealth: Payer: Self-pay | Admitting: Cardiology

## 2011-06-21 ENCOUNTER — Other Ambulatory Visit: Payer: Self-pay | Admitting: Cardiology

## 2011-06-21 MED ORDER — DILTIAZEM HCL ER COATED BEADS 120 MG PO CP24: 120.0000 mg | ORAL_CAPSULE | Freq: Every day | ORAL | Status: DC

## 2011-06-21 NOTE — Telephone Encounter (Signed)
I spoke to the patient about a low heart rate on her event monitor.  I suggested that she stop the Diltiazem.  However, she did not did not want to stop this.  She agrees to go reduce the dose to 120 mg daily.  She knows to call me if she has any episodes of presyncope or syncope.  She is to continue wearing the monitor.

## 2011-06-24 ENCOUNTER — Telehealth: Payer: Self-pay | Admitting: Cardiology

## 2011-06-24 ENCOUNTER — Telehealth: Payer: Self-pay | Admitting: Family Medicine

## 2011-06-24 NOTE — Telephone Encounter (Signed)
Msg from patient and she stated she wanted to leave [facility] and wanted to become a patient at the [facility] but she needs a referral. She is scheduled to have a Beast biopsy done tomorrow but she called and cancelled because last Wednesday she had one done and they hit a vein and caused a Hematoma on the breast. She stated she no longer wants to go to [place].  Please advise    KP

## 2011-06-24 NOTE — Telephone Encounter (Signed)
Pt calling re diltiazem 180mg  rx was changed to 120mg  , but 180 mg was still called and and she nneds it corrected and called to [facility] , pls call pt when done

## 2011-06-24 NOTE — Telephone Encounter (Signed)
Please call breast center and see what we need to do since she had a bx scheduled.  We can't call and schedule a bx---I;m sure they are going to need to see all her info first.

## 2011-06-25 ENCOUNTER — Other Ambulatory Visit: Payer: Self-pay | Admitting: Family Medicine

## 2011-06-25 ENCOUNTER — Other Ambulatory Visit: Payer: Self-pay | Admitting: *Deleted

## 2011-06-25 ENCOUNTER — Other Ambulatory Visit: Payer: Federal, State, Local not specified - PPO

## 2011-06-25 DIAGNOSIS — Z1211 Encounter for screening for malignant neoplasm of colon: Secondary | ICD-10-CM

## 2011-06-25 DIAGNOSIS — N6310 Unspecified lump in the right breast, unspecified quadrant: Secondary | ICD-10-CM

## 2011-06-25 MED ORDER — DILTIAZEM HCL ER COATED BEADS 120 MG PO CP24: 120.0000 mg | ORAL_CAPSULE | Freq: Every day | ORAL | Status: DC

## 2011-06-25 NOTE — Telephone Encounter (Signed)
Renee could you please find out what we need to do.  Thank You     KP

## 2011-06-25 NOTE — Telephone Encounter (Signed)
Per [facility], they require all prior records, films, from [facility].  Once they have those, we can refer the patient.  I have called and informed patient.  She is calling [facility] now to have records ready for her to pick-up.  She will let us know once that is done.

## 2011-06-28 ENCOUNTER — Encounter: Payer: Self-pay | Admitting: Family Medicine

## 2011-06-28 ENCOUNTER — Ambulatory Visit (INDEPENDENT_AMBULATORY_CARE_PROVIDER_SITE_OTHER): Payer: Medicare Other | Admitting: Family Medicine

## 2011-06-28 VITALS — BP 140/90 | HR 80 | Temp 97.5°F | Ht <= 58 in | Wt 108.2 lb

## 2011-06-28 DIAGNOSIS — F419 Anxiety disorder, unspecified: Secondary | ICD-10-CM

## 2011-06-28 DIAGNOSIS — F411 Generalized anxiety disorder: Secondary | ICD-10-CM

## 2011-06-28 DIAGNOSIS — N390 Urinary tract infection, site not specified: Secondary | ICD-10-CM

## 2011-06-28 DIAGNOSIS — R208 Other disturbances of skin sensation: Secondary | ICD-10-CM

## 2011-06-28 DIAGNOSIS — R209 Unspecified disturbances of skin sensation: Secondary | ICD-10-CM

## 2011-06-28 LAB — POCT URINALYSIS DIPSTICK
Glucose, UA: NEGATIVE
Urobilinogen, UA: 0.2

## 2011-06-28 MED ORDER — CEPHALEXIN 500 MG PO CAPS: 500.0000 mg | ORAL_CAPSULE | Freq: Two times a day (BID) | ORAL | Status: AC

## 2011-06-28 MED ORDER — DIAZEPAM 10 MG PO TABS: ORAL_TABLET | ORAL | Status: DC

## 2011-06-28 NOTE — Patient Instructions (Signed)
This is a UTI Take the keflex twice daily- w/ food Drink plenty of fluids Call with any questions or concerns Happy Holidays!

## 2011-06-28 NOTE — Assessment & Plan Note (Signed)
Pt's sxs and UA consistent w/ infxn.  Start abx.  Reviewed supportive care and red flags that should prompt return.  Pt expressed understanding and is in agreement w/ plan.  

## 2011-06-28 NOTE — Progress Notes (Signed)
  Subjective:    Patient ID: Gabrielle Warren, female    DOB: 03-29-1926, 77 y.o.   MRN: [id number]  HPI UTI- sxs started yesterday.  + dysuria, pelvic pain, frequency, urgency, hesitancy.  No fevers, back pain.  Hx of similar.   Review of Systems For ROS see HPI     Objective:   Physical Exam  Vitals reviewed. Constitutional: She appears well-developed and well-nourished. No distress.  Abdominal: Soft. She exhibits no distension. There is no tenderness (no suprapubic or CVA tenderness).          Assessment & Plan:

## 2011-06-30 LAB — URINE CULTURE

## 2011-07-01 ENCOUNTER — Encounter: Payer: Self-pay | Admitting: Family Medicine

## 2011-07-05 ENCOUNTER — Other Ambulatory Visit: Payer: Self-pay | Admitting: Family Medicine

## 2011-07-05 ENCOUNTER — Ambulatory Visit
Admission: RE | Admit: 2011-07-05 | Discharge: 2011-07-05 | Disposition: A | Discharge status: Home / Self Care | Payer: Medicare Other | Source: Ambulatory Visit | Attending: Family Medicine | Admitting: Family Medicine

## 2011-07-05 ENCOUNTER — Ambulatory Visit
Admission: RE | Admit: 2011-07-05 | Discharge: 2011-07-05 | Disposition: A | Discharge status: Home / Self Care | Payer: Federal, State, Local not specified - PPO | Source: Ambulatory Visit | Attending: Family Medicine | Admitting: Family Medicine

## 2011-07-05 DIAGNOSIS — N6310 Unspecified lump in the right breast, unspecified quadrant: Secondary | ICD-10-CM

## 2011-07-08 ENCOUNTER — Ambulatory Visit
Admission: RE | Admit: 2011-07-08 | Discharge: 2011-07-08 | Disposition: A | Discharge status: Home / Self Care | Payer: Medicare Other | Source: Ambulatory Visit | Attending: Family Medicine | Admitting: Family Medicine

## 2011-07-08 ENCOUNTER — Other Ambulatory Visit: Payer: Self-pay | Admitting: Family Medicine

## 2011-07-08 DIAGNOSIS — C50919 Malignant neoplasm of unspecified site of unspecified female breast: Secondary | ICD-10-CM

## 2011-07-08 DIAGNOSIS — N6310 Unspecified lump in the right breast, unspecified quadrant: Secondary | ICD-10-CM

## 2011-07-10 ENCOUNTER — Telehealth: Payer: Self-pay | Admitting: Cardiology

## 2011-07-10 NOTE — Telephone Encounter (Signed)
New Msg: Pt calling stating that her MD recently put pt on dilitazem 120 mg, however pt has noticed a rise in pt BP into 140's and 158.Pt would like to discuss this with MD and the possibility of changing pt medication.   Please return pt call to discuss further.

## 2011-07-10 NOTE — Telephone Encounter (Signed)
Pt called to report her BP has been running high lately from 140 to 158 over 55 to 67.  Her hr has been in the 50's and low 60's.  Pt reports BP has been up since decrease Diltiazem to 120 mg a day from 180 mg a day d/t pauses.  Instructed pt that I will review with Dr Antoine Poche on his return but for now she is not in any danger of CVA.  She will  Continue to monitor her BP.  She does admit to being under a lot not stress since having an abnormal mammogram and breast biopsy.  She will be having an MRI soon and a meeting with 3 MDs at [facility] to discuss her treatment options.

## 2011-07-11 ENCOUNTER — Other Ambulatory Visit: Payer: Self-pay | Admitting: *Deleted

## 2011-07-11 ENCOUNTER — Telehealth: Payer: Self-pay | Admitting: *Deleted

## 2011-07-11 DIAGNOSIS — C50219 Malignant neoplasm of upper-inner quadrant of unspecified female breast: Secondary | ICD-10-CM

## 2011-07-11 NOTE — Telephone Encounter (Signed)
Confirmed [facility] for 07/17/11 at 1215.  Instructions and contact information given.  

## 2011-07-12 ENCOUNTER — Ambulatory Visit
Admission: RE | Admit: 2011-07-12 | Discharge: 2011-07-12 | Disposition: A | Discharge status: Home / Self Care | Payer: Medicare Other | Source: Ambulatory Visit | Attending: Family Medicine | Admitting: Family Medicine

## 2011-07-12 DIAGNOSIS — C50919 Malignant neoplasm of unspecified site of unspecified female breast: Secondary | ICD-10-CM

## 2011-07-12 MED ORDER — GADOBENATE DIMEGLUMINE 529 MG/ML IV SOLN
9.0000 mL | Freq: Once | INTRAVENOUS | Status: AC | PRN
  Administered 2011-07-12: 9 mL via INTRAVENOUS

## 2011-07-15 ENCOUNTER — Other Ambulatory Visit: Payer: Self-pay | Admitting: Family Medicine

## 2011-07-15 ENCOUNTER — Ambulatory Visit
Admission: RE | Admit: 2011-07-15 | Discharge: 2011-07-15 | Disposition: A | Discharge status: Home / Self Care | Payer: Medicare Other | Source: Ambulatory Visit | Attending: Family Medicine | Admitting: Family Medicine

## 2011-07-15 DIAGNOSIS — R928 Other abnormal and inconclusive findings on diagnostic imaging of breast: Secondary | ICD-10-CM

## 2011-07-15 NOTE — Telephone Encounter (Signed)
Continue with current medications

## 2011-07-15 NOTE — Telephone Encounter (Signed)
Attempted to call pt but went right to a notification that the pt's voice mail has not be set up yet.  Will continue to attempt to contact pt.

## 2011-07-17 ENCOUNTER — Encounter (INDEPENDENT_AMBULATORY_CARE_PROVIDER_SITE_OTHER): Payer: Self-pay | Admitting: Surgery

## 2011-07-17 ENCOUNTER — Encounter: Payer: Self-pay | Admitting: *Deleted

## 2011-07-17 ENCOUNTER — Encounter: Payer: Self-pay | Admitting: Specialist

## 2011-07-17 ENCOUNTER — Encounter: Payer: Self-pay | Admitting: Radiation Oncology

## 2011-07-17 ENCOUNTER — Other Ambulatory Visit (HOSPITAL_BASED_OUTPATIENT_CLINIC_OR_DEPARTMENT_OTHER): Payer: Medicare Other

## 2011-07-17 ENCOUNTER — Ambulatory Visit
Admission: RE | Admit: 2011-07-17 | Discharge: 2011-07-17 | Disposition: A | Discharge status: Home / Self Care | Payer: Medicare Other | Source: Ambulatory Visit | Attending: Radiation Oncology | Admitting: Radiation Oncology

## 2011-07-17 ENCOUNTER — Ambulatory Visit: Payer: Medicare Other

## 2011-07-17 ENCOUNTER — Ambulatory Visit (HOSPITAL_BASED_OUTPATIENT_CLINIC_OR_DEPARTMENT_OTHER): Payer: Medicare Other | Admitting: Oncology

## 2011-07-17 ENCOUNTER — Ambulatory Visit: Payer: Medicare Other | Admitting: Physical Therapy

## 2011-07-17 ENCOUNTER — Ambulatory Visit (HOSPITAL_BASED_OUTPATIENT_CLINIC_OR_DEPARTMENT_OTHER): Payer: Medicare Other | Admitting: Surgery

## 2011-07-17 VITALS — BP 161/81 | HR 67 | Temp 97.8°F | Ht <= 58 in | Wt 105.1 lb

## 2011-07-17 DIAGNOSIS — C50919 Malignant neoplasm of unspecified site of unspecified female breast: Secondary | ICD-10-CM

## 2011-07-17 DIAGNOSIS — C50119 Malignant neoplasm of central portion of unspecified female breast: Secondary | ICD-10-CM | POA: Insufficient documentation

## 2011-07-17 DIAGNOSIS — N63 Unspecified lump in unspecified breast: Secondary | ICD-10-CM

## 2011-07-17 DIAGNOSIS — Z171 Estrogen receptor negative status [ER-]: Secondary | ICD-10-CM

## 2011-07-17 DIAGNOSIS — C50219 Malignant neoplasm of upper-inner quadrant of unspecified female breast: Secondary | ICD-10-CM

## 2011-07-17 DIAGNOSIS — C50911 Malignant neoplasm of unspecified site of right female breast: Secondary | ICD-10-CM

## 2011-07-17 LAB — COMPREHENSIVE METABOLIC PANEL
ALT: 20 U/L (ref 0–35)
AST: 20 U/L (ref 0–37)
Albumin: 3.6 g/dL (ref 3.5–5.2)
CO2: 30 mEq/L (ref 19–32)
Calcium: 9.7 mg/dL (ref 8.4–10.5)
Chloride: 103 mEq/L (ref 96–112)
Potassium: 4 mEq/L (ref 3.5–5.3)
Sodium: 138 mEq/L (ref 135–145)
Total Protein: 7.4 g/dL (ref 6.0–8.3)

## 2011-07-17 LAB — CANCER ANTIGEN 27.29: CA 27.29: 11 U/mL (ref 0–39)

## 2011-07-17 LAB — CBC WITH DIFFERENTIAL/PLATELET
BASO%: 0.7 % (ref 0.0–2.0)
EOS%: 1.9 % (ref 0.0–7.0)
HCT: 35 % (ref 34.8–46.6)
MCH: 32.3 pg (ref 25.1–34.0)
MCHC: 33.6 g/dL (ref 31.5–36.0)
MONO#: 0.7 10*3/uL (ref 0.1–0.9)
RDW: 12.3 % (ref 11.2–14.5)
WBC: 5.8 10*3/uL (ref 3.9–10.3)
lymph#: 1.4 10*3/uL (ref 0.9–3.3)

## 2011-07-17 NOTE — Progress Notes (Signed)
Met pt and her family at the multi-disciplinary clinic.  Explained available support services, including breast cancer support group and [facility].

## 2011-07-17 NOTE — Progress Notes (Signed)
Gabrielle Warren  DOB: 1926-07-04  MR#: [id number]  CSN#: [id number]    History of present illness:   Gabrielle Warren is an 78 year old [place] woman who on screening mammography at [facility] was found to have a suspicious mass. I do not have those records, but additional views confirmed the abnormality andb iopsy was attempted 06/19/2011, complicated by bleeding. The patient was then referred to the breast Center whereultrasound-guided biopsy was performed 07/05/2011. This showed an invasive ductal carcinoma, grade 1, HER-2 negative, 100% estrogen receptor positive, 86% progesterone receptor positive, with an MIB-1 of 8%.the patient was then referred for bilateral breast MRIs which showed a 9 mm spiculated enhancing mass in the middle third of the right breast. There was an associated clip artifact. In the contralateral breast, a 5 mm mass was found which was irregular and nodular, with washout genetics. Biopsy of the second mass under MRI guidance (it could not be visualized under ultrasound) is scheduled for January 10.  The patient now presents to the multi-disciplinary breast cancer clinic for definitive treatment plan.  Past medical history:      Past Medical History  Diagnosis Date  . HYPERTENSION 07/25/2006  . ANXIETY 09/23/2007  . HYPERLIPIDEMIA 07/03/2009  . Atrial tachycardia   . MVP (mitral valve prolapse)   . Arthritis   . Osteoarthritis   coronary artery disease, status post remote heart attack (1308)  Past surgical history:      Past Surgical History  Procedure Date  . Catart extraction 04/2000, 03/2002  . Carpal tunnel release 09/2004    Family history:     Family History  Problem Relation Age of Onset  . Heart failure Mother   . Stroke Father   . Hypertension Other   the patient's father died suddenly at the age of 80, of unknown causes. The patient's mother died at the age of 33 from complications following a leg amputation. The patient had 2 brothers  and 2 sisters. There is no history of breast or ovarian cancer in the immediate family  Gynecologic history: the patient had menarche age 58, last menstrual period 36. She took Prempro for many years, stopping only in December of 2012 with this diagnosis. The patient is GX P3, first pregnancy to term age 89.    Social history:  The patient's main name was [place]. Her family originally came from [place]. 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 Her 3 children are present today. Daughter Gabrielle Warren age 83 works in [place] as a Comptroller. Son Gabrielle Warren 53 lives in [place] and is a retired Comptroller Gabrielle Warren, Gabrielle Warren. Signed Gabrielle Noa( "Bill") 54, lives in [place] and works in Film/video editor. The patient has no grandchildren. She is alone at home except for a CAT, but tells me that her daughter Gabrielle Warren lives nearby.     ADVANCED DIRECTIVES: yes  Health maintenance:       History  Substance Use Topics  . Smoking status: Former Smoker    Quit date: 07/08/1994  . Smokeless tobacco: Not on file  . Alcohol Use: Yes      Colonoscopy: never  PAP: 2009  Bone density: 2012  Cholesterol:  Review of systems: she has occasional palpitations, which can last up to half an hour. She has been started on metoprolol for this. She feels easily fatigued, but manages to get through all her normal daily activities. She has some hip pain in the right side which has been going on for quite a  while, has not increased in intensity or frequency. She has occasional problems with hemorrhoids and urinary tract infections. She feels a little forgetful but is otherwise fully functional and a detailed review of systems is otherwise noncontributory  Allergies:     Allergies  Allergen Reactions  . Fosamax (Alendronate Sodium) Other (See Comments)    Canker sores and bleeding gums    Medications:      Current Outpatient Prescriptions  Medication Sig Dispense Refill  . aspirin 81  MG tablet Take 81 mg by mouth daily.        . diazepam (VALIUM) 10 MG tablet 1/2 tablet twice a day  90 tablet  0  . diltiazem (CARDIZEM CD) 120 MG 24 hr capsule Take 1 capsule (120 mg total) by mouth daily.  90 capsule  3  . Ferrous Sulfate (IRON) 90 (18 FE) MG TABS Take 2 tablets by mouth daily.        . fish oil-omega-3 fatty acids 1000 MG capsule Take 2 g by mouth daily.        Marland Kitchen losartan (COZAAR) 100 MG tablet Take 1 tablet (100 mg total) by mouth daily.  90 tablet  3  . metoprolol succinate (TOPROL-XL) 25 MG 24 hr tablet Take 1 tablet (25 mg total) by mouth 2 (two) times daily.  180 tablet  3  . mometasone (NASONEX) 50 MCG/ACT nasal spray Place 2 sprays into the nose daily as needed.       . Multiple Vitamin (MULTIVITAMIN) tablet Take 1 tablet by mouth daily.        Marland Kitchen OVER THE COUNTER MEDICATION Calcium-vitamin D  1200-Vitamin D 1000 mg   1 Tab daily       . OVER THE COUNTER MEDICATION Glucosamine -chrondroitin  1,500mg  (BOTH)  Osteo Bi Flex       . prazosin (MINIPRESS) 1 MG capsule 1 po qhs  90 capsule  3  . simvastatin (ZOCOR) 40 MG tablet 1 po qhs  90 tablet  3  . diltiazem (TIAZAC) 180 MG 24 hr capsule       . TDaP (BOOSTRIX) 5-2.5-18.5 LF-MCG/0.5 injection Inject 0.5 mLs into the muscle once.  0.5 mL  0  . traMADol (ULTRAM) 50 MG tablet Take 1 tablet (50 mg total) by mouth every 6 (six) hours as needed for pain.  20 tablet  0  . zoster vaccine live, PF, (ZOSTAVAX) 16109 UNT/0.65ML injection Inject 19,400 Units into the skin once.  1 vial  0    Physical exam:  Elderly white woman who appears in good health    Filed Vitals:   07/17/11 1259  BP: 161/81  Pulse: 67  Temp: 97.8 F (36.6 C)     Body mass index is 23.56 kg/(m^2).  ECOG PS: 0  Sclerae unicteric Oropharynx clear No peripheral adenopathy, specifically no palpable abnormalities in either axilla Lungs no rales or rhonchi Heart regular rate and rhythm Abd benign MSK no focal spinal tenderness, no peripheral  edema Neuro: nonfocal Breasts: right breast, status post recent biopsy. There is some ecchymosis, but no palpable mass, no skin change of concern, no nipple retraction. The left breast was unremarkable, with no palpable mass.   Lab results:      CBC  Lab 07/17/11 1229  WBC 5.8  HGB 11.8  HCT 35.0  PLT 185  MCV 96.1  MCH 32.3  MCHC 33.6  RDW 12.3  LYMPHSABS 1.4  MONOABS 0.7  EOSABS 0.1  BASOSABS 0.0  BANDABS --  Chemistries   Lab 07/17/11 1229  NA 138  K 4.0  CL 103  CO2 30  GLUCOSE 114*  BUN 12  CREATININE 0.80  CALCIUM 9.7  MG --    Studies:    Mr Breast Bilateral W Wo Contrast  07/12/2011  *RADIOLOGY REPORT*  Clinical Data: Recent diagnosis of invasive ductal carcinoma of the right breast which at that the following ultrasound-guided biopsy of a 9 mm in this mass in the 1 o'clock position.  BILATERAL BREAST MRI WITH AND WITHOUT CONTRAST  Technique: Multiplanar, multisequence MR images of both breasts were obtained prior to and following the intravenous administration of 9ml of Multihance.  Three dimensional images were evaluated at the independent DynaCad workstation.  Comparison:  Bilateral mammogram 06/10/2011 and diagnostic right mammogram right breast ultrasound 06/13/2011 for cells were felt. Post-biopsy clip mammogram of the right breast 07/05/2011 from the breast center.  Findings:   There is a mild background parenchymal enhancement pattern bilaterally.  In the middle third of the slightly medial right breast near the level of the nipple is a 9 x 7 x 7 mm spiculated enhancing mass with washout kinetics.  This mass contains biopsy clip artifact and corresponds to the biopsy-proven carcinoma.  There are no additional areas of suspicious enhancement in the right breast. There is no skin thickening.  In the middle third of the slightly medial left breast, just inferior to the level of the nipple with the patient in the MRI scanner is an irregular nodular area of  enhancement that measures 5 x 5 x4  mm and demonstrates washout kinetics.  The remainder of the left breast is negative.  Negative for axillary or internal mammary chain lymphadenopathy.  IMPRESSION:  1.  Solitary 9 mm spiculated mass in the 1 o'clock position of the right breast corresponds to the biopsy-proven carcinoma. 2.  6 mm nodular area of enhancement with washout kinetics in the slightly medial central left breast.  Malignancy cannot be excluded.  A second look ultrasound is recommended.  If this is sonographically occult, then MRI-guided core needle biopsy is suggested.  The patient will be contacted for second look ultrasound.  3.  Negative for lymphadenopathy.  THREE-DIMENSIONAL MR IMAGE RENDERING ON INDEPENDENT WORKSTATION:  Three-dimensional MR images were rendered by post-processing of the original MR data on an independent workstation.  The three- dimensional MR images were interpreted, and findings were reported in the accompanying complete MRI report for this study.  BI-RADS CATEGORY 6:  Known biopsy-proven malignancy - appropriate action should be taken.  Original Report Authenticated By: Britta Mccreedy, M.D.     Assessment: 78 year old [place] woman status post right breast biopsy December of 2012 for a clinical T1b N0, stage I invasive ductal carcinoma, strongly estrogen and progesterone receptor positive, HER-2 negative, with an MIB-1 of 8; also with 5 mm left breast mass, not seen on ultrasound, with MRI guided biopsy planned for January 10.        Plan: we spent the better part of the hour-long visit today discussing the biology of her tumor and she understands she very likely has a luminal A type breast cancer.patients with this type of breast cancer generally have a very good prognosis.Taking this into account as well as her age, I do not think an Oncotype (suggested by NCCN guidelines) would serve any purpose. I also do not believe she would need sentinel lymph node biopsy as it  would not change my management. If she takes antiestrogen therapy, I also  feel she could forego radiation treatments.  We then discussed anti-estrogen therapy in detail. Aromatase inhibitors are a concern because of her history of osteoporosis and intolerance of bisphosphonates. The fact that she took estrogen replacement for many years without any clotting concerns is encouraging as we consider tamoxifen. She does have a good understanding of the possible toxicities side effects and complications of that medication. I have made her a return appointment to see me in approximately 3 weeks, by which time we should have all the information available to make a definitive decision. MAGRINAT,GUSTAV C 07/17/2011

## 2011-07-17 NOTE — Progress Notes (Signed)
[facility] Radiation Oncology NEW PATIENT EVALUATION  Name: Gabrielle Warren MRN: [id number]  Date: 07/17/2011  DOB: [birth year]  Status: outpatient   CC: Gabrielle Freud, DO, DO  Gabrielle Rubenstein, MD Gabrielle Magrinat, MD   REFERRING PHYSICIAN: Shelly Rubenstein, MD   DIAGNOSIS: Clinical T1 B N0 M0 right central breast cancer, grade 1, invasive ductal carcinoma, ER/PR positive HER-2/neu negative  Contralateral breast lesion, biopsy pending     HISTORY OF PRESENT ILLNESS:  Gabrielle Warren is a 78 y.o. female who has undergone screening mammograms on a regular basis without any palpated lumps or bumps in her breasts. Her most recent screening mammogram showed an abnormality in her right breast. She underwent an attempted biopsy in 06/19/2011 but had quite a bit of bleeding and the biopsy was unsuccessful. This was reattempted with ultrasound guide core needle biopsy on 07/05/2011. The biopsy revealed invasive ductal carcinoma which appears to be grade 1. It is ER and PR positive and HER-2/neu negative. 07/12/2011 she underwent a MRI of her breasts. It showed that in the middle third of the slightly medial right breast near the level of the nipple there was a 9 x 7 x 7 mm spiculated enhancing mass containing the biopsy clip. Otherwise the breast appeared normal. The lymph nodes and internal mammary chain appeared normal. A 6 mm nodular area of enhancement was also appreciated in the slightly medial central left breast. A core needle biopsy by ultrasound guidance will be performed for this tomorrow.  PREVIOUS RADIATION THERAPY: No  GYN history: She had her first live birth at age 22 and has carried 3 children to term. Her age at first menstrual cycle was at 11-12. Her last period was in 1984. She has used hormone replacement therapy and she stopped this in December 2012.   PAST MEDICAL HISTORY:  has a past medical history of HYPERTENSION (07/25/2006); ANXIETY (09/23/2007);  HYPERLIPIDEMIA (07/03/2009); Atrial tachycardia; MVP (mitral valve prolapse); Arthritis; and Osteoarthritis.     PAST SURGICAL HISTORY:  Past Surgical History  Procedure Date  . Catart extraction 04/2000, 03/2002  . Carpal tunnel release 09/2004     FAMILY HISTORY: family history includes Heart failure in her mother; Hypertension in her other; and Stroke in her father.   SOCIAL HISTORY:  reports that she quit smoking about 17 years ago. She does not have any smokeless tobacco history on file. She reports that she drinks alcohol. She reports that she does not use illicit drugs.   ALLERGIES: Fosamax   MEDICATIONS:  Current Outpatient Prescriptions  Medication Sig Dispense Refill  . aspirin 81 MG tablet Take 81 mg by mouth daily.        . diazepam (VALIUM) 10 MG tablet 1/2 tablet twice a day  90 tablet  0  . diltiazem (CARDIZEM CD) 120 MG 24 hr capsule Take 1 capsule (120 mg total) by mouth daily.  90 capsule  3  . diltiazem (TIAZAC) 180 MG 24 hr capsule       . estrogen, conjugated,-medroxyprogesterone (PREMPRO) 0.3-1.5 MG per tablet Take 1 tablet by mouth daily.        . Ferrous Sulfate (IRON) 90 (18 FE) MG TABS Take 2 tablets by mouth daily.        . fish oil-omega-3 fatty acids 1000 MG capsule Take 2 g by mouth daily.        Marland Kitchen losartan (COZAAR) 100 MG tablet Take 1 tablet (100 mg total) by mouth daily.  90 tablet  3  . metoprolol succinate (TOPROL-XL) 25 MG 24 hr tablet Take 1 tablet (25 mg total) by mouth 2 (two) times daily.  180 tablet  3  . mometasone (NASONEX) 50 MCG/ACT nasal spray Place 2 sprays into the nose daily as needed.       . Multiple Vitamin (MULTIVITAMIN) tablet Take 1 tablet by mouth daily.        Marland Kitchen OVER THE COUNTER MEDICATION Calcium-vitamin D  1200-Vitamin D 1000 mg   1 Tab daily       . OVER THE COUNTER MEDICATION Glucosamine -chrondroitin  1,500mg  (BOTH)  Osteo Bi Flex       . prazosin (MINIPRESS) 1 MG capsule 1 po qhs  90 capsule  3  . simvastatin (ZOCOR) 40  MG tablet 1 po qhs  90 tablet  3  . TDaP (BOOSTRIX) 5-2.5-18.5 LF-MCG/0.5 injection Inject 0.5 mLs into the muscle once.  0.5 mL  0  . traMADol (ULTRAM) 50 MG tablet Take 1 tablet (50 mg total) by mouth every 6 (six) hours as needed for pain.  20 tablet  0  . zoster vaccine live, PF, (ZOSTAVAX) 04540 UNT/0.65ML injection Inject 19,400 Units into the skin once.  1 vial  0      REVIEW OF SYSTEMS:  A comprehensive 14 point review of systems is performed. It is positive for fatigue as well as:  muscle aches  glasses for vision  ringing in her ears  sinus drip  hoarse voice  mouth sores on her gums  feet swelling  irregular heartbeat  palpitations  hemorrhoids  urinary tract infections  easy bruising  back pain  joint pain  arthritis  forgetfulness  Anxiety  and anemia    PHYSICAL EXAM: She is 4 foot 8 and 105 pounds. Blood pressure 161/81 temperature 97.8 pulse 67 respiratory rate 20  General: Alert and oriented, in no acute distress; she is well appearing HEENT: Head is normocephalic. Pupils are equally round and reactive to light. Extraocular movements are intact. Oropharynx is clear. Neck: Neck is supple, no palpable cervical or supraclavicular lymphadenopathy. Heart: Regular in rate and rhythm with no murmurs, rubs, or gallops. Chest: Clear to auscultation bilaterally, with no rhonchi, wheezes, or rales. Abdomen: Soft, nontender, nondistended, with no rigidity or guarding. Extremities: No cyanosis or edema. Lymphatics: No concerning lymphadenopathy. Skin: No concerning lesions. Musculoskeletal: symmetric strength and muscle tone throughout. Neurologic: Cranial nerves II through XII are grossly intact. No obvious focalities. Speech is fluent. Coordination is intact. Psychiatric: Judgment and insight are intact. Affect is appropriate. Breast exam is notable for postbiopsy bruising and a hematoma in the central breast underneath the right nipple. I cannot appreciate any  palpable lesions in the left breast. I cannot appreciate any palpable regional adenopathy    LABORATORY DATA:  Lab Results  Component Value Date   WBC 5.8 07/17/2011   HGB 11.8 07/17/2011   HCT 35.0 07/17/2011   MCV 96.1 07/17/2011   PLT 185 07/17/2011   Lab Results  Component Value Date   NA 138 07/17/2011   K 4.0 07/17/2011   CL 103 07/17/2011   CO2 30 07/17/2011   Lab Results  Component Value Date   ALT 20 07/17/2011   AST 20 07/17/2011   ALKPHOS 51 07/17/2011   BILITOT 0.5 07/17/2011   Radiology as above  Pathology as above   IMPRESSION/PLAN: Is a very pleasant 78 year old woman who is in very good health. She is independent with her activities of daily living. She  lives alone. She has a good prognosis.   I spoke to the patient about mastectomy versus breast conservation therapy. She understands that her life expectancy would be unchanged regardless of which surgery she chooses. She is enthusiastic about breast conservation therapy. Tomorrow she'll undergo a biopsy of her left breast. I explained to her that will likely be a good candidate for a right breast lumpectomy and possibly a left breast lumpectomy if she is found to have a malignancy in the contralateral breast. Assuming that she does not have any signs of hormone receptor negative cancer in the left breast, she'll probably be able to opt for hormonal therapy alone after surgery. I spoke about the study that randomized elderly women with hormone receptor positive breast cancer, stage I, to hormonal therapy alone versus adjuvant hormonal therapy and radiotherapy. The benefits of  radiotherapy were relatively small and only for local control. Women who were randomized radiotherapy as part of their care did not live any longer than the women who had hormonal therapy alone.  However, if she is found to have positive margins after surgery or if she is unlikely to tolerate a five-year regimen of hormonal therapy, I would like to rediscuss radiotherapy  with her. I will be in touch with medical oncology in this respect and available to see her for followup at any time necessary.  It was a pleasure meeting the patient and she has  my contact information she has any questions in the interim. In the meantime, I told her to stop her hormonal replacement therapy (Prempro) which she has been on for a while for bone loss. She'll discuss this further with Dr. Darnelle Catalan.

## 2011-07-17 NOTE — Progress Notes (Signed)
Patient ID: Gabrielle Warren, female   DOB: [birth year], 78 y.o.   MRN: [id number]  Chief Complaint  Patient presents with  . Other    right breast cancer    HPI Gabrielle Warren is a 78 y.o. female.   HPI She is referred by Dr. Laury Axon for evaluation of a right breast cancer. This was recently found on mammogram. She has has had MRI and a biopsy showing the ER and PR positive invasive malignancy. There was a suspicious area seen on the left breast and a biopsy is planned tomorrow for this lesion. She has had no previous problems with her breast. She has no nipple discharge. She is otherwise without complaints. Past Medical History  Diagnosis Date  . HYPERTENSION 07/25/2006  . ANXIETY 09/23/2007  . HYPERLIPIDEMIA 07/03/2009  . Atrial tachycardia   . MVP (mitral valve prolapse)   . Arthritis   . Osteoarthritis     Past Surgical History  Procedure Date  . Catart extraction 04/2000, 03/2002  . Carpal tunnel release 09/2004    Family History  Problem Relation Age of Onset  . Heart failure Mother   . Stroke Father   . Hypertension Other     Social History History  Substance Use Topics  . Smoking status: Former Smoker    Quit date: 07/08/1994  . Smokeless tobacco: Not on file  . Alcohol Use: Yes    Allergies  Allergen Reactions  . Fosamax (Alendronate Sodium) Other (See Comments)    Canker sores and bleeding gums    Current Outpatient Prescriptions  Medication Sig Dispense Refill  . aspirin 81 MG tablet Take 81 mg by mouth daily.        . diazepam (VALIUM) 10 MG tablet 1/2 tablet twice a day  90 tablet  0  . diltiazem (CARDIZEM CD) 120 MG 24 hr capsule Take 1 capsule (120 mg total) by mouth daily.  90 capsule  3  . diltiazem (TIAZAC) 180 MG 24 hr capsule       . estrogen, conjugated,-medroxyprogesterone (PREMPRO) 0.3-1.5 MG per tablet Take 1 tablet by mouth daily.        . Ferrous Sulfate (IRON) 90 (18 FE) MG TABS Take 2 tablets by mouth daily.        . fish  oil-omega-3 fatty acids 1000 MG capsule Take 2 g by mouth daily.        Marland Kitchen losartan (COZAAR) 100 MG tablet Take 1 tablet (100 mg total) by mouth daily.  90 tablet  3  . metoprolol succinate (TOPROL-XL) 25 MG 24 hr tablet Take 1 tablet (25 mg total) by mouth 2 (two) times daily.  180 tablet  3  . mometasone (NASONEX) 50 MCG/ACT nasal spray Place 2 sprays into the nose daily as needed.       . Multiple Vitamin (MULTIVITAMIN) tablet Take 1 tablet by mouth daily.        Marland Kitchen OVER THE COUNTER MEDICATION Calcium-vitamin D  1200-Vitamin D 1000 mg   1 Tab daily       . OVER THE COUNTER MEDICATION Glucosamine -chrondroitin  1,500mg  (BOTH)  Osteo Bi Flex       . prazosin (MINIPRESS) 1 MG capsule 1 po qhs  90 capsule  3  . simvastatin (ZOCOR) 40 MG tablet 1 po qhs  90 tablet  3  . TDaP (BOOSTRIX) 5-2.5-18.5 LF-MCG/0.5 injection Inject 0.5 mLs into the muscle once.  0.5 mL  0  . traMADol (ULTRAM) 50 MG tablet Take  1 tablet (50 mg total) by mouth every 6 (six) hours as needed for pain.  20 tablet  0  . zoster vaccine live, PF, (ZOSTAVAX) 16109 UNT/0.65ML injection Inject 19,400 Units into the skin once.  1 vial  0    Review of Systems Review of Systems  Constitutional: Positive for fatigue. Negative for fever, chills and unexpected weight change.  HENT: Positive for sinus pressure. Negative for hearing loss, congestion, sore throat, trouble swallowing and voice change.   Eyes: Negative for visual disturbance.  Respiratory: Negative for cough and wheezing.   Cardiovascular: Negative for chest pain, palpitations and leg swelling.  Gastrointestinal: Negative for nausea, vomiting, abdominal pain, diarrhea, constipation, blood in stool, abdominal distention and anal bleeding.  Genitourinary: Negative for hematuria, vaginal bleeding and difficulty urinating.  Musculoskeletal: Positive for myalgias and back pain. Negative for arthralgias.  Skin: Negative for rash and wound.  Neurological: Negative for seizures,  syncope and headaches.  Hematological: Negative for adenopathy. Does not bruise/bleed easily.  Psychiatric/Behavioral: Negative for confusion.    There were no vitals taken for this visit.  Physical Exam Physical Exam  Constitutional: She is oriented to person, place, and time. She appears well-developed and well-nourished. No distress.  HENT:  Head: Normocephalic and atraumatic.  Right Ear: External ear normal.  Left Ear: External ear normal.  Nose: Nose normal.  Mouth/Throat: Oropharynx is clear and moist. No oropharyngeal exudate.  Eyes: Conjunctivae are normal. Pupils are equal, round, and reactive to light. Left eye exhibits no discharge. No scleral icterus.  Neck: Normal range of motion. Neck supple. No tracheal deviation present. No thyromegaly present.  Cardiovascular: Normal rate, regular rhythm, normal heart sounds and intact distal pulses.  Exam reveals no gallop.   No murmur heard. Pulmonary/Chest: Effort normal and breath sounds normal. No respiratory distress. She has no wheezes.  Abdominal: Soft. Bowel sounds are normal. She exhibits no distension. There is no tenderness.  Musculoskeletal: Normal range of motion. She exhibits no edema and no tenderness.  Lymphadenopathy:    She has no cervical adenopathy.    She has no axillary adenopathy.       Right: No supraclavicular adenopathy present.       Left: No supraclavicular adenopathy present.  Neurological: She is alert and oriented to person, place, and time.  Skin: Skin is warm and dry. No rash noted. No erythema.  Psychiatric: Her behavior is normal. Judgment normal.  Breasts: There are no palpable masses in either breast. There is bruising around the areola of the right breast from her biopsy.  Data Reviewed I have the mammograms, MRI results, and biopsy results showing the invasive right breast cancer. There is a suspicious area in the left breast on MRI which is quite small. Biopsy of this is  pending  Assessment    Invasive right breast cancer and a suspicious area in the left breast    Plan    At this point we are awaiting the biopsy results. She is a candidate for breast conservation even if the left area is positive for malignancy. Left breast is positive, we recommended needle localized lumpectomy of both areas. We are now recommending sentinel node biopsy. We are also not recommending radiation at this point. I discussed the risk of surgery which includes but is not limited to bleeding, infection, need for further surgery if the margins are positive, et Karie Soda. She understands and wishes to proceed. We will schedule the definitive surgery once the final biopsy results are known.  Dyon Rotert A 07/17/2011, 3:18 PM

## 2011-07-18 ENCOUNTER — Ambulatory Visit
Admission: RE | Admit: 2011-07-18 | Discharge: 2011-07-18 | Disposition: A | Discharge status: Home / Self Care | Payer: Medicare Other | Source: Ambulatory Visit | Attending: Family Medicine | Admitting: Family Medicine

## 2011-07-18 ENCOUNTER — Encounter: Payer: Self-pay | Admitting: Family Medicine

## 2011-07-18 DIAGNOSIS — R928 Other abnormal and inconclusive findings on diagnostic imaging of breast: Secondary | ICD-10-CM

## 2011-07-18 DIAGNOSIS — C50919 Malignant neoplasm of unspecified site of unspecified female breast: Secondary | ICD-10-CM | POA: Insufficient documentation

## 2011-07-18 MED ORDER — GADOBENATE DIMEGLUMINE 529 MG/ML IV SOLN
9.0000 mL | Freq: Once | INTRAVENOUS | Status: AC | PRN
  Administered 2011-07-18: 9 mL via INTRAVENOUS

## 2011-07-18 NOTE — Telephone Encounter (Signed)
Pt aware to continue current medications.

## 2011-07-23 ENCOUNTER — Encounter: Payer: Self-pay | Admitting: Family Medicine

## 2011-07-23 ENCOUNTER — Other Ambulatory Visit (INDEPENDENT_AMBULATORY_CARE_PROVIDER_SITE_OTHER): Payer: Self-pay | Admitting: Surgery

## 2011-07-23 ENCOUNTER — Encounter: Payer: Self-pay | Admitting: *Deleted

## 2011-07-23 DIAGNOSIS — C50911 Malignant neoplasm of unspecified site of right female breast: Secondary | ICD-10-CM

## 2011-07-23 NOTE — Progress Notes (Signed)
Mailed after appt letter to pt. 

## 2011-07-24 ENCOUNTER — Other Ambulatory Visit (INDEPENDENT_AMBULATORY_CARE_PROVIDER_SITE_OTHER): Payer: Self-pay | Admitting: Surgery

## 2011-07-24 DIAGNOSIS — C50911 Malignant neoplasm of unspecified site of right female breast: Secondary | ICD-10-CM

## 2011-07-25 ENCOUNTER — Telehealth: Payer: Self-pay | Admitting: *Deleted

## 2011-07-25 NOTE — Telephone Encounter (Signed)
Spoke with pt concerning [facility] from 07/17/11.  Pt denies questions or concerns at this time.  Discussed staging, prognostic panel and clinical staging.  Confirmed surgery date and f/u appt with Dr. Darnelle Catalan.  Encourage pt to call with needs.  Received verbal understanding.  Contact information given.  Printed off calender and sent to pt home address.

## 2011-08-01 ENCOUNTER — Telehealth (INDEPENDENT_AMBULATORY_CARE_PROVIDER_SITE_OTHER): Payer: Self-pay

## 2011-08-01 NOTE — Telephone Encounter (Signed)
The patient called and states she has issues with her blood pressure and she plans to take her pill the morning of surgery 1/31.  She always has to take it in the morning.  I told her a lot of times the hospital wants people to take bp pills am of surgery with a sip of water.  I asked her to discuss this with them when they call to make her preop appointment.  She did not realize she had to go to the hospital preop.  I told her I would let Dr Magnus Ivan know.

## 2011-08-02 ENCOUNTER — Encounter (HOSPITAL_BASED_OUTPATIENT_CLINIC_OR_DEPARTMENT_OTHER): Payer: Self-pay | Admitting: *Deleted

## 2011-08-02 NOTE — Progress Notes (Signed)
To come in for bmet 

## 2011-08-06 ENCOUNTER — Encounter (HOSPITAL_BASED_OUTPATIENT_CLINIC_OR_DEPARTMENT_OTHER)
Admission: RE | Admit: 2011-08-06 | Discharge: 2011-08-06 | Disposition: A | Discharge status: Home / Self Care | Payer: Medicare Other | Source: Ambulatory Visit | Attending: Surgery | Admitting: Surgery

## 2011-08-06 LAB — BASIC METABOLIC PANEL
CO2: 28 mEq/L (ref 19–32)
Calcium: 9.3 mg/dL (ref 8.4–10.5)
Creatinine, Ser: 0.72 mg/dL (ref 0.50–1.10)
GFR calc Af Amer: 90 mL/min — ABNORMAL LOW (ref >90)
GFR calc non Af Amer: 77 mL/min — ABNORMAL LOW (ref >90)
Sodium: 138 mEq/L (ref 135–145)

## 2011-08-08 ENCOUNTER — Encounter (HOSPITAL_BASED_OUTPATIENT_CLINIC_OR_DEPARTMENT_OTHER): Payer: Self-pay | Admitting: Anesthesiology

## 2011-08-08 ENCOUNTER — Ambulatory Visit (HOSPITAL_BASED_OUTPATIENT_CLINIC_OR_DEPARTMENT_OTHER): Payer: Medicare Other | Admitting: Anesthesiology

## 2011-08-08 ENCOUNTER — Encounter (HOSPITAL_BASED_OUTPATIENT_CLINIC_OR_DEPARTMENT_OTHER)
Admission: RE | Disposition: A | Discharge status: Home / Self Care | Payer: Self-pay | Source: Ambulatory Visit | Attending: Surgery

## 2011-08-08 ENCOUNTER — Encounter (HOSPITAL_BASED_OUTPATIENT_CLINIC_OR_DEPARTMENT_OTHER): Payer: Self-pay

## 2011-08-08 ENCOUNTER — Ambulatory Visit
Admission: RE | Admit: 2011-08-08 | Discharge: 2011-08-08 | Disposition: A | Discharge status: Home / Self Care | Payer: Medicare Other | Source: Ambulatory Visit | Attending: Surgery | Admitting: Surgery

## 2011-08-08 ENCOUNTER — Other Ambulatory Visit (INDEPENDENT_AMBULATORY_CARE_PROVIDER_SITE_OTHER): Payer: Self-pay | Admitting: Surgery

## 2011-08-08 ENCOUNTER — Ambulatory Visit (HOSPITAL_BASED_OUTPATIENT_CLINIC_OR_DEPARTMENT_OTHER)
Admission: RE | Admit: 2011-08-08 | Discharge: 2011-08-08 | Disposition: A | Discharge status: Home / Self Care | Payer: Medicare Other | Source: Ambulatory Visit | Attending: Surgery | Admitting: Surgery

## 2011-08-08 DIAGNOSIS — Z01812 Encounter for preprocedural laboratory examination: Secondary | ICD-10-CM | POA: Insufficient documentation

## 2011-08-08 DIAGNOSIS — E785 Hyperlipidemia, unspecified: Secondary | ICD-10-CM | POA: Insufficient documentation

## 2011-08-08 DIAGNOSIS — C50919 Malignant neoplasm of unspecified site of unspecified female breast: Secondary | ICD-10-CM

## 2011-08-08 DIAGNOSIS — I10 Essential (primary) hypertension: Secondary | ICD-10-CM | POA: Insufficient documentation

## 2011-08-08 DIAGNOSIS — C50911 Malignant neoplasm of unspecified site of right female breast: Secondary | ICD-10-CM

## 2011-08-08 HISTORY — PX: BREAST SURGERY: SHX581

## 2011-08-08 HISTORY — PX: BREAST LUMPECTOMY: SHX2

## 2011-08-08 SURGERY — BREAST LUMPECTOMY WITH NEEDLE LOCALIZATION
Anesthesia: General | Site: Breast | Laterality: Right | Wound class: Clean

## 2011-08-08 MED ORDER — MIDAZOLAM HCL 2 MG/2ML IJ SOLN: 1.0000 mg | INTRAMUSCULAR | Status: DC | PRN

## 2011-08-08 MED ORDER — LACTATED RINGERS IV SOLN
INTRAVENOUS | Status: DC
  Administered 2011-08-08: 11:00:00 via INTRAVENOUS

## 2011-08-08 MED ORDER — FENTANYL CITRATE 0.05 MG/ML IJ SOLN
INTRAMUSCULAR | Status: DC | PRN
  Administered 2011-08-08: 50 ug via INTRAVENOUS

## 2011-08-08 MED ORDER — PROPOFOL 10 MG/ML IV EMUL
INTRAVENOUS | Status: DC | PRN
  Administered 2011-08-08: 200 mg via INTRAVENOUS

## 2011-08-08 MED ORDER — DEXAMETHASONE SODIUM PHOSPHATE 4 MG/ML IJ SOLN
INTRAMUSCULAR | Status: DC | PRN
  Administered 2011-08-08: 10 mg via INTRAVENOUS

## 2011-08-08 MED ORDER — HYDROCODONE-ACETAMINOPHEN 5-325 MG PO TABS: 1.0000 | ORAL_TABLET | Freq: Four times a day (QID) | ORAL | Status: DC | PRN

## 2011-08-08 MED ORDER — ONDANSETRON HCL 4 MG/2ML IJ SOLN
INTRAMUSCULAR | Status: DC | PRN
  Administered 2011-08-08: 4 mg via INTRAVENOUS

## 2011-08-08 MED ORDER — LIDOCAINE HCL (CARDIAC) 20 MG/ML IV SOLN
INTRAVENOUS | Status: DC | PRN
  Administered 2011-08-08: 100 mg via INTRAVENOUS

## 2011-08-08 MED ORDER — BUPIVACAINE-EPINEPHRINE 0.5% -1:200000 IJ SOLN
INTRAMUSCULAR | Status: DC | PRN
  Administered 2011-08-08: 10 mL

## 2011-08-08 MED ORDER — HYDROCODONE-ACETAMINOPHEN 5-325 MG PO TABS: 1.0000 | ORAL_TABLET | Freq: Once | ORAL | Status: DC | PRN

## 2011-08-08 MED ORDER — FENTANYL CITRATE 0.05 MG/ML IJ SOLN: 50.0000 ug | INTRAMUSCULAR | Status: DC | PRN

## 2011-08-08 MED ORDER — CEFAZOLIN SODIUM 1-5 GM-% IV SOLN
1.0000 g | INTRAVENOUS | Status: AC
  Administered 2011-08-08: 1 g via INTRAVENOUS

## 2011-08-08 SURGICAL SUPPLY — 38 items
APL SKNCLS STERI-STRIP NONHPOA (GAUZE/BANDAGES/DRESSINGS) ×1
BENZOIN TINCTURE PRP APPL 2/3 (GAUZE/BANDAGES/DRESSINGS) ×1 IMPLANT
BLADE HEX COATED 2.75 (ELECTRODE) ×1 IMPLANT
BLADE SURG 15 STRL LF DISP TIS (BLADE) IMPLANT
BLADE SURG 15 STRL SS (BLADE) ×2
CANISTER SUCTION 1200CC (MISCELLANEOUS) ×1 IMPLANT
CHLORAPREP W/TINT 26ML (MISCELLANEOUS) ×1 IMPLANT
CLOSURE STERI STRIP 1/2 X4 (GAUZE/BANDAGES/DRESSINGS) ×1 IMPLANT
CLOTH BEACON ORANGE TIMEOUT ST (SAFETY) ×1 IMPLANT
COVER MAYO STAND STRL (DRAPES) ×1 IMPLANT
COVER TABLE BACK 60X90 (DRAPES) ×1 IMPLANT
DEVICE DUBIN W/COMP PLATE 8390 (MISCELLANEOUS) ×1 IMPLANT
DRAPE PED LAPAROTOMY (DRAPES) ×1 IMPLANT
DRAPE UTILITY XL STRL (DRAPES) ×1 IMPLANT
DRSG TEGADERM 2-3/8X2-3/4 SM (GAUZE/BANDAGES/DRESSINGS) ×1 IMPLANT
DRSG TEGADERM 4X4.75 (GAUZE/BANDAGES/DRESSINGS) ×1 IMPLANT
ELECT REM PT RETURN 9FT ADLT (ELECTROSURGICAL) ×2
ELECTRODE REM PT RTRN 9FT ADLT (ELECTROSURGICAL) IMPLANT
GAUZE SPONGE 4X4 12PLY STRL LF (GAUZE/BANDAGES/DRESSINGS) ×1 IMPLANT
GLOVE BIO SURGEON STRL SZ7 (GLOVE) ×1 IMPLANT
GLOVE SURG SIGNA 7.5 PF LTX (GLOVE) ×1 IMPLANT
GOWN PREVENTION PLUS XLARGE (GOWN DISPOSABLE) ×1 IMPLANT
GOWN PREVENTION PLUS XXLARGE (GOWN DISPOSABLE) ×2 IMPLANT
KIT MARKER MARGIN INK (KITS) ×1 IMPLANT
NS IRRIG 1000ML POUR BTL (IV SOLUTION) ×1 IMPLANT
PACK BASIN DAY SURGERY FS (CUSTOM PROCEDURE TRAY) ×1 IMPLANT
PENCIL BUTTON HOLSTER BLD 10FT (ELECTRODE) ×1 IMPLANT
SLEEVE SCD COMPRESS KNEE MED (MISCELLANEOUS) ×1 IMPLANT
SPONGE GAUZE 4X4 FOR O.R. (GAUZE/BANDAGES/DRESSINGS) ×1 IMPLANT
SPONGE LAP 4X18 X RAY DECT (DISPOSABLE) ×1 IMPLANT
SUT MNCRL AB 4-0 PS2 18 (SUTURE) ×1 IMPLANT
SUT VIC AB 3-0 SH 27 (SUTURE) ×2
SUT VIC AB 3-0 SH 27X BRD (SUTURE) IMPLANT
SYR CONTROL 10ML LL (SYRINGE) ×1 IMPLANT
TOWEL OR 17X24 6PK STRL BLUE (TOWEL DISPOSABLE) ×1 IMPLANT
TOWEL OR NON WOVEN STRL DISP B (DISPOSABLE) ×1 IMPLANT
TUBE CONNECTING 20X1/4 (TUBING) ×1 IMPLANT
YANKAUER SUCT BULB TIP NO VENT (SUCTIONS) ×1 IMPLANT

## 2011-08-08 NOTE — Interval H&P Note (Signed)
History and Physical Interval Note: she has had no change in her history or exam  08/08/2011 11:09 AM  Gabrielle Warren  has presented today for surgery, with the diagnosis of right breast cancer  The various methods of treatment have been discussed with the patient and family. After consideration of risks, benefits and other options for treatment, the patient has consented to  Procedure(s):  RIGHT BREAST LUMPECTOMY WITH NEEDLE LOCALIZATION as a surgical intervention .  The patients' history has been reviewed, patient examined, no change in status, stable for surgery.  I have reviewed the patients' chart and labs.  Questions were answered to the patient's satisfaction.     Blessed Cotham A

## 2011-08-08 NOTE — Op Note (Signed)
08/08/2011  12:21 PM  PATIENT:  Gabrielle Warren  78 y.o. female  PRE-OPERATIVE DIAGNOSIS:  right breast cancer  POST-OPERATIVE DIAGNOSIS:  * No post-op diagnosis entered *  PROCEDURE:  Procedure(s): BREAST LUMPECTOMY WITH NEEDLE LOCALIZATION  SURGEON:  Surgeon(s): Shelly Rubenstein, MD  PHYSICIAN ASSISTANT:   ASSISTANTS: none   ANESTHESIA:   general  EBL:  Total I/O In: 100 [I.V.:100] Out: -   BLOOD ADMINISTERED:none  DRAINS: none   LOCAL MEDICATIONS USED:  MARCAINE 10CC  SPECIMEN:  Excision  DISPOSITION OF SPECIMEN:  PATHOLOGY  COUNTS:  YES  TOURNIQUET:  * No tourniquets in log *  DICTATION: .Dragon Dictation Indications: This is a 78 year old female found to have an invasive breast cancer in her right breast. The decision has been made to proceed with a needle localized right breast lumpectomy.  Findings: The suspicious area was confirmed to be in the lumpectomy specimen by radiology.  Procedure: The patient was brought to the operating room and identified as the correct patient. She had already had a localization wire placed by the radiologist at the breast center. She was placed supine on the operating room table. General anesthesia was induced. Her right breast and prepped and draped in the usual sterile fashion. A transverse incision in the inner upper quadrant of the right breast. Incorporated localization wire... Scan. An internist as the breast tissue with the cautery. The wire track to underneath the areola. Form a Y. Lobectomy would only down to the chest wall incorporating the entire localization wire. Lumpectomy specimen was removed. I marked all the margins peripherally. I did mark the medial lateral margins would be up staplers. Because of thickened tissue I decided to take more lateral and superior margin. X-ray confirmed that the suspicious area was well within the lumpectomy specimen. I then irrigated with saline. Hemostasis was achieved with  cautery. I then closed the subcutaneous tissue with interrupted 0 Vicryl sutures and then closed the skin with a running 4-0 Monocryl suture. Steri-Strips, gauze, and tape were then applied. The patient tolerated the procedure well. All counts were correct at the end of the procedure. The patient was then extubated in the operating room and taken in a stable condition to the recovery room. PLAN OF CARE: Discharge to home after PACU  PATIENT DISPOSITION:  PACU - hemodynamically stable.   Delay start of Pharmacological VTE agent (>24hrs) due to surgical blood loss or risk of bleeding:  {YES/NO/NOT APPLICABLE:20182

## 2011-08-08 NOTE — H&P (View-Only) (Signed)
Patient ID: Gabrielle Warren, female   DOB: 10/02/1925, 78 y.o.   MRN: [id number]  Chief Complaint  Patient presents with  . Other    right breast cancer    HPI Gabrielle Warren is a 78 y.o. female.   HPI She is referred by Dr. Lowne for evaluation of a right breast cancer. This was recently found on mammogram. She has has had MRI and a biopsy showing the ER and PR positive invasive malignancy. There was a suspicious area seen on the left breast and a biopsy is planned tomorrow for this lesion. She has had no previous problems with her breast. She has no nipple discharge. She is otherwise without complaints. Past Medical History  Diagnosis Date  . HYPERTENSION 07/25/2006  . ANXIETY 09/23/2007  . HYPERLIPIDEMIA 07/03/2009  . Atrial tachycardia   . MVP (mitral valve prolapse)   . Arthritis   . Osteoarthritis     Past Surgical History  Procedure Date  . Catart extraction 04/2000, 03/2002  . Carpal tunnel release 09/2004    Family History  Problem Relation Age of Onset  . Heart failure Mother   . Stroke Father   . Hypertension Other     Social History History  Substance Use Topics  . Smoking status: Former Smoker    Quit date: 07/08/1994  . Smokeless tobacco: Not on file  . Alcohol Use: Yes    Allergies  Allergen Reactions  . Fosamax (Alendronate Sodium) Other (See Comments)    Canker sores and bleeding gums    Current Outpatient Prescriptions  Medication Sig Dispense Refill  . aspirin 81 MG tablet Take 81 mg by mouth daily.        . diazepam (VALIUM) 10 MG tablet 1/2 tablet twice a day  90 tablet  0  . diltiazem (CARDIZEM CD) 120 MG 24 hr capsule Take 1 capsule (120 mg total) by mouth daily.  90 capsule  3  . diltiazem (TIAZAC) 180 MG 24 hr capsule       . estrogen, conjugated,-medroxyprogesterone (PREMPRO) 0.3-1.5 MG per tablet Take 1 tablet by mouth daily.        . Ferrous Sulfate (IRON) 90 (18 FE) MG TABS Take 2 tablets by mouth daily.        . fish  oil-omega-3 fatty acids 1000 MG capsule Take 2 g by mouth daily.        . losartan (COZAAR) 100 MG tablet Take 1 tablet (100 mg total) by mouth daily.  90 tablet  3  . metoprolol succinate (TOPROL-XL) 25 MG 24 hr tablet Take 1 tablet (25 mg total) by mouth 2 (two) times daily.  180 tablet  3  . mometasone (NASONEX) 50 MCG/ACT nasal spray Place 2 sprays into the nose daily as needed.       . Multiple Vitamin (MULTIVITAMIN) tablet Take 1 tablet by mouth daily.        . OVER THE COUNTER MEDICATION Calcium-vitamin D  1200-Vitamin D 1000 mg   1 Tab daily       . OVER THE COUNTER MEDICATION Glucosamine -chrondroitin  1,500mg (BOTH)  Osteo Bi Flex       . prazosin (MINIPRESS) 1 MG capsule 1 po qhs  90 capsule  3  . simvastatin (ZOCOR) 40 MG tablet 1 po qhs  90 tablet  3  . TDaP (BOOSTRIX) 5-2.5-18.5 LF-MCG/0.5 injection Inject 0.5 mLs into the muscle once.  0.5 mL  0  . traMADol (ULTRAM) 50 MG tablet Take   1 tablet (50 mg total) by mouth every 6 (six) hours as needed for pain.  20 tablet  0  . zoster vaccine live, PF, (ZOSTAVAX) 19400 UNT/0.65ML injection Inject 19,400 Units into the skin once.  1 vial  0    Review of Systems Review of Systems  Constitutional: Positive for fatigue. Negative for fever, chills and unexpected weight change.  HENT: Positive for sinus pressure. Negative for hearing loss, congestion, sore throat, trouble swallowing and voice change.   Eyes: Negative for visual disturbance.  Respiratory: Negative for cough and wheezing.   Cardiovascular: Negative for chest pain, palpitations and leg swelling.  Gastrointestinal: Negative for nausea, vomiting, abdominal pain, diarrhea, constipation, blood in stool, abdominal distention and anal bleeding.  Genitourinary: Negative for hematuria, vaginal bleeding and difficulty urinating.  Musculoskeletal: Positive for myalgias and back pain. Negative for arthralgias.  Skin: Negative for rash and wound.  Neurological: Negative for seizures,  syncope and headaches.  Hematological: Negative for adenopathy. Does not bruise/bleed easily.  Psychiatric/Behavioral: Negative for confusion.    There were no vitals taken for this visit.  Physical Exam Physical Exam  Constitutional: She is oriented to person, place, and time. She appears well-developed and well-nourished. No distress.  HENT:  Head: Normocephalic and atraumatic.  Right Ear: External ear normal.  Left Ear: External ear normal.  Nose: Nose normal.  Mouth/Throat: Oropharynx is clear and moist. No oropharyngeal exudate.  Eyes: Conjunctivae are normal. Pupils are equal, round, and reactive to light. Left eye exhibits no discharge. No scleral icterus.  Neck: Normal range of motion. Neck supple. No tracheal deviation present. No thyromegaly present.  Cardiovascular: Normal rate, regular rhythm, normal heart sounds and intact distal pulses.  Exam reveals no gallop.   No murmur heard. Pulmonary/Chest: Effort normal and breath sounds normal. No respiratory distress. She has no wheezes.  Abdominal: Soft. Bowel sounds are normal. She exhibits no distension. There is no tenderness.  Musculoskeletal: Normal range of motion. She exhibits no edema and no tenderness.  Lymphadenopathy:    She has no cervical adenopathy.    She has no axillary adenopathy.       Right: No supraclavicular adenopathy present.       Left: No supraclavicular adenopathy present.  Neurological: She is alert and oriented to person, place, and time.  Skin: Skin is warm and dry. No rash noted. No erythema.  Psychiatric: Her behavior is normal. Judgment normal.  Breasts: There are no palpable masses in either breast. There is bruising around the areola of the right breast from her biopsy.  Data Reviewed I have the mammograms, MRI results, and biopsy results showing the invasive right breast cancer. There is a suspicious area in the left breast on MRI which is quite small. Biopsy of this is  pending  Assessment    Invasive right breast cancer and a suspicious area in the left breast    Plan    At this point we are awaiting the biopsy results. She is a candidate for breast conservation even if the left area is positive for malignancy. Left breast is positive, we recommended needle localized lumpectomy of both areas. We are now recommending sentinel node biopsy. We are also not recommending radiation at this point. I discussed the risk of surgery which includes but is not limited to bleeding, infection, need for further surgery if the margins are positive, et cetera. She understands and wishes to proceed. We will schedule the definitive surgery once the final biopsy results are known.         Blossie Raffel A 07/17/2011, 3:18 PM    

## 2011-08-08 NOTE — Anesthesia Procedure Notes (Signed)
Procedure Name: LMA Insertion Date/Time: 08/08/2011 11:43 AM Performed by: Meyer Russel Pre-anesthesia Checklist: Patient identified, Emergency Drugs available, Suction available, Patient being monitored and Timeout performed Patient Re-evaluated:Patient Re-evaluated prior to inductionOxygen Delivery Method: Circle System Utilized Preoxygenation: Pre-oxygenation with 100% oxygen Intubation Type: IV induction Ventilation: Mask ventilation without difficulty LMA: LMA inserted LMA Size: 4.0 Number of attempts: 1 Airway Equipment and Method: bite block Placement Confirmation: positive ETCO2 and breath sounds checked- equal and bilateral Tube secured with: Tape Dental Injury: Teeth and Oropharynx as per pre-operative assessment

## 2011-08-08 NOTE — Anesthesia Preprocedure Evaluation (Addendum)
Anesthesia Evaluation  Patient identified by MRN, date of birth, ID band Patient awake    Reviewed: Allergy & Precautions, H&P , NPO status , Patient's Chart, lab work & pertinent test results  Airway Mallampati: I TM Distance: >3 FB Neck ROM: Full    Dental   Pulmonary    Pulmonary exam normal       Cardiovascular hypertension, + CAD     Neuro/Psych Anxiety    GI/Hepatic   Endo/Other    Renal/GU      Musculoskeletal   Abdominal   Peds  Hematology   Anesthesia Other Findings   Reproductive/Obstetrics                           Anesthesia Physical Anesthesia Plan  ASA: III  Anesthesia Plan: General   Post-op Pain Management:    Induction: Intravenous  Airway Management Planned: LMA  Additional Equipment:   Intra-op Plan:   Post-operative Plan: Extubation in OR  Informed Consent: I have reviewed the patients History and Physical, chart, labs and discussed the procedure including the risks, benefits and alternatives for the proposed anesthesia with the patient or authorized representative who has indicated his/her understanding and acceptance.   Dental Advisory Given  Plan Discussed with: CRNA and Surgeon  Anesthesia Plan Comments:        Anesthesia Quick Evaluation

## 2011-08-08 NOTE — Transfer of Care (Signed)
Immediate Anesthesia Transfer of Care Note  Patient: Gabrielle Warren  Procedure(s) Performed:  BREAST LUMPECTOMY WITH NEEDLE LOCALIZATION  Patient Location: PACU  Anesthesia Type: General  Level of Consciousness: awake, alert  and oriented  Airway & Oxygen Therapy: Patient Spontanous Breathing and Patient connected to face mask oxygen  Post-op Assessment: Report given to PACU RN and Post -op Vital signs reviewed and stable  Post vital signs: Reviewed and stable  Complications: No apparent anesthesia complications

## 2011-08-09 NOTE — Anesthesia Postprocedure Evaluation (Signed)
  Anesthesia Post-op Note  Patient: Gabrielle Warren  Procedure(s) Performed:  BREAST LUMPECTOMY WITH NEEDLE LOCALIZATION  Patient Location: PACU  Anesthesia Type: General  Level of Consciousness: awake  Airway and Oxygen Therapy: Patient Spontanous Breathing  Post-op Pain: mild  Post-op Assessment: Post-op Vital signs reviewed, Patient's Cardiovascular Status Stable, Respiratory Function Stable, Patent Airway, No signs of Nausea or vomiting and Pain level controlled  Post-op Vital Signs: stable  Complications: No apparent anesthesia complications

## 2011-08-15 ENCOUNTER — Ambulatory Visit (HOSPITAL_BASED_OUTPATIENT_CLINIC_OR_DEPARTMENT_OTHER): Payer: Medicare Other | Admitting: Oncology

## 2011-08-15 VITALS — BP 165/77 | HR 73 | Temp 97.7°F | Ht 59.0 in | Wt 107.2 lb

## 2011-08-15 DIAGNOSIS — C50119 Malignant neoplasm of central portion of unspecified female breast: Secondary | ICD-10-CM

## 2011-08-15 NOTE — Progress Notes (Signed)
Gabrielle Warren  DOB: 03-Jul-1926  MR#: [id number]  CSN#: [id number]    History of present illness:   Gabrielle Warren is an 78 year old [place] woman who on screening mammography at [facility] was found to have a suspicious mass. I do not have those records, but additional views confirmed the abnormality andb iopsy was attempted 06/19/2011, complicated by bleeding. The patient was then referred to the breast Center whereultrasound-guided biopsy was performed 07/05/2011. This showed an invasive ductal carcinoma, grade 1, HER-2 negative, 100% estrogen receptor positive, 86% progesterone receptor positive, with an MIB-1 of 8%.the patient was then referred for bilateral breast MRIs which showed a 9 mm spiculated enhancing mass in the middle third of the right breast. There was an associated clip artifact. In the contralateral breast, a 5 mm mass was found which was irregular and nodular, with washout genetics. Biopsy of the second mass under MRI guidance (it could not be visualized under ultrasound) is scheduled for January 10.  The patient was seen at the the multi-disciplinary breast cancer clinic for definitive treatment plan.  Interval history: Gabrielle Warren returns today with her daughter for followup of her breast cancer. Since the last visit here she had a biopsy of the contralateral breast mass which proved to be fat necrosis. She then had definitive right lumpectomy on January 31, with the results as noted below.  Review of systems: She did very well with the surgery, with no problems regarding fever, bleeding, or dehiscence. The pain was minimal. However she is very bruised. The bruising actually crosses the midline into the left breast. She feels a little forgetful perhaps a little woozy headed at times, she thinks possibly from the anesthesia. Otherwise she is driving, doing all her normal activities of daily living, and has already resumed her walking program.  Past medical history:        Past Medical History  Diagnosis Date  . HYPERTENSION 07/25/2006  . ANXIETY 09/23/2007  . HYPERLIPIDEMIA 07/03/2009  . Atrial tachycardia   . MVP (mitral valve prolapse)   . Arthritis   . Osteoarthritis   coronary artery disease, status post remote heart attack (7846)  Past surgical history:      Past Surgical History  Procedure Date  . Catart extraction 04/2000, 03/2002  . Carpal tunnel release 09/2004    Family history:     Family History  Problem Relation Age of Onset  . Heart failure Mother   . Stroke Father   . Hypertension Other   the patient's father died suddenly at the age of 41, of unknown causes. The patient's mother died at the age of 25 from complications following a leg amputation. The patient had 2 brothers and 2 sisters. There is no history of breast or ovarian cancer in the immediate family  Gynecologic history: the patient had menarche age 49, last menstrual period 1. She took Prempro for many years, stopping only in December of 2012 with this diagnosis. The patient is GX P3, first pregnancy to term age 64.    Social history:  The patient's main name was [place]. Her family originally came from [place]. 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 Her 3 children are present today. Daughter Gabrielle Warren age 56 works in [place] as a Comptroller. Son Gabrielle Warren 53 lives in [place] and is a retired Comptroller [employer] researcher, broadcasting/film/video. Son Gabrielle Warren( "Annette Stable") 54, lives in [place] and works in Film/video editor. The patient has no grandchildren. She is alone at home except for  a CAT, but tells me that her daughter Gabrielle Warren lives nearby.     ADVANCED DIRECTIVES: yes  Health maintenance:       History  Substance Use Topics  . Smoking status: Former Smoker    Quit date: 07/08/1994  . Smokeless tobacco: Not on file  . Alcohol Use: Yes      Colonoscopy: never  PAP: 2009  Bone density: 2012  Cholesterol:  Allergies:     Allergies  Allergen  Reactions  . Fosamax (Alendronate Sodium) Other (See Comments)    Canker sores and bleeding gums    Medications:      Current Outpatient Prescriptions  Medication Sig Dispense Refill  . aspirin 81 MG tablet Take 81 mg by mouth daily.        . diazepam (VALIUM) 10 MG tablet 1/2 tablet twice a day  90 tablet  0  . diltiazem (CARDIZEM CD) 120 MG 24 hr capsule Take 1 capsule (120 mg total) by mouth daily.  90 capsule  3  . Ferrous Sulfate (IRON) 90 (18 FE) MG TABS Take 2 tablets by mouth daily.        . fish oil-omega-3 fatty acids 1000 MG capsule Take 2 g by mouth daily.        Marland Kitchen losartan (COZAAR) 100 MG tablet Take 1 tablet (100 mg total) by mouth daily.  90 tablet  3  . metoprolol succinate (TOPROL-XL) 25 MG 24 hr tablet Take 1 tablet (25 mg total) by mouth 2 (two) times daily.  180 tablet  3  . mometasone (NASONEX) 50 MCG/ACT nasal spray Place 2 sprays into the nose daily as needed.       . Multiple Vitamin (MULTIVITAMIN) tablet Take 1 tablet by mouth daily.        Marland Kitchen OVER THE COUNTER MEDICATION Calcium-vitamin D  1200-Vitamin D 1000 mg   1 Tab daily      . OVER THE COUNTER MEDICATION Glucosamine -chrondroitin  1,500mg  (BOTH)  Osteo Bi Flex       . prazosin (MINIPRESS) 1 MG capsule 1 po qhs  90 capsule  3  . simvastatin (ZOCOR) 40 MG tablet 1 po qhs  90 tablet  3  . traMADol (ULTRAM) 50 MG tablet Take 1 tablet (50 mg total) by mouth every 6 (six) hours as needed for pain.  20 tablet  0  . zoster vaccine live, PF, (ZOSTAVAX) 16109 UNT/0.65ML injection Inject 19,400 Units into the skin once.  1 vial  0  . TDaP (BOOSTRIX) 5-2.5-18.5 LF-MCG/0.5 injection Inject 0.5 mLs into the muscle once.  0.5 mL  0    Physical exam:  Elderly white woman who appears comfortable    Filed Vitals:   08/15/11 1558  BP: 165/77  Pulse: 73  Temp: 97.7 F (36.5 C)     Body mass index is 21.65 kg/(m^2).  ECOG PS: 0  Sclerae unicteric Oropharynx clear No peripheral adenopathy Lungs no rales or  rhonchi Heart regular rate and rhythm Abd benign MSK no focal spinal tenderness, no peripheral edema Neuro: nonfocal Breasts: Status post right lumpectomy. The right breast wound is still bandaged over. There is significant ecchymosis throughout the right breast a little bit low that and across the midline into the left breast as well. There is a hint of some of fluid in the right breast but no clearly demarcated seroma. The left breast is otherwise unremarkable  Lab results:      CBC No results found for this basename: WBC:5,HGB:5,HCT:5,PLT:5,MCV:5,MCH:5,MCHC:5,RDW:5,NEUTRABS:5,LYMPHSABS:5,MONOABS:5,EOSABS:5,BASOSABS:5,BANDABS:5,BANDSABD:5  in the last 168 hours  Chemistries  No results found for this basename: NA:5,K:5,CL:5,CO2:5,GLUCOSE:5,BUN:5,CREATININE:5,CALCIUM:5,MG:5 in the last 168 hours  Studies:    Patient Name: RUTHELLA, KIRCHMAN Accession #: [id number]  Collected Date: 08/08/2011 Received Date: 08/08/2011 Physician: Abigail Miyamoto, MD Chart #: MRN # : [id number] Physician cc: Christiana Pellant, MD REPORT OF SURGICAL PATHOLOGY FINAL DIAGNOSIS Diagnosis 1. Breast, lumpectomy, right - INVASIVE LOW GRADE WELL DIFFERENTIATED DUCTAL CARCINOMA (1.1 CM) - NO LYMPHOVASCULAR INVASION IDENTIFIED. - INVASIVE TUMOR 0.1 CM FROM NEAREST MARGIN (ANTERIOR) - DUCTAL CARCINOMA IN SITU, LOW GRADE. - SEE TUMOR SYNOPTIC TEMPLATE BELOW. 2. Breast, excision, additional lateral superior margin - ATYPICAL LOBULAR HYPERPLASIA. - FIBROCYSTIC CHANGE AND MICROCALCIFICATIONS IN BENIGN DUCTS AND LOBULES. - NEGATIVE FOR MALIGNANCY. - SURGICAL MARGIN, NEGATIVE FOR MALIGNANCY.  Assessment: 78 year old [place] woman status post right lumpectomy 08/08/2011 for a T1c NX invasive ductal carcinoma, grade 1, strongly estrogen and progesterone receptor positive, HER-2 negative, with an MIB-1 of less than 10.  Plan: Her risk of dying from this tumor if she does nothing beyond surgery is in the 1-2% range.  However her risk of recurrence is somewhere between 14 and 20%. If she takes tamoxifen for 5 years that risk would essentially be cut in half. The risk of a new breast cancer developing would also be cut in half. Perhaps more importantly she would be able to safely avoid having radiation treatments if she took anti-estrogens.  We discussed the possible toxicities side effects and complications of anti-estrogens. We are staying away from the aromatase inhibitors at least initially because of concerns regarding osteoporosis. The biggest concern with tamoxifen of course as blood clots but she took Prempro for decades without any clotting issues.  Accordingly we are going to give tamoxifen a try. She will not start however untill March 1. Accordingly I will see her again the first week in June. If she is tolerating treatment well, we will start seeing her on an every 6 month basis for the first 2 years and then yearly until she completes the 5 years of therapy.  She knows to call for any problems that may develop before the next visit   Carlyle Achenbach C 08/15/2011

## 2011-08-16 ENCOUNTER — Encounter (INDEPENDENT_AMBULATORY_CARE_PROVIDER_SITE_OTHER): Payer: Self-pay | Admitting: General Surgery

## 2011-08-16 ENCOUNTER — Telehealth: Payer: Self-pay | Admitting: Radiation Oncology

## 2011-08-16 ENCOUNTER — Telehealth: Payer: Self-pay | Admitting: Oncology

## 2011-08-16 NOTE — Telephone Encounter (Signed)
S/w the pt  And she is aware of her may and June 2013 appts

## 2011-08-16 NOTE — Telephone Encounter (Signed)
Received call from patient wanting to cancel 08/28/2011 consultation with Dr. Basilio Cairo. Patient states,"I met with Dr. Darnelle Catalan yesterday and he says it is my decision to move forward with radiation or not." "I have decided to just take the five year pill and no radiation," patient states. Informed Dr. Karoline Caldwell of this conversation. Instructed patient that Dr. Karoline Caldwell may wish to contact her and patient approved. Also, instructed Otis Peak to remove patient from Dr. Karoline Caldwell schedule.

## 2011-08-19 ENCOUNTER — Other Ambulatory Visit: Payer: Self-pay | Admitting: *Deleted

## 2011-08-19 DIAGNOSIS — I10 Essential (primary) hypertension: Secondary | ICD-10-CM

## 2011-08-19 MED ORDER — LOSARTAN POTASSIUM 100 MG PO TABS: 100.0000 mg | ORAL_TABLET | Freq: Every day | ORAL | Status: DC

## 2011-08-19 MED ORDER — PRAZOSIN HCL 1 MG PO CAPS: ORAL_CAPSULE | ORAL | Status: DC

## 2011-08-19 NOTE — Telephone Encounter (Signed)
Rx mail to Pt at her request.

## 2011-08-20 ENCOUNTER — Ambulatory Visit (INDEPENDENT_AMBULATORY_CARE_PROVIDER_SITE_OTHER): Payer: Medicare Other | Admitting: Surgery

## 2011-08-20 ENCOUNTER — Encounter (INDEPENDENT_AMBULATORY_CARE_PROVIDER_SITE_OTHER): Payer: Self-pay | Admitting: Surgery

## 2011-08-20 ENCOUNTER — Telehealth (INDEPENDENT_AMBULATORY_CARE_PROVIDER_SITE_OTHER): Payer: Self-pay | Admitting: Surgery

## 2011-08-20 VITALS — BP 150/68 | HR 66 | Temp 98.1°F | Resp 18 | Ht 59.0 in | Wt 107.0 lb

## 2011-08-20 DIAGNOSIS — Z09 Encounter for follow-up examination after completed treatment for conditions other than malignant neoplasm: Secondary | ICD-10-CM

## 2011-08-20 NOTE — Progress Notes (Signed)
Subjective:     Patient ID: Gabrielle Warren, female   DOB: 1925/08/17, 78 y.o.   MRN: [id number]  HPI She is here for a postoperative visit status post needle localized right breast lumpectomy. She is doing well. She reports minimal discomfort.  Review of Systems     Objective:   Physical Exam On exam, she has a moderate amount of ecchymosis of the breast. The incision is healing very well. The final pathology showed that the margins were negative.    Assessment:     Patient status post excision of right breast cancer    Plan:     I do believe that the second area of tissue I took out was the anterior area where the margin was close. She is doing well. She has already seen her oncologist and will be starting tamoxifen. I will see her back in 2 weeks because of the ecchymosis

## 2011-08-23 ENCOUNTER — Other Ambulatory Visit: Payer: Self-pay | Admitting: *Deleted

## 2011-08-23 MED ORDER — TAMOXIFEN CITRATE 20 MG PO TABS: 20.0000 mg | ORAL_TABLET | Freq: Every day | ORAL | Status: AC

## 2011-08-28 ENCOUNTER — Ambulatory Visit: Payer: Medicare Other

## 2011-08-28 ENCOUNTER — Ambulatory Visit: Payer: Medicare Other | Admitting: Radiation Oncology

## 2011-09-02 ENCOUNTER — Ambulatory Visit (INDEPENDENT_AMBULATORY_CARE_PROVIDER_SITE_OTHER): Payer: Medicare Other | Admitting: Surgery

## 2011-09-02 ENCOUNTER — Encounter (INDEPENDENT_AMBULATORY_CARE_PROVIDER_SITE_OTHER): Payer: Self-pay | Admitting: Surgery

## 2011-09-02 VITALS — BP 155/81 | HR 72 | Temp 98.0°F | Resp 16 | Ht 59.0 in | Wt 107.0 lb

## 2011-09-02 DIAGNOSIS — Z09 Encounter for follow-up examination after completed treatment for conditions other than malignant neoplasm: Secondary | ICD-10-CM

## 2011-09-02 NOTE — Progress Notes (Signed)
Subjective:     Patient ID: Gabrielle Warren, female   DOB: 1925-12-05, 78 y.o.   MRN: [id number]  HPI She is here for another postoperative visit. She is only having mild discomfort of the breast.  Review of Systems     Objective:   Physical Exam    On exam, there is still a hematoma of the breast. There is no evidence of infection. The incision itself is healing well Assessment:     Patient status post excision of right breast cancer.    Plan:     I will see her back in one month. I am going to try to give the hematoma time to liquefy and hopefully aspirate it.

## 2011-09-20 ENCOUNTER — Telehealth: Payer: Self-pay | Admitting: *Deleted

## 2011-09-20 ENCOUNTER — Telehealth: Payer: Self-pay | Admitting: Oncology

## 2011-09-20 ENCOUNTER — Ambulatory Visit: Payer: Medicare Other | Admitting: Lab

## 2011-09-20 NOTE — Telephone Encounter (Signed)
placed patient on the walk in list for labs

## 2011-09-20 NOTE — Telephone Encounter (Signed)
Mailed the pt her may,june 2013 appt calendar

## 2011-10-03 ENCOUNTER — Other Ambulatory Visit: Payer: Self-pay | Admitting: *Deleted

## 2011-10-03 ENCOUNTER — Other Ambulatory Visit: Payer: Self-pay | Admitting: Family Medicine

## 2011-10-03 DIAGNOSIS — E785 Hyperlipidemia, unspecified: Secondary | ICD-10-CM

## 2011-10-03 MED ORDER — SIMVASTATIN 40 MG PO TABS: ORAL_TABLET | ORAL | Status: DC

## 2011-10-03 NOTE — Telephone Encounter (Signed)
Last seen 06/20/11 and filled 06/28/11 #90. Please advise    KP

## 2011-10-03 NOTE — Telephone Encounter (Signed)
Rx sent 

## 2011-10-08 ENCOUNTER — Ambulatory Visit (INDEPENDENT_AMBULATORY_CARE_PROVIDER_SITE_OTHER): Payer: Medicare Other | Admitting: Surgery

## 2011-10-08 ENCOUNTER — Encounter (INDEPENDENT_AMBULATORY_CARE_PROVIDER_SITE_OTHER): Payer: Self-pay | Admitting: Surgery

## 2011-10-08 VITALS — BP 144/68 | HR 72 | Temp 97.2°F | Resp 18 | Ht 59.0 in | Wt 105.4 lb

## 2011-10-08 DIAGNOSIS — Z09 Encounter for follow-up examination after completed treatment for conditions other than malignant neoplasm: Secondary | ICD-10-CM

## 2011-10-08 NOTE — Progress Notes (Signed)
Subjective:     Patient ID: Gabrielle Warren, female   DOB: 08/13/1925, 78 y.o.   MRN: [id number]  HPI She is here for a followup of the right breast hematoma. She reports she is doing well and has no discomfort  Review of Systems     Objective:   Physical Exam    The ecchymosis has resolved. I can feel hematoma still which is much smaller. I inserted an 18-gauge needle and drained 20 cc of seroma fluid Assessment:     Patient with right breast hematoma postop    Plan:     I will see her back in 4 weeks

## 2011-10-21 ENCOUNTER — Ambulatory Visit: Payer: Medicare Other | Admitting: Cardiology

## 2011-10-29 ENCOUNTER — Encounter: Payer: Self-pay | Admitting: Cardiology

## 2011-10-29 ENCOUNTER — Ambulatory Visit (INDEPENDENT_AMBULATORY_CARE_PROVIDER_SITE_OTHER): Payer: Medicare Other | Admitting: Cardiology

## 2011-10-29 VITALS — BP 152/65 | HR 68 | Ht 59.0 in | Wt 105.8 lb

## 2011-10-29 DIAGNOSIS — R002 Palpitations: Secondary | ICD-10-CM

## 2011-10-29 DIAGNOSIS — I10 Essential (primary) hypertension: Secondary | ICD-10-CM

## 2011-10-29 NOTE — Progress Notes (Signed)
HPI The patient presents for routine followup. Since I saw her she has been doing well.  She was diagnosed with breast cancer and had lumpectomy. She's been treated with tamoxifen. She had been complaining of palpitations. She had some bradycardia on Holter monitor and her beta blocker and calcium channel blocker were used. She wore an event monitor which demonstrated no sustained arrhythmias. Her heart rate has been in the 50s consistently but she does not describe presyncope or syncope. She has had no chest pressure, neck or arm discomfort. She has had no new shortness of breath, PND or orthopnea.   Allergies  Allergen Reactions  . Fosamax (Alendronate Sodium) Other (See Comments)    Canker sores and bleeding gums    Current Outpatient Prescriptions  Medication Sig Dispense Refill  . aspirin 81 MG tablet Take 81 mg by mouth daily.        . diazepam (VALIUM) 10 MG tablet take 1/2 tablet by mouth twice a day  90 tablet  0  . diltiazem (CARDIZEM CD) 120 MG 24 hr capsule Take 1 capsule (120 mg total) by mouth daily.  90 capsule  3  . Ferrous Sulfate (IRON) 90 (18 FE) MG TABS Take 2 tablets by mouth daily.        . fish oil-omega-3 fatty acids 1000 MG capsule Take 2 g by mouth daily.        Marland Kitchen losartan (COZAAR) 100 MG tablet Take 1 tablet (100 mg total) by mouth daily.  90 tablet  1  . metoprolol succinate (TOPROL-XL) 25 MG 24 hr tablet Take 1 tablet (25 mg total) by mouth 2 (two) times daily.  180 tablet  3  . mometasone (NASONEX) 50 MCG/ACT nasal spray Place 2 sprays into the nose daily as needed.       . Multiple Vitamin (MULTIVITAMIN) tablet Take 1 tablet by mouth daily.        Marland Kitchen OVER THE COUNTER MEDICATION Calcium-vitamin D  1800-Vitamin D 1400 mg   1 Tab daily      . OVER THE COUNTER MEDICATION Glucosamine -chrondroitin  1,500mg  (BOTH)  Osteo Bi Flex       . prazosin (MINIPRESS) 1 MG capsule take 2 in am , 1 in evening, 1 at bedtime  360 capsule  1  . simvastatin (ZOCOR) 40 MG tablet 1  po qhs  90 tablet  0  . tamoxifen (NOLVADEX) 20 MG tablet Daily.      . traMADol (ULTRAM) 50 MG tablet Take 1 tablet (50 mg total) by mouth every 6 (six) hours as needed for pain.  20 tablet  0    Past Medical History  Diagnosis Date  . HYPERTENSION 07/25/2006  . ANXIETY 09/23/2007  . HYPERLIPIDEMIA 07/03/2009  . Atrial tachycardia   . MVP (mitral valve prolapse)   . Arthritis   . Osteoarthritis     Past Surgical History  Procedure Date  . Catart extraction 04/2000, 03/2002  . Carpal tunnel release 09/2004  . Breast surgery 08/08/11    lumpectomy   ROS  As stated in the HPI and negative for all other systems.  PHYSICAL EXAM BP 152/65  Pulse 68  Ht 4\' 11"  (1.499 m)  Wt 105 lb 12.8 oz (47.991 kg)  BMI 21.37 kg/m2 GENERAL:  Well appearing NECK:  No jugular venous distention, waveform within normal limits, carotid upstroke brisk and symmetric, no bruits, no thyromegaly LYMPHATICS:  No cervical, inguinal adenopathy LUNGS:  Clear to auscultation bilaterally CHEST:  Unremarkable  HEART:  PMI not displaced or sustained,S1 and S2 within normal limits, no S3, no S4, no clicks, no rubs, no murmurs ABD:  Flat, positive bowel sounds normal in frequency in pitch, no bruits, no rebound, no guarding, no midline pulsatile mass, no hepatomegaly, no splenomegaly EXT:  2 plus pulses throughout, no edema, no cyanosis no clubbing  ASSESSMENT AND PLAN

## 2011-10-29 NOTE — Assessment & Plan Note (Signed)
I reviewed her blood pressure diary and her blood pressures are better controlled than they have been in a long time. She will continue the medications as listed. She is tolerating the bradycardia.

## 2011-10-29 NOTE — Patient Instructions (Signed)
The current medical regimen is effective;  continue present plan and medications.  Follow up in 6 months with Dr Hochrein.  You will receive a letter in the mail 2 months before you are due.  Please call us when you receive this letter to schedule your follow up appointment.  

## 2011-10-29 NOTE — Assessment & Plan Note (Signed)
At this point there are no sustained symptomatic tachycardia arrhythmias. No change in therapy is indicated.

## 2011-10-31 ENCOUNTER — Encounter: Payer: Self-pay | Admitting: *Deleted

## 2011-11-01 ENCOUNTER — Telehealth: Payer: Self-pay | Admitting: Oncology

## 2011-11-01 NOTE — Telephone Encounter (Signed)
called pt informed her that we moved her lab appt o  05/28 to 06/04 right before md appt

## 2011-11-07 ENCOUNTER — Ambulatory Visit (INDEPENDENT_AMBULATORY_CARE_PROVIDER_SITE_OTHER): Payer: Medicare Other | Admitting: Surgery

## 2011-11-07 ENCOUNTER — Encounter (INDEPENDENT_AMBULATORY_CARE_PROVIDER_SITE_OTHER): Payer: Self-pay | Admitting: Surgery

## 2011-11-07 VITALS — BP 147/71 | HR 61 | Temp 98.2°F | Ht 59.0 in | Wt 105.2 lb

## 2011-11-07 DIAGNOSIS — N6489 Other specified disorders of breast: Secondary | ICD-10-CM

## 2011-11-07 NOTE — Progress Notes (Signed)
Subjective:     Patient ID: Gabrielle Warren, female   DOB: 03-19-1926, 78 y.o.   MRN: [id number]  HPI She has no complaints today. She reports minimal fullness of the right breast.  Review of Systems     Objective:   Physical Exam On exam, her incision is well healed. I do not feel any seroma or hematoma    Assessment:     Patient with postop hematoma status post excision of right breast cancer    Plan:     I will see her for a long-term follow up in 6 months

## 2011-11-24 IMAGING — CR DG ABDOMEN 2V
2 series · 2 of 2 positions shown · non-contrast
Comparison: Lumbar MRI [DATE].

CLINICAL DATA: 83-year-old female with constipation.

ABDOMEN - 2 VIEW

[view not recorded (1 of 2)]
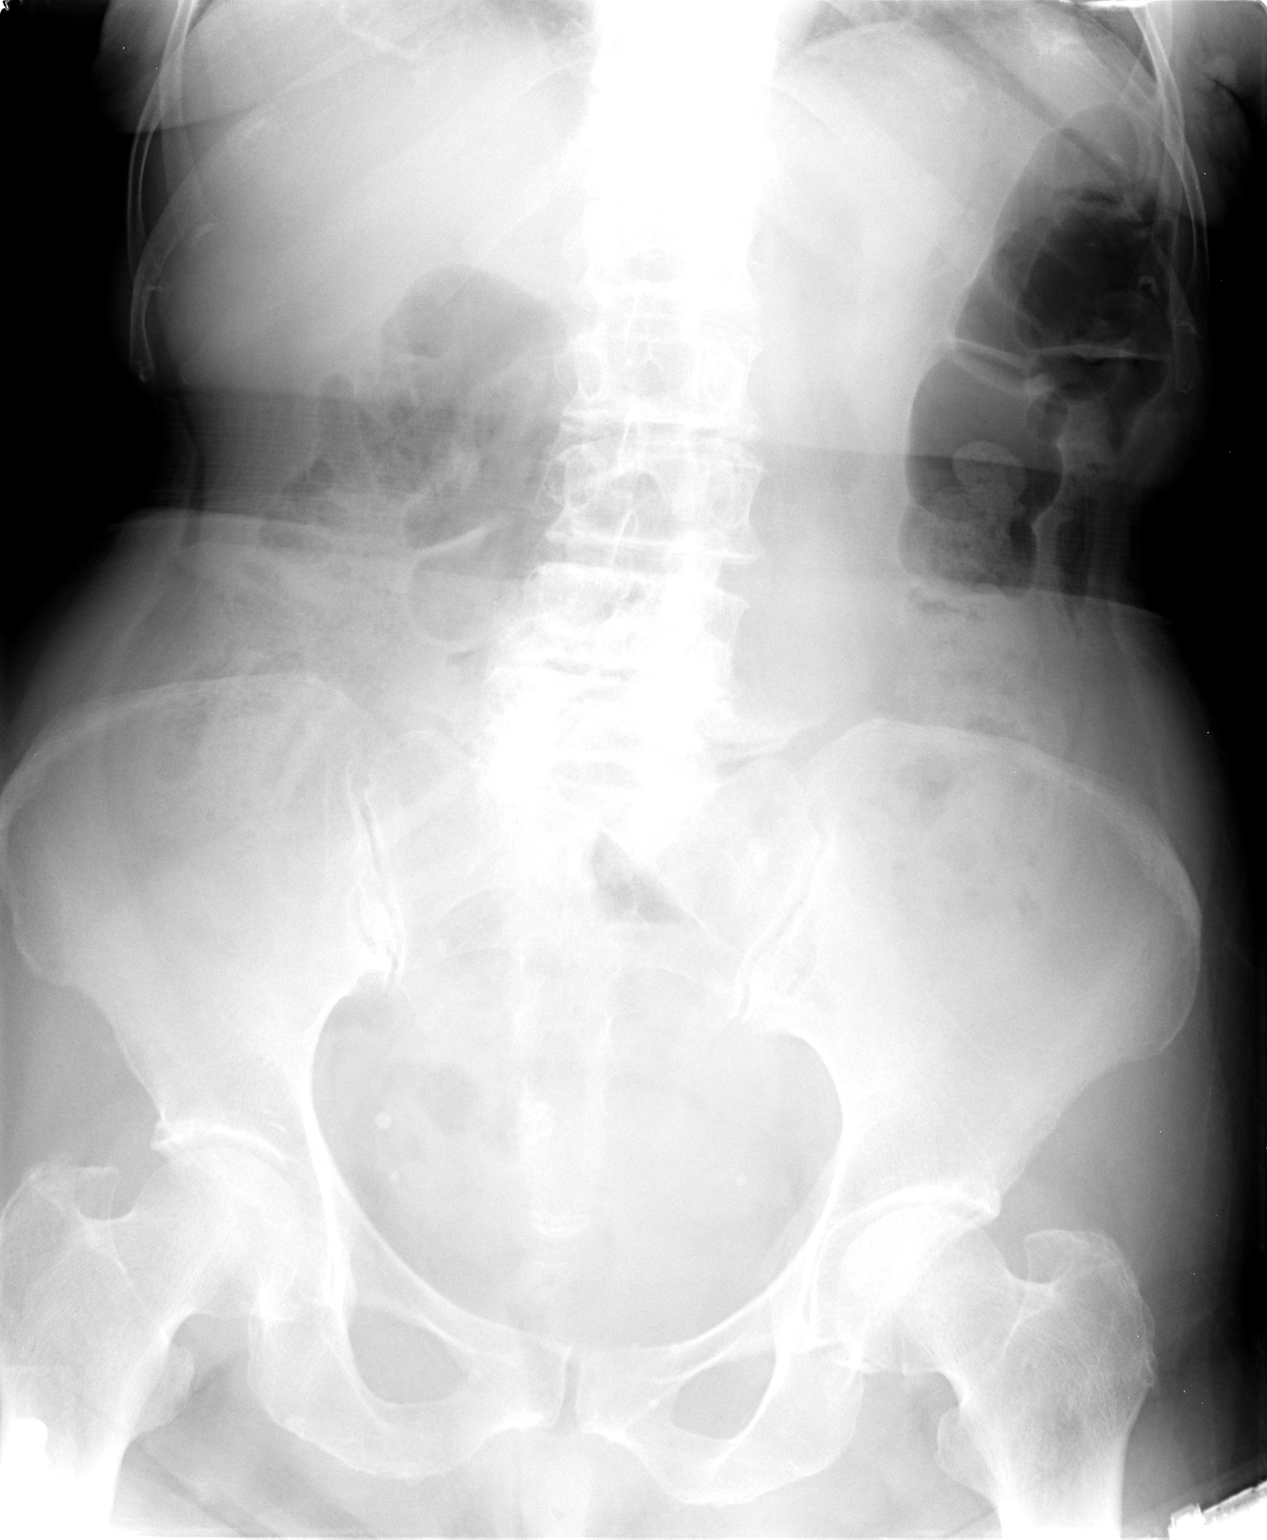

[view not recorded (2 of 2)]
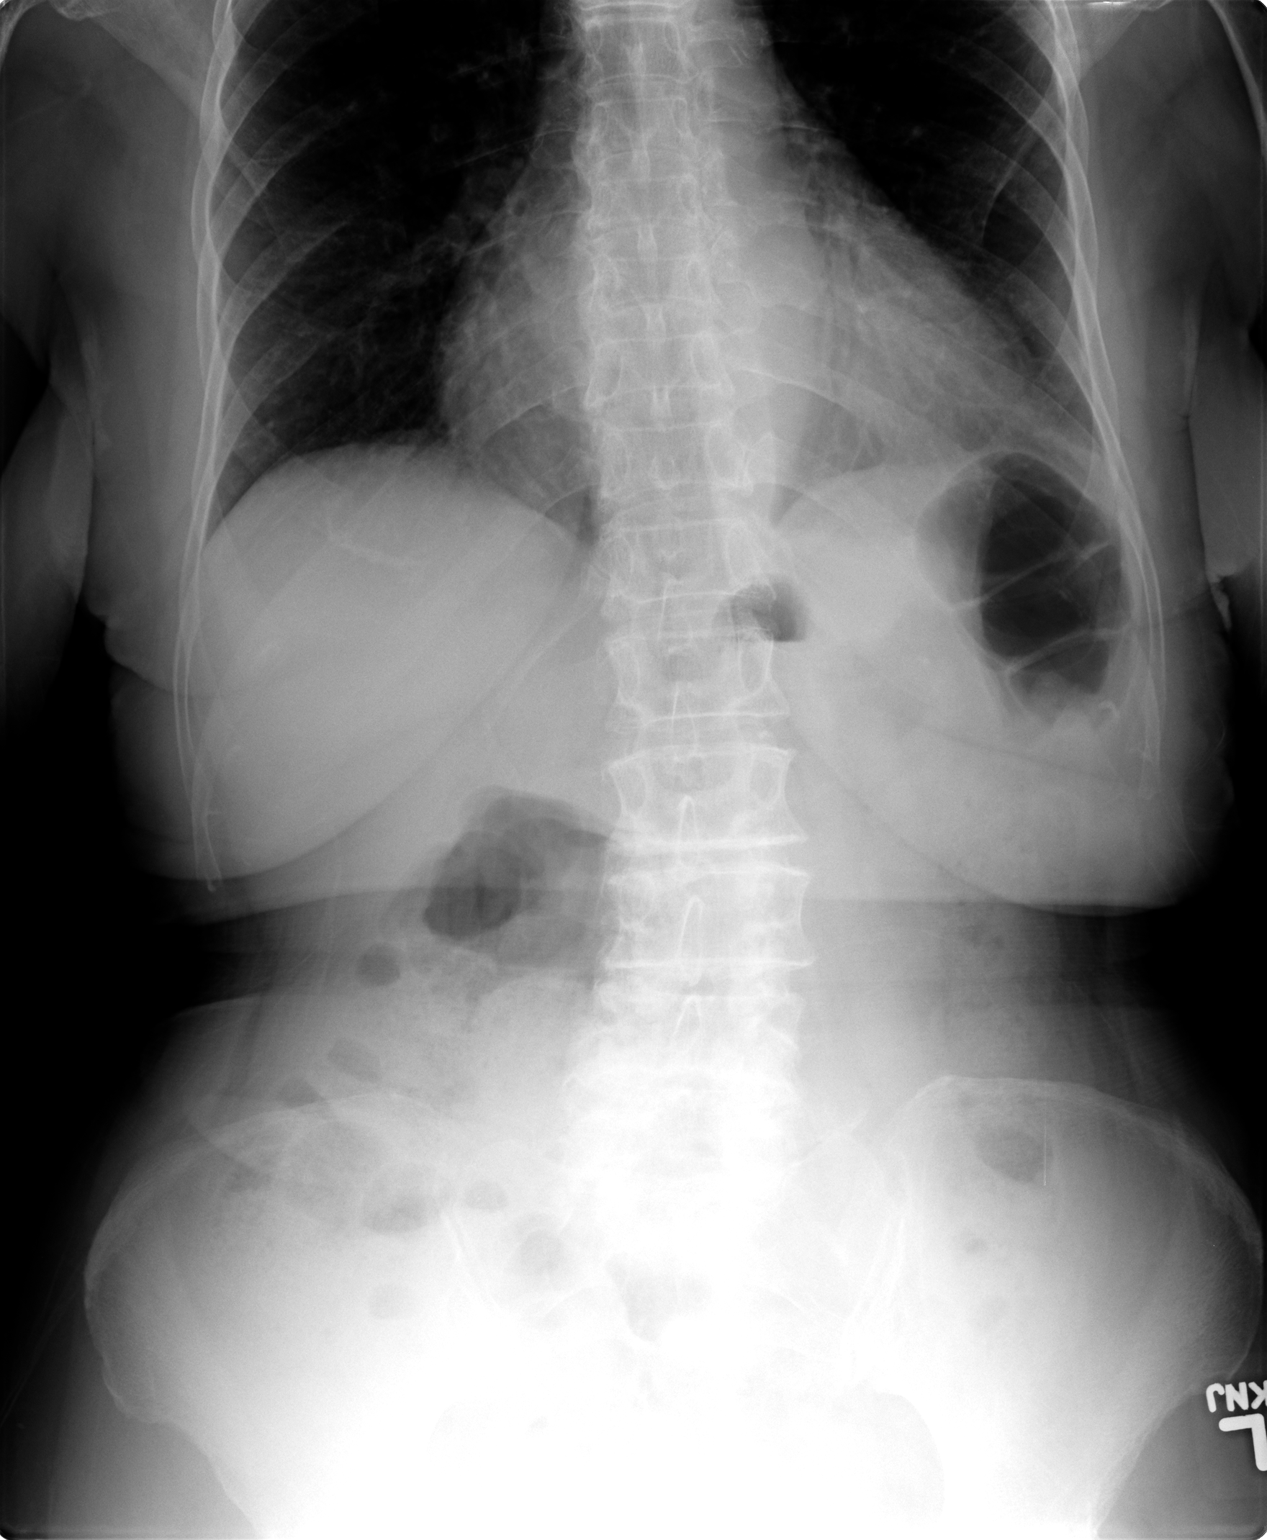

[2 of 2 positions shown; findings below may reference images not displayed]

FINDINGS: Cardiomegaly.  Lung bases appear clear.  No
pneumoperitoneum. Nonobstructed bowel gas pattern. Only mild volume
of retained stool is identified in the colon.  Visceral contours
are within normal limits.  Chronic lumbar disc space narrowing at
L4-L5, to a lesser extent at L2-L3. No acute osseous abnormality
identified.
IMPRESSION: 1. Nonobstructed bowel gas pattern, no free air.
2.  Cardiomegaly.

## 2011-11-25 ENCOUNTER — Encounter (INDEPENDENT_AMBULATORY_CARE_PROVIDER_SITE_OTHER): Payer: Medicare Other | Admitting: Surgery

## 2011-12-03 ENCOUNTER — Other Ambulatory Visit: Payer: Medicare Other | Admitting: Lab

## 2011-12-06 ENCOUNTER — Telehealth: Payer: Self-pay | Admitting: Oncology

## 2011-12-06 ENCOUNTER — Other Ambulatory Visit: Payer: Self-pay | Admitting: Physician Assistant

## 2011-12-06 NOTE — Telephone Encounter (Signed)
S/w the pt and she is aware of the r/s appt time and with the np instead of the md on 12/10/2011

## 2011-12-10 ENCOUNTER — Ambulatory Visit (HOSPITAL_BASED_OUTPATIENT_CLINIC_OR_DEPARTMENT_OTHER): Payer: Medicare Other | Admitting: Physician Assistant

## 2011-12-10 ENCOUNTER — Encounter: Payer: Self-pay | Admitting: Physician Assistant

## 2011-12-10 ENCOUNTER — Other Ambulatory Visit (HOSPITAL_BASED_OUTPATIENT_CLINIC_OR_DEPARTMENT_OTHER): Payer: Medicare Other | Admitting: Lab

## 2011-12-10 ENCOUNTER — Telehealth: Payer: Self-pay | Admitting: *Deleted

## 2011-12-10 VITALS — BP 131/59 | HR 63 | Temp 97.8°F | Ht 59.0 in | Wt 105.3 lb

## 2011-12-10 DIAGNOSIS — Z171 Estrogen receptor negative status [ER-]: Secondary | ICD-10-CM

## 2011-12-10 DIAGNOSIS — D649 Anemia, unspecified: Secondary | ICD-10-CM

## 2011-12-10 DIAGNOSIS — C50119 Malignant neoplasm of central portion of unspecified female breast: Secondary | ICD-10-CM

## 2011-12-10 DIAGNOSIS — C50919 Malignant neoplasm of unspecified site of unspecified female breast: Secondary | ICD-10-CM

## 2011-12-10 DIAGNOSIS — Z79899 Other long term (current) drug therapy: Secondary | ICD-10-CM

## 2011-12-10 DIAGNOSIS — Z853 Personal history of malignant neoplasm of breast: Secondary | ICD-10-CM

## 2011-12-10 HISTORY — DX: Anemia, unspecified: D64.9

## 2011-12-10 LAB — CBC WITH DIFFERENTIAL/PLATELET
BASO%: 1 % (ref 0.0–2.0)
EOS%: 2 % (ref 0.0–7.0)
HCT: 32.8 % — ABNORMAL LOW (ref 34.8–46.6)
LYMPH%: 25.8 % (ref 14.0–49.7)
MCH: 31.8 pg (ref 25.1–34.0)
MCHC: 33.7 g/dL (ref 31.5–36.0)
MONO#: 0.7 10*3/uL (ref 0.1–0.9)
NEUT%: 57.7 % (ref 38.4–76.8)
Platelets: 162 10*3/uL (ref 145–400)
RBC: 3.47 10*6/uL — ABNORMAL LOW (ref 3.70–5.45)
WBC: 5.3 10*3/uL (ref 3.9–10.3)
nRBC: 0 % (ref 0–0)

## 2011-12-10 NOTE — Progress Notes (Signed)
ID: Gabrielle Warren   DOB: 03-03-1926  MR#: [id number]  JXB#:[id number]  HISTORY OF PRESENT ILLNESS: Gabrielle Warren is an 78 year old [place] woman who on screening mammography at [facility] was found to have a suspicious mass. I do not have those records, but additional views confirmed the abnormality andb iopsy was attempted 06/19/2011, complicated by bleeding. The patient was then referred to the breast Center whereultrasound-guided biopsy was performed 07/05/2011. This showed an invasive ductal carcinoma, grade 1, HER-2 negative, 100% estrogen receptor positive, 86% progesterone receptor positive, with an MIB-1 of 8%.the patient was then referred for bilateral breast MRIs which showed a 9 mm spiculated enhancing mass in the middle third of the right breast. There was an associated clip artifact. In the contralateral breast, a 5 mm mass was found which was irregular and nodular, with washout genetics. Biopsy of the second mass under MRI guidance (it could not be visualized under ultrasound) was benign.  Patient underwent definitive right lumpectomy on 08/08/2011 for a T1c NX invasive ductal carcinoma. She began on tamoxifen, 20 mg daily, in February 2013. She did not receive radiation therapy.  INTERVAL HISTORY: Gabrielle Warren returns today for routine followup of her right breast carcinoma. She began tamoxifen in January 2013, and is tolerating the medication well. Interval history is remarkable only for recent bacterial vaginitis treated by Dr. Jarold Warren which was likely unassociated with the tamoxifen. Gabrielle Warren denies any current vaginal discharge, and has had no signs of vaginal bleeding.   REVIEW OF SYSTEMS: Otherwise, Gabrielle Warren has had no recent illnesses and denies fevers, chills, or night sweats. No rashes or skin changes. No signs of abnormal bleeding. She's had no evidence of abnormal blood clots. No nausea or change in bowel habits. No chest pain or shortness of breath. No  abnormal headaches, dizziness, or change in vision. No peripheral swelling.  A detailed review of systems is otherwise noncontributory. PAST MEDICAL HISTORY: Past Medical History  Diagnosis Date  . HYPERTENSION 07/25/2006  . ANXIETY 09/23/2007  . HYPERLIPIDEMIA 07/03/2009  . Atrial tachycardia   . MVP (mitral valve prolapse)   . Arthritis   . Osteoarthritis   . Breast cancer   . Anemia 12/10/2011    PAST SURGICAL HISTORY: Past Surgical History  Procedure Date  . Catart extraction 04/2000, 03/2002  . Carpal tunnel release 09/2004  . Breast surgery 08/08/11    lumpectomy    FAMILY HISTORY Family History  Problem Relation Age of Onset  . Heart failure Mother   . Stroke Father   . Hypertension Other   The patient's father died suddenly at the age of 58, of unknown causes. The patient's mother died at the age of 19 from complications following a leg amputation. The patient had 2 brothers and 2 sisters. There is no history of breast or ovarian cancer in the immediate family   GYNECOLOGIC HISTORY: The patient had menarche age 5, last menstrual period 58. She took Prempro for many years, stopping only in December of 2012 with this diagnosis. The patient is GX P3, first pregnancy to term age 65.  SOCIAL HISTORY: The patient's maiden name was Gabrielle Warren. Her family originally came from [place]. 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 She has 3 children.  Daughter Gabrielle Warren age 79 works in [place] as a Comptroller. Son Gabrielle Warren 53 lives in [place] and is a retired Therapist, occupational. Son Gabrielle Noa( "Annette Stable") 54, lives in [place] and works in Film/video editor. The patient has no grandchildren. She is  alone at home except for a cat, but tells me that her daughter Gabrielle Warren lives nearby.     ADVANCED DIRECTIVES: In place  HEALTH MAINTENANCE: History  Substance Use Topics  . Smoking status: Former Smoker    Quit date: 07/08/1994  . Smokeless tobacco: Never  Used  . Alcohol Use: Yes     Colonoscopy: Never  PAP:  2009  Bone density:  2012 at [facility], "Borderline                  Osteoporosis"  Lipid panel:  Followed by Dr. Laury Axon  Allergies  Allergen Reactions  . Fosamax (Alendronate Sodium) Other (See Comments)    Canker sores and bleeding gums    Current Outpatient Prescriptions  Medication Sig Dispense Refill  . aspirin 81 MG tablet Take 81 mg by mouth daily.        . diazepam (VALIUM) 10 MG tablet take 1/2 tablet by mouth twice a day  90 tablet  0  . diltiazem (CARDIZEM CD) 120 MG 24 hr capsule Take 1 capsule (120 mg total) by mouth daily.  90 capsule  3  . Ferrous Sulfate (IRON) 90 (18 FE) MG TABS Take 2 tablets by mouth daily.        . fish oil-omega-3 fatty acids 1000 MG capsule Take 2 g by mouth daily.        Marland Kitchen losartan (COZAAR) 100 MG tablet Take 1 tablet (100 mg total) by mouth daily.  90 tablet  1  . metoprolol succinate (TOPROL-XL) 25 MG 24 hr tablet Take 1 tablet (25 mg total) by mouth 2 (two) times daily.  180 tablet  3  . mometasone (NASONEX) 50 MCG/ACT nasal spray Place 2 sprays into the nose daily as needed.       . Multiple Vitamin (MULTIVITAMIN) tablet Take 1 tablet by mouth daily.        Marland Kitchen OVER THE COUNTER MEDICATION Calcium-vitamin D  1800-Vitamin D 1400 mg   1 Tab daily      . OVER THE COUNTER MEDICATION Glucosamine -chrondroitin  1,500mg  (BOTH)  Osteo Bi Flex       . prazosin (MINIPRESS) 1 MG capsule take 2 in am , 1 in evening, 1 at bedtime  360 capsule  1  . simvastatin (ZOCOR) 40 MG tablet 1 po qhs  90 tablet  0  . tamoxifen (NOLVADEX) 20 MG tablet Daily.      . traMADol (ULTRAM) 50 MG tablet Take 1 tablet (50 mg total) by mouth every 6 (six) hours as needed for pain.  20 tablet  0    OBJECTIVE: Filed Vitals:   12/10/11 1046  BP: 131/59  Pulse: 63  Temp: 97.8 F (36.6 C)     Body mass index is 21.27 kg/(m^2).    ECOG FS: 1  Filed Weights   12/10/11 1046  Weight: 105 lb 4.8 oz (47.764 kg)   Elderly  white female who appears comfortable and in no acute distress.  Physical Exam: HEENT:  Sclerae anicteric, conjunctivae pink.  Oropharynx clear.     Nodes:  No cervical, supraclavicular, or axillary lymphadenopathy palpated.  Breast Exam:  Right breast is status post lumpectomy with well-healed incision. No suspicious nodularity or skin changes. No evidence of local recurrence. Left breast is benign.  Lungs:  Clear to auscultation bilaterally.  No crackles, rhonchi, or wheezes.   Heart:  Regular rate and rhythm.   Abdomen:  Soft, thin, nontender.  Positive bowel sounds.  No organomegaly  or masses palpated.   Musculoskeletal:  No focal spinal tenderness to palpation.  Extremities:  Benign.  No peripheral edema or cyanosis.   Skin:  Benign.   Neuro:  Nonfocal. Alert and oriented x3.    LAB RESULTS: Lab Results  Component Value Date   WBC 5.3 12/10/2011   NEUTROABS 3.1 12/10/2011   HGB 11.0* 12/10/2011   HCT 32.8* 12/10/2011   MCV 94.3 12/10/2011   PLT 162 12/10/2011      Chemistry      Component Value Date/Time   NA 138 08/06/2011 1400   K 4.1 08/06/2011 1400   CL 101 08/06/2011 1400   CO2 28 08/06/2011 1400   BUN 13 08/06/2011 1400   CREATININE 0.72 08/06/2011 1400      Component Value Date/Time   CALCIUM 9.3 08/06/2011 1400   ALKPHOS 51 07/17/2011 1229   AST 20 07/17/2011 1229   ALT 20 07/17/2011 1229   BILITOT 0.5 07/17/2011 1229       Lab Results  Component Value Date   LABCA2 11 07/17/2011     STUDIES: No recent studies. Patient is due for her next annual mammogram in December 2013.   ASSESSMENT:  78 year old [place] woman   (1) status post right lumpectomy 08/08/2011 for a T1c NX invasive ductal carcinoma, grade 1, strongly estrogen and progesterone receptor positive, HER-2 negative, with an MIB-1 of less than 10.  (2)  Began on tamoxifen in February 2013  PLAN: Gabrielle Warren appears to be tolerating the tamoxifen well. We will initiate six-month followups at this time, and  we'll see her every 6 months for the first 2 years, then annually until she completes 5 years of therapy.  In the meanwhile, Gabrielle Warren we'll continue to followup with her other physicians, including Dr. Jarold Warren and Dr. Laury Axon.  She has an appointment with Dr. Laury Axon next month, and will discuss with her her reduced hemoglobin and associated anemia. I will note that this appears to be a chronic issue. I will also make note of the fact that the patient has never had a colonoscopy to my knowledge.  She knows to call for any problems that may develop before the next visit    Gabrielle Warren    12/10/2011

## 2011-12-10 NOTE — Telephone Encounter (Signed)
gave patient appointment for mammogram on 06-22-2012 at 3:00pm gave patient appointment for lab only on 07-10-2011 at 9:00am 07-17-2011 with the md

## 2011-12-18 ENCOUNTER — Other Ambulatory Visit (INDEPENDENT_AMBULATORY_CARE_PROVIDER_SITE_OTHER): Payer: Medicare Other

## 2011-12-18 ENCOUNTER — Telehealth: Payer: Self-pay | Admitting: Family Medicine

## 2011-12-18 DIAGNOSIS — M818 Other osteoporosis without current pathological fracture: Secondary | ICD-10-CM

## 2011-12-18 DIAGNOSIS — E785 Hyperlipidemia, unspecified: Secondary | ICD-10-CM

## 2011-12-18 LAB — LIPID PANEL: Total CHOL/HDL Ratio: 2

## 2011-12-18 NOTE — Telephone Encounter (Signed)
Please call patient after 12 today.

## 2011-12-18 NOTE — Telephone Encounter (Signed)
Tried calling patient but VM not setup yet.     KP

## 2011-12-18 NOTE — Telephone Encounter (Signed)
Please contact patient to go over which meds need to be refilled now. She wants to switch from [facility] to [facility].

## 2011-12-19 ENCOUNTER — Other Ambulatory Visit (HOSPITAL_COMMUNITY): Payer: Self-pay

## 2011-12-19 ENCOUNTER — Telehealth: Payer: Self-pay | Admitting: Cardiology

## 2011-12-19 DIAGNOSIS — I10 Essential (primary) hypertension: Secondary | ICD-10-CM

## 2011-12-19 MED ORDER — METOPROLOL SUCCINATE ER 25 MG PO TB24: 25.0000 mg | ORAL_TABLET | Freq: Two times a day (BID) | ORAL | Status: DC

## 2011-12-19 NOTE — Telephone Encounter (Signed)
Pt advise that Rx have been sent to pharmacy on 08-19-11 and 10-03-11 so all she needs to do is contact pharmacy for refills. Pt indicate that she will contact pharmacy about meds.

## 2011-12-19 NOTE — Telephone Encounter (Signed)
Metoprolol er tab 25mg  twice a day, [facility] [place], only has two tabs left needs refilled today

## 2011-12-19 NOTE — Telephone Encounter (Signed)
..   Requested Prescriptions   Signed Prescriptions Disp Refills  . metoprolol succinate (TOPROL XL) 25 MG 24 hr tablet 180 tablet 3    Sig: Take 1 tablet (25 mg total) by mouth 2 (two) times daily.    Authorizing Provider: HOCHREIN, JAMES    Ordering User: Suriyah Vergara M   

## 2011-12-23 LAB — VITAMIN D 1,25 DIHYDROXY
Vitamin D 1, 25 (OH)2 Total: 22 pg/mL (ref 18–72)
Vitamin D2 1, 25 (OH)2: <8 pg/mL
Vitamin D3 1, 25 (OH)2: 22 pg/mL

## 2012-01-01 ENCOUNTER — Other Ambulatory Visit: Payer: Self-pay | Admitting: *Deleted

## 2012-01-01 DIAGNOSIS — E785 Hyperlipidemia, unspecified: Secondary | ICD-10-CM

## 2012-01-01 MED ORDER — DIAZEPAM 10 MG PO TABS: 10.0000 mg | ORAL_TABLET | Freq: Every evening | ORAL | Status: DC | PRN

## 2012-01-01 MED ORDER — SIMVASTATIN 40 MG PO TABS: 40.0000 mg | ORAL_TABLET | Freq: Every day | ORAL | Status: DC

## 2012-01-01 MED ORDER — ERGOCALCIFEROL 1.25 MG (50000 UT) PO CAPS: 50000.0000 [IU] | ORAL_CAPSULE | ORAL | Status: DC

## 2012-01-01 NOTE — Telephone Encounter (Signed)
Request for 90-day for Simvastatin & Diazepam to [facility]; simvastatin done/SLS Please advise on #90 Diazepam 10 mg.

## 2012-02-10 ENCOUNTER — Telehealth: Payer: Self-pay

## 2012-02-10 NOTE — Telephone Encounter (Signed)
msg from patient and she stated she is on the Vitamin D 50,000 and she had been on it for a little over a month and she started to develop an dry mouth, redness on her lips and they feel uncomfortable, this has been going on for a week and she thinks this is related to the Vitamin-D.  She stated her dentist was out of the office until 01/10/12 Please advise    KP

## 2012-02-10 NOTE — Telephone Encounter (Signed)
Discussed with patient and she voiced understanding and she will call back if no improvement.     KP

## 2012-02-10 NOTE — Telephone Encounter (Signed)
Stop vita D and see if it gets better

## 2012-03-12 ENCOUNTER — Telehealth: Payer: Self-pay | Admitting: Cardiology

## 2012-03-12 DIAGNOSIS — I10 Essential (primary) hypertension: Secondary | ICD-10-CM

## 2012-03-12 MED ORDER — METOPROLOL SUCCINATE ER 25 MG PO TB24: 25.0000 mg | ORAL_TABLET | Freq: Two times a day (BID) | ORAL | Status: DC

## 2012-03-12 NOTE — Telephone Encounter (Signed)
F/u    Patient called back to change RX destination to [facility].

## 2012-03-12 NOTE — Telephone Encounter (Signed)
Pt needs refill of metoprolol @ [facility] [place]

## 2012-03-12 NOTE — Telephone Encounter (Signed)
Completed - sent to [facility] as requested.

## 2012-03-17 ENCOUNTER — Other Ambulatory Visit: Payer: Self-pay | Admitting: Family Medicine

## 2012-03-19 ENCOUNTER — Encounter: Payer: Self-pay | Admitting: Family Medicine

## 2012-03-19 ENCOUNTER — Ambulatory Visit (INDEPENDENT_AMBULATORY_CARE_PROVIDER_SITE_OTHER): Payer: Medicare Other | Admitting: Family Medicine

## 2012-03-19 VITALS — BP 128/78 | HR 61 | Temp 98.2°F | Ht <= 58 in | Wt 103.6 lb

## 2012-03-19 DIAGNOSIS — K5909 Other constipation: Secondary | ICD-10-CM

## 2012-03-19 NOTE — Patient Instructions (Addendum)
Continue the daily miralax the goal is daily BMs The good news is your stomach is growling and not hard Continue to drink lots of fluids Call with any questions or concerns Hang in there!!!

## 2012-03-19 NOTE — Assessment & Plan Note (Signed)
New to provider.  Pt is finding relief w/ Miralax.  Encouraged her to continue.  Reviewed safe dosing patterns.  Reviewed supportive care and red flags that should prompt return.  Pt expressed understanding and is in agreement w/ plan.

## 2012-03-19 NOTE — Progress Notes (Signed)
  Subjective:    Patient ID: Gabrielle Warren, female    DOB: Jan 09, 1926, 78 y.o.   MRN: [id number]  HPI Constipation- chronic problem for pt, will take MoM and biscodol, colace, etc prn.  'everything seemed to shut down' ~1 week ago.  + decreased appetite.  Used enema x2- 'the stool was very hard'.  Saw GYN on Monday and had normal pelvic exam.  GYN recommended Miralax- has taken x3 days.  Yesterday had 3 small BMs and 2 BMs this AM (all were loose).  No N/V, no fevers.  Denies abdominal pain/distension.   Review of Systems For ROS see HPI     Objective:   Physical Exam  Vitals reviewed. Constitutional: She appears well-developed and well-nourished. No distress.  Abdominal: Soft. Bowel sounds are normal. She exhibits no distension and no mass. There is no tenderness. There is no rebound and no guarding.          Assessment & Plan:

## 2012-03-23 ENCOUNTER — Telehealth: Payer: Self-pay | Admitting: Family Medicine

## 2012-03-23 NOTE — Telephone Encounter (Signed)
Caller: Joaquina/Patient; Patient Name: Gabrielle Warren; PCP: Lelon Perla.; Best Callback Phone Number: [phone]; Call regarding Constipation, onset years.  Milk of Mag, Mirilax and Colace taken no relief.  Last bowel movement was watery after enema week of 9-9, Patient feels she has had not good bowel movement.  Afebrile.  All emerent symptoms ruled out per Constipation protocol, see in 24 hours due to no bowel movemtn for 1 week and unresponsive to home care.  Patient is ready for imaging of Abdomen.  Patient seen on 9-12.  PLEASE FOLLOW UP WITH PATIENT IF MD WILL REFER PATIENT FOR FUTHER GI STUDIES.

## 2012-03-23 NOTE — Telephone Encounter (Signed)
Can pt come in so we can decide which kind of imaging to do?

## 2012-03-23 NOTE — Telephone Encounter (Signed)
Seen 03/19/12 with Dr.Tabori, symptoms are no better..  Please advise     KP

## 2012-03-23 NOTE — Telephone Encounter (Signed)
Spoke with the patient and offered her an apt and she said the Miralax is starting to kick in, she stated she she will see how she does tonight and if no improvement she will call in the morning for an apt.Marland Kitchen     KP

## 2012-03-24 ENCOUNTER — Ambulatory Visit (INDEPENDENT_AMBULATORY_CARE_PROVIDER_SITE_OTHER): Payer: Medicare Other | Admitting: Family Medicine

## 2012-03-24 ENCOUNTER — Encounter: Payer: Self-pay | Admitting: Family Medicine

## 2012-03-24 VITALS — BP 128/64 | HR 66 | Temp 98.2°F | Wt 106.2 lb

## 2012-03-24 DIAGNOSIS — K5901 Slow transit constipation: Secondary | ICD-10-CM

## 2012-03-24 NOTE — Progress Notes (Signed)
  Subjective:     Gabrielle Warren is a 78 y.o. female who presents for evaluation of constipation. Onset was several months ago. Patient has been having occasional formed stools per day. Defecation has been incomplete. Co-Morbid conditions:current medication calcium channel blocker. Symptoms have stabilized. Current Health Habits: Eating fiber? yes - oatmeal, fruit, veg Exercise? yes - walking, Adequate hydration? yes - 8 glasses. Current over the counter/prescription laxative: miralax, colace which has been somewhat effective.--- but its is not normal and she ends up using a laxative anyway.  The following portions of the patient's history were reviewed and updated as appropriate: allergies, current medications, past family history, past medical history, past social history, past surgical history and problem list.  Review of Systems Pertinent items are noted in HPI.   Objective:    BP 128/64  Pulse 66  Temp 98.2 F (36.8 C) (Oral)  Wt 106 lb 3.2 oz (48.172 kg)  SpO2 98% General appearance: alert, cooperative, appears stated age and no distress Throat: lips, mucosa, and tongue normal; teeth and gums normal Abdomen: soft, non-tender; bowel sounds normal; no masses,  no organomegaly  Rectal-- heme neg brown stool  Assessment:    Chronic constipation   Plan:    Education about constipation causes and treatment discussed. Plain films (flat plate/upright). Follow up in  2 weeks if symptoms do not improve.  Cont' miralax/ stool softener Increase fiber in your diet

## 2012-03-24 NOTE — Patient Instructions (Signed)

## 2012-03-25 ENCOUNTER — Telehealth: Payer: Self-pay

## 2012-03-25 ENCOUNTER — Ambulatory Visit (INDEPENDENT_AMBULATORY_CARE_PROVIDER_SITE_OTHER)
Admission: RE | Admit: 2012-03-25 | Discharge: 2012-03-25 | Disposition: A | Discharge status: Home / Self Care | Payer: Medicare Other | Source: Ambulatory Visit | Attending: Family Medicine | Admitting: Family Medicine

## 2012-03-25 DIAGNOSIS — K5901 Slow transit constipation: Secondary | ICD-10-CM

## 2012-03-25 MED ORDER — LACTULOSE 20 G PO PACK: 20.0000 g | PACK | Freq: Every day | ORAL | Status: DC

## 2012-03-25 NOTE — Telephone Encounter (Signed)
Discussed with a patient and she voiced understanding. she said she felt better today and was able to go to the restroom but Rx faxed just in case she needed it.       KP

## 2012-03-25 NOTE — Telephone Encounter (Signed)
Message copied by Arnette Norris on Wed Mar 25, 2012  4:28 PM ------      Message from: Lelon Perla      Created: Wed Mar 25, 2012 11:07 AM       Stool and gas in colon      Can do kristalose 20 g po qd prn  #10        Use beano or gasx

## 2012-03-31 ENCOUNTER — Other Ambulatory Visit: Payer: Self-pay | Admitting: Family Medicine

## 2012-03-31 NOTE — Telephone Encounter (Signed)
Rx sent.    MW 

## 2012-03-31 NOTE — Telephone Encounter (Signed)
Last seen 03/24/12 last sent 01/01/12 #90. Plz advise     MW

## 2012-04-01 ENCOUNTER — Telehealth: Payer: Self-pay | Admitting: Family Medicine

## 2012-04-01 ENCOUNTER — Other Ambulatory Visit: Payer: Self-pay | Admitting: Family Medicine

## 2012-04-01 MED ORDER — DIAZEPAM 10 MG PO TABS: 10.0000 mg | ORAL_TABLET | Freq: Every evening | ORAL | Status: DC | PRN

## 2012-04-01 NOTE — Telephone Encounter (Signed)
pt stated  [facility] Dr.Paz denied Diazepam --can you re-send

## 2012-04-01 NOTE — Telephone Encounter (Signed)
lowne pt.

## 2012-04-14 ENCOUNTER — Encounter (INDEPENDENT_AMBULATORY_CARE_PROVIDER_SITE_OTHER): Payer: Self-pay | Admitting: Surgery

## 2012-04-14 ENCOUNTER — Ambulatory Visit (INDEPENDENT_AMBULATORY_CARE_PROVIDER_SITE_OTHER): Payer: Medicare Other | Admitting: Surgery

## 2012-04-14 ENCOUNTER — Encounter: Payer: Self-pay | Admitting: Cardiology

## 2012-04-14 ENCOUNTER — Ambulatory Visit (INDEPENDENT_AMBULATORY_CARE_PROVIDER_SITE_OTHER): Payer: Medicare Other | Admitting: Cardiology

## 2012-04-14 VITALS — BP 143/62 | HR 56 | Ht 59.0 in | Wt 104.2 lb

## 2012-04-14 VITALS — BP 119/66 | HR 80 | Temp 97.1°F | Resp 16 | Ht 59.0 in | Wt 103.8 lb

## 2012-04-14 DIAGNOSIS — Z853 Personal history of malignant neoplasm of breast: Secondary | ICD-10-CM

## 2012-04-14 DIAGNOSIS — I2510 Atherosclerotic heart disease of native coronary artery without angina pectoris: Secondary | ICD-10-CM

## 2012-04-14 DIAGNOSIS — R002 Palpitations: Secondary | ICD-10-CM

## 2012-04-14 DIAGNOSIS — I10 Essential (primary) hypertension: Secondary | ICD-10-CM

## 2012-04-14 NOTE — Progress Notes (Signed)
.  HPI The patient presents for routine followup. Since I saw her she has been doing well.  She had been complaining of palpitations.  She has had no chest pressure, neck or arm discomfort. She has had no new shortness of breath, PND or orthopnea.  She has been walking routinely and does well with this.  I reviewed the BP diary again and her BPs have been well preserved.     Allergies  Allergen Reactions  . Fosamax (Alendronate Sodium) Other (See Comments)    Canker sores and bleeding gums    Current Outpatient Prescriptions  Medication Sig Dispense Refill  . aspirin 81 MG tablet Take 81 mg by mouth daily.        . diazepam (VALIUM) 10 MG tablet Take 1 tablet (10 mg total) by mouth at bedtime as needed for anxiety.  30 tablet  0  . diltiazem (CARDIZEM CD) 120 MG 24 hr capsule Take 1 capsule (120 mg total) by mouth daily.  90 capsule  3  . ergocalciferol (VITAMIN D2) 50000 UNITS capsule Take 1 capsule (50,000 Units total) by mouth once a week.  4 capsule  2  . Ferrous Sulfate (IRON) 90 (18 FE) MG TABS Take 2 tablets by mouth daily.        . fish oil-omega-3 fatty acids 1000 MG capsule Take 2 g by mouth daily.        Marland Kitchen losartan (COZAAR) 100 MG tablet Take 1 tablet (100 mg total) by mouth daily.  90 tablet  1  . metoprolol succinate (TOPROL-XL) 25 MG 24 hr tablet Take 1 tablet (25 mg total) by mouth 2 (two) times daily.  180 tablet  3  . mometasone (NASONEX) 50 MCG/ACT nasal spray Place 2 sprays into the nose daily as needed.       . Multiple Vitamin (MULTIVITAMIN) tablet Take 1 tablet by mouth daily.        Marland Kitchen OVER THE COUNTER MEDICATION Calcium-vitamin D  1800-Vitamin D 1400 mg   1 Tab daily      . OVER THE COUNTER MEDICATION Glucosamine -chrondroitin  1,500mg  (BOTH)  Osteo Bi Flex       . prazosin (MINIPRESS) 1 MG capsule take 2 in am , 1 in evening, 1 at bedtime  360 capsule  1  . simvastatin (ZOCOR) 40 MG tablet take 1 tablet by mouth at bedtime  30 tablet  5  . tamoxifen (NOLVADEX) 20 MG  tablet Daily.      . traMADol (ULTRAM) 50 MG tablet Take 1 tablet (50 mg total) by mouth every 6 (six) hours as needed for pain.  20 tablet  0  . DISCONTD: metoprolol succinate (TOPROL-XL) 25 MG 24 hr tablet 1 tablet by mouth twice a day--Office visit due now  180 tablet  0    Past Medical History  Diagnosis Date  . HYPERTENSION 07/25/2006  . ANXIETY 09/23/2007  . HYPERLIPIDEMIA 07/03/2009  . Atrial tachycardia   . MVP (mitral valve prolapse)   . Arthritis   . Osteoarthritis   . Breast cancer   . Anemia 12/10/2011    Past Surgical History  Procedure Date  . Catart extraction 04/2000, 03/2002  . Carpal tunnel release 09/2004  . Breast surgery 08/08/11    lumpectomy   ROS  As stated in the HPI and negative for all other systems.  PHYSICAL EXAM BP 143/62  Pulse 56  Ht 4\' 11"  (1.499 m)  Wt 47.265 kg (104 lb 3.2 oz)  BMI  21.05 kg/m2 GENERAL:  Well appearing NECK:  No jugular venous distention, waveform within normal limits, carotid upstroke brisk and symmetric, no bruits, no thyromegaly LYMPHATICS:  No cervical, inguinal adenopathy LUNGS:  Clear to auscultation bilaterally CHEST:  Unremarkable HEART:  PMI not displaced or sustained,S1 and S2 within normal limits, no S3, no S4, no clicks, no rubs, no murmurs ABD:  Flat, positive bowel sounds normal in frequency in pitch, no bruits, no rebound, no guarding, no midline pulsatile mass, no hepatomegaly, no splenomegaly EXT:  2 plus pulses throughout, no edema, no cyanosis no clubbing  ASSESSMENT AND PLAN  HYPERTENSION -  Her blood pressures are well controlled. Continue to have somewhat of slow heart rate. However, she will continue the medications as listed. She is tolerating the bradycardia.  PALPITATIONS -  At this point there are no sustained symptomatic tachycardia arrhythmias. No change in therapy is indicated.

## 2012-04-14 NOTE — Progress Notes (Signed)
Subjective:     Patient ID: Gabrielle Warren, female   DOB: 28-Apr-1926, 78 y.o.   MRN: [id number]  HPI She is here for another long-term followup of her right breast cancer. She is status post right breast lobectomy in January. She is  On tamoxifen. She has no complaints  Review of Systems     Objective:   Physical Exam On exam, her right breast incision is well-healed. There is some mild firmness in the upper outer quadrant of the right breast but no distinct mass. There is no axillary adenopathy    Assessment:     Long-term followup right breast cancer    Plan:     She will be seeing the oncologist in January and getting her mammogram. I will see her back in one year unless there is a problem with the films

## 2012-04-14 NOTE — Patient Instructions (Addendum)
The current medical regimen is effective;  continue present plan and medications.  Follow up in 6 months with Dr Hochrein.  You will receive a letter in the mail 2 months before you are due.  Please call us when you receive this letter to schedule your follow up appointment.  

## 2012-04-24 ENCOUNTER — Telehealth: Payer: Self-pay

## 2012-04-24 NOTE — Telephone Encounter (Signed)
Pt called left message on triage line stating received bill for xray and hasn't had one done.  I called pt back and gave her billing number and advise her we don't handle billing.   MW

## 2012-04-29 ENCOUNTER — Other Ambulatory Visit: Payer: Self-pay | Admitting: Family Medicine

## 2012-04-29 NOTE — Telephone Encounter (Signed)
Last seen 03/24/12 and Valium filled 04/01/12 and Prazosin filled 08/19/11 # 360 with 1 refill. Please advise      KP

## 2012-04-29 NOTE — Telephone Encounter (Signed)
Diazepam #30 Prazosin #90 (1 month supply)

## 2012-05-07 ENCOUNTER — Telehealth: Payer: Self-pay | Admitting: Family Medicine

## 2012-05-07 MED ORDER — SIMVASTATIN 40 MG PO TABS: 40.0000 mg | ORAL_TABLET | Freq: Every day | ORAL | Status: DC

## 2012-05-07 NOTE — Telephone Encounter (Signed)
Refill: Simvastatin 40mg  tablet. Take 1 tablet by mouth at bedtime. Patient requesting 90 day supply.

## 2012-05-20 ENCOUNTER — Other Ambulatory Visit: Payer: Self-pay | Admitting: Family Medicine

## 2012-05-20 NOTE — Telephone Encounter (Signed)
Rx sent.    MW 

## 2012-05-29 ENCOUNTER — Other Ambulatory Visit: Payer: Self-pay | Admitting: Family Medicine

## 2012-05-29 NOTE — Telephone Encounter (Signed)
Last 03/24/12 and filled 04/29/12 # 30. Please advise     KP

## 2012-06-01 ENCOUNTER — Other Ambulatory Visit: Payer: Self-pay | Admitting: Family Medicine

## 2012-06-01 MED ORDER — PRAZOSIN HCL 1 MG PO CAPS: ORAL_CAPSULE | ORAL | Status: DC

## 2012-06-01 NOTE — Telephone Encounter (Signed)
wants 90-day supply of prazosin, we sent #120 on 10.23.13

## 2012-06-15 ENCOUNTER — Ambulatory Visit
Admission: RE | Admit: 2012-06-15 | Discharge: 2012-06-15 | Disposition: A | Discharge status: Home / Self Care | Payer: Medicare Other | Source: Ambulatory Visit | Attending: Physician Assistant | Admitting: Physician Assistant

## 2012-06-15 DIAGNOSIS — Z853 Personal history of malignant neoplasm of breast: Secondary | ICD-10-CM

## 2012-06-22 ENCOUNTER — Encounter: Payer: Self-pay | Admitting: Family Medicine

## 2012-06-22 ENCOUNTER — Encounter: Payer: Self-pay | Admitting: Lab

## 2012-06-22 ENCOUNTER — Ambulatory Visit (INDEPENDENT_AMBULATORY_CARE_PROVIDER_SITE_OTHER): Payer: Medicare Other | Admitting: Family Medicine

## 2012-06-22 VITALS — BP 132/76 | HR 65 | Temp 97.6°F | Ht <= 58 in | Wt 103.4 lb

## 2012-06-22 DIAGNOSIS — Z0000 Encounter for general adult medical examination without abnormal findings: Secondary | ICD-10-CM

## 2012-06-22 DIAGNOSIS — M818 Other osteoporosis without current pathological fracture: Secondary | ICD-10-CM

## 2012-06-22 DIAGNOSIS — I10 Essential (primary) hypertension: Secondary | ICD-10-CM

## 2012-06-22 DIAGNOSIS — E785 Hyperlipidemia, unspecified: Secondary | ICD-10-CM

## 2012-06-22 DIAGNOSIS — N390 Urinary tract infection, site not specified: Secondary | ICD-10-CM

## 2012-06-22 DIAGNOSIS — I2510 Atherosclerotic heart disease of native coronary artery without angina pectoris: Secondary | ICD-10-CM

## 2012-06-22 DIAGNOSIS — E559 Vitamin D deficiency, unspecified: Secondary | ICD-10-CM

## 2012-06-22 DIAGNOSIS — C50119 Malignant neoplasm of central portion of unspecified female breast: Secondary | ICD-10-CM

## 2012-06-22 DIAGNOSIS — Z1331 Encounter for screening for depression: Secondary | ICD-10-CM

## 2012-06-22 LAB — BASIC METABOLIC PANEL WITH GFR
BUN: 11 mg/dL (ref 6–23)
CO2: 28 meq/L (ref 19–32)
Calcium: 8.9 mg/dL (ref 8.4–10.5)
Chloride: 101 meq/L (ref 96–112)
Creatinine, Ser: 0.8 mg/dL (ref 0.4–1.2)
GFR: 75.9 mL/min
Glucose, Bld: 128 mg/dL — ABNORMAL HIGH (ref 70–99)
Potassium: 3.8 meq/L (ref 3.5–5.1)
Sodium: 135 meq/L (ref 135–145)

## 2012-06-22 LAB — POCT URINALYSIS DIPSTICK
Bilirubin, UA: NEGATIVE
Blood, UA: NEGATIVE
Glucose, UA: NEGATIVE
Ketones, UA: NEGATIVE
Nitrite, UA: NEGATIVE
Protein, UA: NEGATIVE
Spec Grav, UA: <=1.005
Urobilinogen, UA: 0.2
pH, UA: 7.5

## 2012-06-22 LAB — CBC WITH DIFFERENTIAL/PLATELET
Basophils Absolute: 0 K/uL (ref 0.0–0.1)
Basophils Relative: 0.8 % (ref 0.0–3.0)
Eosinophils Absolute: 0.2 K/uL (ref 0.0–0.7)
Eosinophils Relative: 3 % (ref 0.0–5.0)
HCT: 32.5 % — ABNORMAL LOW (ref 36.0–46.0)
Hemoglobin: 11 g/dL — ABNORMAL LOW (ref 12.0–15.0)
Lymphocytes Relative: 21.6 % (ref 12.0–46.0)
Lymphs Abs: 1.2 K/uL (ref 0.7–4.0)
MCHC: 33.9 g/dL (ref 30.0–36.0)
MCV: 94.8 fl (ref 78.0–100.0)
Monocytes Absolute: 0.6 K/uL (ref 0.1–1.0)
Monocytes Relative: 10.4 % (ref 3.0–12.0)
Neutro Abs: 3.6 K/uL (ref 1.4–7.7)
Neutrophils Relative %: 64.2 % (ref 43.0–77.0)
Platelets: 173 K/uL (ref 150.0–400.0)
RBC: 3.43 Mil/uL — ABNORMAL LOW (ref 3.87–5.11)
RDW: 12.8 % (ref 11.5–14.6)
WBC: 5.6 K/uL (ref 4.5–10.5)

## 2012-06-22 LAB — LIPID PANEL
Cholesterol: 123 mg/dL (ref 0–200)
HDL: 62.2 mg/dL
LDL Cholesterol: 43 mg/dL (ref 0–99)
Total CHOL/HDL Ratio: 2
Triglycerides: 87 mg/dL (ref 0.0–149.0)
VLDL: 17.4 mg/dL (ref 0.0–40.0)

## 2012-06-22 LAB — HEPATIC FUNCTION PANEL
ALT: 20 U/L (ref 0–35)
AST: 23 U/L (ref 0–37)
Albumin: 3.6 g/dL (ref 3.5–5.2)
Alkaline Phosphatase: 45 U/L (ref 39–117)
Bilirubin, Direct: 0 mg/dL (ref 0.0–0.3)
Total Bilirubin: 0.7 mg/dL (ref 0.3–1.2)
Total Protein: 6.8 g/dL (ref 6.0–8.3)

## 2012-06-22 MED ORDER — VITAMIN D 1000 UNITS PO TABS: ORAL_TABLET | ORAL | Status: DC

## 2012-06-22 NOTE — Assessment & Plan Note (Signed)
Check labs con't meds 

## 2012-06-22 NOTE — Addendum Note (Signed)
Addended by: Silvio Pate D on: 06/22/2012 03:07 PM   Modules accepted: Orders

## 2012-06-22 NOTE — Progress Notes (Signed)
Subjective:    Gabrielle Warren is a 78 y.o. female who presents for Medicare Annual/Subsequent preventive examination.  Preventive Screening-Counseling & Management  Tobacco History  Smoking status  . Former Smoker  . Quit date: 07/08/1994  Smokeless tobacco  . Never Used     Problems Prior to Visit 1. Dizzy ---  Fullness in R ear Current Problems (verified) Patient Active Problem List  Diagnosis  . TINEA CORPORIS  . IMPAIRED GLUCOSE TOLERANCE  . HYPERLIPIDEMIA  . ANXIETY  . OTOGENIC PAIN  . HYPERTENSION  . EXTERNAL HEMORRHOIDS  . DRY MOUTH  . APHTHOUS STOMATITIS  . CONSTIPATION, CHRONIC  . POSTMENOPAUSAL STATUS  . OSTEOARTHRITIS  . OTHER OSTEOPOROSIS  . PALPITATIONS  . BRUIT  . DYSURIA  . PELVIC  PAIN  . LACERATION, RIGHT THUMB  . CAD (coronary artery disease)  . UTI (urinary tract infection)  . Cancer of upper-inner quadrant of female breast  . Malignant neoplasm of central portion of breast  . Invasive ductal carcinoma of breast, stage 1  . Anemia    Medications Prior to Visit Current Outpatient Prescriptions on File Prior to Visit  Medication Sig Dispense Refill  . aspirin 81 MG tablet Take 81 mg by mouth daily.        . diazepam (VALIUM) 10 MG tablet take 1 tablet by mouth at bedtime if needed for anxiety  30 tablet  0  . diltiazem (CARDIZEM CD) 120 MG 24 hr capsule Take 1 capsule (120 mg total) by mouth daily.  90 capsule  3  . Ferrous Sulfate (IRON) 90 (18 FE) MG TABS Take 2 tablets by mouth daily.        . fish oil-omega-3 fatty acids 1000 MG capsule Take 2 g by mouth daily.        Marland Kitchen losartan (COZAAR) 100 MG tablet take 1 tablet by mouth once daily  90 tablet  1  . mometasone (NASONEX) 50 MCG/ACT nasal spray Place 2 sprays into the nose daily as needed.       . Multiple Vitamin (MULTIVITAMIN) tablet Take 1 tablet by mouth daily.        Marland Kitchen OVER THE COUNTER MEDICATION Calcium-vitamin D  1800-Vitamin D 1400 mg   1 Tab daily      . OVER THE COUNTER  MEDICATION Glucosamine -chrondroitin  1,500mg  (BOTH)  Osteo Bi Flex       . prazosin (MINIPRESS) 1 MG capsule Take 2 caps by mouth every morning, 1 every evening and 1 at bedtime  360 capsule  1  . simvastatin (ZOCOR) 40 MG tablet Take 1 tablet (40 mg total) by mouth at bedtime.  30 tablet  2  . tamoxifen (NOLVADEX) 20 MG tablet Take 20 mg by mouth daily.       . Vitamin D, Ergocalciferol, (DRISDOL) 50000 UNITS CAPS take 1 capsule by mouth every week  4 capsule  2    Current Medications (verified) Current Outpatient Prescriptions  Medication Sig Dispense Refill  . aspirin 81 MG tablet Take 81 mg by mouth daily.        . diazepam (VALIUM) 10 MG tablet take 1 tablet by mouth at bedtime if needed for anxiety  30 tablet  0  . diltiazem (CARDIZEM CD) 120 MG 24 hr capsule Take 1 capsule (120 mg total) by mouth daily.  90 capsule  3  . Ferrous Sulfate (IRON) 90 (18 FE) MG TABS Take 2 tablets by mouth daily.        Marland Kitchen  fish oil-omega-3 fatty acids 1000 MG capsule Take 2 g by mouth daily.        Marland Kitchen losartan (COZAAR) 100 MG tablet take 1 tablet by mouth once daily  90 tablet  1  . metoprolol succinate (TOPROL-XL) 25 MG 24 hr tablet Take 1 tablet by mouth Twice daily.      . mometasone (NASONEX) 50 MCG/ACT nasal spray Place 2 sprays into the nose daily as needed.       . Multiple Vitamin (MULTIVITAMIN) tablet Take 1 tablet by mouth daily.        Marland Kitchen OVER THE COUNTER MEDICATION Calcium-vitamin D  1800-Vitamin D 1400 mg   1 Tab daily      . OVER THE COUNTER MEDICATION Glucosamine -chrondroitin  1,500mg  (BOTH)  Osteo Bi Flex       . prazosin (MINIPRESS) 1 MG capsule Take 2 caps by mouth every morning, 1 every evening and 1 at bedtime  360 capsule  1  . simvastatin (ZOCOR) 40 MG tablet Take 1 tablet (40 mg total) by mouth at bedtime.  30 tablet  2  . tamoxifen (NOLVADEX) 20 MG tablet Take 20 mg by mouth daily.       . Vitamin D, Ergocalciferol, (DRISDOL) 50000 UNITS CAPS take 1 capsule by mouth every week  4  capsule  2     Allergies (verified) Fosamax   PAST HISTORY  Family History Family History  Problem Relation Age of Onset  . Heart failure Mother   . Stroke Father   . Hypertension Other     Social History History  Substance Use Topics  . Smoking status: Former Smoker    Quit date: 07/08/1994  . Smokeless tobacco: Never Used  . Alcohol Use: Yes     Are there smokers in your home (other than you)? No  Risk Factors Current exercise habits: The patient does not participate in regular exercise at present.  Dietary issues discussed: na  Cardiac risk factors: advanced age (older than 21 for men, 76 for women), dyslipidemia, hypertension and sedentary lifestyle.  Depression Screen (Note: if answer to either of the following is "Yes", a more complete depression screening is indicated)   Over the past two weeks, have you felt down, depressed or hopeless? No  Over the past two weeks, have you felt little interest or pleasure in doing things? No  Have you lost interest or pleasure in daily life? No  Do you often feel hopeless? No  Do you cry easily over simple problems? No  Activities of Daily Living In your present state of health, do you have any difficulty performing the following activities?:  Driving? No Managing money?  No Feeding yourself? No Getting from bed to chair? No Climbing a flight of stairs? No Preparing food and eating?: No Bathing or showering? No Getting dressed: No Getting to the toilet? No Using the toilet:No Moving around from place to place: No In the past year have you fallen or had a near fall?:Yes   Are you sexually active?  No  Do you have more than one partner?  No  Hearing Difficulties: No Do you often ask people to speak up or repeat themselves? No Do you experience ringing or noises in your ears? No Do you have difficulty understanding soft or whispered voices? No   Do you feel that you have a problem with memory? No  Do you often  misplace items? No  Do you feel safe at home?  Yes  Cognitive  Testing  Alert? Yes  Normal Appearance?Yes  Oriented to person? Yes  Place? Yes   Time? Yes  Recall of three objects?  Yes  Can perform simple calculations? Yes  Displays appropriate judgment?Yes  Can read the correct time from a watch face?Yes   Advanced Directives have been discussed with the patient? Yes  List the Names of Other Physician/Practitioners you currently use: 1.  Card-- hochrein 2   onc-- magrinot 3 surgeon-- blackman 4 oph-- shapiro 5 dentist-- silva 6 gyn-- Meisinger  Indicate any recent Medical Services you may have received from other than [facility] providers in the past year (date may be approximate).  Immunization History  Administered Date(s) Administered  . Influenza Split 03/28/2011  . Influenza Whole 04/07/2006, 05/06/2007, 03/31/2008, 04/12/2009, 03/27/2010, 03/24/2012  . Pneumococcal Polysaccharide 06/01/2004  . Td 05/21/1999, 09/26/2008    Screening Tests Health Maintenance  Topic Date Due  . Influenza Vaccine  03/08/2013  . Mammogram  06/15/2013  . Tetanus/tdap  09/27/2018  . Colonoscopy  06/19/2021  . Pneumococcal Polysaccharide Vaccine Age 71 And Over  Completed  . Zostavax  Completed    All answers were reviewed with the patient and necessary referrals were made:  Loreen Freud, DO   06/22/2012   History reviewed:  She  has a past medical history of HYPERTENSION (07/25/2006); ANXIETY (09/23/2007); HYPERLIPIDEMIA (07/03/2009); Atrial tachycardia; MVP (mitral valve prolapse); Arthritis; Osteoarthritis; Breast cancer; and Anemia (12/10/2011). She  does not have any pertinent problems on file. She  has past surgical history that includes catart extraction (04/2000, 03/2002); Carpal tunnel release (09/2004); and Breast surgery (08/08/11). Her family history includes Heart failure in her mother; Hypertension in her other; and Stroke in her father. She  reports that she quit smoking about 17  years ago. She has never used smokeless tobacco. She reports that she drinks alcohol. She reports that she does not use illicit drugs. She has a current medication list which includes the following prescription(s): aspirin, diazepam, diltiazem, iron, fish oil-omega-3 fatty acids, losartan, metoprolol succinate, mometasone, multivitamin, OVER THE COUNTER MEDICATION, OVER THE COUNTER MEDICATION, prazosin, simvastatin, tamoxifen, and vitamin d (ergocalciferol). Current Outpatient Prescriptions on File Prior to Visit  Medication Sig Dispense Refill  . aspirin 81 MG tablet Take 81 mg by mouth daily.        . diazepam (VALIUM) 10 MG tablet take 1 tablet by mouth at bedtime if needed for anxiety  30 tablet  0  . diltiazem (CARDIZEM CD) 120 MG 24 hr capsule Take 1 capsule (120 mg total) by mouth daily.  90 capsule  3  . Ferrous Sulfate (IRON) 90 (18 FE) MG TABS Take 2 tablets by mouth daily.        . fish oil-omega-3 fatty acids 1000 MG capsule Take 2 g by mouth daily.        Marland Kitchen losartan (COZAAR) 100 MG tablet take 1 tablet by mouth once daily  90 tablet  1  . mometasone (NASONEX) 50 MCG/ACT nasal spray Place 2 sprays into the nose daily as needed.       . Multiple Vitamin (MULTIVITAMIN) tablet Take 1 tablet by mouth daily.        Marland Kitchen OVER THE COUNTER MEDICATION Calcium-vitamin D  1800-Vitamin D 1400 mg   1 Tab daily      . OVER THE COUNTER MEDICATION Glucosamine -chrondroitin  1,500mg  (BOTH)  Osteo Bi Flex       . prazosin (MINIPRESS) 1 MG capsule Take 2 caps by mouth every  morning, 1 every evening and 1 at bedtime  360 capsule  1  . simvastatin (ZOCOR) 40 MG tablet Take 1 tablet (40 mg total) by mouth at bedtime.  30 tablet  2  . tamoxifen (NOLVADEX) 20 MG tablet Take 20 mg by mouth daily.       . Vitamin D, Ergocalciferol, (DRISDOL) 50000 UNITS CAPS take 1 capsule by mouth every week  4 capsule  2   She is allergic to fosamax.  Review of Systems.   Review of Systems  Constitutional: Negative for  activity change, appetite change and fatigue.  HENT: Negative for hearing loss, congestion, tinnitus and ear discharge.   Eyes: Negative for visual disturbance (see optho q1y -- vision corrected to 20/20 with glasses).  Respiratory: Negative for cough, chest tightness and shortness of breath.   Cardiovascular: Negative for chest pain, palpitations and leg swelling.  Gastrointestinal: Negative for abdominal pain, diarrhea, constipation and abdominal distention.  Genitourinary: Negative for urgency, frequency, decreased urine volume and difficulty urinating.  Musculoskeletal: Negative for back pain, arthralgias and gait problem.  Skin: Negative for color change, pallor and rash.  Neurological: Negative for dizziness, light-headedness, numbness and headaches.  Hematological: Negative for adenopathy. Does not bruise/bleed easily.  Psychiatric/Behavioral: Negative for suicidal ideas, confusion, sleep disturbance, self-injury, dysphoric mood, decreased concentration and agitation.  Pt is able to read and write and can do all ADLs + risk for falling No abuse/ violence in home     Objective:     Vision by Snellen chart: opt  Body mass index is 22.38 kg/(m^2). BP 132/76  Pulse 65  Temp 97.6 F (36.4 C) (Oral)  Ht 4\' 9"  (1.448 m)  Wt 103 lb 6.4 oz (46.902 kg)  BMI 22.38 kg/m2  SpO2 98%  BP 132/76  Pulse 65  Temp 97.6 F (36.4 C) (Oral)  Ht 4\' 9"  (1.448 m)  Wt 103 lb 6.4 oz (46.902 kg)  BMI 22.38 kg/m2  SpO2 98% General appearance: alert, cooperative, appears stated age and no distress Head: Normocephalic, without obvious abnormality, atraumatic Eyes: negative findings: lids and lashes normal, corneas clear and pupils equal, round, reactive to light and accomodation Ears: normal TM and external ear canal left ear, abnormal TM right ear - diminished mobility and dull and normal TM&#39;s and external ear canals both ears Nose: Nares normal. Septum midline. Mucosa normal. No drainage or  sinus tenderness. Throat: lips, mucosa, and tongue normal; teeth and gums normal Neck: no adenopathy, no carotid bruit, no JVD, supple, symmetrical, trachea midline and thyroid not enlarged, symmetric, no tenderness/mass/nodules Back: negative Lungs: clear to auscultation bilaterally Breasts: normal appearance, no masses or tenderness Heart: regular rate and rhythm, S1, S2 normal, no murmur, click, rub or gallop Abdomen: soft, non-tender; bowel sounds normal; no masses,  no organomegaly Pelvic: deferred--gyn Extremities: extremities normal, atraumatic, no cyanosis or edema Pulses: 2+ and symmetric Skin: Skin color, texture, turgor normal. No rashes or lesions Lymph nodes: Cervical, supraclavicular, and axillary nodes normal. Neurologic: Alert and oriented X 3, normal strength and tone. Normal symmetric reflexes. Normal coordination and gait Psych-- no anxiety, depression     Assessment:     cpe     Plan:     During the course of the visit the patient was educated and counseled about appropriate screening and preventive services including:    Pneumococcal vaccine   Influenza vaccine  Screening mammography  Screening Pap smear and pelvic exam   Bone densitometry screening  Colorectal cancer screening  Diabetes  screening  Advanced directives: has an advanced directive - a copy has been provided  Diet review for nutrition referral? Yes ____  Not Indicated ___x_   Patient Instructions (the written plan) was given to the patient.  Medicare Attestation I have personally reviewed: The patient's medical and social history Their use of alcohol, tobacco or illicit drugs Their current medications and supplements The patient's functional ability including ADLs,fall risks, home safety risks, cognitive, and hearing and visual impairment Diet and physical activities Evidence for depression or mood disorders  The patient's weight, height, BMI, and visual acuity have been  recorded in the chart.  I have made referrals, counseling, and provided education to the patient based on review of the above and I have provided the patient with a written personalized care plan for preventive services.     Loreen Freud, DO   06/22/2012

## 2012-06-22 NOTE — Patient Instructions (Addendum)
Preventive Care for Adults, Female A healthy lifestyle and preventive care can promote health and wellness. Preventive health guidelines for women include the following key practices.  A routine yearly physical is a good way to check with your caregiver about your health and preventive screening. It is a chance to share any concerns and updates on your health, and to receive a thorough exam.  Visit your dentist for a routine exam and preventive care every 6 months. Brush your teeth twice a day and floss once a day. Good oral hygiene prevents tooth decay and gum disease.  The frequency of eye exams is based on your age, health, family medical history, use of contact lenses, and other factors. Follow your caregiver's recommendations for frequency of eye exams.  Eat a healthy diet. Foods like vegetables, fruits, whole grains, low-fat dairy products, and lean protein foods contain the nutrients you need without too many calories. Decrease your intake of foods high in solid fats, added sugars, and salt. Eat the right amount of calories for you.Get information about a proper diet from your caregiver, if necessary.  Regular physical exercise is one of the most important things you can do for your health. Most adults should get at least 150 minutes of moderate-intensity exercise (any activity that increases your heart rate and causes you to sweat) each week. In addition, most adults need muscle-strengthening exercises on 2 or more days a week.  Maintain a healthy weight. The body mass index (BMI) is a screening tool to identify possible weight problems. It provides an estimate of body fat based on height and weight. Your caregiver can help determine your BMI, and can help you achieve or maintain a healthy weight.For adults 20 years and older:  A BMI below 18.5 is considered underweight.  A BMI of 18.5 to 24.9 is normal.  A BMI of 25 to 29.9 is considered overweight.  A BMI of 30 and above is  considered obese.  Maintain normal blood lipids and cholesterol levels by exercising and minimizing your intake of saturated fat. Eat a balanced diet with plenty of fruit and vegetables. Blood tests for lipids and cholesterol should begin at age 20 and be repeated every 5 years. If your lipid or cholesterol levels are high, you are over 50, or you are at high risk for heart disease, you may need your cholesterol levels checked more frequently.Ongoing high lipid and cholesterol levels should be treated with medicines if diet and exercise are not effective.  If you smoke, find out from your caregiver how to quit. If you do not use tobacco, do not start.  If you are pregnant, do not drink alcohol. If you are breastfeeding, be very cautious about drinking alcohol. If you are not pregnant and choose to drink alcohol, do not exceed 1 drink per day. One drink is considered to be 12 ounces (355 mL) of beer, 5 ounces (148 mL) of wine, or 1.5 ounces (44 mL) of liquor.  Avoid use of street drugs. Do not share needles with anyone. Ask for help if you need support or instructions about stopping the use of drugs.  High blood pressure causes heart disease and increases the risk of stroke. Your blood pressure should be checked at least every 1 to 2 years. Ongoing high blood pressure should be treated with medicines if weight loss and exercise are not effective.  If you are 55 to 76 years old, ask your caregiver if you should take aspirin to prevent strokes.  Diabetes   screening involves taking a blood sample to check your fasting blood sugar level. This should be done once every 3 years, after age 45, if you are within normal weight and without risk factors for diabetes. Testing should be considered at a younger age or be carried out more frequently if you are overweight and have at least 1 risk factor for diabetes.  Breast cancer screening is essential preventive care for women. You should practice "breast  self-awareness." This means understanding the normal appearance and feel of your breasts and may include breast self-examination. Any changes detected, no matter how small, should be reported to a caregiver. Women in their 20s and 30s should have a clinical breast exam (CBE) by a caregiver as part of a regular health exam every 1 to 3 years. After age 40, women should have a CBE every year. Starting at age 40, women should consider having a mammography (breast X-ray test) every year. Women who have a family history of breast cancer should talk to their caregiver about genetic screening. Women at a high risk of breast cancer should talk to their caregivers about having magnetic resonance imaging (MRI) and a mammography every year.  The Pap test is a screening test for cervical cancer. A Pap test can show cell changes on the cervix that might become cervical cancer if left untreated. A Pap test is a procedure in which cells are obtained and examined from the lower end of the uterus (cervix).  Women should have a Pap test starting at age 21.  Between ages 21 and 29, Pap tests should be repeated every 2 years.  Beginning at age 30, you should have a Pap test every 3 years as long as the past 3 Pap tests have been normal.  Some women have medical problems that increase the chance of getting cervical cancer. Talk to your caregiver about these problems. It is especially important to talk to your caregiver if a new problem develops soon after your last Pap test. In these cases, your caregiver may recommend more frequent screening and Pap tests.  The above recommendations are the same for women who have or have not gotten the vaccine for human papillomavirus (HPV).  If you had a hysterectomy for a problem that was not cancer or a condition that could lead to cancer, then you no longer need Pap tests. Even if you no longer need a Pap test, a regular exam is a good idea to make sure no other problems are  starting.  If you are between ages 65 and 70, and you have had normal Pap tests going back 10 years, you no longer need Pap tests. Even if you no longer need a Pap test, a regular exam is a good idea to make sure no other problems are starting.  If you have had past treatment for cervical cancer or a condition that could lead to cancer, you need Pap tests and screening for cancer for at least 20 years after your treatment.  If Pap tests have been discontinued, risk factors (such as a new sexual partner) need to be reassessed to determine if screening should be resumed.  The HPV test is an additional test that may be used for cervical cancer screening. The HPV test looks for the virus that can cause the cell changes on the cervix. The cells collected during the Pap test can be tested for HPV. The HPV test could be used to screen women aged 30 years and older, and should   be used in women of any age who have unclear Pap test results. After the age of 30, women should have HPV testing at the same frequency as a Pap test.  Colorectal cancer can be detected and often prevented. Most routine colorectal cancer screening begins at the age of 50 and continues through age 75. However, your caregiver may recommend screening at an earlier age if you have risk factors for colon cancer. On a yearly basis, your caregiver may provide home test kits to check for hidden blood in the stool. Use of a small camera at the end of a tube, to directly examine the colon (sigmoidoscopy or colonoscopy), can detect the earliest forms of colorectal cancer. Talk to your caregiver about this at age 50, when routine screening begins. Direct examination of the colon should be repeated every 5 to 10 years through age 75, unless early forms of pre-cancerous polyps or small growths are found.  Hepatitis C blood testing is recommended for all people born from 1945 through 1965 and any individual with known risks for hepatitis C.  Practice  safe sex. Use condoms and avoid high-risk sexual practices to reduce the spread of sexually transmitted infections (STIs). STIs include gonorrhea, chlamydia, syphilis, trichomonas, herpes, HPV, and human immunodeficiency virus (HIV). Herpes, HIV, and HPV are viral illnesses that have no cure. They can result in disability, cancer, and death. Sexually active women aged 25 and younger should be checked for chlamydia. Older women with new or multiple partners should also be tested for chlamydia. Testing for other STIs is recommended if you are sexually active and at increased risk.  Osteoporosis is a disease in which the bones lose minerals and strength with aging. This can result in serious bone fractures. The risk of osteoporosis can be identified using a bone density scan. Women ages 65 and over and women at risk for fractures or osteoporosis should discuss screening with their caregivers. Ask your caregiver whether you should take a calcium supplement or vitamin D to reduce the rate of osteoporosis.  Menopause can be associated with physical symptoms and risks. Hormone replacement therapy is available to decrease symptoms and risks. You should talk to your caregiver about whether hormone replacement therapy is right for you.  Use sunscreen with sun protection factor (SPF) of 30 or more. Apply sunscreen liberally and repeatedly throughout the day. You should seek shade when your shadow is shorter than you. Protect yourself by wearing long sleeves, pants, a wide-brimmed hat, and sunglasses year round, whenever you are outdoors.  Once a month, do a whole body skin exam, using a mirror to look at the skin on your back. Notify your caregiver of new moles, moles that have irregular borders, moles that are larger than a pencil eraser, or moles that have changed in shape or color.  Stay current with required immunizations.  Influenza. You need a dose every fall (or winter). The composition of the flu vaccine  changes each year, so being vaccinated once is not enough.  Pneumococcal polysaccharide. You need 1 to 2 doses if you smoke cigarettes or if you have certain chronic medical conditions. You need 1 dose at age 65 (or older) if you have never been vaccinated.  Tetanus, diphtheria, pertussis (Tdap, Td). Get 1 dose of Tdap vaccine if you are younger than age 65, are over 65 and have contact with an infant, are a healthcare worker, are pregnant, or simply want to be protected from whooping cough. After that, you need a Td   booster dose every 10 years. Consult your caregiver if you have not had at least 3 tetanus and diphtheria-containing shots sometime in your life or have a deep or dirty wound.  HPV. You need this vaccine if you are a woman age 26 or younger. The vaccine is given in 3 doses over 6 months.  Measles, mumps, rubella (MMR). You need at least 1 dose of MMR if you were born in 1957 or later. You may also need a second dose.  Meningococcal. If you are age 19 to 21 and a first-year college student living in a residence hall, or have one of several medical conditions, you need to get vaccinated against meningococcal disease. You may also need additional booster doses.  Zoster (shingles). If you are age 60 or older, you should get this vaccine.  Varicella (chickenpox). If you have never had chickenpox or you were vaccinated but received only 1 dose, talk to your caregiver to find out if you need this vaccine.  Hepatitis A. You need this vaccine if you have a specific risk factor for hepatitis A virus infection or you simply wish to be protected from this disease. The vaccine is usually given as 2 doses, 6 to 18 months apart.  Hepatitis B. You need this vaccine if you have a specific risk factor for hepatitis B virus infection or you simply wish to be protected from this disease. The vaccine is given in 3 doses, usually over 6 months. Preventive Services / Frequency Ages 19 to 39  Blood  pressure check.** / Every 1 to 2 years.  Lipid and cholesterol check.** / Every 5 years beginning at age 20.  Clinical breast exam.** / Every 3 years for women in their 20s and 30s.  Pap test.** / Every 2 years from ages 21 through 29. Every 3 years starting at age 30 through age 65 or 70 with a history of 3 consecutive normal Pap tests.  HPV screening.** / Every 3 years from ages 30 through ages 65 to 70 with a history of 3 consecutive normal Pap tests.  Hepatitis C blood test.** / For any individual with known risks for hepatitis C.  Skin self-exam. / Monthly.  Influenza immunization.** / Every year.  Pneumococcal polysaccharide immunization.** / 1 to 2 doses if you smoke cigarettes or if you have certain chronic medical conditions.  Tetanus, diphtheria, pertussis (Tdap, Td) immunization. / A one-time dose of Tdap vaccine. After that, you need a Td booster dose every 10 years.  HPV immunization. / 3 doses over 6 months, if you are 26 and younger.  Measles, mumps, rubella (MMR) immunization. / You need at least 1 dose of MMR if you were born in 1957 or later. You may also need a second dose.  Meningococcal immunization. / 1 dose if you are age 19 to 21 and a first-year college student living in a residence hall, or have one of several medical conditions, you need to get vaccinated against meningococcal disease. You may also need additional booster doses.  Varicella immunization.** / Consult your caregiver.  Hepatitis A immunization.** / Consult your caregiver. 2 doses, 6 to 18 months apart.  Hepatitis B immunization.** / Consult your caregiver. 3 doses usually over 6 months. Ages 40 to 64  Blood pressure check.** / Every 1 to 2 years.  Lipid and cholesterol check.** / Every 5 years beginning at age 20.  Clinical breast exam.** / Every year after age 40.  Mammogram.** / Every year beginning at age 40   and continuing for as long as you are in good health. Consult with your  caregiver.  Pap test.** / Every 3 years starting at age 30 through age 65 or 70 with a history of 3 consecutive normal Pap tests.  HPV screening.** / Every 3 years from ages 30 through ages 65 to 70 with a history of 3 consecutive normal Pap tests.  Fecal occult blood test (FOBT) of stool. / Every year beginning at age 50 and continuing until age 75. You may not need to do this test if you get a colonoscopy every 10 years.  Flexible sigmoidoscopy or colonoscopy.** / Every 5 years for a flexible sigmoidoscopy or every 10 years for a colonoscopy beginning at age 50 and continuing until age 75.  Hepatitis C blood test.** / For all people born from 1945 through 1965 and any individual with known risks for hepatitis C.  Skin self-exam. / Monthly.  Influenza immunization.** / Every year.  Pneumococcal polysaccharide immunization.** / 1 to 2 doses if you smoke cigarettes or if you have certain chronic medical conditions.  Tetanus, diphtheria, pertussis (Tdap, Td) immunization.** / A one-time dose of Tdap vaccine. After that, you need a Td booster dose every 10 years.  Measles, mumps, rubella (MMR) immunization. / You need at least 1 dose of MMR if you were born in 1957 or later. You may also need a second dose.  Varicella immunization.** / Consult your caregiver.  Meningococcal immunization.** / Consult your caregiver.  Hepatitis A immunization.** / Consult your caregiver. 2 doses, 6 to 18 months apart.  Hepatitis B immunization.** / Consult your caregiver. 3 doses, usually over 6 months. Ages 65 and over  Blood pressure check.** / Every 1 to 2 years.  Lipid and cholesterol check.** / Every 5 years beginning at age 20.  Clinical breast exam.** / Every year after age 40.  Mammogram.** / Every year beginning at age 40 and continuing for as long as you are in good health. Consult with your caregiver.  Pap test.** / Every 3 years starting at age 30 through age 65 or 70 with a 3  consecutive normal Pap tests. Testing can be stopped between 65 and 70 with 3 consecutive normal Pap tests and no abnormal Pap or HPV tests in the past 10 years.  HPV screening.** / Every 3 years from ages 30 through ages 65 or 70 with a history of 3 consecutive normal Pap tests. Testing can be stopped between 65 and 70 with 3 consecutive normal Pap tests and no abnormal Pap or HPV tests in the past 10 years.  Fecal occult blood test (FOBT) of stool. / Every year beginning at age 50 and continuing until age 75. You may not need to do this test if you get a colonoscopy every 10 years.  Flexible sigmoidoscopy or colonoscopy.** / Every 5 years for a flexible sigmoidoscopy or every 10 years for a colonoscopy beginning at age 50 and continuing until age 75.  Hepatitis C blood test.** / For all people born from 1945 through 1965 and any individual with known risks for hepatitis C.  Osteoporosis screening.** / A one-time screening for women ages 65 and over and women at risk for fractures or osteoporosis.  Skin self-exam. / Monthly.  Influenza immunization.** / Every year.  Pneumococcal polysaccharide immunization.** / 1 dose at age 65 (or older) if you have never been vaccinated.  Tetanus, diphtheria, pertussis (Tdap, Td) immunization. / A one-time dose of Tdap vaccine if you are over   65 and have contact with an infant, are a healthcare worker, or simply want to be protected from whooping cough. After that, you need a Td booster dose every 10 years.  Varicella immunization.** / Consult your caregiver.  Meningococcal immunization.** / Consult your caregiver.  Hepatitis A immunization.** / Consult your caregiver. 2 doses, 6 to 18 months apart.  Hepatitis B immunization.** / Check with your caregiver. 3 doses, usually over 6 months. ** Family history and personal history of risk and conditions may change your caregiver's recommendations. Document Released: 08/20/2001 Document Revised: 09/16/2011  Document Reviewed: 11/19/2010 [employer] Patient Information 2013 [employer].  

## 2012-06-22 NOTE — Assessment & Plan Note (Signed)
Stable con't meds 

## 2012-06-22 NOTE — Assessment & Plan Note (Signed)
Per surgery and oncology 

## 2012-06-22 NOTE — Assessment & Plan Note (Signed)
Per cardiology Check labs  

## 2012-06-22 NOTE — Assessment & Plan Note (Signed)
ca and vita d Pt rather not do bmd

## 2012-06-24 LAB — URINE CULTURE: Colony Count: >=100000

## 2012-07-02 ENCOUNTER — Other Ambulatory Visit: Payer: Self-pay | Admitting: Family Medicine

## 2012-07-02 MED ORDER — DIAZEPAM 10 MG PO TABS: ORAL_TABLET | ORAL | Status: DC

## 2012-07-02 NOTE — Telephone Encounter (Signed)
Ok for #30 to continue script written by PCP

## 2012-07-02 NOTE — Telephone Encounter (Signed)
Ok to refill? Last OV 12.16.13.  Last filled 11.22.13.

## 2012-07-02 NOTE — Telephone Encounter (Signed)
Rx sent 

## 2012-07-03 MED ORDER — CIPROFLOXACIN HCL 500 MG PO TABS: 500.0000 mg | ORAL_TABLET | Freq: Two times a day (BID) | ORAL | Status: DC

## 2012-07-03 NOTE — Addendum Note (Signed)
Addended by: Edwena Felty T on: 07/03/2012 04:45 PM   Modules accepted: Orders

## 2012-07-09 ENCOUNTER — Other Ambulatory Visit: Payer: Medicare Other | Admitting: Lab

## 2012-07-16 ENCOUNTER — Ambulatory Visit (HOSPITAL_BASED_OUTPATIENT_CLINIC_OR_DEPARTMENT_OTHER): Payer: Medicare Other | Admitting: Physician Assistant

## 2012-07-16 ENCOUNTER — Ambulatory Visit: Payer: Medicare Other | Admitting: Oncology

## 2012-07-16 ENCOUNTER — Encounter: Payer: Self-pay | Admitting: Physician Assistant

## 2012-07-16 VITALS — BP 146/55 | HR 63 | Temp 97.8°F | Resp 20 | Ht <= 58 in | Wt 107.5 lb

## 2012-07-16 DIAGNOSIS — Z170 Estrogen receptor positive status [ER+]: Secondary | ICD-10-CM

## 2012-07-16 DIAGNOSIS — D649 Anemia, unspecified: Secondary | ICD-10-CM

## 2012-07-16 DIAGNOSIS — C50219 Malignant neoplasm of upper-inner quadrant of unspecified female breast: Secondary | ICD-10-CM

## 2012-07-16 DIAGNOSIS — M818 Other osteoporosis without current pathological fracture: Secondary | ICD-10-CM

## 2012-07-16 DIAGNOSIS — C50919 Malignant neoplasm of unspecified site of unspecified female breast: Secondary | ICD-10-CM

## 2012-07-16 DIAGNOSIS — C50911 Malignant neoplasm of unspecified site of right female breast: Secondary | ICD-10-CM

## 2012-07-16 MED ORDER — TAMOXIFEN CITRATE 20 MG PO TABS: 20.0000 mg | ORAL_TABLET | Freq: Every day | ORAL | Status: DC

## 2012-07-16 NOTE — Progress Notes (Signed)
ID: Gabrielle Warren   DOB: 01/09/1926  MR#: [id number]  WUJ#:[id number]  HISTORY OF PRESENT ILLNESS: Gabrielle Warren is an 79 year old [place] woman who on screening mammography at [facility] was found to have a suspicious mass. I do not have those records, but additional views confirmed the abnormality andb iopsy was attempted 06/19/2011, complicated by bleeding. The patient was then referred to the breast Center whereultrasound-guided biopsy was performed 07/05/2011. This showed an invasive ductal carcinoma, grade 1, HER-2 negative, 100% estrogen receptor positive, 86% progesterone receptor positive, with an MIB-1 of 8%.the patient was then referred for bilateral breast MRIs which showed a 9 mm spiculated enhancing mass in the middle third of the right breast. There was an associated clip artifact. In the contralateral breast, a 5 mm mass was found which was irregular and nodular, with washout genetics. Biopsy of the second mass under MRI guidance (it could not be visualized under ultrasound) was benign.  Patient underwent definitive right lumpectomy on 08/08/2011 for a T1c NX invasive ductal carcinoma. She began on tamoxifen, 20 mg daily, in February 2013. She did not receive radiation therapy.  INTERVAL HISTORY: Gabrielle Warren returns today for routine followup of her right breast carcinoma. She began tamoxifen in early 2013, and is tolerating the medication well. Specifically, she denies any hot flashes, has had no signs of abnormal clotting, no significant change in vision, no vaginal bleeding, and no vaginal discharge.  Interval history is really quite unremarkable, and Gabrielle Warren has been feeling well. She continues to be followed regularly by Dr. Laury Axon, who repeated all of her labs in December. Her hemoglobin has remained Warren at 11.0. She was recently treated for urinary tract infection which has resolved.   REVIEW OF SYSTEMS: Otherwise, Gabrielle Warren has had no recent illnesses and  denies fevers, chills, or night sweats. No rashes or skin changes. No signs of abnormal bleeding. She's had no  nausea or change in bowel habits, although she does tend to have some mild constipation which is not new. No chest pain or shortness of breath. No abnormal headaches, dizziness, or change in vision. No peripheral swelling. She denies any new unusual myalgias, arthralgias, or bony pain.  A detailed review of systems is otherwise noncontributory.   PAST MEDICAL HISTORY: Past Medical History  Diagnosis Date  . HYPERTENSION 07/25/2006  . ANXIETY 09/23/2007  . HYPERLIPIDEMIA 07/03/2009  . Atrial tachycardia   . MVP (mitral valve prolapse)   . Arthritis   . Osteoarthritis   . Breast cancer   . Anemia 12/10/2011    PAST SURGICAL HISTORY: Past Surgical History  Procedure Date  . Catart extraction 04/2000, 03/2002  . Carpal tunnel release 09/2004  . Breast surgery 08/08/11    lumpectomy    FAMILY HISTORY Family History  Problem Relation Age of Onset  . Heart failure Mother   . Stroke Father   . Hypertension Other   The patient's father died suddenly at the age of 2, of unknown causes. The patient's mother died at the age of 75 from complications following a leg amputation. The patient had 2 brothers and 2 sisters. There is no history of breast or ovarian cancer in the immediate family   GYNECOLOGIC HISTORY: The patient had menarche age 52, last menstrual period 26. She took Prempro for many years, stopping only in December of 2012 with this diagnosis. The patient is GX P3, first pregnancy to term age 79.  SOCIAL HISTORY: The patient's maiden name was [place]. Her family originally came from [place].  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 She has 3 children.  Daughter Gabrielle Warren age 68 works in [place] as a Comptroller. Son Gabrielle Warren 53 lives in [place] and is a retired Therapist, occupational. Son Gabrielle Warren( "Gabrielle Warren") 54, lives in [place] and  works in Film/video editor. The patient has no grandchildren. She is alone at home except for a cat, but tells me that her daughter Gabrielle Warren lives nearby.     ADVANCED DIRECTIVES: In place  HEALTH MAINTENANCE: History  Substance Use Topics  . Smoking status: Former Smoker    Quit date: 07/08/1994  . Smokeless tobacco: Never Used  . Alcohol Use: Yes     Colonoscopy: Never  PAP:  2009  Bone density:  2012 at [facility], "Borderline                  Osteoporosis"  Lipid panel:  Followed by Dr. Laury Axon  Allergies  Allergen Reactions  . Fosamax (Alendronate Sodium) Other (See Comments)    Canker sores and bleeding gums    Current Outpatient Prescriptions  Medication Sig Dispense Refill  . aspirin 81 MG tablet Take 81 mg by mouth daily.        . cholecalciferol (VITAMIN D) 1000 UNITS tablet 2 po qd  60 tablet  11  . diazepam (VALIUM) 10 MG tablet take 1 tablet by mouth at bedtime if needed for anxiety  30 tablet  0  . Ferrous Sulfate (IRON) 90 (18 FE) MG TABS Take 2 tablets by mouth daily.        . fish oil-omega-3 fatty acids 1000 MG capsule Take 2 g by mouth daily.        Marland Kitchen losartan (COZAAR) 100 MG tablet take 1 tablet by mouth once daily  90 tablet  1  . metoprolol succinate (TOPROL-XL) 25 MG 24 hr tablet Take 1 tablet by mouth Twice daily.      . mometasone (NASONEX) 50 MCG/ACT nasal spray Place 2 sprays into the nose daily as needed.       . Multiple Vitamin (MULTIVITAMIN) tablet Take 1 tablet by mouth daily.        Marland Kitchen OVER THE COUNTER MEDICATION Calcium-vitamin D  1800-Vitamin D 1400 mg   1 Tab daily      . OVER THE COUNTER MEDICATION Glucosamine -chrondroitin  1,500mg  (BOTH)  Osteo Bi Flex       . prazosin (MINIPRESS) 1 MG capsule Take 2 caps by mouth every morning, 1 every evening and 1 at bedtime  360 capsule  1  . simvastatin (ZOCOR) 40 MG tablet Take 1 tablet (40 mg total) by mouth at bedtime.  30 tablet  2  . tamoxifen (NOLVADEX) 20 MG tablet Take 1 tablet (20 mg total) by mouth daily.   90 tablet  3  . Vitamin D, Ergocalciferol, (DRISDOL) 50000 UNITS CAPS take 1 capsule by mouth every week  4 capsule  2  . diltiazem (CARDIZEM CD) 120 MG 24 hr capsule Take 1 capsule (120 mg total) by mouth daily.  90 capsule  3    OBJECTIVE: Filed Vitals:   07/16/12 1044  BP: 146/55  Pulse: 63  Temp: 97.8 F (36.6 C)  Resp: 20     Body mass index is 23.26 kg/(m^2).    ECOG FS: 1  Filed Weights   07/16/12 1044  Weight: 107 lb 8 oz (48.762 kg)   Elderly white female who appears comfortable and in no acute distress.  Physical Exam:  HEENT:  Sclerae anicteric, conjunctivae pink.  Oropharynx clear.     Nodes:  No cervical, supraclavicular, or axillary lymphadenopathy palpated.  Breast Exam:  Right breast is status post lumpectomy with well-healed incision.  No evidence of local recurrence. Left breast is unremarkable. Lungs:  Clear to auscultation bilaterally.  No crackles, rhonchi, or wheezes.   Heart:  Regular rate and rhythm.   Abdomen:  Soft, thin, nontender.  Positive bowel sounds.   Musculoskeletal:  No focal spinal tenderness to palpation.  Extremities: No peripheral edema or cyanosis.   Neuro:  Nonfocal. Alert and oriented x3.    LAB RESULTS: Lab Results  Component Value Date   WBC 5.6 06/22/2012   NEUTROABS 3.6 06/22/2012   HGB 11.0* 06/22/2012   HCT 32.5* 06/22/2012   MCV 94.8 06/22/2012   PLT 173.0 06/22/2012      Chemistry      Component Value Date/Time   NA 135 06/22/2012 0933   K 3.8 06/22/2012 0933   CL 101 06/22/2012 0933   CO2 28 06/22/2012 0933   BUN 11 06/22/2012 0933   CREATININE 0.8 06/22/2012 0933      Component Value Date/Time   CALCIUM 8.9 06/22/2012 0933   ALKPHOS 45 06/22/2012 0933   AST 23 06/22/2012 0933   ALT 20 06/22/2012 0933   BILITOT 0.7 06/22/2012 0933       Lab Results  Component Value Date   LABCA2 11 07/17/2011     STUDIES: Most recent bilateral mammogram on 06/15/2012 was unremarkable.    ASSESSMENT:   79 year old [place] woman   (1) status post right lumpectomy 08/08/2011 for a T1c NX invasive ductal carcinoma, grade 1, strongly estrogen and progesterone receptor positive, HER-2 negative, with an MIB-1 of less than 10.  (2)  Began on tamoxifen in February 2013  PLAN: Terree seems to be doing quite well with regards to her breast cancer, and will continue on tamoxifen at 20 mg daily. She'll return in July, and we will continue to see her every  6 months for the first 2 years, then annually until she completes 5 years of therapy.  In the meanwhile, Selma will continue to followup with her other physicians, including Dr. Jarold Song and Dr. Laury Axon, and Dr. Laury Axon will continue to follow her mild anemia.    She knows to call for any problems that may develop before the next visit    Lemmie Vanlanen    07/16/2012

## 2012-07-28 ENCOUNTER — Other Ambulatory Visit: Payer: Self-pay | Admitting: Family Medicine

## 2012-07-28 ENCOUNTER — Other Ambulatory Visit: Payer: Self-pay | Admitting: Cardiology

## 2012-07-28 MED ORDER — DIAZEPAM 10 MG PO TABS: ORAL_TABLET | ORAL | Status: DC

## 2012-07-28 NOTE — Telephone Encounter (Signed)
Refill x1 

## 2012-07-28 NOTE — Telephone Encounter (Signed)
Last seen 06/22/12 and filled 07/02/12 #30. Please advise     KP

## 2012-07-28 NOTE — Telephone Encounter (Signed)
refill Diazepam (Tab) 10 MG take 1 tablet by mouth at bedtime if needed for anxiety  last fill 12.26.13 NOTE SENT ESCRIBE TO CARDIOLOGY

## 2012-08-07 ENCOUNTER — Other Ambulatory Visit: Payer: Self-pay | Admitting: *Deleted

## 2012-08-07 MED ORDER — SIMVASTATIN 40 MG PO TABS: 40.0000 mg | ORAL_TABLET | Freq: Every day | ORAL | Status: DC

## 2012-08-07 NOTE — Telephone Encounter (Signed)
Rx sent to the pharmacy by phone-in to Tresa Endo) at [facility].//AB/CMA

## 2012-08-08 ENCOUNTER — Other Ambulatory Visit: Payer: Self-pay | Admitting: Family Medicine

## 2012-08-10 NOTE — Telephone Encounter (Signed)
Vitamin D was not checked since 12/2011 and it was low. Please advise       KP

## 2012-08-26 ENCOUNTER — Other Ambulatory Visit: Payer: Self-pay | Admitting: Family Medicine

## 2012-08-27 NOTE — Telephone Encounter (Signed)
Last seen 06/22/12 and filled 07/28/12 # 30. Please advise     KP

## 2012-08-30 IMAGING — CR DG CHEST 2V
2 series · 2 of 2 positions shown · non-contrast
Comparison: 05/12/2002

CLINICAL DATA: Cough, posterior right rib pain and bruising
following a fall.

CHEST - 2 VIEW

[view not recorded (1 of 2)]
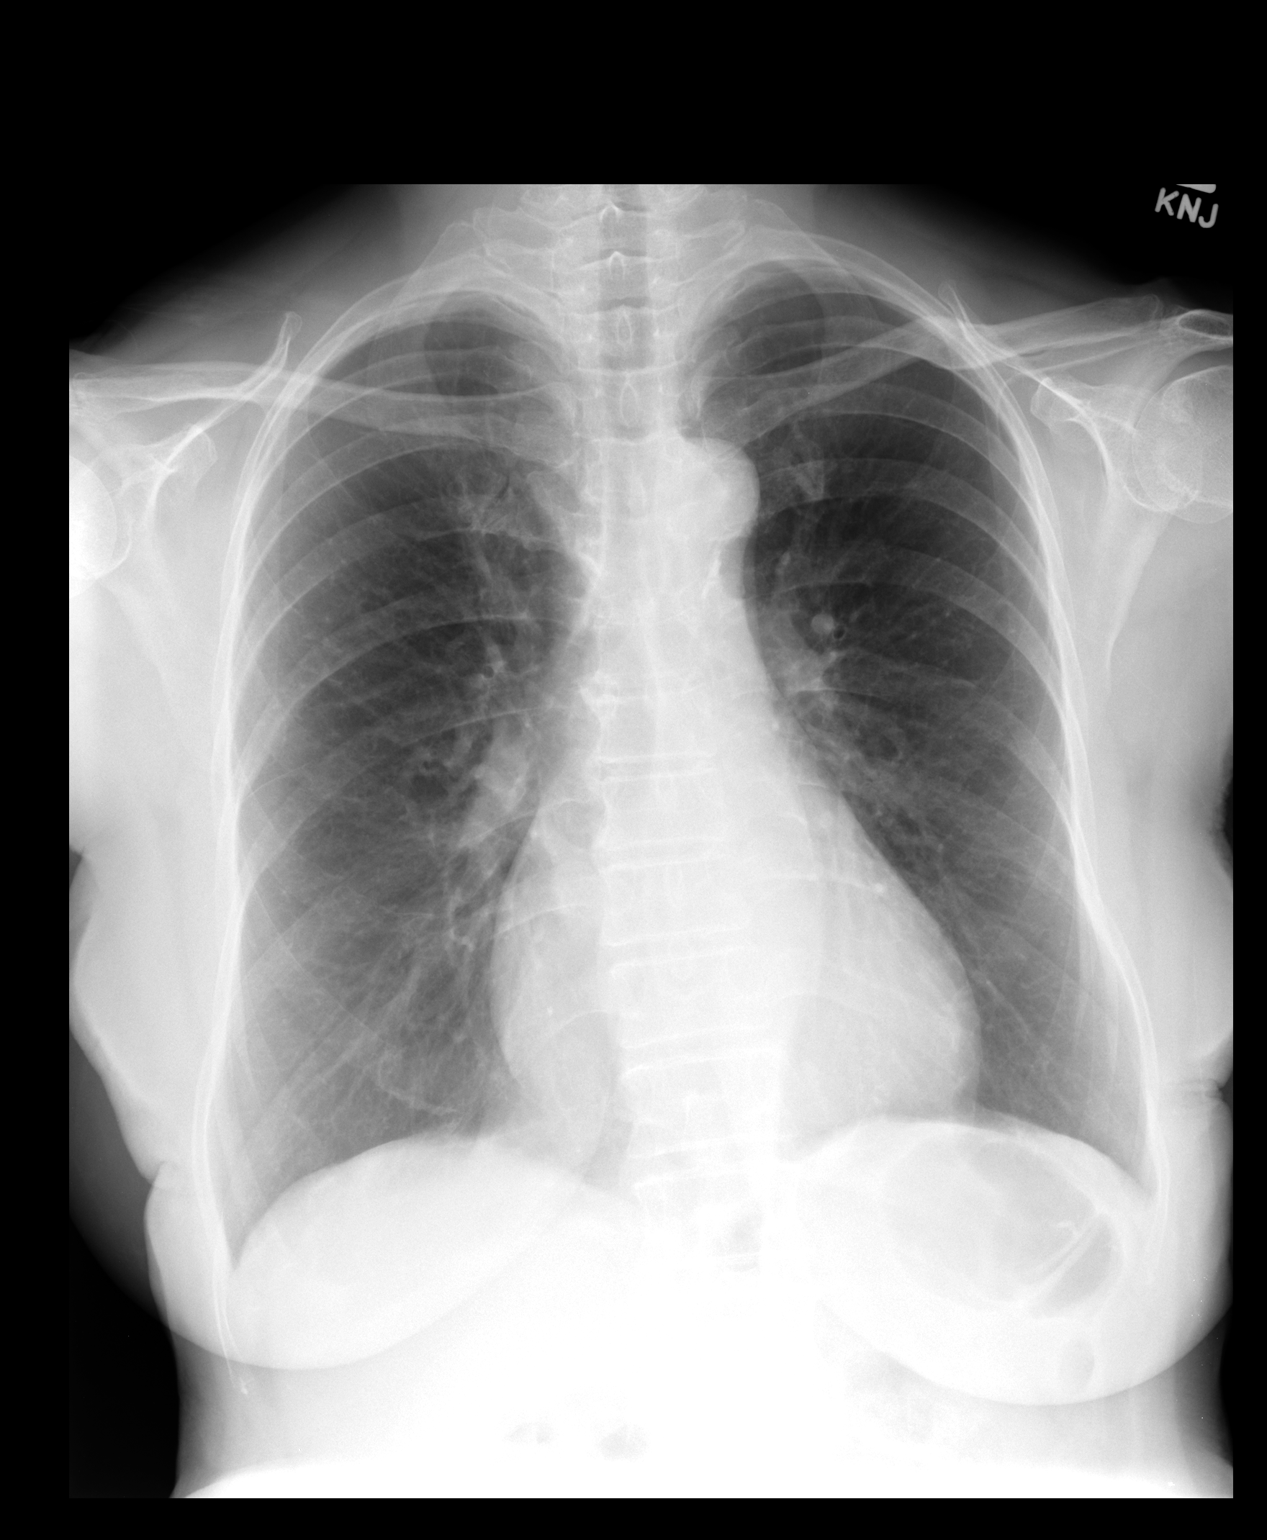

[view not recorded (2 of 2)]
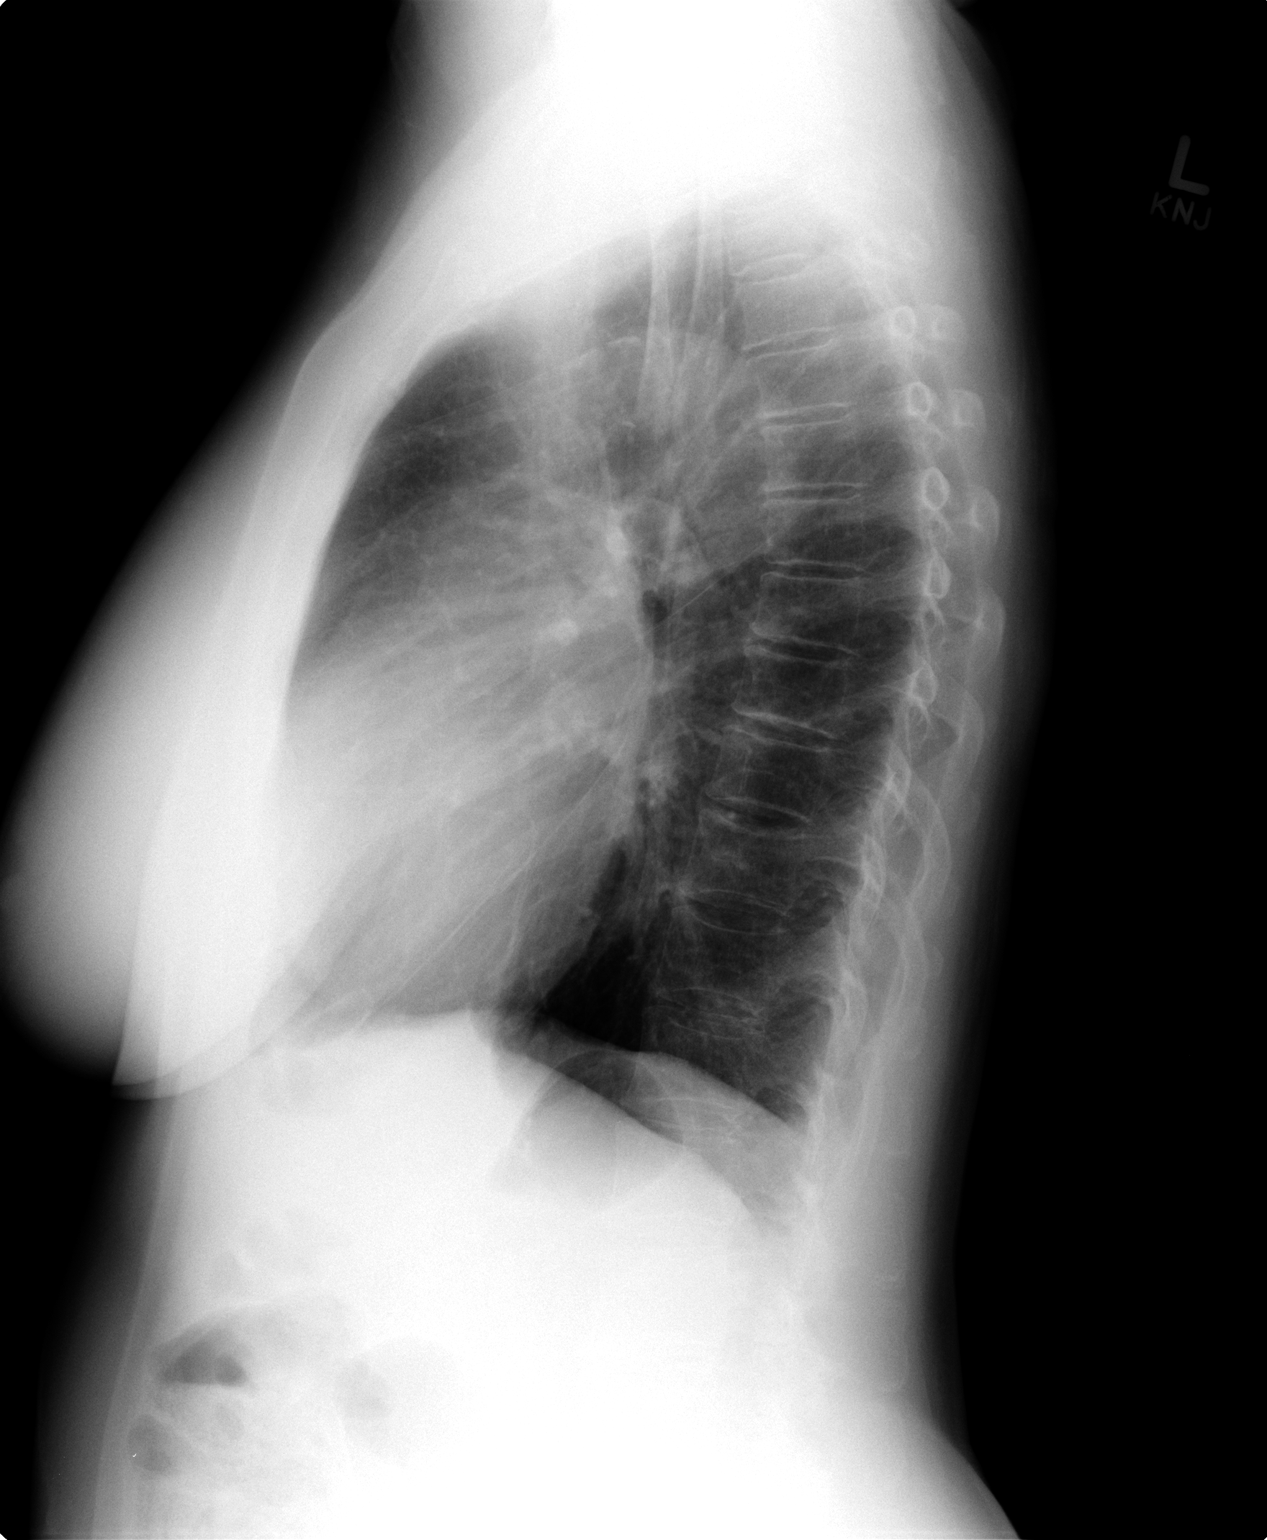

[2 of 2 positions shown; findings below may reference images not displayed]

FINDINGS: The lungs are clear.  The heart is normal in size.  There
is no pneumothorax or pleural fluid.  The upper abdomen is normal.
No displaced rib fractures are seen.
IMPRESSION: No acute findings.  No displaced rib fractures are seen.

## 2012-09-03 ENCOUNTER — Encounter: Payer: Self-pay | Admitting: Family Medicine

## 2012-09-26 ENCOUNTER — Other Ambulatory Visit: Payer: Self-pay | Admitting: Family Medicine

## 2012-09-28 NOTE — Telephone Encounter (Signed)
Last OV 06-22-12, last filled 08-26-12 #30

## 2012-09-29 ENCOUNTER — Telehealth: Payer: Self-pay | Admitting: *Deleted

## 2012-09-29 MED ORDER — DIAZEPAM 10 MG PO TABS: ORAL_TABLET | ORAL | Status: DC

## 2012-09-29 NOTE — Addendum Note (Signed)
Addended by: Candie Echevaria L on: 09/29/2012 12:15 PM   Modules accepted: Orders

## 2012-09-29 NOTE — Telephone Encounter (Signed)
Rx sent 

## 2012-09-29 NOTE — Telephone Encounter (Signed)
Patient left message on triage line requesting refill on diazepam. Last OV 06/22/13 with last refill 1/21 #30. Okay to refill?

## 2012-09-29 NOTE — Telephone Encounter (Signed)
Refill x1   1 refill 

## 2012-10-07 ENCOUNTER — Telehealth: Payer: Self-pay | Admitting: Cardiology

## 2012-10-07 MED ORDER — METOPROLOL SUCCINATE ER 25 MG PO TB24: 25.0000 mg | ORAL_TABLET | Freq: Two times a day (BID) | ORAL | Status: DC

## 2012-10-07 NOTE — Telephone Encounter (Signed)
Fax Received. Refill Completed. Gabrielle Warren (R.M.A)   

## 2012-10-07 NOTE — Telephone Encounter (Signed)
Per pt call states she has been having palpitations for about a week not are not as severe as they were.  She has also noticed her BP has been higher than normal 158/55.  She wonders if her blood pressure monitor is working correctly.  She reports she is going to have her BP checked that at the pharmacy and will take her BP monitor with her.  She will take prn Metoprolol as needed.  She will keep her appointment on Monday as scheduled.

## 2012-10-07 NOTE — Telephone Encounter (Deleted)
error 

## 2012-10-07 NOTE — Telephone Encounter (Signed)
New Prob    Pt is experiencing high BP, fatigue, not feeling well. Concerned and would like to speak to nurse.

## 2012-10-08 ENCOUNTER — Encounter: Payer: Self-pay | Admitting: Family Medicine

## 2012-10-08 ENCOUNTER — Telehealth: Payer: Self-pay | Admitting: Cardiology

## 2012-10-08 ENCOUNTER — Ambulatory Visit (INDEPENDENT_AMBULATORY_CARE_PROVIDER_SITE_OTHER): Payer: Medicare Other | Admitting: Family Medicine

## 2012-10-08 VITALS — BP 150/64 | HR 61 | Temp 98.5°F | Wt 106.4 lb

## 2012-10-08 DIAGNOSIS — R002 Palpitations: Secondary | ICD-10-CM

## 2012-10-08 DIAGNOSIS — R079 Chest pain, unspecified: Secondary | ICD-10-CM

## 2012-10-08 DIAGNOSIS — M1990 Unspecified osteoarthritis, unspecified site: Secondary | ICD-10-CM

## 2012-10-08 MED ORDER — TRAMADOL HCL 50 MG PO TABS: 50.0000 mg | ORAL_TABLET | Freq: Three times a day (TID) | ORAL | Status: DC | PRN

## 2012-10-08 MED ORDER — PRAZOSIN HCL 1 MG PO CAPS: ORAL_CAPSULE | ORAL | Status: DC

## 2012-10-08 MED ORDER — DIAZEPAM 10 MG PO TABS: ORAL_TABLET | ORAL | Status: DC

## 2012-10-08 MED ORDER — METOPROLOL SUCCINATE ER 25 MG PO TB24: 25.0000 mg | ORAL_TABLET | Freq: Two times a day (BID) | ORAL | Status: DC

## 2012-10-08 NOTE — Telephone Encounter (Signed)
Per pharmacy pt is confused about her medications.  According to out records she is to be on metoprolol succinate bid and pt thinks it should be tartrate.  She has an upcoming appt and we will discuss then.  No changes are to be made at this time.

## 2012-10-08 NOTE — Patient Instructions (Signed)
Palpitations  A palpitation is the feeling that your heartbeat is irregular or is faster than normal. It may feel like your heart is fluttering or skipping a beat. Palpitations are usually not a serious problem. However, in some cases, you may need further medical evaluation. CAUSES  Palpitations can be caused by:  Smoking.  Caffeine or other stimulants, such as diet pills or energy drinks.  Alcohol.  Stress and anxiety.  Strenuous physical activity.  Fatigue.  Certain medicines.  Heart disease, especially if you have a history of arrhythmias. This includes atrial fibrillation, atrial flutter, or supraventricular tachycardia.  An improperly working pacemaker or defibrillator. DIAGNOSIS  To find the cause of your palpitations, your caregiver will take your history and perform a physical exam. Tests may also be done, including:  Electrocardiography (ECG). This test records the heart's electrical activity.  Cardiac monitoring. This allows your caregiver to monitor your heart rate and rhythm in real time.  Holter monitor. This is a portable device that records your heartbeat and can help diagnose heart arrhythmias. It allows your caregiver to track your heart activity for several days, if needed.  Stress tests by exercise or by giving medicine that makes the heart beat faster. TREATMENT  Treatment of palpitations depends on the cause of your symptoms and can vary greatly. Most cases of palpitations do not require any treatment other than time, relaxation, and monitoring your symptoms. Other causes, such as atrial fibrillation, atrial flutter, or supraventricular tachycardia, usually require further treatment. HOME CARE INSTRUCTIONS   Avoid:  Caffeinated coffee, tea, soft drinks, diet pills, and energy drinks.  Chocolate.  Alcohol.  Stop smoking if you smoke.  Reduce your stress and anxiety. Things that can help you relax include:  A method that measures bodily functions so  you can learn to control them (biofeedback).  Yoga.  Meditation.  Physical activity such as swimming, jogging, or walking.  Get plenty of rest and sleep. SEEK MEDICAL CARE IF:   You continue to have a fast or irregular heartbeat beyond 24 hours.  Your palpitations occur more often. SEEK IMMEDIATE MEDICAL CARE IF:  You develop chest pain or shortness of breath.  You have a severe headache.  You feel dizzy, or you faint. MAKE SURE YOU:  Understand these instructions.  Will watch your condition.  Will get help right away if you are not doing well or get worse. Document Released: 06/21/2000 Document Revised: 12/24/2011 Document Reviewed: 08/23/2011 Exit[employer] Patient Information 2013 [employer].  

## 2012-10-08 NOTE — Telephone Encounter (Signed)
New problem   Gabrielle Warren from [facility] calling regarding wrong prescription sent for pt

## 2012-10-08 NOTE — Progress Notes (Signed)
Subjective:    Patient here for follow-up of elevated blood pressure.  She is exercising and is adherent to a low-salt diet.  Blood pressure is not well controlled at home. Cardiac symptoms: palpitations. Patient denies: chest pain, chest pressure/discomfort, claudication, dyspnea, exertional chest pressure/discomfort, orthopnea and syncope. Cardiovascular risk factors: advanced age (older than 72 for men, 33 for women) and hypertension. Use of agents associated with hypertension: none. History of target organ damage: cad. Pt spoke with cardiology-- she was told to take metoprolol prn and keep her mon appointment.  The following portions of the patient's history were reviewed and updated as appropriate:  She  has a past medical history of HYPERTENSION (07/25/2006); ANXIETY (09/23/2007); HYPERLIPIDEMIA (07/03/2009); Atrial tachycardia; MVP (mitral valve prolapse); Arthritis; Osteoarthritis; Breast cancer; and Anemia (12/10/2011). She  does not have any pertinent problems on file. She  has past surgical history that includes catart extraction (04/2000, 03/2002); Carpal tunnel release (09/2004); and Breast surgery (08/08/11). Her family history includes Heart failure in her mother; Hypertension in her other; and Stroke in her father. She  reports that she quit smoking about 18 years ago. She has never used smokeless tobacco. She reports that  drinks alcohol. She reports that she does not use illicit drugs. She has a current medication list which includes the following prescription(s): aspirin, cholecalciferol, diazepam, diltiazem, iron, fish oil-omega-3 fatty acids, losartan, metoprolol succinate, mometasone, multivitamin, OVER THE COUNTER MEDICATION, OVER THE COUNTER MEDICATION, prazosin, simvastatin, tamoxifen, vitamin d (ergocalciferol), and tramadol. Current Outpatient Prescriptions on File Prior to Visit  Medication Sig Dispense Refill  . aspirin 81 MG tablet Take 81 mg by mouth daily.        .  cholecalciferol (VITAMIN D) 1000 UNITS tablet 2 po qd  60 tablet  11  . diltiazem (CARDIZEM CD) 120 MG 24 hr capsule take 1 capsule by mouth once daily  30 capsule  PRN  . Ferrous Sulfate (IRON) 90 (18 FE) MG TABS Take 2 tablets by mouth daily.        . fish oil-omega-3 fatty acids 1000 MG capsule Take 2 g by mouth daily.        Marland Kitchen losartan (COZAAR) 100 MG tablet take 1 tablet by mouth once daily  90 tablet  1  . mometasone (NASONEX) 50 MCG/ACT nasal spray Place 2 sprays into the nose daily as needed.       . Multiple Vitamin (MULTIVITAMIN) tablet Take 1 tablet by mouth daily.        Marland Kitchen OVER THE COUNTER MEDICATION Calcium-vitamin D  1800-Vitamin D 1400 mg   1 Tab daily      . OVER THE COUNTER MEDICATION Glucosamine -chrondroitin  1,500mg  (BOTH)  Osteo Bi Flex       . simvastatin (ZOCOR) 40 MG tablet Take 1 tablet (40 mg total) by mouth at bedtime.  90 tablet  3  . tamoxifen (NOLVADEX) 20 MG tablet Take 1 tablet (20 mg total) by mouth daily.  90 tablet  3  . Vitamin D, Ergocalciferol, (DRISDOL) 50000 UNITS CAPS take 1 capsule by mouth every week  4 capsule  5   No current facility-administered medications on file prior to visit.   She is allergic to fosamax..  Review of Systems Pertinent items are noted in HPI.     Objective:    BP 150/64  Pulse 61  Temp(Src) 98.5 F (36.9 C) (Oral)  Wt 106 lb 6.4 oz (48.263 kg)  BMI 23.02 kg/m2  SpO2 98% General appearance: alert,  cooperative and no distress Neck: supple, symmetrical, trachea midline and thyroid not enlarged, symmetric, no tenderness/mass/nodules Lungs: clear to auscultation bilaterally Heart: regular rate and rhythm, S1, S2 normal, no murmur, click, rub or gallop   EKG-- sinus brady Assessment:    Hypertension, uncontrolled. Evidence of target organ damage: CAD.    Plan:    Medication: increase to minipress--- see meds and orders. Dietary sodium restriction. Regular aerobic exercise. Check blood pressures 1 times daily and  record. Follow up: 3 months and as needed. ----or sooner prn---keed cardiology appointment mon

## 2012-10-09 ENCOUNTER — Telehealth: Payer: Self-pay | Admitting: Cardiology

## 2012-10-09 ENCOUNTER — Other Ambulatory Visit: Payer: Self-pay | Admitting: *Deleted

## 2012-10-09 MED ORDER — METOPROLOL TARTRATE 25 MG PO TABS: 25.0000 mg | ORAL_TABLET | ORAL | Status: DC | PRN

## 2012-10-09 NOTE — Telephone Encounter (Signed)
Ok to renew PRN beta blocker as previous.

## 2012-10-09 NOTE — Telephone Encounter (Signed)
Pt aware Rx sent into pharmacy 

## 2012-10-09 NOTE — Telephone Encounter (Signed)
New Problem:    PAtient called in wanting a prescription for metoprolol tartrate.  Please call back.

## 2012-10-09 NOTE — Telephone Encounter (Signed)
Pt calling into office stating that she needs a refill for Metoprolol TARTRATE 25 mg to be taken prn.  She was given this RX 05/2009 as a pill in pocket treatment when she was also on Metoprolol Succinate 200 mg a day.  Advised pt that she does not have an active order for this medication as she is taking Metoprolol Succinate 25 mg BID already.  Pt states she really needs this as she has been having what she believes to be increased heart rate with palpitations and increased blood pressure.  Of note she states she took an old 25 mg metoprolol tartrate from 2011 and she feels like it helped her a little bit.  Pt advised not to use out of date medications.  She reports she is taking succinate 25 mg BID - twelve hours apart but has continued to have problems noticed mostly in the evening.  She does not want to wait for her appt on Monday (4/7) to discuss with Dr  Antoine Poche.  She is aware I will have to review information with Dr Antoine Poche and will call her back to let her know of his orders.  She is in agreement.

## 2012-10-09 NOTE — Telephone Encounter (Signed)
Patient left message on triage line stating that medications ordered yesterday went to mail order pharmacy that she no longer uses. I called [facility] pharmacy on [address] and gave verbal orders to fill Toprol XL, Valium and Minipress. Patient made aware.

## 2012-10-12 ENCOUNTER — Encounter: Payer: Self-pay | Admitting: Cardiology

## 2012-10-12 ENCOUNTER — Ambulatory Visit (INDEPENDENT_AMBULATORY_CARE_PROVIDER_SITE_OTHER): Payer: Medicare Other | Admitting: Cardiology

## 2012-10-12 VITALS — BP 136/66 | HR 63 | Ht <= 58 in | Wt 108.0 lb

## 2012-10-12 DIAGNOSIS — I10 Essential (primary) hypertension: Secondary | ICD-10-CM

## 2012-10-12 DIAGNOSIS — R002 Palpitations: Secondary | ICD-10-CM

## 2012-10-12 DIAGNOSIS — I2510 Atherosclerotic heart disease of native coronary artery without angina pectoris: Secondary | ICD-10-CM

## 2012-10-12 NOTE — Progress Notes (Signed)
HPI The patient presents for routine followup. She called because she was having some increasing palpitations. These were happening last week. She had been more physically active and thought it might have been related to that. She would feel the palpitations and he would leave her fatigued and drained. She did not have syncope. She was not having chest pressure, neck or arm discomfort. She's not been having any new shortness of breath, PND or orthopnea. However, her blood pressure was slightly elevated. She's been more anxious. She did see Loreen Freud, DO and had her dose of Minipress increased and also was given permission for more frequent when necessary Valium. She now thinks her palpitations are improved. She's not really noticing for the last couple of days. Of note we did call in a prescription for her metoprolol immediate release which she took once since this all started. Allergies  Allergen Reactions  . Fosamax (Alendronate Sodium) Other (See Comments)    Canker sores and bleeding gums    Current Outpatient Prescriptions  Medication Sig Dispense Refill  . aspirin 81 MG tablet Take 81 mg by mouth daily.        . cholecalciferol (VITAMIN D) 1000 UNITS tablet 2 po qd  60 tablet  11  . diazepam (VALIUM) 10 MG tablet take 1/2 tab po tid prn  45 tablet  1  . diltiazem (CARDIZEM CD) 120 MG 24 hr capsule take 1 capsule by mouth once daily  30 capsule  PRN  . Ferrous Sulfate (IRON) 90 (18 FE) MG TABS Take 2 tablets by mouth daily.        . fish oil-omega-3 fatty acids 1000 MG capsule Take 2 g by mouth daily.        Marland Kitchen losartan (COZAAR) 100 MG tablet take 1 tablet by mouth once daily  90 tablet  1  . metoprolol succinate (TOPROL-XL) 25 MG 24 hr tablet Take 1 tablet (25 mg total) by mouth 2 (two) times daily.  60 tablet  5  . metoprolol tartrate (LOPRESSOR) 25 MG tablet Take 1 tablet (25 mg total) by mouth as needed.  30 tablet  3  . mometasone (NASONEX) 50 MCG/ACT nasal spray Place 2 sprays  into the nose daily as needed.       . Multiple Vitamin (MULTIVITAMIN) tablet Take 1 tablet by mouth daily.        Marland Kitchen OVER THE COUNTER MEDICATION Calcium-vitamin D  1800-Vitamin D 1400 mg   1 Tab daily      . OVER THE COUNTER MEDICATION Glucosamine -chrondroitin  1,500mg  (BOTH)  Osteo Bi Flex       . prazosin (MINIPRESS) 1 MG capsule Take 2 caps by mouth every morning, 1 every evening and 2 at bedtime  360 capsule  1  . simvastatin (ZOCOR) 40 MG tablet Take 1 tablet (40 mg total) by mouth at bedtime.  90 tablet  3  . tamoxifen (NOLVADEX) 20 MG tablet Take 1 tablet (20 mg total) by mouth daily.  90 tablet  3  . traMADol (ULTRAM) 50 MG tablet Take 1 tablet (50 mg total) by mouth every 8 (eight) hours as needed for pain.  30 tablet  2  . Vitamin D, Ergocalciferol, (DRISDOL) 50000 UNITS CAPS take 1 capsule by mouth every week  4 capsule  5   No current facility-administered medications for this visit.    Past Medical History  Diagnosis Date  . HYPERTENSION 07/25/2006  . ANXIETY 09/23/2007  . HYPERLIPIDEMIA 07/03/2009  .  Atrial tachycardia   . MVP (mitral valve prolapse)   . Arthritis   . Osteoarthritis   . Breast cancer   . Anemia 12/10/2011    Past Surgical History  Procedure Laterality Date  . Catart extraction  04/2000, 03/2002  . Carpal tunnel release  09/2004  . Breast surgery  08/08/11    lumpectomy   ROS  As stated in the HPI and negative for all other systems.  PHYSICAL EXAM BP 136/66  Pulse 63  Ht 4\' 9"  (1.448 m)  Wt 108 lb (48.988 kg)  BMI 23.36 kg/m2 GENERAL:  Well appearing NECK:  No jugular venous distention, waveform within normal limits, carotid upstroke brisk and symmetric, no bruits, no thyromegaly LYMPHATICS:  No cervical, inguinal adenopathy LUNGS:  Clear to auscultation bilaterally CHEST:  Unremarkable HEART:  PMI not displaced or sustained,S1 and S2 within normal limits, no S3, no S4, no clicks, no rubs, no murmurs ABD:  Flat, positive bowel sounds normal in  frequency in pitch, no bruits, no rebound, no guarding, no midline pulsatile mass, no hepatomegaly, no splenomegaly EXT:  2 plus pulses throughout, no edema, no cyanosis no clubbing  EKG:  Sinus rhythm, rate 63, axis within normal limits, intervals within normal limits, no acute ST-T wave changes. 10/12/2012   ASSESSMENT AND PLAN  HYPERTENSION -  Her blood pressures are well controlled on the increased dose of Minipress. She will otherwise continue the meds as listed.  PALPITATIONS -  They seem to be slightly better now. I think taking the Valium helps. I do note that she had a higher or dose of Cardizem before but when she wore a monitor there was one 3 second pause. Therefore, I would be hesitant to increase her Cardizem further. I do think that increased value helps as well.

## 2012-10-12 NOTE — Patient Instructions (Addendum)
The current medical regimen is effective;  continue present plan and medications.  Follow up in 6 months with Dr Hochrein.  You will receive a letter in the mail 2 months before you are due.  Please call us when you receive this letter to schedule your follow up appointment.  

## 2012-11-05 ENCOUNTER — Ambulatory Visit (INDEPENDENT_AMBULATORY_CARE_PROVIDER_SITE_OTHER): Payer: Federal, State, Local not specified - PPO | Admitting: Family Medicine

## 2012-11-05 DIAGNOSIS — S61409A Unspecified open wound of unspecified hand, initial encounter: Secondary | ICD-10-CM

## 2012-11-05 DIAGNOSIS — S61412A Laceration without foreign body of left hand, initial encounter: Secondary | ICD-10-CM

## 2012-11-05 DIAGNOSIS — M79609 Pain in unspecified limb: Secondary | ICD-10-CM

## 2012-11-05 NOTE — Patient Instructions (Addendum)

## 2012-11-05 NOTE — Progress Notes (Signed)
Procedure Note: Verbal consent obtained from the patient.  Metacarpal block with 2 cc Lidocaine 2% without epinephrine.  Wound scrubbed with soap and water.  Wound explored.  No foreign bodies or deep structure injury noted.  Wound closed with 5 simple interrupted sutures and 1 HM/corner stitch (total #6) of 5-0 ethilon.  Area cleansed and dressed.  Wound care discussed.  Pt tolerated the procedure very well.

## 2012-11-05 NOTE — Progress Notes (Signed)
[address]   [place], [place]  [address]   [phone]  Subjective:    Patient ID: Gabrielle Warren, female    DOB: 05-09-1926, 79 y.o.   MRN: [id number]  HPI This 79 y.o. female presents for evaluation of L hand laceration.  Cut  L hand bird house.  Last Tetanus vaccine recently in 09/2008.Marland Kitchen  Excessive bleeding initially.  Full range of motion of fingers and hand.  No numbness or tingling.  Area of cut looks a little swollen.  Takes ASA only; no Coumadin or Plavix.  Chronic OA hands B.  R handed.  2.  HTN: elevated this evening which is unusual for patient.  Baseline blood pressure 130s/80s.  Cardiologist has prescribed Metroprolol Tartate 25mg  to take as needed for elevated blood pressure.  Feeling well; denies chest pain, palpitations, SOB, diaphoresis.  Denies headache, dizziness, vision changes, paresthesias, focal weakness.  Reports compliance with medications as prescribed.    Review of Systems  Constitutional: Negative for fever, chills, diaphoresis and fatigue.  Respiratory: Negative for shortness of breath.   Cardiovascular: Negative for chest pain and palpitations.  Musculoskeletal: Negative for myalgias, joint swelling and arthralgias.  Skin: Positive for wound. Negative for color change.  Neurological: Negative for dizziness, syncope, speech difficulty, weakness, light-headedness, numbness and headaches.  Hematological: Does not bruise/bleed easily.       Past Medical History  Diagnosis Date  . HYPERTENSION 07/25/2006  . ANXIETY 09/23/2007  . HYPERLIPIDEMIA 07/03/2009  . Atrial tachycardia   . MVP (mitral valve prolapse)   . Arthritis   . Osteoarthritis   . Breast cancer   . Anemia 12/10/2011    Current Outpatient Prescriptions on File Prior to Visit  Medication Sig Dispense Refill  . aspirin 81 MG tablet Take 81 mg by mouth daily.        . cholecalciferol (VITAMIN D) 1000 UNITS tablet 2 po qd  60 tablet  11  . diazepam (VALIUM) 10 MG tablet take 1/2 tab po  tid prn  45 tablet  1  . diltiazem (CARDIZEM CD) 120 MG 24 hr capsule take 1 capsule by mouth once daily  30 capsule  PRN  . Ferrous Sulfate (IRON) 90 (18 FE) MG TABS Take 2 tablets by mouth daily.        . fish oil-omega-3 fatty acids 1000 MG capsule Take 2 g by mouth daily.        Marland Kitchen losartan (COZAAR) 100 MG tablet take 1 tablet by mouth once daily  90 tablet  1  . metoprolol succinate (TOPROL-XL) 25 MG 24 hr tablet Take 1 tablet (25 mg total) by mouth 2 (two) times daily.  60 tablet  5  . metoprolol tartrate (LOPRESSOR) 25 MG tablet Take 1 tablet (25 mg total) by mouth as needed.  30 tablet  3  . mometasone (NASONEX) 50 MCG/ACT nasal spray Place 2 sprays into the nose daily as needed.       . Multiple Vitamin (MULTIVITAMIN) tablet Take 1 tablet by mouth daily.        Marland Kitchen OVER THE COUNTER MEDICATION Calcium-vitamin D  1800-Vitamin D 1400 mg   1 Tab daily      . OVER THE COUNTER MEDICATION Glucosamine -chrondroitin  1,500mg  (BOTH)  Osteo Bi Flex       . prazosin (MINIPRESS) 1 MG capsule Take 2 caps by mouth every morning, 1 every evening and 2 at bedtime  360 capsule  1  . simvastatin (ZOCOR) 40 MG  tablet Take 1 tablet (40 mg total) by mouth at bedtime.  90 tablet  3  . tamoxifen (NOLVADEX) 20 MG tablet Take 1 tablet (20 mg total) by mouth daily.  90 tablet  3  . traMADol (ULTRAM) 50 MG tablet Take 1 tablet (50 mg total) by mouth every 8 (eight) hours as needed for pain.  30 tablet  2  . Vitamin D, Ergocalciferol, (DRISDOL) 50000 UNITS CAPS take 1 capsule by mouth every week  4 capsule  5   No current facility-administered medications on file prior to visit.   Objective:   Physical Exam  Nursing note and vitals reviewed. Constitutional: She is oriented to person, place, and time. She appears well-developed and well-nourished. No distress.  HENT:  Head: Normocephalic and atraumatic.  Cardiovascular: Normal rate and regular rhythm.   No murmur heard. Pulmonary/Chest: Effort normal and breath  sounds normal. She has no wheezes. She has no rales.  Musculoskeletal:  L HAND:  FULL EXTENSION/FLEXION OF 5TH DIGIT AT MCP, PIP DIP JOINTS.  Neurological: She is alert and oriented to person, place, and time. She has normal strength. No cranial nerve deficit or sensory deficit. She exhibits normal muscle tone. Coordination normal.  Skin: She is not diaphoretic.  LACERATION HAND PROXIMAL 5TH DIGIT.  GOOD APPROXIMATION; GOOD HEMOSTASIS.  FLAP OF SKIN AT V SHAPED LACERATION RETRACTED.   Psychiatric: She has a normal mood and affect. Her behavior is normal.    PROCEDURE NOTE: SEE SEPARATE PROCEDURE NOTE BY ELIZABETH EAGAN, PA-C.  REPEAT BLOOD PRESSURE READING OF 180/82.      Assessment & Plan:  Pain in hand, left  Laceration of hand, left, initial encounter  Hypertension   1. Pain L hand:  New. Secondary to laceration. 2.  Laceration L hand:  New.  S/p suture repair.  Tetanus UTD in 2010.  Local wound care.  RTC 10 days for suture removal.  RTC sooner for increasing pain, redness, drainage. 3.  Hypertension: uncontrolled; asymptomatic.  Recommend checking blood pressure daily for next two weeks.  Follow-up with PCP if remains elevated.  Recommend taking additional Metoprolol Tartate 25mg  this evening.

## 2012-11-14 ENCOUNTER — Ambulatory Visit (INDEPENDENT_AMBULATORY_CARE_PROVIDER_SITE_OTHER): Payer: Federal, State, Local not specified - PPO | Admitting: Physician Assistant

## 2012-11-14 ENCOUNTER — Encounter: Payer: Self-pay | Admitting: Physician Assistant

## 2012-11-14 VITALS — BP 170/72 | HR 66 | Resp 16

## 2012-11-14 DIAGNOSIS — Z4802 Encounter for removal of sutures: Secondary | ICD-10-CM

## 2012-11-14 NOTE — Progress Notes (Signed)
  Subjective:    Patient ID: Gabrielle Warren, female    DOB: 02/01/1926, 79 y.o.   MRN: [id number]  HPI   Gabrielle Warren is a very pleasant 79 yr old female here for suture removal.  Sutures were placed here 10 days ago.  Pt states she is doing very well.  Denies redness, swelling, pain, drainage.  Has full ROM, though the laceration feels a little tight.      Review of Systems  All other systems reviewed and are negative.       Objective:   Physical Exam  Vitals reviewed. Constitutional: She is oriented to person, place, and time. She appears well-developed and well-nourished. No distress.  HENT:  Head: Normocephalic and atraumatic.  Eyes: Conjunctivae are normal. No scleral icterus.  Pulmonary/Chest: Effort normal.  Neurological: She is alert and oriented to person, place, and time.  Skin: Skin is warm and dry.  Well healed laceration of right hand  Psychiatric: She has a normal mood and affect. Her behavior is normal.     Filed Vitals:   11/14/12 1741  BP: 170/72  Pulse: 66  Resp: 16        Assessment & Plan:  Visit for suture removal   Mr. Laredo is a very pleasant 79 yr old female here for suture removal.  Sutures removed without difficulty.  Laceration is well healed.  No need for further follow- up unless concerns arise.

## 2012-11-18 ENCOUNTER — Other Ambulatory Visit: Payer: Self-pay | Admitting: Family Medicine

## 2012-12-11 ENCOUNTER — Telehealth: Payer: Self-pay | Admitting: *Deleted

## 2012-12-11 NOTE — Telephone Encounter (Signed)
Spoke with patient concerning [employer] testing, answered all questions to pts satisfaction. Advised to call back with any further questions or concerns

## 2012-12-21 ENCOUNTER — Other Ambulatory Visit (INDEPENDENT_AMBULATORY_CARE_PROVIDER_SITE_OTHER): Payer: Medicare Other

## 2012-12-21 ENCOUNTER — Telehealth: Payer: Self-pay | Admitting: Family Medicine

## 2012-12-21 DIAGNOSIS — R002 Palpitations: Secondary | ICD-10-CM

## 2012-12-21 DIAGNOSIS — D649 Anemia, unspecified: Secondary | ICD-10-CM

## 2012-12-21 DIAGNOSIS — E785 Hyperlipidemia, unspecified: Secondary | ICD-10-CM

## 2012-12-21 DIAGNOSIS — R7989 Other specified abnormal findings of blood chemistry: Secondary | ICD-10-CM

## 2012-12-21 DIAGNOSIS — I10 Essential (primary) hypertension: Secondary | ICD-10-CM

## 2012-12-21 MED ORDER — METOPROLOL SUCCINATE ER 25 MG PO TB24: 25.0000 mg | ORAL_TABLET | Freq: Two times a day (BID) | ORAL | Status: DC

## 2012-12-21 MED ORDER — METOPROLOL TARTRATE 25 MG PO TABS: 25.0000 mg | ORAL_TABLET | ORAL | Status: DC | PRN

## 2012-12-21 NOTE — Telephone Encounter (Signed)
This patient is on 2 different doses prescribed by two different providers. Can someone please clarify.  Thank you      KP

## 2012-12-21 NOTE — Telephone Encounter (Signed)
Pt takes both as RXed - one is daily and the other is prn for breakthrough.  [facility] into [facility] requested

## 2012-12-21 NOTE — Telephone Encounter (Signed)
Ok to refill the one we refill

## 2012-12-21 NOTE — Telephone Encounter (Signed)
Patient is requesting a refill of metoprolol to be sent to [facility]

## 2012-12-22 ENCOUNTER — Telehealth: Payer: Self-pay | Admitting: Cardiology

## 2012-12-22 LAB — HEPATIC FUNCTION PANEL
AST: 24 U/L (ref 0–37)
Alkaline Phosphatase: 28 U/L — ABNORMAL LOW (ref 39–117)
Bilirubin, Direct: 0 mg/dL (ref 0.0–0.3)
Total Bilirubin: 0.4 mg/dL (ref 0.3–1.2)

## 2012-12-22 LAB — CBC WITH DIFFERENTIAL/PLATELET
Basophils Absolute: 0.1 10*3/uL (ref 0.0–0.1)
Eosinophils Absolute: 0.4 10*3/uL (ref 0.0–0.7)
Hemoglobin: 11.7 g/dL — ABNORMAL LOW (ref 12.0–15.0)
Lymphocytes Relative: 28.7 % (ref 12.0–46.0)
Lymphs Abs: 2.4 10*3/uL (ref 0.7–4.0)
MCHC: 33.2 g/dL (ref 30.0–36.0)
Neutro Abs: 4.8 10*3/uL (ref 1.4–7.7)
RDW: 12.6 % (ref 11.5–14.6)

## 2012-12-22 LAB — LIPID PANEL
HDL: 54.2 mg/dL (ref >39.00)
Total CHOL/HDL Ratio: 2

## 2012-12-22 LAB — BASIC METABOLIC PANEL
CO2: 27 mEq/L (ref 19–32)
Calcium: 9.3 mg/dL (ref 8.4–10.5)
Glucose, Bld: 100 mg/dL — ABNORMAL HIGH (ref 70–99)
Sodium: 136 mEq/L (ref 135–145)

## 2012-12-22 NOTE — Telephone Encounter (Signed)
Pt calling upset that she had a toxicalogy screen done without her knowledge by Dr Laury Axon.  States she now has to sign a contact with the Doctor's office in order to get her medications and she wants to know if Dr Antoine Poche will RX for her.  Advised Dr Antoine Poche does not RX controlled substances and she will have to obtain them from her PCP.  She states that she will use do without them if that's the case.  She is not having any cardiac complaints.

## 2012-12-22 NOTE — Telephone Encounter (Signed)
New problem   Pt has questions abouther anxiety with medication.. Please call pt

## 2012-12-24 ENCOUNTER — Telehealth: Payer: Self-pay | Admitting: *Deleted

## 2012-12-24 NOTE — Telephone Encounter (Signed)
[facility] aware pt takes Metoprolol succinate 25 mg twice a day and uses tartrate 25 mg prn -- they will fill and send to pt

## 2012-12-24 NOTE — Telephone Encounter (Signed)
VM left from pharmacy stating that clarification of dose for metoprolol is needed.  C/B # [phone].  Reference # [id number]. Called pharmacy back advised them that Dr Antoine Poche is not in our office and updated his contact info with [facility]. Copy of fax forward to Rx provider.

## 2013-01-07 ENCOUNTER — Ambulatory Visit (INDEPENDENT_AMBULATORY_CARE_PROVIDER_SITE_OTHER): Payer: Medicare Other | Admitting: Family Medicine

## 2013-01-07 ENCOUNTER — Encounter: Payer: Self-pay | Admitting: Family Medicine

## 2013-01-07 VITALS — BP 182/72 | HR 52 | Temp 98.8°F | Wt 106.2 lb

## 2013-01-07 DIAGNOSIS — F419 Anxiety disorder, unspecified: Secondary | ICD-10-CM

## 2013-01-07 DIAGNOSIS — I10 Essential (primary) hypertension: Secondary | ICD-10-CM

## 2013-01-07 DIAGNOSIS — F411 Generalized anxiety disorder: Secondary | ICD-10-CM

## 2013-01-07 MED ORDER — DIAZEPAM 10 MG PO TABS: ORAL_TABLET | ORAL | Status: DC

## 2013-01-07 NOTE — Patient Instructions (Addendum)

## 2013-01-08 NOTE — Assessment & Plan Note (Signed)
Pt has been very anxious since stopping valium She has restarted it a would like to wait several more days to see if bp comes down If she is not feeling better in 10-14 days or if more symptoms develop she will rto

## 2013-01-08 NOTE — Assessment & Plan Note (Signed)
Pt restarted valium. It was recommended that we try SSRI but pt was reluctant.  She states she was doing fine with valium until she tried to come off of it.. If symptoms do not improve she will be willing to add something like celexa--- she tried zolof in past but did not like how it made her feel.

## 2013-01-08 NOTE — Progress Notes (Signed)
  Subjective:     Gabrielle Warren is a 79 y.o. female who presents for follow up of anxiety disorder. She has the following anxiety symptoms: difficulty concentrating, insomnia, irritable and panic attacks. Onset of symptoms was approximately long standing--pt tried to come off of valium but anxiety worsened so she restarted it about 3 days ago - . Symptoms have been gradually improving since that time. She denies current suicidal and homicidal ideation. Family history significant for na. Risk factors: none. Previous treatment includes Valium. She complains of the following medication side effects: none. The following portions of the patient's history were reviewed and updated as appropriate: allergies, current medications, past family history, past medical history, past social history, past surgical history and problem list.  Review of Systems Pertinent items are noted in HPI.    Objective:    BP 182/72  Pulse 52  Temp(Src) 98.8 F (37.1 C) (Oral)  Wt 106 lb 3.2 oz (48.172 kg)  BMI 22.98 kg/m2  SpO2 98% General appearance: alert, cooperative, appears stated age and no distress Throat: lips, mucosa, and tongue normal; teeth and gums normal Neck: no adenopathy, no carotid bruit, no JVD, supple, symmetrical, trachea midline and thyroid not enlarged, symmetric, no tenderness/mass/nodules Lungs: clear to auscultation bilaterally Heart: S1, S2 normal Extremities: extremities normal, atraumatic, no cyanosis or edema    Assessment:    anxiety disorder and panic attacks. Possible organic contributing causes are: none.   Plan:    Medications: Valium. Handouts describing disease, natural history, and treatment were given to the patient. Follow up: 3 months. Spent 25 minutes (>50% of visit) discussing the risks of anxiety disorder and panic attacks, the  pathophysiology, etiology, risks, and principles of treatment.

## 2013-01-11 ENCOUNTER — Other Ambulatory Visit (HOSPITAL_BASED_OUTPATIENT_CLINIC_OR_DEPARTMENT_OTHER): Payer: Medicare Other

## 2013-01-11 DIAGNOSIS — C50219 Malignant neoplasm of upper-inner quadrant of unspecified female breast: Secondary | ICD-10-CM

## 2013-01-11 DIAGNOSIS — C50911 Malignant neoplasm of unspecified site of right female breast: Secondary | ICD-10-CM

## 2013-01-11 DIAGNOSIS — C50919 Malignant neoplasm of unspecified site of unspecified female breast: Secondary | ICD-10-CM

## 2013-01-11 LAB — COMPREHENSIVE METABOLIC PANEL (CC13)
AST: 19 U/L (ref 5–34)
Albumin: 3.2 g/dL — ABNORMAL LOW (ref 3.5–5.0)
Alkaline Phosphatase: 39 U/L — ABNORMAL LOW (ref 40–150)
Potassium: 4.2 mEq/L (ref 3.5–5.1)
Sodium: 134 mEq/L — ABNORMAL LOW (ref 136–145)
Total Protein: 6.5 g/dL (ref 6.4–8.3)

## 2013-01-11 LAB — CBC WITH DIFFERENTIAL/PLATELET
EOS%: 2.1 % (ref 0.0–7.0)
MCH: 32.7 pg (ref 25.1–34.0)
MCHC: 35.1 g/dL (ref 31.5–36.0)
MCV: 93.1 fL (ref 79.5–101.0)
MONO%: 8.6 % (ref 0.0–14.0)
NEUT#: 6 10*3/uL (ref 1.5–6.5)
RBC: 3.41 10*6/uL — ABNORMAL LOW (ref 3.70–5.45)
RDW: 12.3 % (ref 11.2–14.5)

## 2013-01-18 ENCOUNTER — Ambulatory Visit (HOSPITAL_BASED_OUTPATIENT_CLINIC_OR_DEPARTMENT_OTHER): Payer: Medicare Other | Admitting: Oncology

## 2013-01-18 VITALS — BP 137/64 | HR 66 | Temp 98.4°F | Resp 20 | Ht <= 58 in | Wt 105.4 lb

## 2013-01-18 DIAGNOSIS — C50119 Malignant neoplasm of central portion of unspecified female breast: Secondary | ICD-10-CM

## 2013-01-18 NOTE — Progress Notes (Signed)
Gabrielle Warren  DOB: 1926-06-15  MR#: [id number]  CSN#: [id number]       PCP: Loreen Freud, DO GYN: Lavina Hamman SU: Abigail Miyamoto OTHER MD: Rollene Rotunda                 History of present illness:   Gabrielle Warren is an 79 year old [place] woman who on screening mammography at [facility] was found to have a suspicious mass. I do not have those records, but additional views confirmed the abnormality andb iopsy was attempted 06/19/2011, complicated by bleeding. The patient was then referred to the breast Center whereultrasound-guided biopsy was performed 07/05/2011. This showed an invasive ductal carcinoma, grade 1, HER-2 negative, 100% estrogen receptor positive, 86% progesterone receptor positive, with an MIB-1 of 8%.the patient was then referred for bilateral breast MRIs which showed a 9 mm spiculated enhancing mass in the middle third of the right breast. There was an associated clip artifact. In the contralateral breast, a 5 mm mass was found which was irregular and nodular, with washout genetics. Biopsy of the second mass under MRI guidance (it could not be visualized under ultrasound) is scheduled for January 10.  Her subsequent history is as detailed below  Interval history: [name] returns today for followup of her breast cancer. Interval history is unremarkable. She is tolerating the tamoxifen with no side effects that she is aware oft Rreview of systems: She describes herself as mildly fatigued. She walks most days, when she doesn't her legs actually feel weaker than when she does. She needs to get her glasses fixed again she says. She has mild sinus problems, mild hoarseness, and occasionally a little dry cough which she attributes to seasonal allergies. She occasionally has palpitations. These are not accompanied by chest pain or pressure or shortness of breath. She has mild heartburn. She has urinary stress incontinence. She bruises easily. She has scattered arthritis  discomfort, which are not more persistent or intense than prior. She describes herself as forgetful but not depressed. A detailed review of systems was otherwise noncontributory   Past medical history:      Past Medical History  Diagnosis Date  . HYPERTENSION 07/25/2006  . ANXIETY 09/23/2007  . HYPERLIPIDEMIA 07/03/2009  . Atrial tachycardia   . MVP (mitral valve prolapse)   . Arthritis   . Osteoarthritis   . Breast cancer   . Anemia 12/10/2011  coronary artery disease, status post remote heart attack (7829)  Past surgical history:      Past Surgical History  Procedure Laterality Date  . Catart extraction  04/2000, 03/2002  . Carpal tunnel release  09/2004  . Breast surgery  08/08/11    lumpectomy    Family history:     Family History  Problem Relation Age of Onset  . Heart failure Mother   . Stroke Father   . Hypertension Other   the patient's father died suddenly at the age of 41, of unknown causes. The patient's mother died at the age of 15 from complications following a leg amputation. The patient had 2 brothers and 2 sisters. There is no history of breast or ovarian cancer in the immediate family  Gynecologic history: the patient had menarche age 33, last menstrual period 89. She took Prempro for many years, stopping only in December of 2012 with this diagnosis. The patient is GX P3, first pregnancy to term age 9.    Social history:  The patient's maiden name was [place]. Her family originally came from [place]. 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 Her 3 children are Daughter Gabrielle Warren age 31 works in [place] as a Comptroller. Son Gabrielle Warren 53 lives in [place] and is a retired Social worker. Son Gabrielle Warren") 54, lives in [place] and works in Film/video editor. The patient has no grandchildren. She is alone at home except for a CAT, but tells me that her daughter Gabrielle Warren lives nearby.     ADVANCED DIRECTIVES: yes  Health  maintenance:       History  Substance Use Topics  . Smoking status: Former Smoker    Quit date: 07/08/1994  . Smokeless tobacco: Never Used  . Alcohol Use: Yes      Colonoscopy: never  PAP: 2009  Bone density: 2012  Cholesterol:  Allergies:     Allergies  Allergen Reactions  . Fosamax (Alendronate Sodium) Other (See Comments)    Canker sores and bleeding gums    Medications:      Current Outpatient Prescriptions  Medication Sig Dispense Refill  . aspirin 81 MG tablet Take 81 mg by mouth daily.        . cholecalciferol (VITAMIN D) 1000 UNITS tablet 2 po qd  60 tablet  11  . diazepam (VALIUM) 10 MG tablet take 1/2 tab po tid prn  45 tablet  1  . diltiazem (CARDIZEM CD) 120 MG 24 hr capsule take 1 capsule by mouth once daily  30 capsule  PRN  . Ferrous Sulfate (IRON) 90 (18 FE) MG TABS Take 2 tablets by mouth daily.        . fish oil-omega-3 fatty acids 1000 MG capsule Take 2 g by mouth daily.        Marland Kitchen losartan (COZAAR) 100 MG tablet take 1 tablet by mouth once daily  90 tablet  1  . metoprolol succinate (TOPROL-XL) 25 MG 24 hr tablet Take 1 tablet (25 mg total) by mouth 2 (two) times daily.  180 tablet  3  . metoprolol tartrate (LOPRESSOR) 25 MG tablet Take 1 tablet (25 mg total) by mouth as needed.  90 tablet  3  . mometasone (NASONEX) 50 MCG/ACT nasal spray Place 2 sprays into the nose daily as needed.       . Multiple Vitamin (MULTIVITAMIN) tablet Take 1 tablet by mouth daily.        Marland Kitchen OVER THE COUNTER MEDICATION Calcium-vitamin D  1800-Vitamin D 1400 mg   1 Tab daily      . OVER THE COUNTER MEDICATION Glucosamine -chrondroitin  1,500mg  (BOTH)  Osteo Bi Flex       . prazosin (MINIPRESS) 1 MG capsule Take 2 caps by mouth every morning, 1 every evening and 2 at bedtime  360 capsule  1  . simvastatin (ZOCOR) 40 MG tablet Take 1 tablet (40 mg total) by mouth at bedtime.  90 tablet  3  . tamoxifen (NOLVADEX) 20 MG tablet Take 1 tablet (20 mg total) by mouth daily.  90 tablet  3   . traMADol (ULTRAM) 50 MG tablet Take 1 tablet (50 mg total) by mouth every 8 (eight) hours as needed for pain.  30 tablet  2  . Vitamin D, Ergocalciferol, (DRISDOL) 50000 UNITS CAPS take 1 capsule by mouth every week  4 capsule  5   No current facility-administered medications for this visit.    Physical exam:  Elderly white woman  in no acute distress    Filed Vitals:   01/18/13 1032  BP: 137/64  Pulse: 66  Temp: 98.4 F (36.9 C)  Resp: 20     Body mass index is 22.8 kg/(m^2).  ECOG PS: 0  Sclerae unicteric Oropharynx clear No cervical or supraclavicular adenopathy Lungs no rales or rhonchi Heart regular rate and rhythm Abd benign MSK no focal spinal tenderness, no peripheral edema Neuro: nonfocal Breasts: the right breast is status post lumpectomy. There is no evidence of local recurrence. The right axilla is benign. The left breast is unremarkable.   Lab results:      CBC No results found for this basename: WBC, HGB, HCT, PLT, MCV, MCH, MCHC, RDW, NEUTRABS, LYMPHSABS, MONOABS, EOSABS, BASOSABS, BANDABS, BANDSABD,  in the last 168 hours  Chemistries  No results found for this basename: NA, K, CL, CO2, GLUCOSE, BUN, CREATININE, CALCIUM, MG,  in the last 168 hours  Studies:    No results found.   Assessment: 79 y.o.  [place] woman status post right lumpectomy 08/08/2011 for a T1c NX invasive ductal carcinoma, grade 1, strongly estrogen and progesterone receptor positive, HER-2 negative, with an MIB-1 of less than 10.  Plan: She is tolerating tamoxifen well and there is no evidence of recurrent or progressive breast cancer. The plan is to continue tamoxifen to a total of 5 years and then release her from followup. She's can see [place] again late January, which will be just over 2 years from her original diagnosis and at that point we will start seeing her on a once a year basis. She knows to call for any problems that may develop before the next visit.   Dnaiel Voller  C 01/18/2013

## 2013-01-19 ENCOUNTER — Telehealth: Payer: Self-pay | Admitting: Oncology

## 2013-01-19 NOTE — Telephone Encounter (Signed)
S/w pt re appt for 1/20 and 08/03/2013

## 2013-02-02 ENCOUNTER — Other Ambulatory Visit: Payer: Self-pay | Admitting: Family Medicine

## 2013-03-30 ENCOUNTER — Encounter: Payer: Self-pay | Admitting: Family Medicine

## 2013-03-30 ENCOUNTER — Telehealth: Payer: Self-pay | Admitting: Family Medicine

## 2013-03-30 ENCOUNTER — Ambulatory Visit (INDEPENDENT_AMBULATORY_CARE_PROVIDER_SITE_OTHER): Payer: Medicare Other | Admitting: Family Medicine

## 2013-03-30 DIAGNOSIS — Z23 Encounter for immunization: Secondary | ICD-10-CM

## 2013-03-30 DIAGNOSIS — H6590 Unspecified nonsuppurative otitis media, unspecified ear: Secondary | ICD-10-CM

## 2013-03-30 MED ORDER — AZELASTINE-FLUTICASONE 137-50 MCG/ACT NA SUSP: 1.0000 | Freq: Two times a day (BID) | NASAL | Status: DC

## 2013-03-30 NOTE — Telephone Encounter (Signed)
Encounter closed due to protocol pt was scheduled an appt.

## 2013-03-30 NOTE — Progress Notes (Signed)
  Subjective:     Gabrielle Warren is a 79 y.o. female who presents for evaluation of dizziness. The symptoms started 1 day ago and are improved. The attacks occur frequently and last 1 minute. Positions that worsen symptoms: lying down, rolling in bed to the left and rolling in bed to the right. Previous workup/treatments: none. Associated ear symptoms: otalgia. Associated CNS symptoms: none. Recent infections: upper respiratory infection. Head trauma: denied. Drug ingestion: none. Noise exposure: no occupational exposure. Family history: non-contributory.  The following portions of the patient's history were reviewed and updated as appropriate: allergies, current medications, past family history, past medical history, past social history, past surgical history and problem list.  Review of Systems Pertinent items are noted in HPI.    Objective:    BP 124/72  Pulse 74  Temp(Src) 98.2 F (36.8 C) (Oral)  Wt 107 lb (48.535 kg)  BMI 23.15 kg/m2  SpO2 97% General appearance: alert, cooperative, appears stated age and no distress Head: Normocephalic, without obvious abnormality, atraumatic Eyes: conjunctivae/corneas clear. PERRL, EOM's intact. Fundi benign. Ears: normal TM's and external ear canals both ears Nose: Nares normal. Septum midline. Mucosa normal. No drainage or sinus tenderness. Throat: lips, mucosa, and tongue normal; teeth and gums normal Neck: no adenopathy, no carotid bruit, no JVD, supple, symmetrical, trachea midline and thyroid not enlarged, symmetric, no tenderness/mass/nodules Lungs: clear to auscultation bilaterally      Assessment:    Vertigo    With uri   Plan:    dymista nasal spray  Pt did not want to have steroid injection rto prn

## 2013-03-30 NOTE — Patient Instructions (Signed)

## 2013-03-30 NOTE — Telephone Encounter (Signed)
Patient Information:  Caller Name: Aliah  Phone: [phone]  Patient: Gabrielle Warren, Gabrielle Warren  Gender: Female  DOB: [birth year]  Age: 79 Years  PCP: Lelon Perla.  Office Follow Up:  Does the office need to follow up with this patient?: No  Instructions For The Office: N/A  RN Note:  Pt woke on 9-23 w/ the room spinning, didn't fall, but had to hold on to things to catch herself.  Pt denies dizziness at time of call.  Appt scheduled.   Symptoms  Reason For Call & Symptoms: Vertigo Flare up  Reviewed Health History In EMR: Yes  Reviewed Medications In EMR: Yes  Reviewed Allergies In EMR: Yes  Reviewed Surgeries / Procedures: Yes  Date of Onset of Symptoms: 03/30/2013  Guideline(s) Used:  Dizziness  Disposition Per Guideline:   See Today in Office  Reason For Disposition Reached:   Patient wants to be seen  Advice Given:  N/A  Patient Will Follow Care Advice:  YES  Appointment Scheduled:  03/30/2013 15:30:00 Appointment Scheduled Provider:  Lelon Perla.

## 2013-04-11 ENCOUNTER — Other Ambulatory Visit: Payer: Self-pay | Admitting: Family Medicine

## 2013-04-12 ENCOUNTER — Other Ambulatory Visit (INDEPENDENT_AMBULATORY_CARE_PROVIDER_SITE_OTHER): Payer: Self-pay | Admitting: Surgery

## 2013-04-12 DIAGNOSIS — Z853 Personal history of malignant neoplasm of breast: Secondary | ICD-10-CM

## 2013-04-12 NOTE — Telephone Encounter (Signed)
Last seen 03/30/13 and filled 01/07/13 #45 with 1 refill. Please advise     KP

## 2013-04-13 ENCOUNTER — Ambulatory Visit: Payer: Medicare Other | Admitting: Cardiology

## 2013-04-14 ENCOUNTER — Encounter (INDEPENDENT_AMBULATORY_CARE_PROVIDER_SITE_OTHER): Payer: Self-pay | Admitting: Surgery

## 2013-04-14 ENCOUNTER — Ambulatory Visit (INDEPENDENT_AMBULATORY_CARE_PROVIDER_SITE_OTHER): Payer: Medicare Other | Admitting: Surgery

## 2013-04-14 VITALS — BP 110/64 | HR 60 | Temp 97.3°F | Resp 16 | Ht 59.0 in | Wt 106.2 lb

## 2013-04-14 DIAGNOSIS — Z853 Personal history of malignant neoplasm of breast: Secondary | ICD-10-CM

## 2013-04-14 NOTE — Progress Notes (Signed)
Subjective:     Patient ID: Gabrielle Warren, female   DOB: 03-25-1926, 79 y.o.   MRN: [id number]  HPI She is here for long-term followup of her left breast cancer status post lumpectomy in January 2013. Her mammograms from this year in June were normal. She is doing well and without complaints. She remains on tamoxifen  Review of Systems     Objective:   Physical Exam On exam, there are no palpable breast masses and no axillary adenopathy on either side    Assessment:     Long-term followup right breast cancer     Plan:     She will continue her current care and I will see her back in one year

## 2013-04-22 ENCOUNTER — Other Ambulatory Visit: Payer: Self-pay

## 2013-04-22 MED ORDER — PRAZOSIN HCL 1 MG PO CAPS: ORAL_CAPSULE | ORAL | Status: DC

## 2013-05-11 ENCOUNTER — Other Ambulatory Visit: Payer: Self-pay | Admitting: Family Medicine

## 2013-05-24 ENCOUNTER — Telehealth: Payer: Self-pay

## 2013-05-24 NOTE — Telephone Encounter (Signed)
Call from patient and she stated that she got an EOB from her insurance company and the bill is for dates 10/08/12 and 03/30/13 and the provider was documented as [facility] instead of [facility]. She wanted to know if we were getting paid because the insurance company documented that the check was sent to [facility]. The patient would like to know if we could look into this.       KP

## 2013-05-26 NOTE — Telephone Encounter (Signed)
Can you handle this, you do very well with billing issues :)

## 2013-05-31 ENCOUNTER — Other Ambulatory Visit: Payer: Self-pay | Admitting: Cardiology

## 2013-06-04 NOTE — Telephone Encounter (Signed)
Reviewed chart with Harriett Sine and the information has been sent to the billing department for review. I have called the patient to make her aware but her VM is not set up.     KP

## 2013-06-07 ENCOUNTER — Telehealth: Payer: Self-pay | Admitting: *Deleted

## 2013-06-07 NOTE — Telephone Encounter (Signed)
Returned call to patient. She is going to her PCP on tomorrow, Dec.2 and will have labs drawn. We will coordinate her labs so she will not have to come on Jan.20,2015 for labs only. Her next appt with Dr. Darnelle Catalan is on Jan.27, 2015. Patient stated this would save her a trip having to come twice in one week.

## 2013-06-08 ENCOUNTER — Other Ambulatory Visit: Payer: Self-pay | Admitting: Family Medicine

## 2013-06-08 ENCOUNTER — Other Ambulatory Visit (INDEPENDENT_AMBULATORY_CARE_PROVIDER_SITE_OTHER): Payer: Medicare Other

## 2013-06-08 DIAGNOSIS — E785 Hyperlipidemia, unspecified: Secondary | ICD-10-CM

## 2013-06-08 DIAGNOSIS — C50919 Malignant neoplasm of unspecified site of unspecified female breast: Secondary | ICD-10-CM

## 2013-06-08 DIAGNOSIS — C50119 Malignant neoplasm of central portion of unspecified female breast: Secondary | ICD-10-CM

## 2013-06-08 LAB — CBC WITH DIFFERENTIAL/PLATELET
Basophils Absolute: 0 10*3/uL (ref 0.0–0.1)
Basophils Relative: 0.5 % (ref 0.0–3.0)
Eosinophils Absolute: 0.2 10*3/uL (ref 0.0–0.7)
Eosinophils Relative: 3.8 % (ref 0.0–5.0)
Hemoglobin: 10.8 g/dL — ABNORMAL LOW (ref 12.0–15.0)
Lymphocytes Relative: 31 % (ref 12.0–46.0)
MCHC: 33.8 g/dL (ref 30.0–36.0)
Monocytes Absolute: 0.7 10*3/uL (ref 0.1–1.0)
Monocytes Relative: 12.5 % — ABNORMAL HIGH (ref 3.0–12.0)
Neutrophils Relative %: 52.2 % (ref 43.0–77.0)
Platelets: 175 10*3/uL (ref 150.0–400.0)
RBC: 3.35 Mil/uL — ABNORMAL LOW (ref 3.87–5.11)
WBC: 5.8 10*3/uL (ref 4.5–10.5)

## 2013-06-08 LAB — HEPATIC FUNCTION PANEL
ALT: 22 U/L (ref 0–35)
Albumin: 3.5 g/dL (ref 3.5–5.2)
Total Bilirubin: 0.5 mg/dL (ref 0.3–1.2)

## 2013-06-08 LAB — COMPREHENSIVE METABOLIC PANEL
ALT: 22 U/L (ref 0–35)
AST: 23 U/L (ref 0–37)
Albumin: 3.5 g/dL (ref 3.5–5.2)
BUN: 10 mg/dL (ref 6–23)
CO2: 25 mEq/L (ref 19–32)
Calcium: 9.3 mg/dL (ref 8.4–10.5)
Chloride: 101 mEq/L (ref 96–112)
GFR: 69.44 mL/min (ref >60.00)
Potassium: 3.7 mEq/L (ref 3.5–5.1)
Total Bilirubin: 0.5 mg/dL (ref 0.3–1.2)

## 2013-06-08 LAB — LIPID PANEL
HDL: 61 mg/dL (ref >39.00)
Total CHOL/HDL Ratio: 2
VLDL: 13 mg/dL (ref 0.0–40.0)

## 2013-06-14 ENCOUNTER — Telehealth: Payer: Self-pay | Admitting: Internal Medicine

## 2013-06-15 ENCOUNTER — Encounter: Payer: Self-pay | Admitting: Cardiology

## 2013-06-15 ENCOUNTER — Ambulatory Visit (INDEPENDENT_AMBULATORY_CARE_PROVIDER_SITE_OTHER): Payer: Medicare Other | Admitting: Cardiology

## 2013-06-15 VITALS — BP 163/64 | HR 61 | Ht 59.0 in | Wt 108.8 lb

## 2013-06-15 DIAGNOSIS — I2510 Atherosclerotic heart disease of native coronary artery without angina pectoris: Secondary | ICD-10-CM

## 2013-06-15 NOTE — Progress Notes (Signed)
HPI The patient presents for routine followup. She continues to have palpitations which sound like isolated ectopic beats rather than sustained arrhythmias. She's had no presyncope or syncope. She's had no chest pressure, neck or arm discomfort. She's had no weight gain. She has had some trace lower extremity swelling. She denies any new shortness of breath, PND or orthopnea.    Allergies  Allergen Reactions  . Fosamax [Alendronate Sodium] Other (See Comments)    Canker sores and bleeding gums    Current Outpatient Prescriptions  Medication Sig Dispense Refill  . aspirin 81 MG tablet Take 81 mg by mouth daily.        . Azelastine-Fluticasone 137-50 MCG/ACT SUSP Place 1 spray into the nose 2 (two) times daily as needed.      . cholecalciferol (VITAMIN D) 1000 UNITS tablet 800 Units. 2 po qd with calcium 600 mg      . diazepam (VALIUM) 10 MG tablet take 1/2 tablet by mouth three times a day if needed  45 tablet  1  . diltiazem (CARDIZEM CD) 120 MG 24 hr capsule take 1 capsule by mouth once daily  30 capsule  PRN  . Ferrous Sulfate (IRON) 90 (18 FE) MG TABS Take 2 tablets by mouth daily.        . fish oil-omega-3 fatty acids 1000 MG capsule Take 2 g by mouth daily. 1200 mg      . losartan (COZAAR) 100 MG tablet take 1 tablet by mouth once daily  90 tablet  1  . metoprolol succinate (TOPROL-XL) 25 MG 24 hr tablet TAKE 1 TABLET TWICE A DAY  180 tablet  0  . Multiple Vitamin (MULTIVITAMIN) tablet Take 1 tablet by mouth daily.        . prazosin (MINIPRESS) 1 MG capsule Take 2 caps by mouth every morning, 1 every evening and 2 at bedtime  450 capsule  1  . simvastatin (ZOCOR) 40 MG tablet Take 1 tablet (40 mg total) by mouth at bedtime.  90 tablet  3  . tamoxifen (NOLVADEX) 20 MG tablet Take 1 tablet (20 mg total) by mouth daily.  90 tablet  3  . traMADol (ULTRAM) 50 MG tablet Take 1 tablet (50 mg total) by mouth every 8 (eight) hours as needed for pain.  30 tablet  2  . Vitamin D,  Ergocalciferol, (DRISDOL) 50000 UNITS CAPS Take 1 capsule (50,000 Units total) by mouth every 7 (seven) days.  4 capsule  5   No current facility-administered medications for this visit.    Past Medical History  Diagnosis Date  . HYPERTENSION 07/25/2006  . ANXIETY 09/23/2007  . HYPERLIPIDEMIA 07/03/2009  . Atrial tachycardia   . MVP (mitral valve prolapse)   . Arthritis   . Osteoarthritis   . Breast cancer   . Anemia 12/10/2011  . Osteoporosis     Past Surgical History  Procedure Laterality Date  . Catart extraction  04/2000, 03/2002  . Carpal tunnel release  09/2004  . Breast surgery  08/08/11    lumpectomy   ROS  As stated in the HPI and negative for all other systems.  PHYSICAL EXAM BP 163/64  Pulse 61  Ht 4\' 11"  (1.499 m)  Wt 108 lb 12.8 oz (49.351 kg)  BMI 21.96 kg/m2 GENERAL:  Well appearing NECK:  No jugular venous distention, waveform within normal limits, carotid upstroke brisk and symmetric, no bruits, no thyromegaly LUNGS:  Clear to auscultation bilaterally CHEST:  Unremarkable HEART:  PMI not  displaced or sustained,S1 and S2 within normal limits, no S3, no S4, no clicks, no rubs, no murmurs ABD:  Flat, positive bowel sounds normal in frequency in pitch, no bruits, no rebound, no guarding, no midline pulsatile mass, no hepatomegaly, no splenomegaly EXT:  2 plus pulses throughout, no edema, no cyanosis no clubbing  EKG:  Sinus rhythm, rate 61, axis within normal limits, intervals within normal limits, no acute ST-T wave changes. 06/15/2013   ASSESSMENT AND PLAN  HYPERTENSION -  Her blood pressure fluctuates slightly. However,it is on average at target. No change in therapy is indicated.  PALPITATIONS -  She has had no change in these.  No change in therapy is indicated.

## 2013-06-15 NOTE — Patient Instructions (Signed)
The current medical regimen is effective;  continue present plan and medications.  Follow up in 6 months with Dr Hochrein.  You will receive a letter in the mail 2 months before you are due.  Please call us when you receive this letter to schedule your follow up appointment.  

## 2013-06-16 ENCOUNTER — Ambulatory Visit
Admission: RE | Admit: 2013-06-16 | Discharge: 2013-06-16 | Disposition: A | Discharge status: Home / Self Care | Payer: Medicare Other | Source: Ambulatory Visit | Attending: Surgery | Admitting: Surgery

## 2013-06-16 ENCOUNTER — Other Ambulatory Visit (INDEPENDENT_AMBULATORY_CARE_PROVIDER_SITE_OTHER): Payer: Self-pay | Admitting: Surgery

## 2013-06-16 DIAGNOSIS — Z853 Personal history of malignant neoplasm of breast: Secondary | ICD-10-CM

## 2013-07-08 ENCOUNTER — Other Ambulatory Visit: Payer: Self-pay | Admitting: Family Medicine

## 2013-07-09 NOTE — Telephone Encounter (Signed)
Last seen 03/30/13 and filled 04/11/13 #45 with 1 refill. Please advise      KP

## 2013-07-12 ENCOUNTER — Other Ambulatory Visit: Payer: Self-pay | Admitting: *Deleted

## 2013-07-13 ENCOUNTER — Telehealth: Payer: Self-pay | Admitting: Physician Assistant

## 2013-07-13 NOTE — Telephone Encounter (Signed)
SW pt adv per [facility] canc Jan appt and rs for lab and OV in Feb Cal mailed shh

## 2013-07-16 ENCOUNTER — Ambulatory Visit: Payer: Medicare Other | Admitting: Cardiology

## 2013-07-22 ENCOUNTER — Telehealth: Payer: Self-pay | Admitting: *Deleted

## 2013-07-22 ENCOUNTER — Ambulatory Visit (INDEPENDENT_AMBULATORY_CARE_PROVIDER_SITE_OTHER): Payer: Medicare Other | Admitting: Family Medicine

## 2013-07-22 ENCOUNTER — Encounter: Payer: Self-pay | Admitting: Family Medicine

## 2013-07-22 VITALS — BP 130/70 | HR 56 | Temp 98.3°F | Resp 16 | Wt 105.1 lb

## 2013-07-22 DIAGNOSIS — R059 Cough, unspecified: Secondary | ICD-10-CM

## 2013-07-22 DIAGNOSIS — R300 Dysuria: Secondary | ICD-10-CM

## 2013-07-22 DIAGNOSIS — R350 Frequency of micturition: Secondary | ICD-10-CM

## 2013-07-22 DIAGNOSIS — R05 Cough: Secondary | ICD-10-CM

## 2013-07-22 MED ORDER — BENZONATATE 200 MG PO CAPS: 200.0000 mg | ORAL_CAPSULE | Freq: Three times a day (TID) | ORAL | Status: DC | PRN

## 2013-07-22 NOTE — Telephone Encounter (Signed)
Delsym OTC as needed for cough

## 2013-07-22 NOTE — Progress Notes (Signed)
   Subjective:    Patient ID: Gabrielle Warren, female    DOB: [birth year], 80 y.o.   MRN: [id number]  HPI Pt reports she came down w/ the flu on 1/7- 'i had a little irritation in the left tonsil area with a slight headache'.  1/8 had fever to 101.  Some body aches.  + cough- 'my pelvis was so sore from coughing'.  Cough was not productive, started Mucinex w/ some improvement.  'i think i'm feeling better'.  Cough is improving.  No longer having fevers.  Yesterday had dysuria.  Unable to give urine sample today after an hour of trying.  No frequency or urgency.   Review of Systems For ROS see HPI     Objective:   Physical Exam  Vitals reviewed. Constitutional: She is oriented to person, place, and time. She appears well-developed and well-nourished. No distress.  HENT:  Head: Normocephalic and atraumatic.  Neck: Normal range of motion. Neck supple.  Pulmonary/Chest: Effort normal and breath sounds normal. No respiratory distress. She has no wheezes. She has no rales.  Abdominal: Soft. She exhibits no distension. There is no tenderness (no suprapubic or CVA tenderness).  Lymphadenopathy:    She has no cervical adenopathy.  Neurological: She is alert and oriented to person, place, and time.  Skin: Skin is warm and dry.          Assessment & Plan:

## 2013-07-22 NOTE — Telephone Encounter (Signed)
Pharmacy and pt notified

## 2013-07-22 NOTE — Progress Notes (Signed)
Pre visit review using our clinic review tool, if applicable. No additional management support is needed unless otherwise documented below in the visit note. 

## 2013-07-22 NOTE — Patient Instructions (Signed)
Follow up as needed Bring a urine sample at your convenience Use the cough pills as needed Drink plenty of fluids Call with any questions or concerns Hang in there!!

## 2013-07-22 NOTE — Telephone Encounter (Signed)
[facility] called and stated that they do not have benzonatate (TESSALON) 200 MG capsule its on a nation wide back order.

## 2013-07-25 NOTE — Assessment & Plan Note (Signed)
Pt unable to provide sample in office today.  Has hx of similar.  Pt given container to bring sample back for UA and cx.  Pt expressed understanding and is in agreement w/ plan.

## 2013-07-25 NOTE — Assessment & Plan Note (Signed)
Pt appears to have had influenza like illness- this is resolving.  Continues to have cough.  Reviewed supportive care.  Pt expressed understanding and is in agreement w/ plan.

## 2013-07-26 ENCOUNTER — Other Ambulatory Visit (INDEPENDENT_AMBULATORY_CARE_PROVIDER_SITE_OTHER): Payer: Medicare Other

## 2013-07-26 DIAGNOSIS — N390 Urinary tract infection, site not specified: Secondary | ICD-10-CM

## 2013-07-26 LAB — POCT URINALYSIS DIPSTICK
Bilirubin, UA: NEGATIVE
GLUCOSE UA: NEGATIVE
Ketones, UA: NEGATIVE
Nitrite, UA: POSITIVE
PROTEIN UA: NEGATIVE
Urobilinogen, UA: 0.2
pH, UA: 8

## 2013-07-27 ENCOUNTER — Other Ambulatory Visit: Payer: Medicare Other

## 2013-07-28 ENCOUNTER — Other Ambulatory Visit: Payer: Self-pay

## 2013-07-28 LAB — URINE CULTURE

## 2013-07-28 MED ORDER — CIPROFLOXACIN HCL 500 MG PO TABS: 500.0000 mg | ORAL_TABLET | Freq: Two times a day (BID) | ORAL | Status: DC

## 2013-08-03 ENCOUNTER — Ambulatory Visit: Payer: Medicare Other | Admitting: Physician Assistant

## 2013-08-09 ENCOUNTER — Other Ambulatory Visit: Payer: Self-pay | Admitting: Family Medicine

## 2013-08-20 ENCOUNTER — Other Ambulatory Visit: Payer: Self-pay | Admitting: Cardiology

## 2013-08-20 ENCOUNTER — Other Ambulatory Visit: Payer: Self-pay | Admitting: *Deleted

## 2013-08-20 DIAGNOSIS — C50911 Malignant neoplasm of unspecified site of right female breast: Secondary | ICD-10-CM

## 2013-08-20 MED ORDER — TAMOXIFEN CITRATE 20 MG PO TABS: 20.0000 mg | ORAL_TABLET | Freq: Every day | ORAL | Status: DC

## 2013-08-30 ENCOUNTER — Other Ambulatory Visit: Payer: Self-pay | Admitting: Physician Assistant

## 2013-08-30 DIAGNOSIS — C50219 Malignant neoplasm of upper-inner quadrant of unspecified female breast: Secondary | ICD-10-CM

## 2013-08-31 ENCOUNTER — Encounter: Payer: Self-pay | Admitting: Physician Assistant

## 2013-08-31 ENCOUNTER — Ambulatory Visit: Payer: Medicare Other | Admitting: Physician Assistant

## 2013-08-31 ENCOUNTER — Other Ambulatory Visit: Payer: Medicare Other

## 2013-08-31 NOTE — Progress Notes (Signed)
[facility] today, likely due to the inclement weather.  Letter mailed to patient.   Minard Millirons, PA-C 08/31/2013 

## 2013-09-01 ENCOUNTER — Telehealth: Payer: Self-pay | Admitting: Oncology

## 2013-09-01 NOTE — Telephone Encounter (Signed)
Sent letter to patient from Dr. Magrinat. °

## 2013-09-02 ENCOUNTER — Other Ambulatory Visit: Payer: Self-pay | Admitting: *Deleted

## 2013-09-02 ENCOUNTER — Telehealth: Payer: Self-pay | Admitting: Oncology

## 2013-09-02 NOTE — Telephone Encounter (Signed)
pt called to r/s missed appt...done....pt aware of new d.t °

## 2013-09-02 NOTE — Telephone Encounter (Signed)
pt called to r/s missed appt...done....pt said she had lab already...per Val cx lab...done

## 2013-09-04 ENCOUNTER — Other Ambulatory Visit: Payer: Self-pay | Admitting: Family Medicine

## 2013-09-22 ENCOUNTER — Encounter: Payer: Self-pay | Admitting: Physician Assistant

## 2013-09-22 ENCOUNTER — Other Ambulatory Visit: Payer: Medicare Other

## 2013-09-22 ENCOUNTER — Ambulatory Visit (HOSPITAL_BASED_OUTPATIENT_CLINIC_OR_DEPARTMENT_OTHER): Payer: Medicare Other | Admitting: Physician Assistant

## 2013-09-22 ENCOUNTER — Telehealth: Payer: Self-pay | Admitting: *Deleted

## 2013-09-22 VITALS — BP 155/63 | HR 60 | Temp 97.9°F | Resp 18 | Ht 59.0 in | Wt 102.3 lb

## 2013-09-22 DIAGNOSIS — C50911 Malignant neoplasm of unspecified site of right female breast: Secondary | ICD-10-CM

## 2013-09-22 DIAGNOSIS — D62 Acute posthemorrhagic anemia: Secondary | ICD-10-CM | POA: Insufficient documentation

## 2013-09-22 DIAGNOSIS — D649 Anemia, unspecified: Secondary | ICD-10-CM

## 2013-09-22 DIAGNOSIS — Z170 Estrogen receptor positive status [ER+]: Secondary | ICD-10-CM

## 2013-09-22 DIAGNOSIS — C50919 Malignant neoplasm of unspecified site of unspecified female breast: Secondary | ICD-10-CM

## 2013-09-22 DIAGNOSIS — C50219 Malignant neoplasm of upper-inner quadrant of unspecified female breast: Secondary | ICD-10-CM

## 2013-09-22 DIAGNOSIS — Z853 Personal history of malignant neoplasm of breast: Secondary | ICD-10-CM

## 2013-09-22 MED ORDER — TAMOXIFEN CITRATE 20 MG PO TABS: 20.0000 mg | ORAL_TABLET | Freq: Every day | ORAL | Status: DC

## 2013-09-22 NOTE — Telephone Encounter (Signed)
appts made and printed. Pt stated that she will schedule her own mammo....td

## 2013-09-22 NOTE — Progress Notes (Signed)
Gabrielle Warren  DOB: 01-15-1926  MR#: [id number]  CSN#: [id number]    PCP: Garnet Koyanagi, DO GYN: Cheri Fowler, MD SU: Coralie Keens, MD OTHER MD: Minus Breeding, MD  CHIEF COMPLAINT:  Hx of Right Breast Cancer                 History of present illness:   Gabrielle Warren is an 80 year old [place] woman who on screening mammography at [facility] was found to have a suspicious mass. I do not have those records, but additional views confirmed the abnormality andb iopsy was attempted 26/83/4196, complicated by bleeding. The patient was then referred to the breast Center whereultrasound-guided biopsy was performed 07/05/2011. This showed an invasive ductal carcinoma, grade 1, HER-2 negative, 100% estrogen receptor positive, 86% progesterone receptor positive, with an MIB-1 of 8%.the patient was then referred for bilateral breast MRIs which showed a 9 mm spiculated enhancing mass in the middle third of the right breast. There was an associated clip artifact. In the contralateral breast, a 5 mm mass was found which was irregular and nodular, with washout genetics. Biopsy of the second mass under MRI guidance (it could not be visualized under ultrasound) is scheduled for January 10.  Her subsequent history is as detailed below  Interval history:  Gabrielle Warren returns alone today for followup of her right breast cancer. She has now been on tamoxifen for a full 2 years, and is tolerating the medication well. She has no significant hot flashes. She denies any vaginal dryness, discharge, or bleeding.   Her energy level is fair. She does some of her own housework, and tries to walk daily. She is planning to move in with her daughter Gabrielle Warren who lives in [place] as soon as she can sell her own home.   Review of Systems: Gabrielle Warren had the "flu" back in January with fever and chills, but has had no additional illnesses, and no fever since that time. As noted above, she denies any hot flashes.  She's had no skin changes or rashes and denies any abnormal bruising or bleeding. Her appetite is good, and she denies any nausea, emesis or change in bowel or bladder habits. She has a pessary in place to prevent urinary incontinence and finds this very helpful. She has some seasonal allergies with a dry cough, but denies any phlegm production, pleurisy, or chest pain. She occasionally has some palpitations which are stable. She denies any abnormal headaches or dizziness. She has regular eye exams and has already had cataracts removed. She has some general arthritic pain, but this is stable and she denies any new or unusual myalgias, arthralgias, bony pain, or peripheral swelling otherwise.  A detailed review of systems is otherwise stable and noncontributory.    Past medical history:      Past Medical History  Diagnosis Date  . HYPERTENSION 07/25/2006  . ANXIETY 09/23/2007  . HYPERLIPIDEMIA 07/03/2009  . Atrial tachycardia   . MVP (mitral valve prolapse)   . Arthritis   . Osteoarthritis   . Breast cancer   . Anemia 12/10/2011  . Osteoporosis   coronary artery disease, status post remote heart attack (2229)  Past surgical history:      Past Surgical History  Procedure Laterality Date  . Catart extraction  04/2000, 03/2002  . Carpal tunnel release  09/2004  . Breast surgery  08/08/11    lumpectomy    Family history:     Family History  Problem Relation Age of Onset  . Heart  failure Mother   . Stroke Father   . Hypertension Other   the patient's father died suddenly at the age of 69, of unknown causes. The patient's mother died at the age of 48 from complications following a leg amputation. The patient had 2 brothers and 2 sisters. There is no history of breast or ovarian cancer in the immediate family  Gynecologic history:    The patient had menarche age 36, last menstrual period 82. She took Prempro for many years, stopping only in December of 2012 with this diagnosis. The  patient is GX P3, first pregnancy to term age 65.    Social history:  (Updated 09/22/2013) The patient's maiden name was [place]. Her family originally came from [place]. 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 Her 3 children are Daughter Gabrielle Warren age 2 works in [place] as a Licensed conveyancer. Son Gabrielle Warren 64 lives in [place] and is a retired Scientist, research (medical). Son Gabrielle Warren") 54, lives in [place] and works in Nutritional therapist. The patient has no grandchildren. She is alone at home except for a cat, but tells me that her daughter Gabrielle Warren lives nearby.  As of March 2015, she is trying to sell her home, and plans to move in with [place].    ADVANCED DIRECTIVES: yes  Health maintenance:      (Updated 09/22/2013) History  Substance Use Topics  . Smoking status: Former Smoker    Quit date: 07/08/1994  . Smokeless tobacco: Never Used  . Alcohol Use: Yes      Colonoscopy: never  PAP: UTD/[facility]  Bone density: Dec 2011, severe osteopenia with T score of -2.4 ([facility])  Cholesterol: Dec 2014/[facility]  Allergies:     Allergies  Allergen Reactions  . Fosamax [Alendronate Sodium] Other (See Comments)    Canker sores and bleeding gums    Medications:      Current Outpatient Prescriptions  Medication Sig Dispense Refill  . aspirin 81 MG tablet Take 81 mg by mouth daily.        . cholecalciferol (VITAMIN D) 1000 UNITS tablet 800 Units. 2 po qd with calcium 600 mg      . diazepam (VALIUM) 10 MG tablet take 1/2 tablet by mouth three times a day if needed  45 tablet  1  . diltiazem (CARDIZEM CD) 120 MG 24 hr capsule take 1 capsule by mouth once daily  30 capsule  5  . Ferrous Sulfate (IRON) 90 (18 FE) MG TABS Take 2 tablets by mouth daily.        . fish oil-omega-3 fatty acids 1000 MG capsule Take 2 g by mouth daily. 1200 mg      . losartan (COZAAR) 100 MG tablet take 1 tablet by mouth once daily  90 tablet  1  . metoprolol succinate (TOPROL-XL) 25 MG 24 hr  tablet TAKE 1 TABLET TWICE A DAY  180 tablet  0  . Multiple Vitamin (MULTIVITAMIN) tablet Take 1 tablet by mouth daily.        . prazosin (MINIPRESS) 1 MG capsule Take 2 caps by mouth every morning, 1 every evening and 2 at bedtime  450 capsule  1  . simvastatin (ZOCOR) 40 MG tablet take 1 tablet by mouth at bedtime  90 tablet  1  . tamoxifen (NOLVADEX) 20 MG tablet Take 1 tablet (20 mg total) by mouth daily.  90 tablet  3  . Vitamin D, Ergocalciferol, (DRISDOL) 50000 UNITS CAPS capsule take 1 capsule by  mouth every week  4 capsule  1  . traMADol (ULTRAM) 50 MG tablet Take 1 tablet (50 mg total) by mouth every 8 (eight) hours as needed for pain.  30 tablet  2   No current facility-administered medications for this visit.    Physical exam:  Elderly white woman who appears comfortable and is in no acute distress    Filed Vitals:   09/22/13 1219  BP: 155/63  Warren: 60  Temp: 97.9 F (36.6 C)  Resp: 18     Body mass index is 20.65 kg/(m^2).  ECOG PS: 0 Filed Weights   09/22/13 1219  Weight: 102 lb 4.8 oz (46.403 kg)   Physical Exam: HEENT:  Sclerae anicteric.  Conjunctiva pink. Oropharynx clear and moist. Neck supple, trachea midline. NODES:  No cervical or supraclavicular lymphadenopathy palpated.  BREAST EXAM:  Right breast is status post lumpectomy. No suspicious nodularities or skin changes, no evidence of local recurrence. Left breast is unremarkable. Axillae are benign bilaterally, with no palpable lymphadenopathy. LUNGS:  Clear to auscultation bilaterally with good excursion.  No wheezes or rhonchi HEART:  Regular rate and rhythm. No murmur appreciated ABDOMEN:  Soft, thin, nontender. No hepatomegaly. No palpable masses.  Positive bowel sounds.  MSK:  No focal spinal tenderness to palpation. Good range of motion in the upper and lower extremities. EXTREMITIES:  No peripheral edema.  No lymphedema in the right upper extremity. SKIN:  Benign with no visible rashes or skin lesions.  No excessive ecchymoses. No petechiae. No pallor. NEURO:  Nonfocal. Well oriented.  Appropriate affect.   Lab results:     The patient declined having labs drawn through our office today. She did provide [place] with copies of labs drawn in December 2014 including her lipid panel and her hepatic function panel. Liver enzymes were normal with the exception of an alkaline phosphatase which was slightly low at 30.  CBC    Component Value Date/Time   WBC 5.8 06/08/2013 1418   WBC 9.0 01/11/2013 1006   RBC 3.35* 06/08/2013 1418   RBC 3.44* 06/08/2013 1418   RBC 3.41* 01/11/2013 1006   HGB 10.8* 06/08/2013 1418   HGB 11.1* 01/11/2013 1006   HCT 31.9* 06/08/2013 1418   HCT 31.7* 01/11/2013 1006   PLT 175.0 06/08/2013 1418   PLT 195 01/11/2013 1006   MCV 95.1 06/08/2013 1418   MCV 93.1 01/11/2013 1006   MCH 32.7 01/11/2013 1006   MCHC 33.8 06/08/2013 1418   MCHC 35.1 01/11/2013 1006   RDW 12.4 06/08/2013 1418   RDW 12.3 01/11/2013 1006   LYMPHSABS 1.8 06/08/2013 1418   LYMPHSABS 2.0 01/11/2013 1006   MONOABS 0.7 06/08/2013 1418   MONOABS 0.8 01/11/2013 1006   EOSABS 0.2 06/08/2013 1418   EOSABS 0.2 01/11/2013 1006   BASOSABS 0.0 06/08/2013 1418   BASOSABS 0.1 01/11/2013 1006    CMP     Component Value Date/Time   NA 134* 06/08/2013 1418   NA 134* 01/11/2013 1006   K 3.7 06/08/2013 1418   K 4.2 01/11/2013 1006   CL 101 06/08/2013 1418   CO2 25 06/08/2013 1418   CO2 26 01/11/2013 1006   GLUCOSE 99 06/08/2013 1418   GLUCOSE 130 01/11/2013 1006   GLUCOSE 124* 06/18/2006 0958   BUN 10 06/08/2013 1418   BUN 9.3 01/11/2013 1006   CREATININE 0.8 06/08/2013 1418   CREATININE 0.8 01/11/2013 1006   CALCIUM 9.3 06/08/2013 1418   CALCIUM 9.0 01/11/2013 1006   PROT  6.5 06/08/2013 1418   PROT 6.5 06/08/2013 1418   PROT 6.5 01/11/2013 1006   ALBUMIN 3.5 06/08/2013 1418   ALBUMIN 3.5 06/08/2013 1418   ALBUMIN 3.2* 01/11/2013 1006   AST 23 06/08/2013 1418   AST 23 06/08/2013 1418   AST 19 01/11/2013 1006   ALT 22 06/08/2013 1418   ALT 22 06/08/2013 1418    ALT 19 01/11/2013 1006   ALKPHOS 30* 06/08/2013 1418   ALKPHOS 30* 06/08/2013 1418   ALKPHOS 39* 01/11/2013 1006   BILITOT 0.5 06/08/2013 1418   BILITOT 0.5 06/08/2013 1418   BILITOT 0.32 01/11/2013 1006   GFRNONAA 77* 08/06/2011 1400   GFRAA 90* 08/06/2011 1400       Studies:     Most recent bilateral mammogram and right breast ultrasound in 06/16/2013 was unremarkable.    Assessment: 80 y.o.  [place] woman   (1)  status post right lumpectomy 08/08/2011 for a T1c NX invasive ductal carcinoma, grade 1, strongly estrogen and progesterone receptor positive, HER-2 negative, with an MIB-1 of less than 10.  (2)  no adjuvant radiation or chemotherapy.  (3)  started on tamoxifen in late January 2013, the plan being to continue for total of 5 years (until January 2018).  (4) anemia, chronic and stable  Plan:  Gabrielle Warren appears to be doing very well with regards to her breast cancer, and there is no clinical evidence of disease recurrence at this time. She's tolerating the tamoxifen well and we'll continue as planned.  I'm making no changes in her regimen, and I have refilled the tamoxifen for another year.   The patient will have her next annual mammogram in December of this year, and will return to see [place] in late January 2016 for repeat labs and physical exam. We will continue to see her on an annual basis until she completes 5 years of antiestrogen therapy. In the meanwhile, she knows to call with any changes or problems. She voices her understanding and agreement with the above plan.   Exavior Kimmons PA-C 09/22/2013

## 2013-10-05 ENCOUNTER — Other Ambulatory Visit: Payer: Self-pay | Admitting: Family Medicine

## 2013-10-05 ENCOUNTER — Telehealth: Payer: Self-pay

## 2013-10-05 DIAGNOSIS — I10 Essential (primary) hypertension: Secondary | ICD-10-CM

## 2013-10-05 DIAGNOSIS — E785 Hyperlipidemia, unspecified: Secondary | ICD-10-CM

## 2013-10-05 NOTE — Telephone Encounter (Signed)
Spoke with patient and she is requesting her 6 mo labs. Please advise what you would like for her to have done since she has future orders from specialist. She also stated she did not want to do another Toxicology test. Please advise      KP

## 2013-10-06 NOTE — Telephone Encounter (Signed)
No UDS required

## 2013-10-06 NOTE — Telephone Encounter (Signed)
Orders in-- as far as UDS---  It is required with any controlled substance taking regularly that at least 1 a year be done--- why is she so against it--she doesn't pay anything.

## 2013-10-06 NOTE — Telephone Encounter (Signed)
Patient aware and voiced understanding. Sticky note left in the chart.        KP

## 2013-10-06 NOTE — Telephone Encounter (Signed)
Patient stated that Medicare will not pay for the test. She is only taking the medication prn and does not get it filled often.     KP

## 2013-10-07 ENCOUNTER — Other Ambulatory Visit: Payer: Self-pay | Admitting: Family Medicine

## 2013-10-07 NOTE — Telephone Encounter (Signed)
Ok for #45, no refills 

## 2013-10-07 NOTE — Telephone Encounter (Signed)
Last seen 03/30/13 and filled 07/08/13 #45 with 1 refill  Patient does not have to do UDS per provider   Please advise      KP

## 2013-10-07 NOTE — Telephone Encounter (Signed)
Rx faxed.    KP 

## 2013-10-20 ENCOUNTER — Other Ambulatory Visit: Payer: Self-pay | Admitting: Family Medicine

## 2013-10-31 ENCOUNTER — Other Ambulatory Visit: Payer: Self-pay | Admitting: Family Medicine

## 2013-11-05 ENCOUNTER — Other Ambulatory Visit: Payer: Self-pay | Admitting: Family Medicine

## 2013-11-15 ENCOUNTER — Other Ambulatory Visit: Payer: Self-pay | Admitting: *Deleted

## 2013-11-24 ENCOUNTER — Other Ambulatory Visit: Payer: Self-pay | Admitting: Family Medicine

## 2013-11-24 NOTE — Telephone Encounter (Signed)
Last seen 03/30/13 and filled 10/07/13 #45. Please advise     KP

## 2013-12-03 ENCOUNTER — Encounter: Payer: Self-pay | Admitting: Cardiology

## 2013-12-05 ENCOUNTER — Other Ambulatory Visit: Payer: Self-pay | Admitting: Family Medicine

## 2013-12-09 ENCOUNTER — Encounter: Payer: Self-pay | Admitting: Cardiology

## 2013-12-14 ENCOUNTER — Other Ambulatory Visit (INDEPENDENT_AMBULATORY_CARE_PROVIDER_SITE_OTHER): Payer: Medicare Other

## 2013-12-14 DIAGNOSIS — I10 Essential (primary) hypertension: Secondary | ICD-10-CM

## 2013-12-14 DIAGNOSIS — C50919 Malignant neoplasm of unspecified site of unspecified female breast: Secondary | ICD-10-CM

## 2013-12-14 DIAGNOSIS — E785 Hyperlipidemia, unspecified: Secondary | ICD-10-CM

## 2013-12-14 LAB — HEPATIC FUNCTION PANEL
ALT: 22 U/L (ref 0–35)
AST: 24 U/L (ref 0–37)
Albumin: 3.5 g/dL (ref 3.5–5.2)
Alkaline Phosphatase: 33 U/L — ABNORMAL LOW (ref 39–117)
Bilirubin, Direct: 0.1 mg/dL (ref 0.0–0.3)
Total Bilirubin: 0.6 mg/dL (ref 0.2–1.2)
Total Protein: 6.5 g/dL (ref 6.0–8.3)

## 2013-12-14 LAB — CBC WITH DIFFERENTIAL/PLATELET
Basophils Absolute: 0 10*3/uL (ref 0.0–0.1)
Basophils Relative: 0.6 % (ref 0.0–3.0)
Eosinophils Absolute: 0.3 10*3/uL (ref 0.0–0.7)
Eosinophils Relative: 4.5 % (ref 0.0–5.0)
HCT: 34.1 % — ABNORMAL LOW (ref 36.0–46.0)
Hemoglobin: 11.4 g/dL — ABNORMAL LOW (ref 12.0–15.0)
Lymphocytes Relative: 31.8 % (ref 12.0–46.0)
Lymphs Abs: 1.9 10*3/uL (ref 0.7–4.0)
MCHC: 33.5 g/dL (ref 30.0–36.0)
MCV: 95.7 fl (ref 78.0–100.0)
Monocytes Absolute: 0.7 10*3/uL (ref 0.1–1.0)
Monocytes Relative: 11.2 % (ref 3.0–12.0)
Neutro Abs: 3.2 10*3/uL (ref 1.4–7.7)
Neutrophils Relative %: 51.9 % (ref 43.0–77.0)
Platelets: 181 10*3/uL (ref 150.0–400.0)
RBC: 3.57 Mil/uL — ABNORMAL LOW (ref 3.87–5.11)
RDW: 12.7 % (ref 11.5–15.5)
WBC: 6.1 10*3/uL (ref 4.0–10.5)

## 2013-12-14 LAB — LIPID PANEL
Cholesterol: 131 mg/dL (ref 0–200)
HDL: 67.1 mg/dL (ref >39.00)
LDL Cholesterol: 44 mg/dL (ref 0–99)
NonHDL: 63.9
TRIGLYCERIDES: 99 mg/dL (ref 0.0–149.0)
Total CHOL/HDL Ratio: 2
VLDL: 19.8 mg/dL (ref 0.0–40.0)

## 2013-12-14 LAB — BASIC METABOLIC PANEL WITH GFR
BUN: 11 mg/dL (ref 6–23)
CO2: 28 meq/L (ref 19–32)
Calcium: 9.2 mg/dL (ref 8.4–10.5)
Chloride: 101 meq/L (ref 96–112)
Creatinine, Ser: 0.8 mg/dL (ref 0.4–1.2)
GFR: 69.35 mL/min
Glucose, Bld: 101 mg/dL — ABNORMAL HIGH (ref 70–99)
Potassium: 3.6 meq/L (ref 3.5–5.1)
Sodium: 135 meq/L (ref 135–145)

## 2013-12-15 LAB — COMPLETE METABOLIC PANEL WITH GFR
ALBUMIN: 3.7 g/dL (ref 3.5–5.2)
ALK PHOS: 34 U/L — AB (ref 39–117)
ALT: 20 U/L (ref 0–35)
AST: 20 U/L (ref 0–37)
BUN: 10 mg/dL (ref 6–23)
CALCIUM: 8.9 mg/dL (ref 8.4–10.5)
CHLORIDE: 99 meq/L (ref 96–112)
CO2: 25 mEq/L (ref 19–32)
Creat: 0.82 mg/dL (ref 0.50–1.10)
GFR, Est African American: 75 mL/min
GFR, Est Non African American: 65 mL/min
Glucose, Bld: 107 mg/dL — ABNORMAL HIGH (ref 70–99)
POTASSIUM: 3.9 meq/L (ref 3.5–5.3)
SODIUM: 135 meq/L (ref 135–145)
TOTAL PROTEIN: 6.3 g/dL (ref 6.0–8.3)
Total Bilirubin: 0.5 mg/dL (ref 0.2–1.2)

## 2013-12-17 ENCOUNTER — Telehealth: Payer: Self-pay

## 2013-12-17 DIAGNOSIS — D649 Anemia, unspecified: Secondary | ICD-10-CM

## 2013-12-17 DIAGNOSIS — E785 Hyperlipidemia, unspecified: Secondary | ICD-10-CM

## 2013-12-17 NOTE — Telephone Encounter (Signed)
Future orders entered. 

## 2013-12-21 ENCOUNTER — Ambulatory Visit: Payer: Medicare Other | Admitting: Cardiology

## 2014-01-03 ENCOUNTER — Other Ambulatory Visit: Payer: Self-pay | Admitting: Family Medicine

## 2014-01-05 ENCOUNTER — Other Ambulatory Visit: Payer: Self-pay | Admitting: Family Medicine

## 2014-01-05 NOTE — Telephone Encounter (Signed)
Last seen 03/30/13 and 11/24/13 #45. Please advise      KP

## 2014-01-21 ENCOUNTER — Other Ambulatory Visit: Payer: Self-pay | Admitting: *Deleted

## 2014-01-21 ENCOUNTER — Ambulatory Visit (INDEPENDENT_AMBULATORY_CARE_PROVIDER_SITE_OTHER): Payer: Medicare Other | Admitting: Cardiology

## 2014-01-21 ENCOUNTER — Encounter: Payer: Self-pay | Admitting: Cardiology

## 2014-01-21 VITALS — BP 160/55 | HR 51 | Ht 59.0 in | Wt 102.0 lb

## 2014-01-21 DIAGNOSIS — I10 Essential (primary) hypertension: Secondary | ICD-10-CM

## 2014-01-21 DIAGNOSIS — D649 Anemia, unspecified: Secondary | ICD-10-CM

## 2014-01-21 DIAGNOSIS — Z853 Personal history of malignant neoplasm of breast: Secondary | ICD-10-CM

## 2014-01-21 DIAGNOSIS — E785 Hyperlipidemia, unspecified: Secondary | ICD-10-CM

## 2014-01-21 DIAGNOSIS — I2510 Atherosclerotic heart disease of native coronary artery without angina pectoris: Secondary | ICD-10-CM

## 2014-01-21 MED ORDER — SIMVASTATIN 20 MG PO TABS: 20.0000 mg | ORAL_TABLET | Freq: Every day | ORAL | Status: DC

## 2014-01-21 MED ORDER — METOPROLOL SUCCINATE ER 25 MG PO TB24: 25.0000 mg | ORAL_TABLET | Freq: Two times a day (BID) | ORAL | Status: DC

## 2014-01-21 NOTE — Patient Instructions (Signed)
Your physician recommends that you schedule a follow-up appointment in: one year with Dr. Hochrein  

## 2014-01-21 NOTE — Progress Notes (Signed)
HPI The patient presents for routine followup. She is not describing any palpitatoins.  She's had no presyncope or syncope. She's had no chest pressure, neck or arm discomfort. She's had no weight gain. She denies any new shortness of breath, PND or orthopnea.  She keeps an excellent blood pressure diary and her readings have been well controlled.   Allergies  Allergen Reactions  . Fosamax [Alendronate Sodium] Other (See Comments)    Canker sores and bleeding gums    Current Outpatient Prescriptions  Medication Sig Dispense Refill  . aspirin 81 MG tablet Take 81 mg by mouth daily.        . Calcium Carbonate-Vit D-Min (CALCIUM 1200 PO) Take 1,200 Units by mouth 2 (two) times daily.      . cholecalciferol (VITAMIN D) 1000 UNITS tablet 800 Units. 2 po qd with calcium 600 mg      . Cholecalciferol (VITAMIN D-3) 1000 UNITS CAPS Take 1,000 Units by mouth 2 (two) times daily.      . diazepam (VALIUM) 10 MG tablet take 1/2 tablet by mouth three times a day if needed  45 tablet  0  . diltiazem (CARDIZEM CD) 120 MG 24 hr capsule take 1 capsule by mouth once daily  30 capsule  5  . Ferrous Sulfate (IRON) 90 (18 FE) MG TABS Take 2 tablets by mouth daily.        . fish oil-omega-3 fatty acids 1000 MG capsule Take 2 g by mouth daily. 1200 mg      . losartan (COZAAR) 100 MG tablet 1 tab by mouth daily--office visit due now  90 tablet  0  . metoprolol succinate (TOPROL-XL) 25 MG 24 hr tablet TAKE 1 TABLET TWICE A DAY  180 tablet  0  . Misc Natural Products (OSTEO BI-FLEX ADV TRIPLE ST PO) Take by mouth daily.      . Multiple Vitamin (MULTIVITAMIN) tablet Take 1 tablet by mouth daily.        . prazosin (MINIPRESS) 1 MG capsule TAKE 2 CAPSULES BY MOUTH ONCE EVERY MORNING, 1 EVERY EVENING AND 2 AT BEDTIME  450 capsule  1  . simvastatin (ZOCOR) 40 MG tablet take 1 tablet by mouth at bedtime  90 tablet  1  . tamoxifen (NOLVADEX) 20 MG tablet Take 1 tablet (20 mg total) by mouth daily.  90 tablet  3  .  traMADol (ULTRAM) 50 MG tablet Take 1 tablet (50 mg total) by mouth every 8 (eight) hours as needed for pain.  30 tablet  2  . Vitamin D, Ergocalciferol, (DRISDOL) 50000 UNITS CAPS capsule take 1 capsule by mouth every week (LABS ARE DUE)  4 capsule  0   No current facility-administered medications for this visit.    Past Medical History  Diagnosis Date  . HYPERTENSION 07/25/2006  . ANXIETY 09/23/2007  . HYPERLIPIDEMIA 07/03/2009  . Atrial tachycardia   . MVP (mitral valve prolapse)   . Arthritis   . Osteoarthritis   . Breast cancer   . Anemia 12/10/2011  . Osteoporosis     Past Surgical History  Procedure Laterality Date  . Catart extraction  04/2000, 03/2002  . Carpal tunnel release  09/2004  . Breast surgery  08/08/11    lumpectomy   ROS  As stated in the HPI and negative for all other systems.  PHYSICAL EXAM BP 160/55  Pulse 51  Ht 4\' 11"  (1.499 m)  Wt 102 lb (46.267 kg)  BMI 20.59 kg/m2 GENERAL:  Well  appearing, looks younger than stated age  NECK:  No jugular venous distention, waveform within normal limits, carotid upstroke brisk and symmetric, no bruits, no thyromegaly LUNGS:  Clear to auscultation bilaterally CHEST:  Unremarkable HEART:  PMI not displaced or sustained,S1 and S2 within normal limits, no S3, no S4, no clicks, no rubs, no murmurs ABD:  Flat, positive bowel sounds normal in frequency in pitch, no bruits, no rebound, no guarding, no midline pulsatile mass, no hepatomegaly, no splenomegaly EXT:  2 plus pulses throughout, no edema, no cyanosis no clubbing  EKG:  Sinus rhythm, rate 51, axis within normal limits, intervals within normal limits, no acute ST-T wave changes. 01/21/2014   ASSESSMENT AND PLAN  HYPERTENSION -  Her blood pressure fluctuates slightly. However,it is on average at target. No change in therapy is indicated.  PALPITATIONS -  She is not particularly bothered by this.  She will continue on the meds as listed  HYPERLIPIDEMIA - She  will reduce her Zocor to 20 mg daily.  Her last LDL was 44

## 2014-01-26 ENCOUNTER — Other Ambulatory Visit: Payer: Self-pay | Admitting: *Deleted

## 2014-01-26 DIAGNOSIS — C50919 Malignant neoplasm of unspecified site of unspecified female breast: Secondary | ICD-10-CM

## 2014-01-26 DIAGNOSIS — C50219 Malignant neoplasm of upper-inner quadrant of unspecified female breast: Secondary | ICD-10-CM

## 2014-02-01 ENCOUNTER — Encounter: Payer: Self-pay | Admitting: Medical

## 2014-02-01 ENCOUNTER — Ambulatory Visit (INDEPENDENT_AMBULATORY_CARE_PROVIDER_SITE_OTHER): Payer: Medicare Other | Admitting: Medical

## 2014-02-01 VITALS — BP 145/67 | HR 70 | Temp 98.4°F | Ht 59.0 in | Wt 99.6 lb

## 2014-02-01 DIAGNOSIS — I2510 Atherosclerotic heart disease of native coronary artery without angina pectoris: Secondary | ICD-10-CM

## 2014-02-01 DIAGNOSIS — J309 Allergic rhinitis, unspecified: Secondary | ICD-10-CM

## 2014-02-01 MED ORDER — MOMETASONE FUROATE 50 MCG/ACT NA SUSP: NASAL | Status: DC

## 2014-02-01 NOTE — Progress Notes (Signed)
Pre visit review using our clinic review tool, if applicable. No additional management support is needed unless otherwise documented below in the visit note. 

## 2014-02-01 NOTE — Assessment & Plan Note (Signed)
Advised use claritin otc and rx nasonex. No antibiotic indicated today but advised that if symptoms worsen indicating sinusitis or bronchitis then notify us and could rx antibiotic.

## 2014-02-01 NOTE — Patient Instructions (Signed)
Please get claritin otc(no d formulation) and get prescription nasonex sent to your pharmacy. If nasonex not covered or too expensive would advise get either flonase otc or nasocort otc spray. If worsening symptoms indicating sinusitis or bronchitis notify us. Follow up 7-10 days or as needed.

## 2014-02-01 NOTE — Progress Notes (Signed)
   Subjective:    Patient ID: Gabrielle Warren, female    DOB: 14-Mar-1926, 80 y.o.   MRN: [id number]  HPI  Pt in feeling mild congested. She has pnd and runny nose. Ears feel pressure. Pt has been sneezing every day. Also mild hoarse voice. Pt states symptom have been almost 10 days or more. No fevers, no chills or sweats. Only states other day temp 99. Pt has not tried anything other than benadryl. It makes her groggy. Also tried tylenol    Review of Systems  Constitutional: Negative for fever and chills.       Mild temp 99 other day. But none since.  HENT: Positive for congestion, postnasal drip, rhinorrhea, sneezing and voice change. Negative for ear discharge, ear pain, hearing loss, nosebleeds, sinus pressure, sore throat, tinnitus and trouble swallowing.        Some mild ear pressure. Also some mild hoarse voice with other illness symptoms.  Respiratory: Positive for cough. Negative for choking, chest tightness and wheezing.        Occasional cough but dry and not productive.  Cardiovascular: Negative for chest pain and palpitations.  Musculoskeletal: Negative.   Neurological: Negative.   Hematological: Negative for adenopathy. Does not bruise/bleed easily.       Objective:   Physical Exam General  Mental Status - Alert. General Appearance - Well groomed. Not in acute distress.  Skin Rashes- No Rashes.  HEENT Head- Normal. Ear Auditory Canal - Left- Normal. Right - Normal.Tympanic Membrane- Left- Normal. Right- Normal. Eye Sclera/Conjunctiva- Left- Normal. Right- Normal. Nose & Sinuses Nasal Mucosa- Left- mild  Boggy and Congested. Right- Mild  Boggy and  Congested. Mouth & Throat Lips: Upper Lip- Normal: no dryness, cracking, pallor, cyanosis, or vesicular eruption. Lower Lip-Normal: no dryness, cracking, pallor, cyanosis or vesicular eruption. Buccal Mucosa- Bilateral- No Aphthous ulcers. Oropharynx- No Discharge or Erythema. Tonsils: Characteristics- Bilateral-  No Erythema or Congestion. Size/Enlargement- Bilateral- No enlargement. Discharge- bilateral-None.  Neck Neck- Supple. No Masses.   Chest and Lung Exam Auscultation: Breath Sounds:-Normal, clear, even and unlabored.  Cardiovascular Auscultation:Rythm- Regular.  Murmurs & Other Heart Sounds:Ausculatation of the heart reveal- No Murmurs.  Lymphatic Head & Neck General Head & Neck Lymphatics: Bilateral: Description- No Localized lymphadenopathy.        Assessment & Plan:

## 2014-02-04 ENCOUNTER — Other Ambulatory Visit: Payer: Self-pay | Admitting: Family Medicine

## 2014-02-04 NOTE — Telephone Encounter (Signed)
Last Vitamin D drawn 12/19/11 and was 22. Please advise     KP

## 2014-02-10 ENCOUNTER — Other Ambulatory Visit: Payer: Self-pay | Admitting: Family Medicine

## 2014-02-18 ENCOUNTER — Other Ambulatory Visit: Payer: Self-pay | Admitting: Cardiology

## 2014-02-18 NOTE — Telephone Encounter (Signed)
error 

## 2014-02-23 ENCOUNTER — Other Ambulatory Visit: Payer: Self-pay | Admitting: Family Medicine

## 2014-02-23 NOTE — Telephone Encounter (Signed)
Last seen 03/30/13 and filled 01/05/14 #45  Please advise    KP

## 2014-03-02 ENCOUNTER — Other Ambulatory Visit: Payer: Self-pay | Admitting: Family Medicine

## 2014-03-17 ENCOUNTER — Telehealth: Payer: Self-pay | Admitting: Family Medicine

## 2014-03-17 MED ORDER — DILTIAZEM HCL ER COATED BEADS 120 MG PO CP24: ORAL_CAPSULE | ORAL | Status: DC

## 2014-03-17 NOTE — Telephone Encounter (Signed)
I sent it but She needs an appointment, she has not see Dr.Lowne in a year.      KP

## 2014-03-17 NOTE — Telephone Encounter (Signed)
Caller name: Amerah Relation to pt: self Call back number: [phone] Pharmacy: [facility] on [place]  Reason for call:   Patient would like to know if Dr. Etter Sjogren would send in a 90 day supply of cartia

## 2014-03-18 NOTE — Telephone Encounter (Signed)
Informed patient of this.  °

## 2014-04-03 ENCOUNTER — Other Ambulatory Visit: Payer: Self-pay | Admitting: Family Medicine

## 2014-04-04 NOTE — Telephone Encounter (Signed)
Last vitamin d check was 12/19/11. Please advise      KP

## 2014-04-06 ENCOUNTER — Other Ambulatory Visit: Payer: Self-pay | Admitting: Family Medicine

## 2014-04-06 NOTE — Telephone Encounter (Signed)
Last seen 02/01/14 and filled Valium 02/23/14 #45 and Prazosin 10/20/13 #450 with 1 refill. Dr.Lowe decided not to UDS this patient.    Please advise    KP

## 2014-05-08 ENCOUNTER — Other Ambulatory Visit: Payer: Self-pay | Admitting: Family Medicine

## 2014-05-17 ENCOUNTER — Ambulatory Visit (INDEPENDENT_AMBULATORY_CARE_PROVIDER_SITE_OTHER): Payer: Medicare Other | Admitting: Cardiology

## 2014-05-17 ENCOUNTER — Telehealth: Payer: Self-pay

## 2014-05-17 ENCOUNTER — Encounter: Payer: Self-pay | Admitting: Cardiology

## 2014-05-17 VITALS — BP 194/90 | HR 70 | Ht 59.0 in | Wt 103.0 lb

## 2014-05-17 DIAGNOSIS — I10 Essential (primary) hypertension: Secondary | ICD-10-CM

## 2014-05-17 DIAGNOSIS — I2510 Atherosclerotic heart disease of native coronary artery without angina pectoris: Secondary | ICD-10-CM

## 2014-05-17 MED ORDER — CLONIDINE HCL 0.1 MG PO TABS: ORAL_TABLET | ORAL | Status: DC

## 2014-05-17 MED ORDER — DIAZEPAM 10 MG PO TABS: ORAL_TABLET | ORAL | Status: DC

## 2014-05-17 NOTE — Telephone Encounter (Signed)
Refill x1   1 refill 

## 2014-05-17 NOTE — Addendum Note (Signed)
Addended by: Golden Hurter D on: 05/17/2014 04:04 PM   Modules accepted: Orders, Medications

## 2014-05-17 NOTE — Telephone Encounter (Signed)
Rx faxed.    KP 

## 2014-05-17 NOTE — Telephone Encounter (Signed)
Last seen 02/01/14 and filled 04/06/14 #45  Please advise       KP

## 2014-05-17 NOTE — Patient Instructions (Signed)
Take Clonidine 0.1 mg 1/2 tablet daily if blood pressure sustains greater than 127 systolic.    Your physician recommends that you schedule a follow-up appointment in: 1 month with Cecilie Kicks NP Friday 06/17/14 at 2:00 pm

## 2014-05-17 NOTE — Progress Notes (Signed)
HPI The patient presents for evaluation of hypertension. Her blood pressure has been elevated more recently. I reviewed her diary. In early October and again early this month she was having fairly frequent systolic blood pressures in the 170 to 160 range. She is not describing any palpitations.  She's had no presyncope or syncope. She's had no chest pressure, neck or arm discomfort. She's had no weight gain. She denies any new shortness of breath, PND or orthopnea. 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   Allergies  Allergen Reactions  . Fosamax [Alendronate Sodium] Other (See Comments)    Canker sores and bleeding gums    Current Outpatient Prescriptions  Medication Sig Dispense Refill  . aspirin 81 MG tablet Take 81 mg by mouth daily.      . Calcium Carbonate-Vit D-Min (CALCIUM 1200 PO) Take 1,200 Units by mouth 2 (two) times daily.    . cholecalciferol (VITAMIN D) 1000 UNITS tablet 800 Units. 2 po qd with calcium 600 mg    . Cholecalciferol (VITAMIN D-3) 1000 UNITS CAPS Take 1,000 Units by mouth 2 (two) times daily.    . diazepam (VALIUM) 10 MG tablet take 1/2 tablet by mouth three times a day if needed 45 tablet 1  . diltiazem (CARTIA XT) 120 MG 24 hr capsule take 1 capsule by mouth once daily 90 capsule 0  . Ferrous Sulfate (IRON) 90 (18 FE) MG TABS Take 2 tablets by mouth daily.      . fish oil-omega-3 fatty acids 1000 MG capsule Take 2 g by mouth daily. 1200 mg    . losartan (COZAAR) 100 MG tablet take 1 tablet by mouth once daily (OFFICE VISIT DUE) 90 tablet 0  . metoprolol succinate (TOPROL-XL) 25 MG 24 hr tablet Take 1 tablet (25 mg total) by mouth 2 (two) times daily. 180 tablet 3  . Misc Natural Products (OSTEO BI-FLEX ADV TRIPLE ST PO) Take by mouth daily.    . mometasone (NASONEX) 50 MCG/ACT nasal spray 2 sprays each nares q day 17 g 0  . Multiple Vitamin (MULTIVITAMIN) tablet Take 1 tablet by  mouth daily.      . prazosin (MINIPRESS) 1 MG capsule take 2 capsules by mouth every morning and take 1 capsule by mouth every evening and take 2 capsules at bedtime 450 capsule 1  . simvastatin (ZOCOR) 20 MG tablet Take 1 tablet (20 mg total) by mouth daily. 90 tablet 1  . tamoxifen (NOLVADEX) 20 MG tablet Take 1 tablet (20 mg total) by mouth daily. 90 tablet 3  . traMADol (ULTRAM) 50 MG tablet Take 1 tablet (50 mg total) by mouth every 8 (eight) hours as needed for pain. 30 tablet 2  . Vitamin D, Ergocalciferol, (DRISDOL) 50000 UNITS CAPS capsule take 1 capsule by mouth every week 4 capsule 0   No current facility-administered medications for this visit.    Past Medical History  Diagnosis Date  . HYPERTENSION 07/25/2006  . ANXIETY 09/23/2007  . HYPERLIPIDEMIA 07/03/2009  . Atrial tachycardia   . MVP (mitral valve prolapse)   . Arthritis   . Osteoarthritis   . Breast cancer   . Anemia 12/10/2011  . Osteoporosis     Past Surgical History  Procedure Laterality Date  . Catart extraction  04/2000, 03/2002  . Carpal tunnel release  09/2004  . Breast surgery  08/08/11    lumpectomy   ROS  As stated in the HPI and negative for all other systems.  PHYSICAL EXAM BP 194/90 mmHg  Pulse 70  Ht 4\' 11"  (1.499 m)  Wt 103 lb (46.72 kg)  BMI 20.79 kg/m2 GENERAL:  Well appearing, looks younger than stated age  NECK:  No jugular venous distention, waveform within normal limits, carotid upstroke brisk and symmetric, no bruits, no thyromegaly LUNGS:  Clear to auscultation bilaterally CHEST:  Unremarkable HEART:  PMI not displaced or sustained,S1 and S2 within normal limits, no S3, no S4, no clicks, no rubs, no murmurs ABD:  Flat, positive bowel sounds normal in frequency in pitch, no bruits, no rebound, no guarding, no midline pulsatile mass, no hepatomegaly, no splenomegaly EXT:  2 plus pulses throughout, no edema, no cyanosis no clubbing   ASSESSMENT AND PLAN  HYPERTENSION -  I did give her  a 0.1 clonidine when necessary to take one half showed her blood pressure reach 180 and sustained after she has stopped, relax, taken one of her Valium. She's having used this frequently she knows to let me know. She knows she could get fatigued when taking her clonidine and she should not be driving when taking these. For now she will otherwise continue the meds as listed.  PALPITATIONS -  She is not particularly bothered by this.  She will continue on the meds as listed

## 2014-05-22 ENCOUNTER — Other Ambulatory Visit: Payer: Self-pay | Admitting: Oncology

## 2014-05-25 ENCOUNTER — Other Ambulatory Visit: Payer: Self-pay | Admitting: Family Medicine

## 2014-05-26 NOTE — Telephone Encounter (Signed)
please advise if refill is appropriate.      KP

## 2014-06-09 ENCOUNTER — Encounter: Payer: Self-pay | Admitting: Internal Medicine

## 2014-06-09 ENCOUNTER — Other Ambulatory Visit (INDEPENDENT_AMBULATORY_CARE_PROVIDER_SITE_OTHER): Payer: Medicare Other

## 2014-06-09 ENCOUNTER — Ambulatory Visit: Payer: Medicare Other | Admitting: Internal Medicine

## 2014-06-09 ENCOUNTER — Ambulatory Visit (INDEPENDENT_AMBULATORY_CARE_PROVIDER_SITE_OTHER): Payer: Medicare Other | Admitting: Internal Medicine

## 2014-06-09 VITALS — BP 178/64 | HR 84 | Temp 97.9°F | Resp 14 | Ht 59.0 in | Wt 100.1 lb

## 2014-06-09 DIAGNOSIS — D509 Iron deficiency anemia, unspecified: Secondary | ICD-10-CM

## 2014-06-09 DIAGNOSIS — M899 Disorder of bone, unspecified: Secondary | ICD-10-CM

## 2014-06-09 DIAGNOSIS — E559 Vitamin D deficiency, unspecified: Secondary | ICD-10-CM

## 2014-06-09 DIAGNOSIS — R300 Dysuria: Secondary | ICD-10-CM

## 2014-06-09 DIAGNOSIS — I2510 Atherosclerotic heart disease of native coronary artery without angina pectoris: Secondary | ICD-10-CM

## 2014-06-09 DIAGNOSIS — M810 Age-related osteoporosis without current pathological fracture: Secondary | ICD-10-CM

## 2014-06-09 DIAGNOSIS — D649 Anemia, unspecified: Secondary | ICD-10-CM

## 2014-06-09 DIAGNOSIS — E785 Hyperlipidemia, unspecified: Secondary | ICD-10-CM

## 2014-06-09 DIAGNOSIS — C50119 Malignant neoplasm of central portion of unspecified female breast: Secondary | ICD-10-CM

## 2014-06-09 DIAGNOSIS — Z0000 Encounter for general adult medical examination without abnormal findings: Secondary | ICD-10-CM

## 2014-06-09 DIAGNOSIS — M898X9 Other specified disorders of bone, unspecified site: Secondary | ICD-10-CM

## 2014-06-09 DIAGNOSIS — I10 Essential (primary) hypertension: Secondary | ICD-10-CM

## 2014-06-09 LAB — COMPREHENSIVE METABOLIC PANEL
ALK PHOS: 34 U/L — AB (ref 39–117)
ALT: 21 U/L (ref 0–35)
AST: 26 U/L (ref 0–37)
Albumin: 3.8 g/dL (ref 3.5–5.2)
BUN: 10 mg/dL (ref 6–23)
CO2: 24 meq/L (ref 19–32)
Calcium: 8.9 mg/dL (ref 8.4–10.5)
Chloride: 103 mEq/L (ref 96–112)
Creatinine, Ser: 0.8 mg/dL (ref 0.4–1.2)
GFR: 71.25 mL/min (ref >60.00)
Glucose, Bld: 132 mg/dL — ABNORMAL HIGH (ref 70–99)
Potassium: 3.8 mEq/L (ref 3.5–5.1)
SODIUM: 138 meq/L (ref 135–145)
TOTAL PROTEIN: 6.8 g/dL (ref 6.0–8.3)
Total Bilirubin: 0.8 mg/dL (ref 0.2–1.2)

## 2014-06-09 LAB — CBC
HEMATOCRIT: 33.5 % — AB (ref 36.0–46.0)
HEMOGLOBIN: 11.1 g/dL — AB (ref 12.0–15.0)
MCHC: 33.1 g/dL (ref 30.0–36.0)
MCV: 97.2 fl (ref 78.0–100.0)
Platelets: 192 10*3/uL (ref 150.0–400.0)
RBC: 3.45 Mil/uL — ABNORMAL LOW (ref 3.87–5.11)
RDW: 12.8 % (ref 11.5–15.5)
WBC: 7.2 10*3/uL (ref 4.0–10.5)

## 2014-06-09 LAB — LIPID PANEL
CHOLESTEROL: 133 mg/dL (ref 0–200)
HDL: 61.3 mg/dL (ref >39.00)
LDL CALC: 59 mg/dL (ref 0–99)
NONHDL: 71.7
Total CHOL/HDL Ratio: 2
Triglycerides: 65 mg/dL (ref 0.0–149.0)
VLDL: 13 mg/dL (ref 0.0–40.0)

## 2014-06-09 LAB — VITAMIN D 25 HYDROXY (VIT D DEFICIENCY, FRACTURES): VITD: 88.1 ng/mL (ref 30.00–100.00)

## 2014-06-09 LAB — FERRITIN: Ferritin: 237.4 ng/mL (ref 10.0–291.0)

## 2014-06-09 NOTE — Patient Instructions (Signed)
We will check all your blood work today. Depending on your vitamin D level we may end up stopping the once weekly vitamin D medicine that you're taking.  We will see back in about 6 months to check on how you're doing.  If you have any other problems or questions before then please feel free to call our office.  Fall Prevention and Home Safety Falls cause injuries and can affect all age groups. It is possible to use preventive measures to significantly decrease the likelihood of falls. There are many simple measures which can make your home safer and prevent falls. OUTDOORS  Repair cracks and edges of walkways and driveways.  Remove high doorway thresholds.  Trim shrubbery on the main path into your home.  Have good outside lighting.  Clear walkways of tools, rocks, debris, and clutter.  Check that handrails are not broken and are securely fastened. Both sides of steps should have handrails.  Have leaves, snow, and ice cleared regularly.  Use sand or salt on walkways during winter months.  In the garage, clean up grease or oil spills. BATHROOM  Install night lights.  Install grab bars by the toilet and in the tub and shower.  Use non-skid mats or decals in the tub or shower.  Place a plastic non-slip stool in the shower to sit on, if needed.  Keep floors dry and clean up all water on the floor immediately.  Remove soap buildup in the tub or shower on a regular basis.  Secure bath mats with non-slip, double-sided rug tape.  Remove throw rugs and tripping hazards from the floors. BEDROOMS  Install night lights.  Make sure a bedside light is easy to reach.  Do not use oversized bedding.  Keep a telephone by your bedside.  Have a firm chair with side arms to use for getting dressed.  Remove throw rugs and tripping hazards from the floor. KITCHEN  Keep handles on pots and pans turned toward the center of the stove. Use back burners when possible.  Clean up  spills quickly and allow time for drying.  Avoid walking on wet floors.  Avoid hot utensils and knives.  Position shelves so they are not too high or low.  Place commonly used objects within easy reach.  If necessary, use a sturdy step stool with a grab bar when reaching.  Keep electrical cables out of the way.  Do not use floor polish or wax that makes floors slippery. If you must use wax, use non-skid floor wax.  Remove throw rugs and tripping hazards from the floor. STAIRWAYS  Never leave objects on stairs.  Place handrails on both sides of stairways and use them. Fix any loose handrails. Make sure handrails on both sides of the stairways are as long as the stairs.  Check carpeting to make sure it is firmly attached along stairs. Make repairs to worn or loose carpet promptly.  Avoid placing throw rugs at the top or bottom of stairways, or properly secure the rug with carpet tape to prevent slippage. Get rid of throw rugs, if possible.  Have an electrician put in a light switch at the top and bottom of the stairs. OTHER FALL PREVENTION TIPS  Wear low-heel or rubber-soled shoes that are supportive and fit well. Wear closed toe shoes.  When using a stepladder, make sure it is fully opened and both spreaders are firmly locked. Do not climb a closed stepladder.  Add color or contrast paint or tape to grab  bars and handrails in your home. Place contrasting color strips on first and last steps.  Learn and use mobility aids as needed. Install an electrical emergency response system.  Turn on lights to avoid dark areas. Replace light bulbs that burn out immediately. Get light switches that glow.  Arrange furniture to create clear pathways. Keep furniture in the same place.  Firmly attach carpet with non-skid or double-sided tape.  Eliminate uneven floor surfaces.  Select a carpet pattern that does not visually hide the edge of steps.  Be aware of all pets. OTHER HOME SAFETY  TIPS  Set the water temperature for 120 F (48.8 C).  Keep emergency numbers on or near the telephone.  Keep smoke detectors on every level of the home and near sleeping areas. Document Released: 06/14/2002 Document Revised: 12/24/2011 Document Reviewed: 09/13/2011 [facility] Patient Information 2015 [place], [place]. This information is not intended to replace advice given to you by your health care provider. Make sure you discuss any questions you have with your health care provider.

## 2014-06-09 NOTE — Progress Notes (Signed)
Pre visit review using our clinic review tool, if applicable. No additional management support is needed unless otherwise documented below in the visit note. 

## 2014-06-10 NOTE — Assessment & Plan Note (Signed)
BP elevated today, recheck more normal. Her home log demonstrates normal blood pressures. She does have when necessary clonidine but she has not used it. Given her acute urinary infection will hold on changing blood pressure medication until next visit.

## 2014-06-10 NOTE — Progress Notes (Signed)
   Subjective:    Patient ID: Gabrielle Warren, female    DOB: [birth year], 80 y.o.   MRN: [id number]  HPI The patient is an 20 show female comes in today to establish care. She does have some new complaints and is having some urinary frequency last week with some pain with urination. It did go away for the most part but now in the last day or so she's noticed that is coming back. She denies fevers or chills. She does have past medical history of allergies, anxiety, CAD, DCIS, osteoarthritis.  Review of Systems  Constitutional: Negative for fever, chills, activity change, appetite change and unexpected weight change.  HENT: Negative.   Respiratory: Negative for cough, chest tightness, shortness of breath and wheezing.   Cardiovascular: Negative for chest pain and leg swelling.  Gastrointestinal: Negative.   Genitourinary: Positive for dysuria, urgency and frequency.      Objective:   Physical Exam  Constitutional: She appears well-developed and well-nourished.  HENT:  Head: Normocephalic and atraumatic.  Eyes: EOM are normal.  Neck: Normal range of motion.  Cardiovascular: Normal rate.   Pulmonary/Chest: Effort normal and breath sounds normal.  Abdominal: Soft. Bowel sounds are normal.  Neurological: She is alert.  Skin: Skin is warm and dry.   Filed Vitals:   06/09/14 0856  BP: 178/64  Pulse: 84  Temp: 97.9 F (36.6 C)  TempSrc: Oral  Resp: 14  Height: 4\' 11"  (1.499 m)  Weight: 100 lb 1.9 oz (45.414 kg)  SpO2: 97%      Assessment & Plan:

## 2014-06-10 NOTE — Assessment & Plan Note (Signed)
Continues to take tamoxifen until 2018. No hot flashes.

## 2014-06-10 NOTE — Assessment & Plan Note (Signed)
Check point-of-care urinalysis. Positive for signs of infection. Given prescription for ciprofloxacin twice a day for 5 days.

## 2014-06-10 NOTE — Assessment & Plan Note (Signed)
Recheck ferritin, CBC.

## 2014-06-12 ENCOUNTER — Telehealth: Payer: Self-pay | Admitting: Internal Medicine

## 2014-06-14 ENCOUNTER — Telehealth: Payer: Self-pay | Admitting: Oncology

## 2014-06-14 ENCOUNTER — Telehealth: Payer: Self-pay | Admitting: Cardiology

## 2014-06-14 NOTE — Telephone Encounter (Signed)
Done

## 2014-06-14 NOTE — Telephone Encounter (Signed)
Pt called to check up on this request. Please check °

## 2014-06-14 NOTE — Telephone Encounter (Signed)
Close encounter 

## 2014-06-14 NOTE — Telephone Encounter (Signed)
S/w pt to advise of appt chg from 1/19 (md pal) to 2/2 with HF (per md instructions). Pt says she couldn't see MD last year and that this would make 2 years without seeing her Med Onc. Pt says she will wait but wants to see MD only. Gave appt for 2/29 @ 3pm with MD also mailed appt calendar.

## 2014-06-17 ENCOUNTER — Ambulatory Visit: Payer: Medicare Other | Admitting: Cardiology

## 2014-06-21 ENCOUNTER — Encounter (INDEPENDENT_AMBULATORY_CARE_PROVIDER_SITE_OTHER): Payer: Self-pay

## 2014-06-21 ENCOUNTER — Ambulatory Visit
Admission: RE | Admit: 2014-06-21 | Discharge: 2014-06-21 | Disposition: A | Discharge status: Home / Self Care | Payer: Medicare Other | Source: Ambulatory Visit | Attending: Physician Assistant | Admitting: Physician Assistant

## 2014-06-21 DIAGNOSIS — Z853 Personal history of malignant neoplasm of breast: Secondary | ICD-10-CM

## 2014-06-23 ENCOUNTER — Telehealth: Payer: Self-pay | Admitting: Internal Medicine

## 2014-06-23 NOTE — Telephone Encounter (Signed)
Is requesting a copy of her recent lab work.

## 2014-06-24 ENCOUNTER — Other Ambulatory Visit: Payer: Self-pay | Admitting: Family Medicine

## 2014-06-24 NOTE — Telephone Encounter (Signed)
Sent!

## 2014-06-26 ENCOUNTER — Other Ambulatory Visit: Payer: Self-pay | Admitting: Internal Medicine

## 2014-07-06 ENCOUNTER — Telehealth: Payer: Self-pay | Admitting: Internal Medicine

## 2014-07-06 NOTE — Telephone Encounter (Signed)
Having concern about iron levels and vitamin D.  States Vitamin D was never reordered.

## 2014-07-07 NOTE — Telephone Encounter (Signed)
Tried to reach patient. Voice mail has not been set up. Not able to leave message.

## 2014-07-26 ENCOUNTER — Ambulatory Visit: Payer: Medicare Other | Admitting: Oncology

## 2014-07-26 ENCOUNTER — Other Ambulatory Visit: Payer: Medicare Other

## 2014-07-31 ENCOUNTER — Other Ambulatory Visit: Payer: Self-pay | Admitting: Family Medicine

## 2014-08-01 ENCOUNTER — Telehealth: Payer: Self-pay | Admitting: Internal Medicine

## 2014-08-01 MED ORDER — VITAMIN D (ERGOCALCIFEROL) 1.25 MG (50000 UNIT) PO CAPS: 50000.0000 [IU] | ORAL_CAPSULE | ORAL | Status: DC

## 2014-08-01 NOTE — Telephone Encounter (Signed)
Notified pt rx sent to [facility]..lmb 

## 2014-08-01 NOTE — Telephone Encounter (Signed)
Pt called in and requesting refill on her Vitamin D, Ergocalciferol, (DRISDOL) 50000 UNITS CAPS capsule [[id number]]      [facility] on [address]

## 2014-08-07 ENCOUNTER — Other Ambulatory Visit: Payer: Self-pay | Admitting: Internal Medicine

## 2014-08-15 NOTE — Telephone Encounter (Signed)
Left msg on triage stating she has been trying to get refills on her diazepam & vitamin D. Pharmacy has sent request have not received response...Gabrielle Warren

## 2014-08-16 MED ORDER — VITAMIN D (ERGOCALCIFEROL) 1.25 MG (50000 UNIT) PO CAPS: 50000.0000 [IU] | ORAL_CAPSULE | ORAL | Status: DC

## 2014-08-16 NOTE — Telephone Encounter (Signed)
Faxed script back to [facility]...lmb 

## 2014-08-31 ENCOUNTER — Telehealth: Payer: Self-pay | Admitting: Internal Medicine

## 2014-08-31 NOTE — Telephone Encounter (Signed)
Patient is requesting refill on metoprolol succinate sent to [facility] on [address]

## 2014-09-01 ENCOUNTER — Other Ambulatory Visit: Payer: Self-pay | Admitting: Geriatric Medicine

## 2014-09-01 MED ORDER — METOPROLOL SUCCINATE ER 25 MG PO TB24: 25.0000 mg | ORAL_TABLET | Freq: Two times a day (BID) | ORAL | Status: DC

## 2014-09-01 NOTE — Telephone Encounter (Signed)
Sent to pharmacy. Patient aware.  

## 2014-09-01 NOTE — Telephone Encounter (Signed)
Patient's daughter has called back in regards to this.  States patient is almost out of med.

## 2014-09-01 NOTE — Telephone Encounter (Signed)
Patient has called back in regards to this and is requesting a call back.

## 2014-09-04 ENCOUNTER — Other Ambulatory Visit: Payer: Self-pay | Admitting: Cardiology

## 2014-09-05 ENCOUNTER — Ambulatory Visit (HOSPITAL_BASED_OUTPATIENT_CLINIC_OR_DEPARTMENT_OTHER): Payer: Medicare Other | Admitting: Oncology

## 2014-09-05 ENCOUNTER — Telehealth: Payer: Self-pay | Admitting: Oncology

## 2014-09-05 ENCOUNTER — Other Ambulatory Visit: Payer: Medicare Other

## 2014-09-05 VITALS — BP 166/62 | HR 67 | Temp 98.4°F | Resp 18 | Ht 59.0 in | Wt 106.5 lb

## 2014-09-05 DIAGNOSIS — C50911 Malignant neoplasm of unspecified site of right female breast: Secondary | ICD-10-CM

## 2014-09-05 DIAGNOSIS — D630 Anemia in neoplastic disease: Secondary | ICD-10-CM

## 2014-09-05 DIAGNOSIS — D649 Anemia, unspecified: Secondary | ICD-10-CM

## 2014-09-05 DIAGNOSIS — C50219 Malignant neoplasm of upper-inner quadrant of unspecified female breast: Secondary | ICD-10-CM

## 2014-09-05 DIAGNOSIS — Z170 Estrogen receptor positive status [ER+]: Secondary | ICD-10-CM

## 2014-09-05 DIAGNOSIS — C50919 Malignant neoplasm of unspecified site of unspecified female breast: Secondary | ICD-10-CM

## 2014-09-05 MED ORDER — DIAZEPAM 10 MG PO TABS: 5.0000 mg | ORAL_TABLET | Freq: Two times a day (BID) | ORAL | Status: DC | PRN

## 2014-09-05 NOTE — Progress Notes (Signed)
Gabrielle Warren  DOB: April 19, 1926  MR#: [id number]  CSN#: [id number]    PCP: Olga Millers, MD GYN: Cheri Fowler, MD SU: Coralie Keens, MD OTHER MD: Minus Breeding, MD  CHIEF COMPLAINT:   Estrogen receptor positive Right Breast Cancer   CURRENT TREATMENT: Tamoxifen                 History of present illness:    from the original intake note:   "Gabrielle Warren is an 81 year old [place] woman who on screening mammography at [facility] was found to have a suspicious mass. I do not have those records, but additional views confirmed the abnormality andb iopsy was attempted 74/06/8785, complicated by bleeding. The patient was then referred to the breast Center whereultrasound-guided biopsy was performed 07/05/2011. This showed an invasive ductal carcinoma, grade 1, HER-2 negative, 100% estrogen receptor positive, 86% progesterone receptor positive, with an MIB-1 of 8%.the patient was then referred for bilateral breast MRIs which showed a 9 mm spiculated enhancing mass in the middle third of the right breast. There was an associated clip artifact. In the contralateral breast, a 5 mm mass was found which was irregular and nodular, with washout genetics. Biopsy of the second mass under MRI guidance (it could not be visualized under ultrasound) is scheduled for January 10. "  Her subsequent history is as detailed below  Interval history:  Gabrielle Warren returns today for follow-up of her early-stage estrogen receptor positive breast cancer.  She continues on tamoxifen, which she is tolerating well. She pays about $2 a month for it. She has no hot flashes and no vaginal discharge that she is aware of. (She has a pessary in place) .For exercise mostly she walks.  Review of Systems: Gabrielle Warren  Has been checking her blood pressure at home and it has been in the 140s most of the time, rarely 150s. She continues to have right leg sciatica and some low back pain. These are new symptoms. There  have been previously evaluated and found to be due to degenerative disc disease. She continues to have blurred vision problems. Currently she has some ringing in her ears, hoarseness, and sinus symptoms, but no cough or shortness of breath. She has stress urinary incontinence. She bruises easily. She feels forgetful and anxious but not depressed. A detailed review of systems today was otherwise noncontributory   Past medical history:      Past Medical History  Diagnosis Date  . HYPERTENSION 07/25/2006  . ANXIETY 09/23/2007  . HYPERLIPIDEMIA 07/03/2009  . Atrial tachycardia   . MVP (mitral valve prolapse)   . Arthritis   . Osteoarthritis   . Breast cancer   . Anemia 12/10/2011  . Osteoporosis   coronary artery disease, status post remote heart attack (7672)  Past surgical history:      Past Surgical History  Procedure Laterality Date  . Catart extraction  04/2000, 03/2002  . Carpal tunnel release  09/2004  . Breast surgery  08/08/11    lumpectomy    Family history:     Family History  Problem Relation Age of Onset  . Heart failure Mother   . Stroke Father   . Hypertension Other   the patient's father died suddenly at the age of 71, of unknown causes. The patient's mother died at the age of 52 from complications following a leg amputation. The patient had 2 brothers and 2 sisters. There is no history of breast or ovarian cancer in the immediate family  Gynecologic history:  The patient had menarche age 67, last menstrual period 73. She took Prempro for many years, stopping only in December of 2012 with this diagnosis. The patient is GX P3, first pregnancy to term age 44.    Social history:  (Updated 09/22/2013) The patient's maiden name was [place]. Her family originally came from [place]. 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 Her 3 children are Daughter Gabrielle Warren age 70 works in [place] as a Licensed conveyancer. Son Gabrielle Warren 12 lives in [place] and  is a retired Scientist, research (medical). Son Gabrielle Warren") 54, lives in [place] and works in Nutritional therapist. The patient has no grandchildren. She is alone at home except for a cat, but tells me that her daughter Gabrielle Warren lives nearby.  As of March 2015, she is trying to sell her home, and plans to move in with [place].    ADVANCED DIRECTIVES: yes  Health maintenance:      (Updated 09/22/2013) History  Substance Use Topics  . Smoking status: Former Smoker    Quit date: 07/08/1994  . Smokeless tobacco: Never Used  . Alcohol Use: Yes      Colonoscopy: never  PAP: UTD/[facility]  Bone density: Dec 2011, severe osteopenia with T score of -2.4 ([facility])  Cholesterol: Dec 2014/[facility]  Allergies:     Allergies  Allergen Reactions  . Fosamax [Alendronate Sodium] Other (See Comments)    Canker sores and bleeding gums    Medications:      Current Outpatient Prescriptions  Medication Sig Dispense Refill  . aspirin 81 MG tablet Take 81 mg by mouth daily.      . cholecalciferol (VITAMIN D) 1000 UNITS tablet 800 Units. 2 po qd with calcium 600 mg    . Cholecalciferol (VITAMIN D-3) 1000 UNITS CAPS Take 1,000 Units by mouth 2 (two) times daily.    . cloNIDine (CATAPRES) 0.1 MG tablet Take 1/2 tablet daily if B/P sustains greater than 356 systolic 30 tablet 6  . diazepam (VALIUM) 10 MG tablet Take 0.5 tablets (5 mg total) by mouth 2 (two) times daily as needed for anxiety. 45 tablet 3  . diltiazem (CARDIZEM CD) 120 MG 24 hr capsule take 1 capsule by mouth once daily 90 capsule 3  . Ferrous Sulfate (IRON) 90 (18 FE) MG TABS Take 2 tablets by mouth daily.      . fish oil-omega-3 fatty acids 1000 MG capsule Take 2 g by mouth daily. 1200 mg    . losartan (COZAAR) 100 MG tablet take 1 tablet by mouth once daily (OFFICE VISIT DUE) 90 tablet 0  . metoprolol succinate (TOPROL-XL) 25 MG 24 hr tablet Take 1 tablet (25 mg total) by mouth 2 (two) times daily. 180 tablet 3  . mometasone (NASONEX) 50 MCG/ACT  nasal spray 2 sprays each nares q day 17 g 0  . Multiple Vitamin (MULTIVITAMIN) tablet Take 1 tablet by mouth daily.      . prazosin (MINIPRESS) 1 MG capsule take 2 capsules by mouth every morning and take 1 capsule by mouth every evening and take 2 capsules at bedtime 450 capsule 1  . simvastatin (ZOCOR) 20 MG tablet take 1 tablet by mouth once daily 90 tablet 0  . tamoxifen (NOLVADEX) 20 MG tablet Take 1 tablet (20 mg total) by mouth daily. 90 tablet 3  . traMADol (ULTRAM) 50 MG tablet Take 1 tablet (50 mg total) by mouth every 8 (eight) hours as needed for pain. 30 tablet 2  . Vitamin  D, Ergocalciferol, (DRISDOL) 50000 UNITS CAPS capsule Take 1 capsule (50,000 Units total) by mouth once a week. 4 capsule 5   No current facility-administered medications for this visit.    Physical exam:  Elderly white woman who appears  Stated age  87 Vitals:   09/05/14 1558  BP: 166/62  Warren: 67  Temp: 98.4 F (36.9 C)  Resp: 18     Body mass index is 21.5 kg/(m^2).  ECOG PS: 1 Filed Weights   09/05/14 1558  Weight: 106 lb 8 oz (48.308 kg)   Sclerae unicteric,  EOMs intact Oropharynx clear and moist No cervical or supraclavicular adenopathy Lungs no rales or rhonchi Heart regular rate and rhythm Abd soft, nontender, positive bowel sounds MSK no focal spinal tenderness, no upper extremity lymphedema Neuro: nonfocal, well oriented, appropriate affect Breasts:  The right breast is status post lumpectomy. There is no evidence of local recurrence. The right axilla is benign. Breast is unremarkable.    Lab results:        Component Value Date/Time   WBC 7.2 06/09/2014 0956   WBC 9.0 01/11/2013 1006   RBC 3.45* 06/09/2014 0956   RBC 3.44* 06/08/2013 1418   RBC 3.41* 01/11/2013 1006   HGB 11.1* 06/09/2014 0956   HGB 11.1* 01/11/2013 1006   HCT 33.5* 06/09/2014 0956   HCT 31.7* 01/11/2013 1006   PLT 192.0 06/09/2014 0956   PLT 195 01/11/2013 1006   MCV 97.2 06/09/2014 0956   MCV 93.1  01/11/2013 1006   MCH 32.7 01/11/2013 1006   MCHC 33.1 06/09/2014 0956   MCHC 35.1 01/11/2013 1006   RDW 12.8 06/09/2014 0956   RDW 12.3 01/11/2013 1006   LYMPHSABS 1.9 12/14/2013 0829   LYMPHSABS 2.0 01/11/2013 1006   MONOABS 0.7 12/14/2013 0829   MONOABS 0.8 01/11/2013 1006   EOSABS 0.3 12/14/2013 0829   EOSABS 0.2 01/11/2013 1006   BASOSABS 0.0 12/14/2013 0829   BASOSABS 0.1 01/11/2013 1006        Component Value Date/Time   NA 138 06/09/2014 0956   NA 134* 01/11/2013 1006   K 3.8 06/09/2014 0956   K 4.2 01/11/2013 1006   CL 103 06/09/2014 0956   CO2 24 06/09/2014 0956   CO2 26 01/11/2013 1006   GLUCOSE 132* 06/09/2014 0956   GLUCOSE 130 01/11/2013 1006   GLUCOSE 124* 06/18/2006 0958   BUN 10 06/09/2014 0956   BUN 9.3 01/11/2013 1006   CREATININE 0.8 06/09/2014 0956   CREATININE 0.82 12/14/2013 0829   CREATININE 0.8 01/11/2013 1006   CALCIUM 8.9 06/09/2014 0956   CALCIUM 9.0 01/11/2013 1006   PROT 6.8 06/09/2014 0956   PROT 6.5 01/11/2013 1006   ALBUMIN 3.8 06/09/2014 0956   ALBUMIN 3.2* 01/11/2013 1006   AST 26 06/09/2014 0956   AST 19 01/11/2013 1006   ALT 21 06/09/2014 0956   ALT 19 01/11/2013 1006   ALKPHOS 34* 06/09/2014 0956   ALKPHOS 39* 01/11/2013 1006   BILITOT 0.8 06/09/2014 0956   BILITOT 0.32 01/11/2013 1006   GFRNONAA 65 12/14/2013 0829   GFRNONAA 77* 08/06/2011 1400   GFRAA 75 12/14/2013 0829   GFRAA 90* 08/06/2011 1400       Studies:      CLINICAL DATA: Patient underwent right lumpectomy for breast carcinoma in 2013. No current complaints.  EXAM: DIGITAL DIAGNOSTIC BILATERAL MAMMOGRAM WITH CAD  COMPARISON: Prior exam  ACR Breast Density Category b: There are scattered areas of fibroglandular density.  FINDINGS: Opacity with  architectural distortion in the right breast as slightly retracted when compared to the December 2013 exam. Is similar to the 2014 study consistent with fairly exuberant postoperative  scarring.  There are no discrete masses or other areas of architectural distortion. There are no suspicious calcifications.  Mammographic images were processed with CAD.  IMPRESSION: No evidence of recurrent or new breast malignancy. Benign postsurgical changes on the right.  RECOMMENDATION: Diagnostic mammography in 1 year per standard post lumpectomy protocol.  I have discussed the findings and recommendations with the patient. Results were also provided in writing at the conclusion of the visit. If applicable, a reminder letter will be sent to the patient regarding the next appointment.  BI-RADS CATEGORY 2: Benign Finding(s)   Electronically Signed  By: Lajean Manes M.D.  On: 06/21/2014 16:33    Assessment: 81 y.o.  [place] woman   (1)  status post right lumpectomy 08/08/2011 for a T1c NX invasive ductal carcinoma, grade 1, strongly estrogen and progesterone receptor positive, HER-2 negative, with an MIB-1 of less than 10.  (2)  no adjuvant radiation or chemotherapy.  (3)  started on tamoxifen January 2013, the plan being to continue for total of 5 years (until January 2018).  (4)  Mild anemia, chronic and stable  Plan:  Gabrielle Warren  Is doing fine with her tamoxifen, which she tolerates well and which is very inexpensive 4. The plan is to continue this drug another 2 years. She will see as one year from now and then for one last visit 2 years from now.   Today I reviewed all her medications uncorrected her medication list.   I'm delighted that she is doing so well. She knows to call for any problems that may develop before her next visit here.  Chauncey Cruel, MD  09/05/2014

## 2014-09-05 NOTE — Telephone Encounter (Signed)
Appts made and avs printed for pt  anne

## 2014-10-09 ENCOUNTER — Other Ambulatory Visit: Payer: Self-pay | Admitting: Family Medicine

## 2014-11-04 ENCOUNTER — Other Ambulatory Visit: Payer: Self-pay | Admitting: Family Medicine

## 2014-11-13 ENCOUNTER — Other Ambulatory Visit: Payer: Self-pay | Admitting: Internal Medicine

## 2014-12-06 ENCOUNTER — Other Ambulatory Visit: Payer: Self-pay | Admitting: Cardiology

## 2014-12-07 NOTE — Telephone Encounter (Signed)
°  1. Which medications need to be refilled? Simvastatin  2. Which pharmacy is medication to be sent to? [facility] on [address]  3. Do they need a 30 day or 90 day supply? 90  4. Would they like a call back once the medication has been sent to the pharmacy? Yes

## 2014-12-09 ENCOUNTER — Ambulatory Visit: Payer: Medicare Other | Admitting: Internal Medicine

## 2014-12-13 ENCOUNTER — Ambulatory Visit: Payer: Medicare Other | Admitting: Internal Medicine

## 2014-12-14 ENCOUNTER — Ambulatory Visit (INDEPENDENT_AMBULATORY_CARE_PROVIDER_SITE_OTHER): Payer: Medicare Other | Admitting: Internal Medicine

## 2014-12-14 ENCOUNTER — Ambulatory Visit: Payer: Medicare Other | Admitting: Internal Medicine

## 2014-12-14 ENCOUNTER — Telehealth: Payer: Self-pay | Admitting: Cardiology

## 2014-12-14 ENCOUNTER — Other Ambulatory Visit (INDEPENDENT_AMBULATORY_CARE_PROVIDER_SITE_OTHER): Payer: Medicare Other

## 2014-12-14 ENCOUNTER — Encounter: Payer: Self-pay | Admitting: Internal Medicine

## 2014-12-14 VITALS — BP 158/78 | HR 62 | Temp 97.9°F | Resp 12 | Ht 59.0 in | Wt 101.8 lb

## 2014-12-14 DIAGNOSIS — I10 Essential (primary) hypertension: Secondary | ICD-10-CM | POA: Diagnosis not present

## 2014-12-14 DIAGNOSIS — D630 Anemia in neoplastic disease: Secondary | ICD-10-CM | POA: Diagnosis not present

## 2014-12-14 DIAGNOSIS — D509 Iron deficiency anemia, unspecified: Secondary | ICD-10-CM | POA: Diagnosis not present

## 2014-12-14 LAB — CBC
HCT: 35.5 % — ABNORMAL LOW (ref 36.0–46.0)
HEMOGLOBIN: 11.9 g/dL — AB (ref 12.0–15.0)
MCHC: 33.5 g/dL (ref 30.0–36.0)
MCV: 94.3 fl (ref 78.0–100.0)
Platelets: 196 10*3/uL (ref 150.0–400.0)
RBC: 3.77 Mil/uL — ABNORMAL LOW (ref 3.87–5.11)
RDW: 13 % (ref 11.5–15.5)
WBC: 10.6 10*3/uL — ABNORMAL HIGH (ref 4.0–10.5)

## 2014-12-14 NOTE — Progress Notes (Signed)
Pre visit review using our clinic review tool, if applicable. No additional management support is needed unless otherwise documented below in the visit note. 

## 2014-12-14 NOTE — Patient Instructions (Signed)
We will check your blood work today and call you with the results.   I do not think that the prevagen will be harmful to you but I cannot guarantee that it will help. If you wanted to try it that would be okay.   Come back in about 6 months.

## 2014-12-15 NOTE — Telephone Encounter (Signed)
Close encounter 

## 2014-12-16 NOTE — Progress Notes (Signed)
   Subjective:    Patient ID: Gabrielle Warren, female    DOB: [birth year], 81 y.o.   MRN: [id number]  HPI The patient is an 81 YO female coming in to follow up on her anemia. She has history of cancer and was followed closely for some time. She is now clear and denies any known bleeding. She takes vitamins but not specifically iron. She denies lightheadedness or syncope. No dark stools or black stools. Some balance problems and she thinks that they have gotten gradually worse with time. This has limited her activity which has further limited her feeling of safety. Denies fall with injury.   Review of Systems  Constitutional: Negative for fever, chills, activity change, appetite change and unexpected weight change.  HENT: Negative.   Respiratory: Negative for cough, chest tightness, shortness of breath and wheezing.   Cardiovascular: Negative for chest pain and leg swelling.  Gastrointestinal: Negative.   Musculoskeletal: Positive for gait problem.  Neurological: Positive for weakness. Negative for dizziness and syncope.      Objective:   Physical Exam  Constitutional: She appears well-developed and well-nourished.  HENT:  Head: Normocephalic and atraumatic.  Eyes: EOM are normal.  Neck: Normal range of motion.  Cardiovascular: Normal rate.   Pulmonary/Chest: Effort normal and breath sounds normal.  Abdominal: Soft. Bowel sounds are normal.  Neurological: She is alert.  Slow to stand but steady gait  Skin: Skin is warm and dry.   Filed Vitals:   12/14/14 1047  BP: 158/78  Pulse: 62  Temp: 97.9 F (36.6 C)  TempSrc: Oral  Resp: 12  Height: 4\' 11"  (1.499 m)  Weight: 101 lb 12.8 oz (46.176 kg)  SpO2: 97%      Assessment & Plan:

## 2014-12-16 NOTE — Assessment & Plan Note (Signed)
BP close to goal with clonidine, diltiazem, metoprolol, losartan. Would be concerned about side effects from adding additional agent and she is close to goal. Reviewed last BMP stable.

## 2014-12-16 NOTE — Assessment & Plan Note (Signed)
Check CBC today, no overt signs of bleeding and last stable. Continue her multivitamin.

## 2015-01-12 ENCOUNTER — Ambulatory Visit (INDEPENDENT_AMBULATORY_CARE_PROVIDER_SITE_OTHER): Payer: Medicare Other | Admitting: Cardiology

## 2015-01-12 ENCOUNTER — Encounter: Payer: Self-pay | Admitting: Cardiology

## 2015-01-12 VITALS — BP 120/54 | HR 72 | Ht 59.0 in | Wt 105.0 lb

## 2015-01-12 DIAGNOSIS — I10 Essential (primary) hypertension: Secondary | ICD-10-CM | POA: Diagnosis not present

## 2015-01-12 NOTE — Patient Instructions (Signed)
Your physician wants you to follow-up in: 1 Year. You will receive a reminder letter in the mail two months in advance. If you don't receive a letter, please call our office to schedule the follow-up appointment.  

## 2015-01-12 NOTE — Progress Notes (Signed)
HPI The patient presents for evaluation of hypertension.  Since I last saw her her blood pressures have been excellent. She hasn't had taken clonidine. She keeps a diary and I reviewed this again today. Her blood pressures fluctuate but they are never sustained much above 160 for very long. She's had some back problems which has limited her but otherwise does well. She denies any chest pressure, neck or arm discomfort. She's not had any palpitations, presyncope or syncope. She has had no PND or orthopnea. She's had no weight gain or edema.  Allergies  Allergen Reactions  . Fosamax [Alendronate Sodium] Other (See Comments)    Canker sores and bleeding gums    Current Outpatient Prescriptions  Medication Sig Dispense Refill  . aspirin 81 MG tablet Take 81 mg by mouth daily.      . cholecalciferol (VITAMIN D) 1000 UNITS tablet 800 Units. 2 po qd with calcium 600 mg    . cloNIDine (CATAPRES) 0.1 MG tablet Take 1/2 tablet daily if B/P sustains greater than 539 systolic 30 tablet 6  . diazepam (VALIUM) 10 MG tablet Take 0.5 tablets (5 mg total) by mouth 2 (two) times daily as needed for anxiety. 45 tablet 3  . diltiazem (CARDIZEM CD) 120 MG 24 hr capsule take 1 capsule by mouth once daily 90 capsule 3  . Ferrous Sulfate (IRON) 90 (18 FE) MG TABS Take 2 tablets by mouth daily.      . fish oil-omega-3 fatty acids 1000 MG capsule Take 2 g by mouth daily. 1200 mg    . losartan (COZAAR) 100 MG tablet take 1 tablet by mouth once daily 90 tablet 0  . metoprolol succinate (TOPROL-XL) 25 MG 24 hr tablet Take 1 tablet (25 mg total) by mouth 2 (two) times daily. 180 tablet 3  . mometasone (NASONEX) 50 MCG/ACT nasal spray 2 sprays each nares q day (Patient taking differently: Place 2 sprays into the nose as needed. 2 sprays each nares q day) 17 g 0  . Multiple Vitamin (MULTIVITAMIN) tablet Take 1 tablet by mouth daily.      . prazosin (MINIPRESS) 1 MG capsule take 2 capsules by mouth every morning and 1  capsule every evening and 2 capsules at bedtime 450 capsule 3  . simvastatin (ZOCOR) 20 MG tablet take 1 tablet by mouth once daily 90 tablet 0  . tamoxifen (NOLVADEX) 20 MG tablet take 1 tablet by mouth once daily 90 tablet 3  . traMADol (ULTRAM) 50 MG tablet Take 1 tablet (50 mg total) by mouth every 8 (eight) hours as needed for pain. 30 tablet 2  . Vitamin D, Ergocalciferol, (DRISDOL) 50000 UNITS CAPS capsule Take 1 capsule (50,000 Units total) by mouth once a week. 4 capsule 5   No current facility-administered medications for this visit.    Past Medical History  Diagnosis Date  . HYPERTENSION 07/25/2006  . ANXIETY 09/23/2007  . HYPERLIPIDEMIA 07/03/2009  . Atrial tachycardia   . MVP (mitral valve prolapse)   . Arthritis   . Osteoarthritis   . Breast cancer   . Anemia 12/10/2011  . Osteoporosis     Past Surgical History  Procedure Laterality Date  . Catart extraction  04/2000, 03/2002  . Carpal tunnel release  09/2004  . Breast surgery  08/08/11    lumpectomy   ROS  As stated in the HPI and negative for all other systems.  PHYSICAL EXAM BP 120/54 mmHg  Pulse 72  Ht 4\' 11"  (1.499 m)  Wt 105 lb (47.628 kg)  BMI 21.20 kg/m2 GENERAL:  Well appearing, looks younger than stated age  NECK:  No jugular venous distention, waveform within normal limits, carotid upstroke brisk and symmetric, no bruits, no thyromegaly LUNGS:  Clear to auscultation bilaterally CHEST:  Unremarkable HEART:  PMI not displaced or sustained,S1 and S2 within normal limits, no S3, no S4, no clicks, no rubs, no murmurs ABD:  Flat, positive bowel sounds normal in frequency in pitch, no bruits, no rebound, no guarding, no midline pulsatile mass, no hepatomegaly, no splenomegaly EXT:  2 plus pulses throughout, no edema, no cyanosis no clubbing  EKG:  Sinus rhythm, rate 72, axis within normal limits, intervals within normal limits, sinus arrhythmia. 01/12/2015   ASSESSMENT AND PLAN  HYPERTENSION -  Patient's  blood pressure is well controlled. No change in therapy is indicated. Continue the meds as listed.  PALPITATIONS -  She is not particularly bothered by this.  She will continue on the meds as listed

## 2015-02-08 ENCOUNTER — Telehealth: Payer: Self-pay | Admitting: *Deleted

## 2015-02-08 MED ORDER — LOSARTAN POTASSIUM 100 MG PO TABS: 100.0000 mg | ORAL_TABLET | Freq: Every day | ORAL | Status: DC

## 2015-02-08 NOTE — Telephone Encounter (Signed)
Received call pt states pharmacy has been trying to get her losartan filled since last week. She took her last pill today. Requesting refill. Inform [facility] to [facility]. Rx sent electronically...Johny Chess

## 2015-02-17 ENCOUNTER — Other Ambulatory Visit: Payer: Self-pay | Admitting: Internal Medicine

## 2015-02-20 NOTE — Telephone Encounter (Signed)
Ph[facility] in to [facility]

## 2015-02-20 NOTE — Telephone Encounter (Signed)
Please make sure this has been printed, signed, and patient notified, thanks.

## 2015-03-03 ENCOUNTER — Other Ambulatory Visit: Payer: Self-pay | Admitting: Cardiology

## 2015-03-09 ENCOUNTER — Telehealth: Payer: Self-pay | Admitting: *Deleted

## 2015-03-09 MED ORDER — SIMVASTATIN 20 MG PO TABS: 20.0000 mg | ORAL_TABLET | Freq: Every day | ORAL | Status: DC

## 2015-03-09 NOTE — Telephone Encounter (Signed)
Left msg on triage needing refill on her simvastatin...Johny Chess

## 2015-04-03 ENCOUNTER — Other Ambulatory Visit: Payer: Self-pay | Admitting: Internal Medicine

## 2015-04-03 ENCOUNTER — Telehealth: Payer: Self-pay | Admitting: Internal Medicine

## 2015-04-03 MED ORDER — VITAMIN D (ERGOCALCIFEROL) 1.25 MG (50000 UNIT) PO CAPS: 50000.0000 [IU] | ORAL_CAPSULE | ORAL | Status: DC

## 2015-04-03 NOTE — Telephone Encounter (Signed)
Verified [facility] on [address]. Patient requests a refill of Vitamin D, Ergocalciferol, (DRISDOL) 50000 UNITS CAPS capsule [[id number]].

## 2015-04-03 NOTE — Telephone Encounter (Signed)
[facility]ill [facility] to [facility].../lmb 

## 2015-04-26 ENCOUNTER — Encounter: Payer: Self-pay | Admitting: Internal Medicine

## 2015-05-01 ENCOUNTER — Other Ambulatory Visit: Payer: Self-pay | Admitting: Oncology

## 2015-05-01 ENCOUNTER — Other Ambulatory Visit: Payer: Self-pay | Admitting: Internal Medicine

## 2015-05-01 DIAGNOSIS — Z853 Personal history of malignant neoplasm of breast: Secondary | ICD-10-CM

## 2015-05-01 DIAGNOSIS — Z9889 Other specified postprocedural states: Secondary | ICD-10-CM

## 2015-05-11 ENCOUNTER — Ambulatory Visit (INDEPENDENT_AMBULATORY_CARE_PROVIDER_SITE_OTHER): Payer: Medicare Other | Admitting: Internal Medicine

## 2015-05-11 ENCOUNTER — Telehealth: Payer: Self-pay | Admitting: Internal Medicine

## 2015-05-11 ENCOUNTER — Encounter: Payer: Self-pay | Admitting: Internal Medicine

## 2015-05-11 VITALS — BP 136/68 | HR 66 | Temp 98.8°F | Ht 59.0 in | Wt 108.0 lb

## 2015-05-11 DIAGNOSIS — K5909 Other constipation: Secondary | ICD-10-CM

## 2015-05-11 NOTE — Telephone Encounter (Signed)
Dr. Sharlet Salina,  This pa`tient is requesting to switch to dr burns. She states that she didn't feel as comfortable with you as she'd like. Is this ok with you?   Dr. Quay Burow,  Are you ok with accepting this patient into your panel?

## 2015-05-11 NOTE — Telephone Encounter (Signed)
Fine with me

## 2015-05-11 NOTE — Patient Instructions (Signed)
Natural interventions to treat or prevent constipation would include drinking to thirst, up to 32 ounces of fluids daily; eating 7-9 servings of fresh fruits or vegetables a day; and increasing roughage in the diet such as whole grains. The OTC  fiber products (Example: Metamucil, etc) can be employed if these natural maneuvers do not correct the issue. Finally MiraLax every third day as needed is an option.  Please take a probiotic , Florastor OR Align, every day if the bowels are loose. This will replace the normal bacteria which  are necessary for formation of normal stool and processing of food.

## 2015-05-11 NOTE — Progress Notes (Signed)
Pre visit review using our clinic review tool, if applicable. No additional management support is needed unless otherwise documented below in the visit note. 

## 2015-05-11 NOTE — Progress Notes (Signed)
   Subjective:    Patient ID: Gabrielle Warren, female    DOB: October 09, 1925, 81 y.o.   MRN: [id number]  HPI  She has had constipation for at least 3 decades which has been progressive, especially the last several months. She has taken milk of magnesia and then MiraLAX. With the constipation she has lower abdominal pain and bloating. Rarely will she have a formed stool. Typically the stools are loose.  She describes decreased appetite for several months.    Her last TSH was 1.82 in 2012 Review of Systems She denies unexplained weight loss,  significant dyspepsia, dysphagia, melena, rectal bleeding, or persistently small caliber stools.     Objective:   Physical Exam Pertinent or positive findings include: she appears much younger than her stated age. Thyroid is small to palpation. First heart sound is accentuated. Bowel sounds are hyperactive. She has mixed PIP/DIP and DIP arthritic changes of the hands.  General appearance :adequately nourished; in no distress.  Eyes: No conjunctival inflammation or scleral icterus is present.  Oral exam:  Lips and gums are healthy appearing.There is no oropharyngeal erythema or exudate noted. Dental hygiene is good.  Heart:  Normal rate and regular rhythm.  S2 normal without gallop, murmur, click, rub or other extra sounds    Lungs:Chest clear to auscultation; no wheezes, rhonchi,rales ,or rubs present.No increased work of breathing.   Abdomen: soft and non-tender without masses, organomegaly or hernias noted.  No guarding or rebound.   Vascular : all pulses equal ; no bruits present.  Skin:Warm & dry.  Intact without suspicious lesions or rashes ; no tenting or jaundice   Lymphatic: No lymphadenopathy is noted about the head, neck, axilla.   Neuro: Strength, tone & DTRs normal.      Assessment & Plan:  #1 chronic constipation  See orders & AVS

## 2015-05-11 NOTE — Telephone Encounter (Signed)
ok 

## 2015-05-15 ENCOUNTER — Other Ambulatory Visit: Payer: Self-pay | Admitting: Internal Medicine

## 2015-05-15 NOTE — Telephone Encounter (Signed)
Please advise, thanks.

## 2015-05-15 NOTE — Telephone Encounter (Signed)
Valium faxed to [facility].

## 2015-05-17 ENCOUNTER — Encounter: Payer: Self-pay | Admitting: Internal Medicine

## 2015-05-30 ENCOUNTER — Other Ambulatory Visit: Payer: Self-pay | Admitting: Internal Medicine

## 2015-06-05 ENCOUNTER — Telehealth: Payer: Self-pay | Admitting: *Deleted

## 2015-06-05 MED ORDER — SIMVASTATIN 20 MG PO TABS: 20.0000 mg | ORAL_TABLET | Freq: Every day | ORAL | Status: DC

## 2015-06-05 NOTE — Telephone Encounter (Signed)
Received call pt states she is needing refills sent on her simvastatin. Verified pharmacy inform will send 30 day to [facility] pt is due for yearly CPX in Dec..../lmb

## 2015-06-15 ENCOUNTER — Ambulatory Visit: Payer: Federal, State, Local not specified - PPO | Admitting: Internal Medicine

## 2015-06-15 ENCOUNTER — Encounter: Payer: Self-pay | Admitting: Internal Medicine

## 2015-06-15 ENCOUNTER — Ambulatory Visit (INDEPENDENT_AMBULATORY_CARE_PROVIDER_SITE_OTHER): Payer: Medicare Other | Admitting: Internal Medicine

## 2015-06-15 ENCOUNTER — Other Ambulatory Visit (INDEPENDENT_AMBULATORY_CARE_PROVIDER_SITE_OTHER): Payer: Medicare Other

## 2015-06-15 VITALS — BP 144/66 | HR 77 | Temp 98.2°F | Resp 16 | Wt 108.0 lb

## 2015-06-15 DIAGNOSIS — I10 Essential (primary) hypertension: Secondary | ICD-10-CM | POA: Diagnosis not present

## 2015-06-15 DIAGNOSIS — K5909 Other constipation: Secondary | ICD-10-CM

## 2015-06-15 DIAGNOSIS — I2510 Atherosclerotic heart disease of native coronary artery without angina pectoris: Secondary | ICD-10-CM | POA: Diagnosis not present

## 2015-06-15 DIAGNOSIS — D630 Anemia in neoplastic disease: Secondary | ICD-10-CM

## 2015-06-15 DIAGNOSIS — F0390 Unspecified dementia without behavioral disturbance: Secondary | ICD-10-CM | POA: Insufficient documentation

## 2015-06-15 DIAGNOSIS — E785 Hyperlipidemia, unspecified: Secondary | ICD-10-CM

## 2015-06-15 DIAGNOSIS — R7303 Prediabetes: Secondary | ICD-10-CM

## 2015-06-15 DIAGNOSIS — H3530 Unspecified macular degeneration: Secondary | ICD-10-CM | POA: Diagnosis not present

## 2015-06-15 DIAGNOSIS — R739 Hyperglycemia, unspecified: Secondary | ICD-10-CM

## 2015-06-15 DIAGNOSIS — F419 Anxiety disorder, unspecified: Secondary | ICD-10-CM

## 2015-06-15 DIAGNOSIS — R413 Other amnesia: Secondary | ICD-10-CM | POA: Insufficient documentation

## 2015-06-15 DIAGNOSIS — M5441 Lumbago with sciatica, right side: Secondary | ICD-10-CM

## 2015-06-15 DIAGNOSIS — M549 Dorsalgia, unspecified: Secondary | ICD-10-CM | POA: Insufficient documentation

## 2015-06-15 HISTORY — DX: Prediabetes: R73.03

## 2015-06-15 LAB — CBC WITH DIFFERENTIAL/PLATELET
BASOS PCT: 0.5 % (ref 0.0–3.0)
Basophils Absolute: 0 10*3/uL (ref 0.0–0.1)
EOS ABS: 0.1 10*3/uL (ref 0.0–0.7)
EOS PCT: 1.2 % (ref 0.0–5.0)
HEMATOCRIT: 35.9 % — AB (ref 36.0–46.0)
HEMOGLOBIN: 11.9 g/dL — AB (ref 12.0–15.0)
LYMPHS PCT: 21 % (ref 12.0–46.0)
Lymphs Abs: 1.9 10*3/uL (ref 0.7–4.0)
MCHC: 33.1 g/dL (ref 30.0–36.0)
MCV: 94.9 fl (ref 78.0–100.0)
Monocytes Absolute: 1 10*3/uL (ref 0.1–1.0)
Monocytes Relative: 11.5 % (ref 3.0–12.0)
Neutro Abs: 5.8 10*3/uL (ref 1.4–7.7)
Neutrophils Relative %: 65.8 % (ref 43.0–77.0)
Platelets: 195 10*3/uL (ref 150.0–400.0)
RBC: 3.78 Mil/uL — ABNORMAL LOW (ref 3.87–5.11)
RDW: 12.7 % (ref 11.5–15.5)
WBC: 8.8 10*3/uL (ref 4.0–10.5)

## 2015-06-15 LAB — COMPREHENSIVE METABOLIC PANEL
ALBUMIN: 3.8 g/dL (ref 3.5–5.2)
ALT: 17 U/L (ref 0–35)
AST: 20 U/L (ref 0–37)
Alkaline Phosphatase: 39 U/L (ref 39–117)
BUN: 11 mg/dL (ref 6–23)
CALCIUM: 9.8 mg/dL (ref 8.4–10.5)
CHLORIDE: 103 meq/L (ref 96–112)
CO2: 29 mEq/L (ref 19–32)
Creatinine, Ser: 0.91 mg/dL (ref 0.40–1.20)
GFR: 62.15 mL/min (ref >60.00)
Glucose, Bld: 125 mg/dL — ABNORMAL HIGH (ref 70–99)
POTASSIUM: 4.1 meq/L (ref 3.5–5.1)
Sodium: 140 mEq/L (ref 135–145)
Total Bilirubin: 0.6 mg/dL (ref 0.2–1.2)
Total Protein: 7 g/dL (ref 6.0–8.3)

## 2015-06-15 LAB — LIPID PANEL
CHOLESTEROL: 127 mg/dL (ref 0–200)
HDL: 58.4 mg/dL (ref >39.00)
LDL CALC: 50 mg/dL (ref 0–99)
NonHDL: 68.43
TRIGLYCERIDES: 92 mg/dL (ref 0.0–149.0)
Total CHOL/HDL Ratio: 2
VLDL: 18.4 mg/dL (ref 0.0–40.0)

## 2015-06-15 LAB — IRON: IRON: 118 ug/dL (ref 42–145)

## 2015-06-15 LAB — VITAMIN B12: VITAMIN B 12: 594 pg/mL (ref 211–911)

## 2015-06-15 LAB — FERRITIN: FERRITIN: 247.2 ng/mL (ref 10.0–291.0)

## 2015-06-15 LAB — TSH: TSH: 2.21 u[IU]/mL (ref 0.35–4.50)

## 2015-06-15 LAB — HEMOGLOBIN A1C: Hgb A1c MFr Bld: 5.9 % (ref 4.6–6.5)

## 2015-06-15 NOTE — Assessment & Plan Note (Signed)
BP Readings from Last 3 Encounters:  06/15/15 144/66  05/11/15 136/68  01/12/15 120/54   BP overall controlled Continue current medications

## 2015-06-15 NOTE — Assessment & Plan Note (Signed)
Chronic Takes miralax daily Also takes magnesium Constipation fairly controlled, occasionally uses an enema

## 2015-06-15 NOTE — Progress Notes (Signed)
Pre visit review using our clinic review tool, if applicable. No additional management support is needed unless otherwise documented below in the visit note. 

## 2015-06-15 NOTE — Assessment & Plan Note (Signed)
Check TSH, vitamin B12, basic blood work [employer] refer to neurology for further evaluation

## 2015-06-15 NOTE — Assessment & Plan Note (Signed)
Taking simvastatin 20 mg daily Check lipid panel, CMP today

## 2015-06-15 NOTE — Progress Notes (Addendum)
Subjective:    Patient ID: Gabrielle Warren, female    DOB: July 30, 1925, 81 y.o.   MRN: [id number]  HPI She is here to establish with a new pcp.   Hypertension: She is taking her medication daily. She is compliant with a low sodium diet.  She denies chest pain, shortness of breath and regular headaches. She has intermittent palpitations and occasional leg edema.  She is exercising regularly, but it is limited exercise due to her back pain.  She exercises more in the warmer weather.    Hyperlipidemia: She is taking her medication daily. She is compliant with a low fat/cholesterol diet. She is exercising regularly. She denies myalgias.   Anxiety: She takes 1/2 valium in morning and 1/2 in evening.  Her anxiety is well controlled. She has been on medication for years and denies any side effects.  History of Breast cancer:  She was diagnosed in Dec of 2012.  She had partial mastectomy in January 2013.   She is taking tamoxifen and is tolerating the medications without side effects. She follows with oncology.  Coronary artery disease: She was told that she had a heart attack years ago. Was seen on an EKG. She follows with cardiology. She denies any chest pain.  Back pain: She has chronic back pain and has seen neurosurgeon or orthopedic. She had an MRI 11 over the summer and is not a surgical candidate. She does have pain on a daily basis. Rare occasion she has taken tramadol, but does not take it often.  Memory concerns: She does state some concerns with her memory. Most recent he says it's related to recall the names. She typically is able to recall ever she can't remember after a few minutes. She lives with her daughter. She is independent in her ADLs. She doesn't manage her finances. She does seem to have difficulty recalling some history and coming up with words during her visit.  Medications and allergies reviewed with patient and updated if appropriate.  Patient Active Problem List     Diagnosis Date Noted  . Macular degeneration 06/15/2015  . Breast cancer, right breast ([place]) 09/05/2014  . Allergic rhinitis 02/01/2014  . Anemia in neoplastic disease 09/22/2013  . Invasive ductal carcinoma of breast, stage 1 ([place]) 07/18/2011  . CAD (coronary artery disease) 04/26/2011  . CONSTIPATION, CHRONIC 08/21/2009  . HYPERLIPIDEMIA 07/03/2009  . BRUIT 07/21/2008  . ANXIETY 09/23/2007  . OSTEOARTHRITIS 09/23/2007  . OTHER OSTEOPOROSIS 09/23/2007  . Essential hypertension 07/25/2006    Current Outpatient Prescriptions on File Prior to Visit  Medication Sig Dispense Refill  . aspirin 81 MG tablet Take 81 mg by mouth daily.      . cloNIDine (CATAPRES) 0.1 MG tablet Take 1/2 tablet daily if B/P sustains greater than 99991111 systolic 30 tablet 6  . diazepam (VALIUM) 10 MG tablet take 1 tablet by mouth twice a day if needed for anxiety 45 tablet 0  . diltiazem (CARDIZEM CD) 120 MG 24 hr capsule take 1 capsule by mouth once daily 90 capsule 3  . Ferrous Sulfate (IRON) 90 (18 FE) MG TABS Take 2 tablets by mouth daily.      . fish oil-omega-3 fatty acids 1000 MG capsule Take 2 g by mouth daily. 1200 mg    . losartan (COZAAR) 100 MG tablet Take 1 tablet (100 mg total) by mouth daily. 90 tablet 1  . metoprolol succinate (TOPROL-XL) 25 MG 24 hr tablet Take 1 tablet (25 mg total) by  mouth 2 (two) times daily. 180 tablet 3  . mometasone (NASONEX) 50 MCG/ACT nasal spray 2 sprays each nares q day (Patient taking differently: Place 2 sprays into the nose as needed. 2 sprays each nares q day) 17 g 0  . Multiple Vitamin (MULTIVITAMIN) tablet Take 1 tablet by mouth daily.      . multivitamin-lutein (OCUVITE-LUTEIN) CAPS capsule Take 1 capsule by mouth daily.    . prazosin (MINIPRESS) 1 MG capsule take 2 capsules by mouth every morning and 1 capsule every evening and 2 capsules at bedtime 450 capsule 3  . simvastatin (ZOCOR) 20 MG tablet Take 1 tablet (20 mg total) by mouth daily. 30 tablet 0  .  tamoxifen (NOLVADEX) 20 MG tablet take 1 tablet by mouth once daily 90 tablet 3  . traMADol (ULTRAM) 50 MG tablet Take 1 tablet (50 mg total) by mouth every 8 (eight) hours as needed for pain. 30 tablet 2  . Vitamin D, Ergocalciferol, (DRISDOL) 50000 UNITS CAPS capsule Take 1 capsule (50,000 Units total) by mouth once a week. 4 capsule 2   No current facility-administered medications on file prior to visit.    Past Medical History  Diagnosis Date  . HYPERTENSION 07/25/2006  . ANXIETY 09/23/2007  . HYPERLIPIDEMIA 07/03/2009  . Atrial tachycardia ([place])   . MVP (mitral valve prolapse)   . Arthritis   . Osteoarthritis   . Breast cancer ([place])   . Anemia 12/10/2011  . Osteoporosis     Past Surgical History  Procedure Laterality Date  . Cataract extraction  04/2000, 03/2002  . Carpal tunnel release  09/2004  . Breast surgery  08/08/11    lumpectomy    Social History   Social History  . Marital Status: Widowed    Spouse Name: N/A  . Number of Children: N/A  . Years of Education: N/A   Social History Main Topics  . Smoking status: Former Smoker    Quit date: 07/08/1994  . Smokeless tobacco: Never Used  . Alcohol Use: Yes  . Drug Use: No  . Sexual Activity:    Partners: Male   Other Topics Concern  . None   Social History Narrative   Exercise--walking some, more in warmer weather    Review of Systems  Constitutional: Negative for fever and chills.  HENT: Positive for sinus pressure and voice change.   Respiratory: Negative for cough, shortness of breath and wheezing.   Cardiovascular: Positive for palpitations and leg swelling (occasional). Negative for chest pain.  Musculoskeletal: Positive for back pain.  Neurological: Positive for numbness (toes, feet). Negative for dizziness, light-headedness and headaches.       No balance problems  Psychiatric/Behavioral: Negative for dysphoric mood. The patient is nervous/anxious.        Objective:   Filed Vitals:   06/15/15  1115  BP: 144/66  Pulse: 77  Temp: 98.2 F (36.8 C)  Resp: 16   Filed Weights   06/15/15 1115  Weight: 108 lb (48.988 kg)   Body mass index is 21.8 kg/(m^2).   Physical Exam Constitutional: Appears well-developed and well-nourished. No distress.  Neck: Neck supple. No tracheal deviation present. No thyromegaly present.  No carotid bruit. No cervical adenopathy.   Cardiovascular: Normal rate, regular rhythm and normal heart sounds.   2/6 systolic murmur Pulmonary/Chest: Effort normal and breath sounds normal. No respiratory distress. No wheezes.  Musculoskeletal: No edema.  Psych: Normal mood and affect, alert and orientated 3, some difficulty with recall  Assessment & Plan:   See follows with a gynecologist - she has a pesary in place and follows up every 6 months  See follows with cardiology, orthopedics and her eye doctor.    See problem list for A/P

## 2015-06-15 NOTE — Assessment & Plan Note (Signed)
Chronic back pain Not a surgical candidate Continue regular walking

## 2015-06-15 NOTE — Patient Instructions (Signed)
  We have reviewed your prior records including labs and tests today.  Test(s) ordered today. Your results will be released to [place] (or called to you) after review, usually within 72hours after test completion. If any changes need to be made, you will be notified at that same time.   Medications reviewed and updated.  No changes recommended at this time.   A referral was ordered for neurology.  Please schedule followup in 6 months

## 2015-06-15 NOTE — Assessment & Plan Note (Signed)
Taking half of the Valium twice daily. Anxiety stable and controlled She is monogamous medication for years and has not tolerated it well She has been reluctant to try an SSRI in the past. She did try sertraline in the past and did not like how it made her feel

## 2015-06-15 NOTE — Assessment & Plan Note (Signed)
Asymptomatic Per patient she had an MI years ago Following with cardiology Continue current medications

## 2015-06-15 NOTE — Assessment & Plan Note (Signed)
Will check A1c Continue regular exercise

## 2015-06-23 ENCOUNTER — Ambulatory Visit
Admission: RE | Admit: 2015-06-23 | Discharge: 2015-06-23 | Disposition: A | Discharge status: Home / Self Care | Payer: Medicare Other | Source: Ambulatory Visit | Attending: Oncology | Admitting: Oncology

## 2015-06-23 DIAGNOSIS — Z853 Personal history of malignant neoplasm of breast: Secondary | ICD-10-CM

## 2015-06-23 DIAGNOSIS — Z9889 Other specified postprocedural states: Secondary | ICD-10-CM

## 2015-06-26 ENCOUNTER — Telehealth: Payer: Self-pay | Admitting: *Deleted

## 2015-06-26 MED ORDER — VITAMIN D (ERGOCALCIFEROL) 1.25 MG (50000 UNIT) PO CAPS: 50000.0000 [IU] | ORAL_CAPSULE | ORAL | Status: DC

## 2015-06-26 MED ORDER — SIMVASTATIN 20 MG PO TABS: 20.0000 mg | ORAL_TABLET | Freq: Every day | ORAL | Status: DC

## 2015-06-26 MED ORDER — DIAZEPAM 10 MG PO TABS: ORAL_TABLET | ORAL | Status: DC

## 2015-06-26 NOTE — Telephone Encounter (Signed)
Received call pt states she is needing refills on her simvastatin, Vit D, and Diazepam. Sent maintenance pls advise on diazepam.../lmb

## 2015-06-26 NOTE — Telephone Encounter (Signed)
Notified pt refills sent to [facility].../lmb 

## 2015-06-26 NOTE — Telephone Encounter (Signed)
rx printed

## 2015-06-29 ENCOUNTER — Ambulatory Visit (INDEPENDENT_AMBULATORY_CARE_PROVIDER_SITE_OTHER): Payer: Medicare Other

## 2015-06-29 ENCOUNTER — Ambulatory Visit (INDEPENDENT_AMBULATORY_CARE_PROVIDER_SITE_OTHER): Payer: Medicare Other | Admitting: Physician Assistant

## 2015-06-29 ENCOUNTER — Encounter: Payer: Self-pay | Admitting: Physician Assistant

## 2015-06-29 VITALS — BP 138/63 | HR 69 | Temp 98.4°F | Resp 16 | Ht <= 58 in | Wt 111.0 lb

## 2015-06-29 DIAGNOSIS — K5900 Constipation, unspecified: Secondary | ICD-10-CM

## 2015-06-29 DIAGNOSIS — D649 Anemia, unspecified: Secondary | ICD-10-CM

## 2015-06-29 DIAGNOSIS — I2510 Atherosclerotic heart disease of native coronary artery without angina pectoris: Secondary | ICD-10-CM | POA: Diagnosis not present

## 2015-06-29 LAB — COMPREHENSIVE METABOLIC PANEL
ALBUMIN: 3.7 g/dL (ref 3.6–5.1)
ALK PHOS: 39 U/L (ref 33–130)
ALT: 22 U/L (ref 6–29)
AST: 27 U/L (ref 10–35)
BILIRUBIN TOTAL: 0.5 mg/dL (ref 0.2–1.2)
BUN: 14 mg/dL (ref 7–25)
CO2: 26 mmol/L (ref 20–31)
CREATININE: 1.23 mg/dL — AB (ref 0.60–0.88)
Calcium: 8.9 mg/dL (ref 8.6–10.4)
Chloride: 90 mmol/L — ABNORMAL LOW (ref 98–110)
Glucose, Bld: 97 mg/dL (ref 65–99)
Potassium: 4.2 mmol/L (ref 3.5–5.3)
SODIUM: 125 mmol/L — AB (ref 135–146)
TOTAL PROTEIN: 6.4 g/dL (ref 6.1–8.1)

## 2015-06-29 LAB — POCT CBC
Granulocyte percent: 65.2 %G (ref 37–80)
HEMATOCRIT: 30.6 % — AB (ref 37.7–47.9)
HEMOGLOBIN: 10.6 g/dL — AB (ref 12.2–16.2)
LYMPH, POC: 2.7 (ref 0.6–3.4)
MCH, POC: 31.5 pg — AB (ref 27–31.2)
MCHC: 34.6 g/dL (ref 31.8–35.4)
MCV: 91 fL (ref 80–97)
MID (cbc): 0.3 (ref 0–0.9)
MPV: 6.7 fL (ref 0–99.8)
POC GRANULOCYTE: 5.6 (ref 2–6.9)
POC LYMPH %: 31.4 % (ref 10–50)
POC MID %: 3.4 % (ref 0–12)
Platelet Count, POC: 176 10*3/uL (ref 142–424)
RBC: 3.36 M/uL — AB (ref 4.04–5.48)
RDW, POC: 12.1 %
WBC: 8.6 10*3/uL (ref 4.6–10.2)

## 2015-06-29 NOTE — Patient Instructions (Signed)
For constipation   Make sure you are drinking enough water daily. Make sure you are getting enough fiber in your diet - this will make you regular - you can eat high fiber foods or use metamucil as a supplement - it is really important to drink enough water when using fiber supplements.  If your stools are hard or are formed balls or you have to strain a stool softener will help - use colace 2-3 capsule a day  For gentle treatment of constipation Use Miralax 1-2 capfuls a day until your stools are soft and regular and then decrease the usage - you can use this daily  For more aggressive treatment of constipation Use 4 capfuls of Colace and 6 doses of Miralax and drink it in 2 hours - this should result in several watery stools - if it does not repeat the next day and then go to daily miralax for a week to make sure your bowels are clean and retrained to work properly  For the most aggressive treatment of constipation Use 14 capfuls of Miralax in 1 gallon of fluid (gatoraid or water work well or a combination of the two) and drink over 12h - it is ok to eat during this time and then use Miralax 1 capful daily for about 2 weeks to prevent the constipation from returning  You may need to use miralax daily to prevent constipation. If you continue to have problems I can refer you to GI.

## 2015-06-29 NOTE — Progress Notes (Signed)
[facility] [address], [place] [address] [phone]  Date:  06/29/2015   Name:  Gabrielle Warren   DOB:  October 07, 1925   MRN:  [id number]  PCP:  Binnie Rail, MD    Chief Complaint: Abdominal Pain; Constipation; and Bloated   History of Present Illness:  This is a 81 y.o. female with PMH HLD, HTN, prediabetes, chronic constipation, hx breast cancer, macular degeneration who is presenting with abdominal pain, bloating and constipation over the past 2 weeks. She states over the past 4 days symptoms have worsened. She last had a very small BM 4 days ago. She has not passed gas in almost 24 hours. She has been belching and felt a little nauseated this morning. She has decreased appetite. She has never had a bowel obstruction. She has never had any abdominal surgeries. Denies bloody stool, fever, chills, vomiting, difficulty urinating or decreased urine. No weight loss. She has never had a colonoscopy. She has had worsening problems with constipation over the past 3 months. When she has stools they are usually loose. She was trying milk of magnesia "a couple swigs a day" and metamucil. She does not take miralax regularly. She tried an enema yesterday without much success.  Saw GYN, Dr. Creed Copper Monday - placed on septra for UTI and gave fluconazole for yeast infection. Went back to [facility] yesterday to get vaginal ring removed - was placed for bladder prolapse. She was having some discomfort with that.  She is seen annually for her breast cancer in remission.  Review of Systems:  Review of Systems See HPI  Patient Active Problem List   Diagnosis Date Noted  . Macular degeneration 06/15/2015  . Back pain 06/15/2015  . Hyperglycemia 06/15/2015  . Memory difficulties 06/15/2015  . Prediabetes 06/15/2015  . Breast cancer, right breast ([place]) 09/05/2014  . Allergic rhinitis 02/01/2014  . Anemia in neoplastic disease 09/22/2013  . Invasive ductal carcinoma  of breast, stage 1 ([place]) 07/18/2011  . CAD (coronary artery disease) 04/26/2011  . CONSTIPATION, CHRONIC 08/21/2009  . Hyperlipidemia 07/03/2009  . BRUIT 07/21/2008  . Anxiety 09/23/2007  . OSTEOARTHRITIS 09/23/2007  . OTHER OSTEOPOROSIS 09/23/2007  . Essential hypertension 07/25/2006    Prior to Admission medications   Medication Sig Start Date End Date Taking? Authorizing Provider  aspirin 81 MG tablet Take 81 mg by mouth daily.     Yes Historical Provider, MD  Calcium Carbonate-Vit D-Min (GNP CALCIUM 1200) 1200-1000 MG-UNIT CHEW Chew 1 tablet by mouth daily.   Yes Historical Provider, MD  cloNIDine (CATAPRES) 0.1 MG tablet Take 1/2 tablet daily if B/P sustains greater than 99991111 systolic [id number]  Yes Minus Breeding, MD  diazepam (VALIUM) 10 MG tablet take 1 tablet by mouth twice a day if needed for anxiety 06/26/15  Yes Binnie Rail, MD  diltiazem (CARDIZEM CD) 120 MG 24 hr capsule take 1 capsule by mouth once daily 05/30/15  Yes Hoyt Koch, MD  Ferrous Sulfate (IRON) 90 (18 FE) MG TABS Take 2 tablets by mouth daily.     Yes Historical Provider, MD  fish oil-omega-3 fatty acids 1000 MG capsule Take 2 g by mouth daily. 1200 mg   Yes Historical Provider, MD  losartan (COZAAR) 100 MG tablet Take 1 tablet (100 mg total) by mouth daily. 02/08/15  Yes Hoyt Koch, MD  metoprolol succinate (TOPROL-XL) 25 MG 24 hr tablet Take 1 tablet (25 mg total) by mouth 2 (two) times daily. 09/01/14  Yes Hoyt Koch, MD  mometasone (NASONEX) 50 MCG/ACT nasal spray 2 sprays each nares q day Patient taking differently: Place 2 sprays into the nose as needed. 2 sprays each nares q day 02/01/14  Yes Edward Saguier, PA-C  Multiple Vitamin (MULTIVITAMIN) tablet Take 1 tablet by mouth daily.     Yes Historical Provider, MD  multivitamin-lutein (OCUVITE-LUTEIN) CAPS capsule Take 1 capsule by mouth daily.   Yes Historical Provider, MD  prazosin (MINIPRESS) 1 MG capsule take 2 capsules by mouth  every morning and 1 capsule every evening and 2 capsules at bedtime 10/10/14  Yes Hoyt Koch, MD  simvastatin (ZOCOR) 20 MG tablet Take 1 tablet (20 mg total) by mouth daily. 06/26/15  Yes Binnie Rail, MD  tamoxifen (NOLVADEX) 20 MG tablet take 1 tablet by mouth once daily 11/14/14  Yes Hoyt Koch, MD  traMADol (ULTRAM) 50 MG tablet Take 1 tablet (50 mg total) by mouth every 8 (eight) hours as needed for pain. 10/08/12  Yes Rosalita Chessman, DO  Vitamin D, Ergocalciferol, (DRISDOL) 50000 UNITS CAPS capsule Take 1 capsule (50,000 Units total) by mouth once a week. 06/26/15  Yes Binnie Rail, MD  Vitamin K, Phytonadione, 100 MCG TABS Take by mouth.   Yes Historical Provider, MD    Allergies  Allergen Reactions  . Fosamax [Alendronate Sodium] Other (See Comments)    Canker sores and bleeding gums    Past Surgical History  Procedure Laterality Date  . Cataract extraction  04/2000, 03/2002  . Carpal tunnel release  09/2004  . Breast surgery  08/08/11    lumpectomy    Social History  Substance Use Topics  . Smoking status: Former Smoker    Quit date: 07/08/1994  . Smokeless tobacco: Never Used  . Alcohol Use: Yes    Family History  Problem Relation Age of Onset  . Heart failure Mother   . Stroke Father   . Hypertension Other     Medication list has been reviewed and updated.  Physical Examination:  Physical Exam  Constitutional: She is oriented to person, place, and time. She appears well-developed and well-nourished. No distress.  HENT:  Head: Normocephalic and atraumatic.  Right Ear: Hearing normal.  Nose: Nose normal.  Eyes: Conjunctivae and lids are normal. Right eye exhibits no discharge. Left eye exhibits no discharge. No scleral icterus.  Cardiovascular: Normal rate, regular rhythm, normal heart sounds and normal pulses.   No murmur heard. Pulmonary/Chest: Effort normal and breath sounds normal. No respiratory distress. She has no wheezes. She has no  rhonchi. She has no rales.  Abdominal: Soft. Normal appearance and bowel sounds are normal. There is no tenderness. There is no CVA tenderness.  Musculoskeletal: Normal range of motion.  Lymphadenopathy:       Head (right side): No submental, no submandibular and no tonsillar adenopathy present.       Head (left side): No submental, no submandibular and no tonsillar adenopathy present.    She has no cervical adenopathy.  Neurological: She is alert and oriented to person, place, and time.  Skin: Skin is warm, dry and intact. No lesion and no rash noted.  Psychiatric: She has a normal mood and affect. Her speech is normal and behavior is normal. Thought content normal.   BP 138/63 mmHg  Pulse 69  Temp(Src) 98.4 F (36.9 C)  Resp 16  Ht 4' 8.5" (1.435 m)  Wt 111 lb (50.349 kg)  BMI 24.45 kg/m2  IMPRESSION: Normal  small bowel gas pattern. Some colonic gas noted in transverse colon.  Results for orders placed or performed in visit on 06/29/15  POCT CBC  Result Value Ref Range   WBC 8.6 4.6 - 10.2 K/uL   Lymph, poc 2.7 0.6 - 3.4   POC LYMPH PERCENT 31.4 10 - 50 %L   MID (cbc) 0.3 0 - 0.9   POC MID % 3.4 0 - 12 %M   POC Granulocyte 5.6 2 - 6.9   Granulocyte percent 65.2 37 - 80 %G   RBC 3.36 (A) 4.04 - 5.48 M/uL   Hemoglobin 10.6 (A) 12.2 - 16.2 g/dL   HCT, POC 30.6 (A) 37.7 - 47.9 %   MCV 91.0 80 - 97 fL   MCH, POC 31.5 (A) 27 - 31.2 pg   MCHC 34.6 31.8 - 35.4 g/dL   RDW, POC 12.1 %   Platelet Count, POC 176 142 - 424 K/uL   MPV 6.7 0 - 99.8 fL    Assessment and Plan:  1. Constipation, unspecified constipation type 2. Anemia, unspecified Radiograph with normal gas pattern. CBC with nml wbc. Mild anemia - chronic issue. Gave pt some bowel regimens to get cleaned out. I advised once cleaned out that she continue miralax QD for next 1-2 weeks to prevent constipation. She has been having more problems with constipation in the past 3 months. I suggested she may need to take  miralax daily if she continues to have problems. Pt has never had colonoscopy. If she continues to have problems with constipation, I will refer to GI. Anemia is more of a chronic issue but if worsens, would need to further eval for GI blood loss. - DG Abd 1 View; Future - Comprehensive metabolic panel - POCT CBC   Benjaman Pott. Drenda Freeze, MHS [facility]  06/29/2015

## 2015-07-01 ENCOUNTER — Emergency Department (HOSPITAL_COMMUNITY)
Admission: EM | Admit: 2015-07-01 | Discharge: 2015-07-01 | Disposition: A | Discharge status: Home / Self Care | Payer: Medicare Other | Attending: Emergency Medicine | Admitting: Emergency Medicine

## 2015-07-01 ENCOUNTER — Emergency Department (HOSPITAL_COMMUNITY): Payer: Medicare Other

## 2015-07-01 ENCOUNTER — Encounter (HOSPITAL_COMMUNITY): Payer: Self-pay | Admitting: Emergency Medicine

## 2015-07-01 DIAGNOSIS — I10 Essential (primary) hypertension: Secondary | ICD-10-CM | POA: Insufficient documentation

## 2015-07-01 DIAGNOSIS — K644 Residual hemorrhoidal skin tags: Secondary | ICD-10-CM | POA: Diagnosis not present

## 2015-07-01 DIAGNOSIS — Z79899 Other long term (current) drug therapy: Secondary | ICD-10-CM | POA: Diagnosis not present

## 2015-07-01 DIAGNOSIS — Z7982 Long term (current) use of aspirin: Secondary | ICD-10-CM | POA: Diagnosis not present

## 2015-07-01 DIAGNOSIS — D649 Anemia, unspecified: Secondary | ICD-10-CM | POA: Insufficient documentation

## 2015-07-01 DIAGNOSIS — R1010 Upper abdominal pain, unspecified: Secondary | ICD-10-CM | POA: Insufficient documentation

## 2015-07-01 DIAGNOSIS — E785 Hyperlipidemia, unspecified: Secondary | ICD-10-CM | POA: Insufficient documentation

## 2015-07-01 DIAGNOSIS — F419 Anxiety disorder, unspecified: Secondary | ICD-10-CM | POA: Insufficient documentation

## 2015-07-01 DIAGNOSIS — Z87891 Personal history of nicotine dependence: Secondary | ICD-10-CM | POA: Diagnosis not present

## 2015-07-01 DIAGNOSIS — M810 Age-related osteoporosis without current pathological fracture: Secondary | ICD-10-CM | POA: Insufficient documentation

## 2015-07-01 DIAGNOSIS — R109 Unspecified abdominal pain: Secondary | ICD-10-CM | POA: Diagnosis present

## 2015-07-01 DIAGNOSIS — Z792 Long term (current) use of antibiotics: Secondary | ICD-10-CM | POA: Diagnosis not present

## 2015-07-01 DIAGNOSIS — Z853 Personal history of malignant neoplasm of breast: Secondary | ICD-10-CM | POA: Diagnosis not present

## 2015-07-01 DIAGNOSIS — M1990 Unspecified osteoarthritis, unspecified site: Secondary | ICD-10-CM | POA: Diagnosis not present

## 2015-07-01 DIAGNOSIS — R110 Nausea: Secondary | ICD-10-CM | POA: Insufficient documentation

## 2015-07-01 LAB — COMPREHENSIVE METABOLIC PANEL
ALK PHOS: 38 U/L (ref 38–126)
ALT: 29 U/L (ref 14–54)
AST: 38 U/L (ref 15–41)
Albumin: 3.9 g/dL (ref 3.5–5.0)
Anion gap: 7 (ref 5–15)
BUN: 8 mg/dL (ref 6–20)
CALCIUM: 9.1 mg/dL (ref 8.9–10.3)
CO2: 24 mmol/L (ref 22–32)
CREATININE: 1 mg/dL (ref 0.44–1.00)
Chloride: 98 mmol/L — ABNORMAL LOW (ref 101–111)
GFR, EST AFRICAN AMERICAN: 57 mL/min — AB (ref >60)
GFR, EST NON AFRICAN AMERICAN: 49 mL/min — AB (ref >60)
Glucose, Bld: 121 mg/dL — ABNORMAL HIGH (ref 65–99)
Potassium: 4.5 mmol/L (ref 3.5–5.1)
Sodium: 129 mmol/L — ABNORMAL LOW (ref 135–145)
Total Bilirubin: 0.7 mg/dL (ref 0.3–1.2)
Total Protein: 6.5 g/dL (ref 6.5–8.1)

## 2015-07-01 LAB — LIPASE, BLOOD: LIPASE: 33 U/L (ref 11–51)

## 2015-07-01 LAB — CBC WITH DIFFERENTIAL/PLATELET
Basophils Absolute: 0 10*3/uL (ref 0.0–0.1)
Basophils Relative: 1 %
EOS ABS: 0.1 10*3/uL (ref 0.0–0.7)
Eosinophils Relative: 1 %
HCT: 30.5 % — ABNORMAL LOW (ref 36.0–46.0)
HEMOGLOBIN: 10.7 g/dL — AB (ref 12.0–15.0)
LYMPHS ABS: 1.2 10*3/uL (ref 0.7–4.0)
LYMPHS PCT: 21 %
MCH: 32.2 pg (ref 26.0–34.0)
MCHC: 35.1 g/dL (ref 30.0–36.0)
MCV: 91.9 fL (ref 78.0–100.0)
Monocytes Absolute: 1.1 10*3/uL — ABNORMAL HIGH (ref 0.1–1.0)
Monocytes Relative: 20 %
NEUTROS PCT: 57 %
Neutro Abs: 3.2 10*3/uL (ref 1.7–7.7)
Platelets: 175 10*3/uL (ref 150–400)
RBC: 3.32 MIL/uL — AB (ref 3.87–5.11)
RDW: 11.9 % (ref 11.5–15.5)
WBC: 5.5 10*3/uL (ref 4.0–10.5)

## 2015-07-01 LAB — URINE MICROSCOPIC-ADD ON: RBC / HPF: NONE SEEN RBC/hpf (ref 0–5)

## 2015-07-01 LAB — URINALYSIS, ROUTINE W REFLEX MICROSCOPIC
BILIRUBIN URINE: NEGATIVE
Glucose, UA: NEGATIVE mg/dL
HGB URINE DIPSTICK: NEGATIVE
KETONES UR: NEGATIVE mg/dL
Nitrite: NEGATIVE
PROTEIN: NEGATIVE mg/dL
Specific Gravity, Urine: 1.008 (ref 1.005–1.030)
pH: 6 (ref 5.0–8.0)

## 2015-07-01 MED ORDER — IOHEXOL 300 MG/ML  SOLN
25.0000 mL | Freq: Once | INTRAMUSCULAR | Status: AC | PRN
Start: 2015-07-01 — End: 2015-07-01
  Administered 2015-07-01: 25 mL via ORAL

## 2015-07-01 MED ORDER — IOHEXOL 300 MG/ML  SOLN
100.0000 mL | Freq: Once | INTRAMUSCULAR | Status: AC | PRN
  Administered 2015-07-01: 100 mL via INTRAVENOUS

## 2015-07-01 NOTE — ED Provider Notes (Signed)
CSN: [id number]     Arrival date & time 07/01/15  1254 History   First MD Initiated Contact with Patient 07/01/15 1417     Chief Complaint  Patient presents with  . Constipation     (Consider location/radiation/quality/duration/timing/severity/associated sxs/prior Treatment) HPI Patient presents with abdominal distention and pain for the past week. She's having difficulty having bowel movements. Was seen in the urgent care 3 days ago and started on MiraLAX and Colace. Patient states she's been taking this and has had 2 episodes of brown watery stool. No bleeding or melena. Denies fever or chills. Patient states she does have nausea but with no vomiting. No previous abdominal surgeries. Denies any urinary symptoms. Past Medical History  Diagnosis Date  . HYPERTENSION 07/25/2006  . ANXIETY 09/23/2007  . HYPERLIPIDEMIA 07/03/2009  . Atrial tachycardia ([place])   . MVP (mitral valve prolapse)   . Arthritis   . Osteoarthritis   . Breast cancer ([place])   . Anemia 12/10/2011  . Osteoporosis   . Prediabetes 06/15/2015   Past Surgical History  Procedure Laterality Date  . Cataract extraction  04/2000, 03/2002  . Carpal tunnel release  09/2004  . Breast surgery  08/08/11    lumpectomy   Family History  Problem Relation Age of Onset  . Heart failure Mother   . Stroke Father   . Hypertension Other    Social History  Substance Use Topics  . Smoking status: Former Smoker    Quit date: 07/08/1994  . Smokeless tobacco: Never Used  . Alcohol Use: Yes   OB History    No data available     Review of Systems  Constitutional: Negative for fever and chills.  Respiratory: Negative for cough and shortness of breath.   Cardiovascular: Negative for chest pain.  Gastrointestinal: Positive for nausea, abdominal pain, constipation and abdominal distention. Negative for vomiting and blood in stool.  Genitourinary: Negative for dysuria, frequency, hematuria and flank pain.  Musculoskeletal: Negative for  back pain, neck pain and neck stiffness.  Neurological: Negative for dizziness, weakness, light-headedness, numbness and headaches.  All other systems reviewed and are negative.     Allergies  Fosamax  Home Medications   Prior to Admission medications   Medication Sig Start Date End Date Taking? Authorizing Provider  aspirin 81 MG tablet Take 81 mg by mouth daily.     Yes Historical Provider, MD  Calcium Carbonate-Vit D-Min (GNP CALCIUM 1200) 1200-1000 MG-UNIT CHEW Chew 1 tablet by mouth daily.   Yes Historical Provider, MD  cloNIDine (CATAPRES) 0.1 MG tablet Take 1/2 tablet daily if B/P sustains greater than 99991111 systolic [id number]  Yes Minus Breeding, MD  diazepam (VALIUM) 10 MG tablet take 1 tablet by mouth twice a day if needed for anxiety 06/26/15  Yes Binnie Rail, MD  diltiazem (CARDIZEM CD) 120 MG 24 hr capsule take 1 capsule by mouth once daily 05/30/15  Yes Hoyt Koch, MD  Ferrous Sulfate (IRON) 90 (18 FE) MG TABS Take 2 tablets by mouth daily.     Yes Historical Provider, MD  fish oil-omega-3 fatty acids 1000 MG capsule Take 2 g by mouth daily. 1200 mg   Yes Historical Provider, MD  fluconazole (DIFLUCAN) 150 MG tablet Take 150 mg by mouth once.   Yes Historical Provider, MD  FLUZONE HIGH-DOSE 0.5 ML SUSY 04/12/15 04/12/15  Yes Historical Provider, MD  losartan (COZAAR) 100 MG tablet Take 1 tablet (100 mg total) by mouth daily. 02/08/15  Yes Hoyt Koch,  MD  metoprolol succinate (TOPROL-XL) 25 MG 24 hr tablet Take 1 tablet (25 mg total) by mouth 2 (two) times daily. 09/01/14  Yes Hoyt Koch, MD  mometasone (NASONEX) 50 MCG/ACT nasal spray 2 sprays each nares q day Patient taking differently: Place 2 sprays into the nose daily as needed (allergies). 2 sprays each nares q day 02/01/14  Yes Edward Saguier, PA-C  multivitamin-lutein [facility]) CAPS capsule Take 1 capsule by mouth daily.   Yes Historical Provider, MD  prazosin (MINIPRESS) 1 MG capsule  take 2 capsules by mouth every morning and 1 capsule every evening and 2 capsules at bedtime 10/10/14  Yes Hoyt Koch, MD  simvastatin (ZOCOR) 20 MG tablet Take 1 tablet (20 mg total) by mouth daily. 06/26/15  Yes Binnie Rail, MD  sulfamethoxazole-trimethoprim (BACTRIM DS,SEPTRA DS) 800-160 MG tablet Take 1 tablet by mouth 2 (two) times daily. X 5 days 06/26/15  Yes Historical Provider, MD  tamoxifen (NOLVADEX) 20 MG tablet take 1 tablet by mouth once daily 11/14/14  Yes Hoyt Koch, MD  traMADol (ULTRAM) 50 MG tablet Take 1 tablet (50 mg total) by mouth every 8 (eight) hours as needed for pain. 10/08/12  Yes Rosalita Chessman, DO  Vitamin D, Ergocalciferol, (DRISDOL) 50000 UNITS CAPS capsule Take 1 capsule (50,000 Units total) by mouth once a week. 06/26/15  Yes Binnie Rail, MD   BP 149/72 mmHg  Pulse 71  Temp(Src) 98.3 F (36.8 C) (Oral)  Resp 18  SpO2 99% Physical Exam  Constitutional: She is oriented to person, place, and time. She appears well-developed and well-nourished. No distress.  HENT:  Head: Normocephalic and atraumatic.  Mouth/Throat: Oropharynx is clear and moist.  Eyes: EOM are normal. Pupils are equal, round, and reactive to light.  Neck: Normal range of motion. Neck supple.  Cardiovascular: Normal rate and regular rhythm.  Exam reveals no gallop and no friction rub.   No murmur heard. Pulmonary/Chest: Effort normal and breath sounds normal. No respiratory distress. She has no wheezes. She has no rales. She exhibits no tenderness.  Abdominal: Soft. She exhibits distension. She exhibits no mass. There is tenderness (mild left upper quadrant tenderness.). There is no rebound and no guarding.  High-pitched bowel sounds  Genitourinary:  External hemorrhoids. Rectum is empty.   Musculoskeletal: Normal range of motion. She exhibits no edema or tenderness.  No CVA tenderness bilaterally  Neurological: She is alert and oriented to person, place, and time.  Moves  all extremities without deficit. Sensation is fully intact.  Skin: Skin is warm and dry. No rash noted. No erythema.  Psychiatric: She has a normal mood and affect. Her behavior is normal.  Nursing note and vitals reviewed.   ED Course  Procedures (including critical care time) Labs Review Labs Reviewed  CBC WITH DIFFERENTIAL/PLATELET - Abnormal; Notable for the following:    RBC 3.32 (*)    Hemoglobin 10.7 (*)    HCT 30.5 (*)    Monocytes Absolute 1.1 (*)    All other components within normal limits  COMPREHENSIVE METABOLIC PANEL - Abnormal; Notable for the following:    Sodium 129 (*)    Chloride 98 (*)    Glucose, Bld 121 (*)    GFR calc non Af Amer 49 (*)    GFR calc Af Amer 57 (*)    All other components within normal limits  URINALYSIS, ROUTINE W REFLEX MICROSCOPIC (NOT AT [facility]) - Abnormal; Notable for the following:    APPearance  CLOUDY (*)    Leukocytes, UA TRACE (*)    All other components within normal limits  URINE MICROSCOPIC-ADD ON - Abnormal; Notable for the following:    Squamous Epithelial / LPF 6-30 (*)    Bacteria, UA RARE (*)    All other components within normal limits  LIPASE, BLOOD    Imaging Review Ct Abdomen Pelvis W Contrast  07/01/2015  CLINICAL DATA:  Abdominal bloating. Low back pain. Personal history of breast cancer 4 years ago. EXAM: CT ABDOMEN AND PELVIS WITH CONTRAST TECHNIQUE: Multidetector CT imaging of the abdomen and pelvis was performed using the standard protocol following bolus administration of intravenous contrast. CONTRAST:  171mL OMNIPAQUE IOHEXOL 300 MG/ML SOLN, 55mL OMNIPAQUE IOHEXOL 300 MG/ML SOLN COMPARISON:  One-view abdomen 06/29/2015 FINDINGS: Lower chest: The lung bases are clear without focal nodule, mass, or airspace disease. The heart size is normal. No significant pleural or pericardial effusion is present. Hepatobiliary: Benign appearing cysts are present. No other focal lesions are evident. The common bile duct and  gallbladder are within normal limits for age. Pancreas: Pancreatic atrophy is within normal limits. Spleen: Negative Adrenals/Urinary Tract: The adrenal glands are normal bilaterally. The kidneys and ureters are unremarkable. Urinary bladder is within normal limits. Stomach/Bowel: The a stomach and duodenum are unremarkable. A small hiatal hernia is present. Small bowel is within normal limits. Oral contrast is seen in the distal small bowel and colon. The appendix is not discretely visualized and may be surgically absent. The more distal colon is unremarkable. The rectosigmoid colon is normal. Vascular/Lymphatic: Atherosclerotic calcifications are present without aneurysm. Dense calcifications are present the proximal right iliac artery with moderate to high-grade stenosis. No significant adenopathy is present. Reproductive: Uterus is mildly enlarged with fibroids. The ovaries are within normal limits for age. Other: There is no significant free fluid. Musculoskeletal: Grade 1 degenerative anterolisthesis is present at L3-4 L4-5. There is mild facet hypertrophy at both levels. No focal lytic or blastic lesions are present. IMPRESSION: 1. No acute or focal lesion to explain the patient's symptoms. 2. No evidence for bowel obstruction or ileus. 3. Moderate atherosclerotic changes with moderate to high-grade stenosis of the proximal right iliac artery. 4. Moderate spondylosis of the lower lumbar spine. Electronically Signed   By: San Morelle M.D.   On: 07/01/2015 17:43   I have personally reviewed and evaluated these images and lab results as part of my medical decision-making.   EKG Interpretation None      MDM   Final diagnoses:  Pain of upper abdomen   Patient abdominal exam is benign though does have some mild left upper quadrant tenderness. Signed out to oncoming emergency physician pending CT scan.     Julianne Rice, MD 07/02/15 1539

## 2015-07-01 NOTE — ED Notes (Signed)
Awake. Verbally responsive. Resp even and unlabored. No audible adventitious breath sounds noted. ABC's intact. Abd soft/nondistended but tender to palpate. BS (+) and active x4 quadrants. No N/V/D reported. 

## 2015-07-01 NOTE — ED Notes (Signed)
Awake. Verbally responsive. A/O x4. Resp even and unlabored. No audible adventitious breath sounds noted. ABC's intact. IV saline lock patent and intact. 

## 2015-07-01 NOTE — ED Notes (Signed)
Per pt, states constipation for a few days-was seen at [facility] and told to take Miralax-having watery stool-abdominal distention

## 2015-07-01 NOTE — ED Notes (Signed)
Awake. Verbally responsive. A/O x4. Resp even and unlabored. No audible adventitious breath sounds noted. ABC's intact. IV saline lock patent and intact. Family at bedside. 

## 2015-07-01 NOTE — ED Notes (Signed)
Pt reported LMB was Tuesday, abd bloating and lower back pain. Denies n/v and reported having diarrhea today. Abd soft/nondistended but tender to palpate. BS (+) and active x4 quadrants.

## 2015-07-01 NOTE — ED Notes (Signed)
Awake. Verbally responsive. A/O x4. Resp even and unlabored. No audible adventitious breath sounds noted. ABC's intact.  

## 2015-07-01 NOTE — ED Provider Notes (Signed)
Care transferred to me. CT scan shows no acute abrormalities. No signs of significant constipation or obstruction. Discussed bowel care with patient and family and discussed return precautions as well as follow-up with PCP.  Results for orders placed or performed during the hospital encounter of 07/01/15  CBC with Differential/Platelet  Result Value Ref Range   WBC 5.5 4.0 - 10.5 K/uL   RBC 3.32 (L) 3.87 - 5.11 MIL/uL   Hemoglobin 10.7 (L) 12.0 - 15.0 g/dL   HCT 30.5 (L) 36.0 - 46.0 %   MCV 91.9 78.0 - 100.0 fL   MCH 32.2 26.0 - 34.0 pg   MCHC 35.1 30.0 - 36.0 g/dL   RDW 11.9 11.5 - 15.5 %   Platelets 175 150 - 400 K/uL   Neutrophils Relative % 57 %   Neutro Abs 3.2 1.7 - 7.7 K/uL   Lymphocytes Relative 21 %   Lymphs Abs 1.2 0.7 - 4.0 K/uL   Monocytes Relative 20 %   Monocytes Absolute 1.1 (H) 0.1 - 1.0 K/uL   Eosinophils Relative 1 %   Eosinophils Absolute 0.1 0.0 - 0.7 K/uL   Basophils Relative 1 %   Basophils Absolute 0.0 0.0 - 0.1 K/uL  Comprehensive metabolic panel  Result Value Ref Range   Sodium 129 (L) 135 - 145 mmol/L   Potassium 4.5 3.5 - 5.1 mmol/L   Chloride 98 (L) 101 - 111 mmol/L   CO2 24 22 - 32 mmol/L   Glucose, Bld 121 (H) 65 - 99 mg/dL   BUN 8 6 - 20 mg/dL   Creatinine, Ser 1.00 0.44 - 1.00 mg/dL   Calcium 9.1 8.9 - 10.3 mg/dL   Total Protein 6.5 6.5 - 8.1 g/dL   Albumin 3.9 3.5 - 5.0 g/dL   AST 38 15 - 41 U/L   ALT 29 14 - 54 U/L   Alkaline Phosphatase 38 38 - 126 U/L   Total Bilirubin 0.7 0.3 - 1.2 mg/dL   GFR calc non Af Amer 49 (L) >60 mL/min   GFR calc Af Amer 57 (L) >60 mL/min   Anion gap 7 5 - 15  Lipase, blood  Result Value Ref Range   Lipase 33 11 - 51 U/L  Urinalysis, Routine w reflex microscopic (not at [facility])  Result Value Ref Range   Color, Urine YELLOW YELLOW   APPearance CLOUDY (A) CLEAR   Specific Gravity, Urine 1.008 1.005 - 1.030   pH 6.0 5.0 - 8.0   Glucose, UA NEGATIVE NEGATIVE mg/dL   Hgb urine dipstick NEGATIVE NEGATIVE   Bilirubin Urine NEGATIVE NEGATIVE   Ketones, ur NEGATIVE NEGATIVE mg/dL   Protein, ur NEGATIVE NEGATIVE mg/dL   Nitrite NEGATIVE NEGATIVE   Leukocytes, UA TRACE (A) NEGATIVE  Urine microscopic-add on  Result Value Ref Range   Squamous Epithelial / LPF 6-30 (A) NONE SEEN   WBC, UA 0-5 0 - 5 WBC/hpf   RBC / HPF NONE SEEN 0 - 5 RBC/hpf   Bacteria, UA RARE (A) NONE SEEN   Dg Abd 1 View  06/29/2015  CLINICAL DATA:  Abdominal pain for 2 weeks, bloating EXAM: ABDOMEN - 1 VIEW COMPARISON:  03/25/2012 FINDINGS: There is normal small bowel gas pattern. Some colonic gas noted in transverse colon. Pelvic phleboliths are again noted. Again noted degenerative changes lumbar spine. IMPRESSION: Normal small bowel gas pattern. Some colonic gas noted in transverse colon. Electronically Signed   By: Lahoma Crocker M.D.   On: 06/29/2015 15:31   Ct Abdomen  Pelvis W Contrast  07/01/2015  CLINICAL DATA:  Abdominal bloating. Low back pain. Personal history of breast cancer 4 years ago. EXAM: CT ABDOMEN AND PELVIS WITH CONTRAST TECHNIQUE: Multidetector CT imaging of the abdomen and pelvis was performed using the standard protocol following bolus administration of intravenous contrast. CONTRAST:  178mL OMNIPAQUE IOHEXOL 300 MG/ML SOLN, 46mL OMNIPAQUE IOHEXOL 300 MG/ML SOLN COMPARISON:  One-view abdomen 06/29/2015 FINDINGS: Lower chest: The lung bases are clear without focal nodule, mass, or airspace disease. The heart size is normal. No significant pleural or pericardial effusion is present. Hepatobiliary: Benign appearing cysts are present. No other focal lesions are evident. The common bile duct and gallbladder are within normal limits for age. Pancreas: Pancreatic atrophy is within normal limits. Spleen: Negative Adrenals/Urinary Tract: The adrenal glands are normal bilaterally. The kidneys and ureters are unremarkable. Urinary bladder is within normal limits. Stomach/Bowel: The a stomach and duodenum are unremarkable. A  small hiatal hernia is present. Small bowel is within normal limits. Oral contrast is seen in the distal small bowel and colon. The appendix is not discretely visualized and may be surgically absent. The more distal colon is unremarkable. The rectosigmoid colon is normal. Vascular/Lymphatic: Atherosclerotic calcifications are present without aneurysm. Dense calcifications are present the proximal right iliac artery with moderate to high-grade stenosis. No significant adenopathy is present. Reproductive: Uterus is mildly enlarged with fibroids. The ovaries are within normal limits for age. Other: There is no significant free fluid. Musculoskeletal: Grade 1 degenerative anterolisthesis is present at L3-4 L4-5. There is mild facet hypertrophy at both levels. No focal lytic or blastic lesions are present. IMPRESSION: 1. No acute or focal lesion to explain the patient's symptoms. 2. No evidence for bowel obstruction or ileus. 3. Moderate atherosclerotic changes with moderate to high-grade stenosis of the proximal right iliac artery. 4. Moderate spondylosis of the lower lumbar spine. Electronically Signed   By: San Morelle M.D.   On: 07/01/2015 17:43   Mm Diag Breast Tomo Bilateral  06/23/2015  CLINICAL DATA:  Status post right lumpectomy for breast carcinoma in January 2013. No current complaints. EXAM: DIGITAL DIAGNOSTIC BILATERAL MAMMOGRAM WITH 3D TOMOSYNTHESIS AND CAD COMPARISON:  Previous exam(s). ACR Breast Density Category b: There are scattered areas of fibroglandular density. FINDINGS: Focal opacity with architectural distortion in the central right breast is stable reflecting postsurgical scarring. There are no discrete masses or other areas of architectural distortion. There are no suspicious calcifications. Mammographic images were processed with CAD. IMPRESSION: No evidence of recurrent or new breast malignancy. Benign postsurgical changes on right. RECOMMENDATION: Diagnostic mammography in 1  year per standard post lumpectomy protocol. I have discussed the findings and recommendations with the patient. Results were also provided in writing at the conclusion of the visit. If applicable, a reminder letter will be sent to the patient regarding the next appointment. BI-RADS CATEGORY  2: Benign. Electronically Signed   By: Lajean Manes M.D.   On: 06/23/2015 16:26      Sherwood Gambler, MD 07/01/15 [phone]

## 2015-07-01 NOTE — ED Notes (Signed)
Awake. Verbally responsive. Resp even and unlabored. No audible adventitious breath sounds noted. ABC's intact. Abd soft/nondistended but tender to palpate.  No N/V reported. Pt reported ambulating to BR with steady gait to have BM with watery stools

## 2015-07-03 ENCOUNTER — Telehealth: Payer: Self-pay | Admitting: Physician Assistant

## 2015-07-03 NOTE — Telephone Encounter (Signed)
Spoke with pt by phone. She was seen in the ED 2 days after OV for constipation. Abdominal CT negative. Her sodium had increased to 129 and serum creatinine now normal. She is stating that they told her to not take miralax ? And to only take metamucil and mylanta. She did have a BM this morning and is feeling a little better. She will return if symptoms worsen.

## 2015-07-07 ENCOUNTER — Telehealth: Payer: Self-pay | Admitting: Internal Medicine

## 2015-07-07 NOTE — Telephone Encounter (Signed)
Pt called wans to let Dr. Quay Burow know that she wants to hold off on the appt with Dr. Delice Lesch (neurology) on 07/31/14.

## 2015-07-09 NOTE — Telephone Encounter (Signed)
Ok - was being referred for memory concerns - can see neuro at any time if she wishes

## 2015-08-01 ENCOUNTER — Ambulatory Visit: Payer: Medicare Other | Admitting: Neurology

## 2015-08-03 ENCOUNTER — Telehealth: Payer: Self-pay | Admitting: Cardiology

## 2015-08-03 MED ORDER — LOSARTAN POTASSIUM 100 MG PO TABS: 100.0000 mg | ORAL_TABLET | Freq: Every day | ORAL | Status: DC

## 2015-08-03 NOTE — Telephone Encounter (Signed)
°*  STAT* If patient is at the pharmacy, call can be transferred to refill team.   1. Which medications need to be refilled? (please list name of each medication and dose if known)Losartan    2. Which pharmacy/location (including street and city if local pharmacy) is medication to be sent to?[facility] [address]   3. Do they need a 30 day or 90 day supply? 90  Patient states that pharmacy says they sent the request.

## 2015-08-03 NOTE — Telephone Encounter (Signed)
Refill sent.

## 2015-08-08 ENCOUNTER — Telehealth: Payer: Self-pay | Admitting: Internal Medicine

## 2015-08-15 ENCOUNTER — Telehealth: Payer: Self-pay | Admitting: Internal Medicine

## 2015-08-15 MED ORDER — DIAZEPAM 10 MG PO TABS: 5.0000 mg | ORAL_TABLET | Freq: Two times a day (BID) | ORAL | Status: DC | PRN

## 2015-08-15 NOTE — Telephone Encounter (Signed)
rx printed

## 2015-08-15 NOTE — Telephone Encounter (Signed)
Pt requesting refill for diazepam (VALIUM) 10 MG tablet [id number] Pharmacy is [facility] on [address].

## 2015-08-15 NOTE — Telephone Encounter (Signed)
Last OV 06/15/2015, last refill 06/26/15. Please advise.

## 2015-08-15 NOTE — Telephone Encounter (Signed)
RX has been faxed to pharm.  

## 2015-09-03 ENCOUNTER — Other Ambulatory Visit: Payer: Self-pay | Admitting: Internal Medicine

## 2015-09-05 ENCOUNTER — Telehealth: Payer: Self-pay | Admitting: Oncology

## 2015-09-05 ENCOUNTER — Encounter: Payer: Self-pay | Admitting: Nurse Practitioner

## 2015-09-05 ENCOUNTER — Telehealth: Payer: Self-pay | Admitting: *Deleted

## 2015-09-05 ENCOUNTER — Ambulatory Visit (HOSPITAL_BASED_OUTPATIENT_CLINIC_OR_DEPARTMENT_OTHER): Payer: Medicare Other | Admitting: Nurse Practitioner

## 2015-09-05 VITALS — BP 140/51 | HR 66 | Temp 98.1°F | Resp 18 | Ht <= 58 in | Wt 108.3 lb

## 2015-09-05 DIAGNOSIS — M545 Low back pain: Secondary | ICD-10-CM | POA: Diagnosis not present

## 2015-09-05 DIAGNOSIS — M8580 Other specified disorders of bone density and structure, unspecified site: Secondary | ICD-10-CM | POA: Diagnosis not present

## 2015-09-05 DIAGNOSIS — C50911 Malignant neoplasm of unspecified site of right female breast: Secondary | ICD-10-CM | POA: Diagnosis present

## 2015-09-05 DIAGNOSIS — N393 Stress incontinence (female) (male): Secondary | ICD-10-CM | POA: Diagnosis not present

## 2015-09-05 DIAGNOSIS — M25552 Pain in left hip: Secondary | ICD-10-CM | POA: Diagnosis not present

## 2015-09-05 DIAGNOSIS — M25551 Pain in right hip: Secondary | ICD-10-CM | POA: Diagnosis not present

## 2015-09-05 DIAGNOSIS — D638 Anemia in other chronic diseases classified elsewhere: Secondary | ICD-10-CM

## 2015-09-05 MED ORDER — TAMOXIFEN CITRATE 20 MG PO TABS: 20.0000 mg | ORAL_TABLET | Freq: Every day | ORAL | Status: DC

## 2015-09-05 NOTE — Telephone Encounter (Signed)
Gave patient avs report and appointments for February 2018.  °

## 2015-09-05 NOTE — Progress Notes (Signed)
Gabrielle Warren  DOB: 02-23-1926  MR#: [id number]  CSN#: [id number]    PCP: Binnie Rail, MD GYN: Cheri Fowler, MD SU: Coralie Keens, MD OTHER MD: Minus Breeding, MD  CHIEF COMPLAINT:   Estrogen receptor positive Right Breast Cancer   CURRENT TREATMENT: Tamoxifen                 History of present illness:    from the original intake note:   "Gabrielle Warren is an 82 year old [place] woman who on screening mammography at [facility] was found to have a suspicious mass. I do not have those records, but additional views confirmed the abnormality andb iopsy was attempted 26/83/4196, complicated by bleeding. The patient was then referred to the breast Center whereultrasound-guided biopsy was performed 07/05/2011. This showed an invasive ductal carcinoma, grade 1, HER-2 negative, 100% estrogen receptor positive, 86% progesterone receptor positive, with an MIB-1 of 8%.the patient was then referred for bilateral breast MRIs which showed a 9 mm spiculated enhancing mass in the middle third of the right breast. There was an associated clip artifact. In the contralateral breast, a 5 mm mass was found which was irregular and nodular, with washout genetics. Biopsy of the second mass under MRI guidance (it could not be visualized under ultrasound) is scheduled for January 10. "  Her subsequent history is as detailed below  Interval history:  Gabrielle Warren returns today for follow-up of her early-stage estrogen receptor positive breast cancer.  She has been on tamoxifen since February 2013 and tolerates this drug well with no side effects that she is aware of. She denies hot flashes or vaginal changes. The interval history is remarkable for multiple fractures in her pelvic bone. She did not fall and is unsure of how she sustained this injury. She is able to walk but only short distances. The pain to her hips is too great. The fractures are too small and are in bones not easily accessed so there  is no surgery planned. She is not using any pain medicine.   Review of Systems: Gabrielle Warren denies fevers, chills, nausea, or vomiting. She has stress urinary incontinence and tends to be constipated. She uses fiber supplements daily. She complains of abdominal bloating and getting full easily. A CT of the abdomen was negative. She has been able to maintain her weight. She denies shortness of breath, chest pain, cough, or palpitations. She has chronic low back pain. She has a history of degenerative disc disease. She has vision changes and ringing in her ears. She feels forgetful and anxious but not depressed.A detailed review of systems is otherwise stable.  Past medical history:      Past Medical History  Diagnosis Date  . HYPERTENSION 07/25/2006  . ANXIETY 09/23/2007  . HYPERLIPIDEMIA 07/03/2009  . Atrial tachycardia ([place])   . MVP (mitral valve prolapse)   . Arthritis   . Osteoarthritis   . Breast cancer ([place])   . Anemia 12/10/2011  . Osteoporosis   . Prediabetes 06/15/2015  coronary artery disease, status post remote heart attack (2229)  Past surgical history:      Past Surgical History  Procedure Laterality Date  . Cataract extraction  04/2000, 03/2002  . Carpal tunnel release  09/2004  . Breast surgery  08/08/11    lumpectomy    Family history:     Family History  Problem Relation Age of Onset  . Heart failure Mother   . Stroke Father   . Hypertension Other   the patient's  father died suddenly at the age of 45, of unknown causes. The patient's mother died at the age of 5 from complications following a leg amputation. The patient had 2 brothers and 2 sisters. There is no history of breast or ovarian cancer in the immediate family  Gynecologic history:    The patient had menarche age 61, last menstrual period 41. She took Prempro for many years, stopping only in December of 2012 with this diagnosis. The patient is GX P3, first pregnancy to term age 64.    Social history:   (Updated 09/22/2013) The patient's maiden name was [place]. Her family originally came from [place]. 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 Her 3 children are Daughter Juliann Pulse age 71 works in [place] as a Licensed conveyancer. Son Milta Deiters 38 lives in [place] and is a retired Scientist, research (medical). Son Nicolette Bang") 54, lives in [place] and works in Nutritional therapist. The patient has no grandchildren. She is alone at home except for a cat, but tells me that her daughter Juliann Pulse lives nearby.  As of March 2015, she is trying to sell her home, and plans to move in with [place].    ADVANCED DIRECTIVES: yes  Health maintenance:      (Updated 09/22/2013) Social History  Substance Use Topics  . Smoking status: Former Smoker    Quit date: 07/08/1994  . Smokeless tobacco: Never Used  . Alcohol Use: Yes      Colonoscopy: never  PAP: UTD/[facility]  Bone density: Dec 2011, severe osteopenia with T score of -2.4 ([facility])  Cholesterol: Dec 2014/[facility]  Allergies:     Allergies  Allergen Reactions  . Fosamax [Alendronate Sodium] Other (See Comments)    Canker sores and bleeding gums    Medications:      Current Outpatient Prescriptions  Medication Sig Dispense Refill  . aspirin 81 MG tablet Take 81 mg by mouth daily.      . Calcium Carbonate-Vit D-Min (GNP CALCIUM 1200) 1200-1000 MG-UNIT CHEW Chew 1 tablet by mouth daily.    . diazepam (VALIUM) 10 MG tablet Take 0.5 tablets (5 mg total) by mouth every 12 (twelve) hours as needed for anxiety. 45 tablet 0  . diltiazem (CARDIZEM CD) 120 MG 24 hr capsule take 1 capsule by mouth once daily 90 capsule 3  . Ferrous Sulfate (IRON) 90 (18 FE) MG TABS Take 2 tablets by mouth daily.      . fish oil-omega-3 fatty acids 1000 MG capsule Take 2 g by mouth daily. 1200 mg    . losartan (COZAAR) 100 MG tablet Take 1 tablet (100 mg total) by mouth daily. 90 tablet 1  . metoprolol succinate (TOPROL-XL) 25 MG 24 hr tablet TAKE ONE  TABLET BY MOUTH TWICE DAILY 180 tablet 0  . multivitamin-lutein (OCUVITE-LUTEIN) CAPS capsule Take 1 capsule by mouth daily.    . prazosin (MINIPRESS) 1 MG capsule take 2 capsules by mouth every morning and 1 capsule every evening and 2 capsules at bedtime 450 capsule 3  . simvastatin (ZOCOR) 20 MG tablet Take 1 tablet (20 mg total) by mouth daily. 90 tablet 1  . Vitamin D, Ergocalciferol, (DRISDOL) 50000 UNITS CAPS capsule Take 1 capsule (50,000 Units total) by mouth once a week. 12 capsule 0  . cloNIDine (CATAPRES) 0.1 MG tablet Take 1/2 tablet daily if B/P sustains greater than 161 systolic (Patient not taking: Reported on 09/05/2015) 30 tablet 6  . mometasone (NASONEX) 50 MCG/ACT nasal spray 2 sprays  each nares q day (Patient not taking: Reported on 09/05/2015) 17 g 0  . tamoxifen (NOLVADEX) 20 MG tablet Take 1 tablet (20 mg total) by mouth daily. 90 tablet 3   No current facility-administered medications for this visit.    Physical exam:  Elderly white woman who appears stated age, examined in wheelchair  Filed Vitals:   09/05/15 1413  BP: 140/51  Pulse: 66  Temp: 98.1 F (36.7 C)  Resp: 18     Body mass index is 26.6 kg/(m^2).  ECOG PS: 1 Filed Weights   09/05/15 1413  Weight: 108 lb 4.8 oz (49.125 kg)   Skin: warm, dry  HEENT: sclerae anicteric, conjunctivae pink, oropharynx clear. No thrush or mucositis.  Lymph Nodes: No cervical or supraclavicular lymphadenopathy  Lungs: clear to auscultation bilaterally, no rales, wheezes, or rhonci  Heart: regular rate and rhythm  Abdomen: round, soft, non tender, positive bowel sounds  Musculoskeletal: No focal spinal tenderness, no peripheral edema  Neuro: non focal, well oriented, positive affect  Breasts: right breast status post lumpectomy. No evidence of recurrent disease. Right axilla benign. Left breast unremarkable.  Lab results:        Component Value Date/Time   WBC 5.5 07/01/2015 1453   WBC 8.6 06/29/2015 1602   WBC 9.0  01/11/2013 1006   RBC 3.32* 07/01/2015 1453   RBC 3.36* 06/29/2015 1602   RBC 3.44* 06/08/2013 1418   RBC 3.41* 01/11/2013 1006   HGB 10.7* 07/01/2015 1453   HGB 10.6* 06/29/2015 1602   HGB 11.1* 01/11/2013 1006   HCT 30.5* 07/01/2015 1453   HCT 30.6* 06/29/2015 1602   HCT 31.7* 01/11/2013 1006   PLT 175 07/01/2015 1453   PLT 195 01/11/2013 1006   MCV 91.9 07/01/2015 1453   MCV 91.0 06/29/2015 1602   MCV 93.1 01/11/2013 1006   MCH 32.2 07/01/2015 1453   MCH 31.5* 06/29/2015 1602   MCH 32.7 01/11/2013 1006   MCHC 35.1 07/01/2015 1453   MCHC 34.6 06/29/2015 1602   MCHC 35.1 01/11/2013 1006   RDW 11.9 07/01/2015 1453   RDW 12.3 01/11/2013 1006   LYMPHSABS 1.2 07/01/2015 1453   LYMPHSABS 2.0 01/11/2013 1006   MONOABS 1.1* 07/01/2015 1453   MONOABS 0.8 01/11/2013 1006   EOSABS 0.1 07/01/2015 1453   EOSABS 0.2 01/11/2013 1006   BASOSABS 0.0 07/01/2015 1453   BASOSABS 0.1 01/11/2013 1006        Component Value Date/Time   NA 129* 07/01/2015 1453   NA 134* 01/11/2013 1006   K 4.5 07/01/2015 1453   K 4.2 01/11/2013 1006   CL 98* 07/01/2015 1453   CO2 24 07/01/2015 1453   CO2 26 01/11/2013 1006   GLUCOSE 121* 07/01/2015 1453   GLUCOSE 130 01/11/2013 1006   GLUCOSE 124* 06/18/2006 0958   BUN 8 07/01/2015 1453   BUN 9.3 01/11/2013 1006   CREATININE 1.00 07/01/2015 1453   CREATININE 1.23* 06/29/2015 1533   CREATININE 0.8 01/11/2013 1006   CALCIUM 9.1 07/01/2015 1453   CALCIUM 9.0 01/11/2013 1006   PROT 6.5 07/01/2015 1453   PROT 6.5 01/11/2013 1006   ALBUMIN 3.9 07/01/2015 1453   ALBUMIN 3.2* 01/11/2013 1006   AST 38 07/01/2015 1453   AST 19 01/11/2013 1006   ALT 29 07/01/2015 1453   ALT 19 01/11/2013 1006   ALKPHOS 38 07/01/2015 1453   ALKPHOS 39* 01/11/2013 1006   BILITOT 0.7 07/01/2015 1453   BILITOT 0.32 01/11/2013 1006   GFRNONAA 49* 07/01/2015  [place] 12/14/2013 0829   GFRAA 57* 07/01/2015 1453   GFRAA 75 12/14/2013 0829       Studies:     EXAM: DIGITAL DIAGNOSTIC BILATERAL MAMMOGRAM WITH 3D TOMOSYNTHESIS AND CAD  COMPARISON: Previous exam(s).  ACR Breast Density Category b: There are scattered areas of fibroglandular density.  FINDINGS: Focal opacity with architectural distortion in the central right breast is stable reflecting postsurgical scarring.  There are no discrete masses or other areas of architectural distortion. There are no suspicious calcifications.  Mammographic images were processed with CAD.  IMPRESSION: No evidence of recurrent or new breast malignancy. Benign postsurgical changes on right.  RECOMMENDATION: Diagnostic mammography in 1 year per standard post lumpectomy protocol.  I have discussed the findings and recommendations with the patient. Results were also provided in writing at the conclusion of the visit. If applicable, a reminder letter will be sent to the patient regarding the next appointment.  BI-RADS CATEGORY 2: Benign.   Electronically Signed  By: Lajean Manes M.D.  On: 06/23/2015 16:26  Assessment: 82 y.o.  [place] woman   (1)  status post right lumpectomy 08/08/2011 for a T1c NX invasive ductal carcinoma, grade 1, strongly estrogen and progesterone receptor positive, HER-2 negative, with an MIB-1 of less than 10.  (2)  no adjuvant radiation or chemotherapy.  (3)  started on tamoxifen February 2013, the plan being to continue for total of 5 years (until February 2018).  (4)  Mild anemia, chronic and stable  Plan:  Gabrielle Warren is doing well as far as her breast cancer is concerned. She is now 4 years out from her definitive surgery with no evidence of recurrent disease. Her most recent mammogram was benign. She is tolerating tamoxifen well and will continue on this drug for at least another year to complete 5 years of antiestrogen therapy.   Gabrielle Warren will return in 1 year for a follow up visit with Dr. Jana Hakim. At this time she will be eligible to  "graduate" from anti-estrogen therapy and observation. Prior to this visit she will have a repeat mammogram. She understands and agrees with this plan. She knows the goal of treatment in her case is cure. She understands and agrees with this plan. She knows the goal of treatment in her case is cure. She has been encouraged to call with any issues that might arise before her next visit here.   Laurie Panda, NP 09/05/2015

## 2015-10-03 ENCOUNTER — Telehealth: Payer: Self-pay | Admitting: *Deleted

## 2015-10-03 MED ORDER — DIAZEPAM 10 MG PO TABS: 5.0000 mg | ORAL_TABLET | Freq: Two times a day (BID) | ORAL | Status: DC | PRN

## 2015-10-03 NOTE — Telephone Encounter (Signed)
RX faxed to pharm  

## 2015-10-03 NOTE — Telephone Encounter (Signed)
Patient left a message on the refill voicemail requesting an rx for diazepam 10mg  be sent to [facility] on [address]. She stated that the pharmacy informed her that they contacted Dr Quay Burow office for this, but they have not received any response and she would like for Dr Percival Spanish to refill this for her. She can be reached at [phone].

## 2015-10-03 NOTE — Telephone Encounter (Signed)
Needs refill on Diazepam, please advise. Saw pt in Dec

## 2015-10-03 NOTE — Telephone Encounter (Signed)
printed

## 2015-10-03 NOTE — Telephone Encounter (Signed)
Please advise on refill.

## 2015-10-03 NOTE — Telephone Encounter (Signed)
Routed to PCP office via EPIC

## 2015-10-04 ENCOUNTER — Telehealth: Payer: Self-pay | Admitting: *Deleted

## 2015-10-04 NOTE — Telephone Encounter (Signed)
This was done yesterday.  

## 2015-10-04 NOTE — Telephone Encounter (Signed)
Faxed to pharm on 10/03/2015

## 2015-10-04 NOTE — Telephone Encounter (Signed)
Called pt inform refill was faxed [facility] to [facility] yesterday...Gabrielle Warren

## 2015-10-04 NOTE — Telephone Encounter (Signed)
Left msg on triage requesting refills on her Diazepam.../lmb

## 2015-10-17 ENCOUNTER — Other Ambulatory Visit: Payer: Self-pay | Admitting: Emergency Medicine

## 2015-10-17 MED ORDER — PRAZOSIN HCL 1 MG PO CAPS: ORAL_CAPSULE | ORAL | Status: DC

## 2015-11-14 ENCOUNTER — Other Ambulatory Visit: Payer: Self-pay | Admitting: Emergency Medicine

## 2015-11-14 MED ORDER — DIAZEPAM 10 MG PO TABS: 5.0000 mg | ORAL_TABLET | Freq: Two times a day (BID) | ORAL | Status: DC | PRN

## 2015-11-14 NOTE — Telephone Encounter (Signed)
RX faxed to [facility] 

## 2015-11-15 ENCOUNTER — Telehealth: Payer: Self-pay | Admitting: *Deleted

## 2015-11-15 DIAGNOSIS — C50011 Malignant neoplasm of nipple and areola, right female breast: Secondary | ICD-10-CM

## 2015-11-15 MED ORDER — TAMOXIFEN CITRATE 20 MG PO TABS: 20.0000 mg | ORAL_TABLET | Freq: Every day | ORAL | Status: DC

## 2015-11-15 NOTE — Telephone Encounter (Signed)
Called patient after receipt of an inaudible voicemail for further assessment.  "I need a refill and calling the only number I recall.  I think [place] handles the Tamoxifen for me."  This nurse advised onhow to call pharmacy for refills and how to use [employer].  Refill sent to [facility] [place]. per patient's request.

## 2015-11-16 ENCOUNTER — Telehealth: Payer: Self-pay | Admitting: *Deleted

## 2015-11-16 NOTE — Telephone Encounter (Signed)
Left msg on triage yesterday starting she has been trying to get refills on her Tamoxifen since Monday, current seeing Dr. Jana Hakim and having treatment for [facility]. Called pt back no answer LMOM per chart refill was done by Dr. Jana Hakim yesterday [facility]...Johny Chess

## 2015-12-05 ENCOUNTER — Ambulatory Visit (INDEPENDENT_AMBULATORY_CARE_PROVIDER_SITE_OTHER)
Admission: RE | Admit: 2015-12-05 | Discharge: 2015-12-05 | Disposition: A | Discharge status: Home / Self Care | Payer: Medicare Other | Source: Ambulatory Visit | Attending: Internal Medicine | Admitting: Internal Medicine

## 2015-12-05 ENCOUNTER — Ambulatory Visit (INDEPENDENT_AMBULATORY_CARE_PROVIDER_SITE_OTHER): Payer: Medicare Other | Admitting: Internal Medicine

## 2015-12-05 ENCOUNTER — Encounter: Payer: Self-pay | Admitting: Internal Medicine

## 2015-12-05 ENCOUNTER — Other Ambulatory Visit: Payer: Self-pay | Admitting: Internal Medicine

## 2015-12-05 VITALS — BP 160/80 | HR 62 | Wt 103.0 lb

## 2015-12-05 DIAGNOSIS — K5901 Slow transit constipation: Secondary | ICD-10-CM | POA: Diagnosis not present

## 2015-12-05 DIAGNOSIS — K5900 Constipation, unspecified: Secondary | ICD-10-CM | POA: Insufficient documentation

## 2015-12-05 DIAGNOSIS — I10 Essential (primary) hypertension: Secondary | ICD-10-CM | POA: Diagnosis not present

## 2015-12-05 MED ORDER — LINACLOTIDE 290 MCG PO CAPS: 290.0000 ug | ORAL_CAPSULE | Freq: Every day | ORAL | Status: DC | PRN

## 2015-12-05 NOTE — Patient Instructions (Signed)
Hold Simvastatin Take Linzess sampled one a day: try lower doses first as we discussed

## 2015-12-05 NOTE — Assessment & Plan Note (Addendum)
5/17 chronic sx's are worse Abd X ray Labs reviewed Hold Simvastatin Linzess to try Dr Quay Burow f/u 2 wks

## 2015-12-05 NOTE — Progress Notes (Signed)
Pre visit review using our clinic review tool, if applicable. No additional management support is needed unless otherwise documented below in the visit note. 

## 2015-12-05 NOTE — Progress Notes (Signed)
Subjective:  Patient ID: Gabrielle Warren, female    DOB: 03/31/1926  Age: 82 y.o. MRN: [id number]  CC: Constipation   HPI Gabrielle Warren presents for a worsening chronic constipation - small BMs after enemas x2. Using MOM. C/o bloating. Passing gas ok.  Outpatient Prescriptions Prior to Visit  Medication Sig Dispense Refill  . aspirin 81 MG tablet Take 81 mg by mouth daily.      . Calcium Carbonate-Vit D-Min (GNP CALCIUM 1200) 1200-1000 MG-UNIT CHEW Chew 1 tablet by mouth daily.    . cloNIDine (CATAPRES) 0.1 MG tablet Take 1/2 tablet daily if B/P sustains greater than 99991111 systolic 30 tablet 6  . diazepam (VALIUM) 10 MG tablet Take 0.5 tablets (5 mg total) by mouth every 12 (twelve) hours as needed for anxiety. 45 tablet 0  . diltiazem (CARDIZEM CD) 120 MG 24 hr capsule take 1 capsule by mouth once daily 90 capsule 3  . Ferrous Sulfate (IRON) 90 (18 FE) MG TABS Take 2 tablets by mouth daily.      . fish oil-omega-3 fatty acids 1000 MG capsule Take 2 g by mouth daily. 1200 mg    . losartan (COZAAR) 100 MG tablet Take 1 tablet (100 mg total) by mouth daily. 90 tablet 1  . metoprolol succinate (TOPROL-XL) 25 MG 24 hr tablet TAKE ONE TABLET BY MOUTH TWICE DAILY 180 tablet 1  . mometasone (NASONEX) 50 MCG/ACT nasal spray 2 sprays each nares q day 17 g 0  . multivitamin-lutein (OCUVITE-LUTEIN) CAPS capsule Take 1 capsule by mouth daily.    . prazosin (MINIPRESS) 1 MG capsule take 2 capsules by mouth every morning and 1 capsule every evening and 2 capsules at bedtime 450 capsule 3  . simvastatin (ZOCOR) 20 MG tablet Take 1 tablet (20 mg total) by mouth daily. 90 tablet 1  . tamoxifen (NOLVADEX) 20 MG tablet Take 1 tablet (20 mg total) by mouth daily. 90 tablet 3  . Vitamin D, Ergocalciferol, (DRISDOL) 50000 UNITS CAPS capsule Take 1 capsule (50,000 Units total) by mouth once a week. 12 capsule 0   No facility-administered medications prior to visit.    ROS Review of Systems    Constitutional: Negative for chills, activity change, appetite change, fatigue and unexpected weight change.  HENT: Negative for congestion, mouth sores and sinus pressure.   Eyes: Negative for visual disturbance.  Respiratory: Negative for cough and chest tightness.   Gastrointestinal: Positive for constipation and abdominal distention. Negative for nausea, vomiting, abdominal pain, diarrhea and blood in stool.  Genitourinary: Negative for frequency, flank pain, difficulty urinating and vaginal pain.  Musculoskeletal: Negative for back pain and gait problem.  Skin: Negative for pallor and rash.  Neurological: Negative for dizziness, tremors, weakness, numbness and headaches.  Psychiatric/Behavioral: Negative for confusion and sleep disturbance.    Objective:  BP 160/80 mmHg  Pulse 62  Wt 103 lb (46.72 kg)  SpO2 97%  BP Readings from Last 3 Encounters:  12/05/15 160/80  09/05/15 140/51  07/01/15 149/72    Wt Readings from Last 3 Encounters:  12/05/15 103 lb (46.72 kg)  09/05/15 108 lb 4.8 oz (49.125 kg)  06/29/15 111 lb (50.349 kg)    Physical Exam  Constitutional: She appears well-developed. No distress.  HENT:  Head: Normocephalic.  Right Ear: External ear normal.  Left Ear: External ear normal.  Nose: Nose normal.  Mouth/Throat: Oropharynx is clear and moist.  Eyes: Conjunctivae are normal. Pupils are equal, round, and reactive to light. Right  eye exhibits no discharge. Left eye exhibits no discharge.  Neck: Normal range of motion. Neck supple. No JVD present. No tracheal deviation present. No thyromegaly present.  Cardiovascular: Normal rate, regular rhythm and normal heart sounds.   Pulmonary/Chest: No stridor. No respiratory distress. She has no wheezes.  Abdominal: Soft. Bowel sounds are normal. She exhibits no distension and no mass. There is no tenderness. There is no rebound and no guarding.  Musculoskeletal: She exhibits no edema or tenderness.   Lymphadenopathy:    She has no cervical adenopathy.  Neurological: She displays normal reflexes. No cranial nerve deficit. She exhibits normal muscle tone. Coordination normal.  Skin: No rash noted. No erythema.  Psychiatric: She has a normal mood and affect. Her behavior is normal. Judgment and thought content normal.    Lab Results  Component Value Date   WBC 5.5 07/01/2015   HGB 10.7* 07/01/2015   HCT 30.5* 07/01/2015   PLT 175 07/01/2015   GLUCOSE 121* 07/01/2015   CHOL 127 06/15/2015   TRIG 92.0 06/15/2015   HDL 58.40 06/15/2015   LDLCALC 50 06/15/2015   ALT 29 07/01/2015   AST 38 07/01/2015   NA 129* 07/01/2015   K 4.5 07/01/2015   CL 98* 07/01/2015   CREATININE 1.00 07/01/2015   BUN 8 07/01/2015   CO2 24 07/01/2015   TSH 2.21 06/15/2015   HGBA1C 5.9 06/15/2015    Ct Abdomen Pelvis W Contrast  07/01/2015  CLINICAL DATA:  Abdominal bloating. Low back pain. Personal history of breast cancer 4 years ago. EXAM: CT ABDOMEN AND PELVIS WITH CONTRAST TECHNIQUE: Multidetector CT imaging of the abdomen and pelvis was performed using the standard protocol following bolus administration of intravenous contrast. CONTRAST:  160mL OMNIPAQUE IOHEXOL 300 MG/ML SOLN, 14mL OMNIPAQUE IOHEXOL 300 MG/ML SOLN COMPARISON:  One-view abdomen 06/29/2015 FINDINGS: Lower chest: The lung bases are clear without focal nodule, mass, or airspace disease. The heart size is normal. No significant pleural or pericardial effusion is present. Hepatobiliary: Benign appearing cysts are present. No other focal lesions are evident. The common bile duct and gallbladder are within normal limits for age. Pancreas: Pancreatic atrophy is within normal limits. Spleen: Negative Adrenals/Urinary Tract: The adrenal glands are normal bilaterally. The kidneys and ureters are unremarkable. Urinary bladder is within normal limits. Stomach/Bowel: The a stomach and duodenum are unremarkable. A small hiatal hernia is present. Small  bowel is within normal limits. Oral contrast is seen in the distal small bowel and colon. The appendix is not discretely visualized and may be surgically absent. The more distal colon is unremarkable. The rectosigmoid colon is normal. Vascular/Lymphatic: Atherosclerotic calcifications are present without aneurysm. Dense calcifications are present the proximal right iliac artery with moderate to high-grade stenosis. No significant adenopathy is present. Reproductive: Uterus is mildly enlarged with fibroids. The ovaries are within normal limits for age. Other: There is no significant free fluid. Musculoskeletal: Grade 1 degenerative anterolisthesis is present at L3-4 L4-5. There is mild facet hypertrophy at both levels. No focal lytic or blastic lesions are present. IMPRESSION: 1. No acute or focal lesion to explain the patient's symptoms. 2. No evidence for bowel obstruction or ileus. 3. Moderate atherosclerotic changes with moderate to high-grade stenosis of the proximal right iliac artery. 4. Moderate spondylosis of the lower lumbar spine. Electronically Signed   By: San Morelle M.D.   On: 07/01/2015 17:43    Assessment & Plan:   There are no diagnoses linked to this encounter. I am having Ms. Constantin  maintain her aspirin, fish oil-omega-3 fatty acids, Iron, mometasone, cloNIDine, multivitamin-lutein, diltiazem, GNP CALCIUM 1200, simvastatin, Vitamin D (Ergocalciferol), losartan, prazosin, diazepam, tamoxifen, and metoprolol succinate.  No orders of the defined types were placed in this encounter.     Follow-up: No Follow-up on file.  Walker Kehr, MD

## 2015-12-05 NOTE — Assessment & Plan Note (Signed)
BP Readings from Last 3 Encounters:  12/05/15 160/80  09/05/15 140/51  07/01/15 149/72

## 2015-12-19 ENCOUNTER — Other Ambulatory Visit (INDEPENDENT_AMBULATORY_CARE_PROVIDER_SITE_OTHER): Payer: Medicare Other

## 2015-12-19 ENCOUNTER — Encounter: Payer: Self-pay | Admitting: Internal Medicine

## 2015-12-19 ENCOUNTER — Ambulatory Visit (INDEPENDENT_AMBULATORY_CARE_PROVIDER_SITE_OTHER): Payer: Medicare Other | Admitting: Internal Medicine

## 2015-12-19 VITALS — BP 134/68 | HR 66 | Temp 97.4°F | Resp 18 | Wt 100.0 lb

## 2015-12-19 DIAGNOSIS — E785 Hyperlipidemia, unspecified: Secondary | ICD-10-CM

## 2015-12-19 DIAGNOSIS — R739 Hyperglycemia, unspecified: Secondary | ICD-10-CM

## 2015-12-19 DIAGNOSIS — I10 Essential (primary) hypertension: Secondary | ICD-10-CM | POA: Diagnosis not present

## 2015-12-19 DIAGNOSIS — D509 Iron deficiency anemia, unspecified: Secondary | ICD-10-CM

## 2015-12-19 DIAGNOSIS — K5901 Slow transit constipation: Secondary | ICD-10-CM | POA: Diagnosis not present

## 2015-12-19 LAB — LIPID PANEL
CHOLESTEROL: 144 mg/dL (ref 0–200)
HDL: 57.9 mg/dL (ref >39.00)
LDL CALC: 65 mg/dL (ref 0–99)
NonHDL: 85.94
TRIGLYCERIDES: 107 mg/dL (ref 0.0–149.0)
Total CHOL/HDL Ratio: 2
VLDL: 21.4 mg/dL (ref 0.0–40.0)

## 2015-12-19 LAB — CBC WITH DIFFERENTIAL/PLATELET
Basophils Absolute: 0 10*3/uL (ref 0.0–0.1)
Basophils Relative: 0.4 % (ref 0.0–3.0)
EOS ABS: 0.2 10*3/uL (ref 0.0–0.7)
EOS PCT: 1.9 % (ref 0.0–5.0)
HCT: 33.5 % — ABNORMAL LOW (ref 36.0–46.0)
HEMOGLOBIN: 11.2 g/dL — AB (ref 12.0–15.0)
Lymphocytes Relative: 18.4 % (ref 12.0–46.0)
Lymphs Abs: 1.5 10*3/uL (ref 0.7–4.0)
MCHC: 33.4 g/dL (ref 30.0–36.0)
MCV: 92.6 fl (ref 78.0–100.0)
MONO ABS: 0.9 10*3/uL (ref 0.1–1.0)
MONOS PCT: 11.2 % (ref 3.0–12.0)
Neutro Abs: 5.7 10*3/uL (ref 1.4–7.7)
Neutrophils Relative %: 68.1 % (ref 43.0–77.0)
Platelets: 230 10*3/uL (ref 150.0–400.0)
RBC: 3.62 Mil/uL — AB (ref 3.87–5.11)
RDW: 12.6 % (ref 11.5–15.5)
WBC: 8.4 10*3/uL (ref 4.0–10.5)

## 2015-12-19 LAB — COMPREHENSIVE METABOLIC PANEL
ALK PHOS: 44 U/L (ref 39–117)
ALT: 11 U/L (ref 0–35)
AST: 15 U/L (ref 0–37)
Albumin: 3.8 g/dL (ref 3.5–5.2)
BILIRUBIN TOTAL: 0.5 mg/dL (ref 0.2–1.2)
BUN: 16 mg/dL (ref 6–23)
CO2: 27 mEq/L (ref 19–32)
Calcium: 9.8 mg/dL (ref 8.4–10.5)
Chloride: 101 mEq/L (ref 96–112)
Creatinine, Ser: 0.92 mg/dL (ref 0.40–1.20)
GFR: 61.3 mL/min (ref >60.00)
GLUCOSE: 129 mg/dL — AB (ref 70–99)
Potassium: 4.3 mEq/L (ref 3.5–5.1)
Sodium: 136 mEq/L (ref 135–145)
TOTAL PROTEIN: 7.1 g/dL (ref 6.0–8.3)

## 2015-12-19 LAB — IRON: Iron: 98 ug/dL (ref 42–145)

## 2015-12-19 LAB — TSH: TSH: 2.34 u[IU]/mL (ref 0.35–4.50)

## 2015-12-19 LAB — HEMOGLOBIN A1C: Hgb A1c MFr Bld: 5.9 % (ref 4.6–6.5)

## 2015-12-19 LAB — FERRITIN: Ferritin: 258.1 ng/mL (ref 10.0–291.0)

## 2015-12-19 NOTE — Assessment & Plan Note (Signed)
BP Readings from Last 3 Encounters:  12/19/15 134/68  12/05/15 160/80  09/05/15 140/51   Blood pressure well-controlled here today Continue current medications

## 2015-12-19 NOTE — Assessment & Plan Note (Signed)
She stopped the simvastatin approximately 2 weeks ago Check lipid panel

## 2015-12-19 NOTE — Patient Instructions (Addendum)
Try miralax for your constipation.  Test(s) ordered today. Your results will be released to [place] (or called to you) after review, usually within 72hours after test completion. If any changes need to be made, you will be notified at that same time.  All other Health Maintenance issues reviewed.   All recommended immunizations and age-appropriate screenings are up-to-date or discussed.  No immunizations administered today.   Medications reviewed and updated.  No changes recommended at this time.   Please followup for a wellness visit

## 2015-12-19 NOTE — Assessment & Plan Note (Signed)
Check A1c. 

## 2015-12-19 NOTE — Assessment & Plan Note (Addendum)
Linzess at the high dose is effective, but very expensive She started taking a stool softener and will continue-titrating dose in stool softener Discontinue iron-we will check iron now and recheck at her next visit Trial of MiraLAX daily Discussed that some of her medications may be contributing to the constipation

## 2015-12-19 NOTE — Progress Notes (Signed)
Pre visit review using our clinic review tool, if applicable. No additional management support is needed unless otherwise documented below in the visit note. 

## 2015-12-19 NOTE — Progress Notes (Signed)
Subjective:    Patient ID: Gabrielle Warren, female    DOB: May 12, 1926, 82 y.o.   MRN: [id number]  HPI She is here for follow up.  Constipation:  She has had constipation for a Long time.  She was seen two weeks ago for worsening of her chronic constipation.  She was started on linzess and was advised to hold simvastatin. The low dose of the medication was not effective, but the high dose was. An abdominal xray showed constipation, but no concerning findings.  She drinks plenty of water and takes fiber supplement. She is currently taking an iron supplement, because she was told that she had iron deficiency anemia in the past. She does experience abdominal bloating and fullness and slight discomfort with the constipation. Once she has a good bowel movement the symptoms resolved.  CAD, Hypertension: She is taking her medication daily. She is compliant with a low sodium diet.  She denies chest pain, palpitations, shortness of breath and regular headaches. She is not exercising regularly.  She does not monitor her blood pressure at home.    Memory concerns:  She lives with her daughter and is independent.  She has difficulty with recall.    Medications and allergies reviewed with patient and updated if appropriate.  Patient Active Problem List   Diagnosis Date Noted  . Constipation 12/05/2015  . Macular degeneration 06/15/2015  . Back pain 06/15/2015  . Hyperglycemia 06/15/2015  . Memory difficulties 06/15/2015  . Prediabetes 06/15/2015  . Breast cancer, right breast ([place]) 09/05/2014  . Allergic rhinitis 02/01/2014  . Anemia in neoplastic disease 09/22/2013  . Invasive ductal carcinoma of breast, stage 1 ([place]) 07/18/2011  . CAD (coronary artery disease) 04/26/2011  . Hyperlipidemia 07/03/2009  . BRUIT 07/21/2008  . Anxiety 09/23/2007  . OSTEOARTHRITIS 09/23/2007  . OTHER OSTEOPOROSIS 09/23/2007  . Essential hypertension 07/25/2006    Current Outpatient Prescriptions on File  Prior to Visit  Medication Sig Dispense Refill  . aspirin 81 MG tablet Take 81 mg by mouth daily.      . Calcium Carbonate-Vit D-Min (GNP CALCIUM 1200) 1200-1000 MG-UNIT CHEW Chew 1 tablet by mouth daily.    . cloNIDine (CATAPRES) 0.1 MG tablet Take 1/2 tablet daily if B/P sustains greater than 99991111 systolic 30 tablet 6  . diazepam (VALIUM) 10 MG tablet Take 0.5 tablets (5 mg total) by mouth every 12 (twelve) hours as needed for anxiety. 45 tablet 0  . diltiazem (CARDIZEM CD) 120 MG 24 hr capsule take 1 capsule by mouth once daily 90 capsule 3  . Ferrous Sulfate (IRON) 90 (18 FE) MG TABS Take 2 tablets by mouth daily.      . fish oil-omega-3 fatty acids 1000 MG capsule Take 2 g by mouth daily. 1200 mg    . linaclotide (LINZESS) 290 MCG CAPS capsule Take 1 capsule (290 mcg total) by mouth daily as needed. 30 capsule 11  . losartan (COZAAR) 100 MG tablet Take 1 tablet (100 mg total) by mouth daily. 90 tablet 1  . metoprolol succinate (TOPROL-XL) 25 MG 24 hr tablet TAKE ONE TABLET BY MOUTH TWICE DAILY 180 tablet 1  . mometasone (NASONEX) 50 MCG/ACT nasal spray 2 sprays each nares q day 17 g 0  . multivitamin-lutein (OCUVITE-LUTEIN) CAPS capsule Take 1 capsule by mouth daily.    . prazosin (MINIPRESS) 1 MG capsule take 2 capsules by mouth every morning and 1 capsule every evening and 2 capsules at bedtime 450 capsule 3  .  simvastatin (ZOCOR) 20 MG tablet Take 1 tablet (20 mg total) by mouth daily. 90 tablet 1  . tamoxifen (NOLVADEX) 20 MG tablet Take 1 tablet (20 mg total) by mouth daily. 90 tablet 3  . Vitamin D, Ergocalciferol, (DRISDOL) 50000 UNITS CAPS capsule Take 1 capsule (50,000 Units total) by mouth once a week. 12 capsule 0   No current facility-administered medications on file prior to visit.    Past Medical History  Diagnosis Date  . HYPERTENSION 07/25/2006  . ANXIETY 09/23/2007  . HYPERLIPIDEMIA 07/03/2009  . Atrial tachycardia ([place])   . MVP (mitral valve prolapse)   . Arthritis     . Osteoarthritis   . Breast cancer ([place])   . Anemia 12/10/2011  . Osteoporosis   . Prediabetes 06/15/2015    Past Surgical History  Procedure Laterality Date  . Cataract extraction  04/2000, 03/2002  . Carpal tunnel release  09/2004  . Breast surgery  08/08/11    lumpectomy    Social History   Social History  . Marital Status: Widowed    Spouse Name: N/A  . Number of Children: N/A  . Years of Education: N/A   Social History Main Topics  . Smoking status: Former Smoker    Quit date: 07/08/1994  . Smokeless tobacco: Never Used  . Alcohol Use: Yes  . Drug Use: No  . Sexual Activity:    Partners: Male   Other Topics Concern  . None   Social History Narrative   Exercise--walking some, more in warmer weather    Family History  Problem Relation Age of Onset  . Heart failure Mother   . Stroke Father   . Hypertension Other     Review of Systems  Constitutional: Negative for fever.  Respiratory: Negative for cough, shortness of breath and wheezing.   Cardiovascular: Positive for leg swelling (occasional). Negative for chest pain and palpitations.  Gastrointestinal: Positive for constipation and abdominal distention.  Neurological: Negative for light-headedness and headaches.       Objective:   Filed Vitals:   12/19/15 1100  BP: 134/68  Pulse: 66  Temp: 97.4 F (36.3 C)  Resp: 18   Filed Weights   12/19/15 1100  Weight: 100 lb (45.36 kg)   Body mass index is 24.56 kg/(m^2).   Physical Exam Constitutional: Appears well-developed and well-nourished. No distress.  Neck: Neck supple. No tracheal deviation present. No thyromegaly present.  No carotid bruit. No cervical adenopathy.   Cardiovascular: Normal rate, regular rhythm and normal heart sounds.   2/6 systolic murmur heard.  No edema Pulmonary/Chest: Effort normal and breath sounds normal. No respiratory distress. No wheezes.  Abd: soft, mild tenderness in lower abd w/o rebound or guarding       Assessment & Plan:   See Problem List for Assessment and Plan of chronic medical problems.

## 2015-12-20 ENCOUNTER — Telehealth: Payer: Self-pay | Admitting: *Deleted

## 2015-12-20 MED ORDER — VITAMIN D (ERGOCALCIFEROL) 1.25 MG (50000 UNIT) PO CAPS: 50000.0000 [IU] | ORAL_CAPSULE | ORAL | Status: DC

## 2015-12-20 NOTE — Telephone Encounter (Signed)
Pt request this med to be send to [facility]. Please change location.

## 2015-12-20 NOTE — Addendum Note (Signed)
Addended by: Earnstine Regal on: 12/20/2015 02:56 PM   Modules accepted: Orders

## 2015-12-20 NOTE — Telephone Encounter (Signed)
Resent to [facility].../lmb 

## 2015-12-21 ENCOUNTER — Encounter: Payer: Self-pay | Admitting: Emergency Medicine

## 2015-12-27 ENCOUNTER — Telehealth: Payer: Self-pay | Admitting: Internal Medicine

## 2015-12-27 ENCOUNTER — Ambulatory Visit: Payer: Medicare Other | Admitting: Family

## 2015-12-27 DIAGNOSIS — K5900 Constipation, unspecified: Secondary | ICD-10-CM

## 2015-12-27 DIAGNOSIS — Z0289 Encounter for other administrative examinations: Secondary | ICD-10-CM

## 2015-12-27 NOTE — Telephone Encounter (Signed)
Patient is requesting referral to GI for her stomach issues that she has been in to see Dr. Quay Burow for.

## 2015-12-27 NOTE — Telephone Encounter (Signed)
Patient is seeing Joycelyn Schmid tomorrow.  But still requesting referral.

## 2015-12-28 NOTE — Telephone Encounter (Signed)
Referral ordered

## 2015-12-28 NOTE — Telephone Encounter (Signed)
Please advise, Pt did not show for her appt with Joycelyn Schmid.

## 2016-01-04 ENCOUNTER — Telehealth: Payer: Self-pay | Admitting: Internal Medicine

## 2016-01-04 MED ORDER — DILTIAZEM HCL ER COATED BEADS 120 MG PO CP24: 120.0000 mg | ORAL_CAPSULE | Freq: Every day | ORAL | Status: DC

## 2016-01-04 NOTE — Telephone Encounter (Signed)
Ok to fill 

## 2016-01-04 NOTE — Telephone Encounter (Signed)
Pt called in and needs a refill on her     diltiazem (CARDIZEM CD) 120 MG 24 hr capsule [29270]       diltiazem (CARDIZEM CD) 120 MG 24 hr capsule

## 2016-01-04 NOTE — Telephone Encounter (Signed)
RX sent to [facility] 

## 2016-01-05 ENCOUNTER — Telehealth: Payer: Self-pay | Admitting: Internal Medicine

## 2016-01-05 NOTE — Telephone Encounter (Signed)
Spoke with pt, she is currently taking Diazepam, 1/2 tablet 3xs a daily (morning, early evening, and night) RX is written for 1/2 tab every 12 hours. Pt was very slow to speak on the phone and when I told pt she this was [facility] calling she asked where it was located. Please advise on what sig should be on RX. Looked in history and sig has been 1/2 tab every 12 hours since 08/2014

## 2016-01-05 NOTE — Telephone Encounter (Signed)
Pt called in and said that [facility] does not have this med.  Can you try to resent this in?

## 2016-01-05 NOTE — Telephone Encounter (Signed)
Called RX into pharm, pt notified.

## 2016-01-05 NOTE — Telephone Encounter (Signed)
Advise her she should be taking 1/2 pill twice a day not three times a day

## 2016-01-05 NOTE — Telephone Encounter (Signed)
Diltiazem was sent.  Patient needs diazepam sent to pharmacy.

## 2016-01-26 ENCOUNTER — Other Ambulatory Visit: Payer: Self-pay | Admitting: Emergency Medicine

## 2016-01-26 MED ORDER — SIMVASTATIN 20 MG PO TABS: 20.0000 mg | ORAL_TABLET | Freq: Every day | ORAL | Status: DC

## 2016-01-29 ENCOUNTER — Telehealth: Payer: Self-pay | Admitting: *Deleted

## 2016-01-29 DIAGNOSIS — K5900 Constipation, unspecified: Secondary | ICD-10-CM

## 2016-01-29 NOTE — Telephone Encounter (Signed)
Left msg on triage stating she is having problem w/ being constipated. Her bowels will not move. Took some magnesium citrate was not effective. Pt states her abdomen is swollen, she has no appetite, she is just miserable. Wanting md to rx something for constipation...Johny Chess

## 2016-01-29 NOTE — Telephone Encounter (Signed)
She is very constipated - this is a chronic issue.    Enemas in the past have not been effective - last week they helped a little.   Mag citrate has not helped.     Taking:  Fiber supplement Stool softener - colace - up to 3 a day  Still taking iron - advised her to stop it Hold calcium for now  Try mag citrate/miralax or enema again    Will refer to GI for further evaluation.    Lovena Le  - please call her and see how she is doing.

## 2016-01-30 MED ORDER — POLYETHYLENE GLYCOL 3350 17 G PO PACK: 17.0000 g | PACK | Freq: Every day | ORAL | 3 refills | Status: DC | PRN

## 2016-01-30 NOTE — Telephone Encounter (Signed)
Notified pt w/MD response. Pt states she is about the same did have a little bowel movement, but not much. Will contine that magnesium citrate, and Miralax. Would like rx for the miralax [facility] to [facility]. Sent rc electronically...Johny Chess

## 2016-01-31 NOTE — Progress Notes (Signed)
HPI The patient presents for evaluation of hypertension.  This is a one year follow up.  Since I last saw her her blood pressures have been very good.  They do fluctuate slightly between 120s and 160s but overall her systolics below [id number]. She's not had any cardiac complaints. In particular she's not had any palpitations, presyncope or syncope. She denies any chest pressure, neck or arm discomfort. She's had no weight gain or edema.   Allergies  Allergen Reactions  . Fosamax [Alendronate Sodium] Other (See Comments)    Canker sores and bleeding gums    Current Outpatient Prescriptions  Medication Sig Dispense Refill  . aspirin 81 MG tablet Take 81 mg by mouth daily.      . cloNIDine (CATAPRES) 0.1 MG tablet Take 1/2 tablet daily if B/P sustains greater than 99991111 systolic 30 tablet 6  . diazepam (VALIUM) 10 MG tablet Take 0.5 tablets (5 mg total) by mouth every 12 (twelve) hours as needed for anxiety. 45 tablet 0  . diltiazem (CARDIZEM CD) 120 MG 24 hr capsule Take 1 capsule (120 mg total) by mouth daily. 90 capsule 3  . fish oil-omega-3 fatty acids 1000 MG capsule Take 2 g by mouth daily. 1200 mg    . losartan (COZAAR) 100 MG tablet Take 1 tablet (100 mg total) by mouth daily. 90 tablet 1  . metoprolol succinate (TOPROL-XL) 25 MG 24 hr tablet TAKE ONE TABLET BY MOUTH TWICE DAILY 180 tablet 1  . Misc Natural Products (LUTEIN 20 PO) Take 1 capsule by mouth daily.    . mometasone (NASONEX) 50 MCG/ACT nasal spray 2 sprays each nares q day 17 g 0  . polyethylene glycol (MIRALAX / GLYCOLAX) packet Take 17 g by mouth daily as needed for severe constipation. 30 each 3  . prazosin (MINIPRESS) 1 MG capsule take 2 capsules by mouth every morning and 1 capsule every evening and 2 capsules at bedtime 450 capsule 3  . simvastatin (ZOCOR) 20 MG tablet Take 1 tablet (20 mg total) by mouth daily. 90 tablet 1  . tamoxifen (NOLVADEX) 20 MG tablet Take 1 tablet (20 mg total) by mouth daily. 90 tablet 3  .  Vitamin D, Ergocalciferol, (DRISDOL) 50000 units CAPS capsule Take 1 capsule (50,000 Units total) by mouth once a week. 12 capsule 0  . Calcium Carbonate-Vit D-Min (GNP CALCIUM 1200) 1200-1000 MG-UNIT CHEW Chew 1 tablet by mouth daily.    . multivitamin-lutein (OCUVITE-LUTEIN) CAPS capsule Take 1 capsule by mouth daily.     No current facility-administered medications for this visit.     Past Medical History:  Diagnosis Date  . Anemia 12/10/2011  . ANXIETY 09/23/2007  . Arthritis   . Atrial tachycardia ([place])   . Breast cancer ([place])   . HYPERLIPIDEMIA 07/03/2009  . HYPERTENSION 07/25/2006  . MVP (mitral valve prolapse)   . Osteoarthritis   . Osteoporosis   . Prediabetes 06/15/2015    Past Surgical History:  Procedure Laterality Date  . BREAST SURGERY  08/08/11   lumpectomy  . CARPAL TUNNEL RELEASE  09/2004  . CATARACT EXTRACTION  04/2000, 03/2002   ROS  Positive for hip pain and constipation. Otherwise as stated in the HPI and negative for all other systems.  PHYSICAL EXAM BP (!) 134/56   Pulse 62   Ht 4' 5.5" (1.359 m)   Wt 102 lb (46.3 kg)   BMI 25.06 kg/m  GENERAL:  Well appearing, looks younger than stated age  NECK:  No jugular  venous distention, waveform within normal limits, carotid upstroke brisk and symmetric, no bruits, no thyromegaly LUNGS:  Clear to auscultation bilaterally CHEST:  Unremarkable HEART:  PMI not displaced or sustained,S1 and S2 within normal limits, no S3, no S4, no clicks, no rubs, no murmurs ABD:  Flat, positive bowel sounds normal in frequency in pitch, no bruits, no rebound, no guarding, no midline pulsatile mass, no hepatomegaly, no splenomegaly EXT:  2 plus pulses throughout, no edema, no cyanosis no clubbing  EKG:  Sinus rhythm, rate 62, axis within normal limits, intervals within normal limits, sinus arrhythmia. Nonspecific ST T wave flattening.  02/01/2016   ASSESSMENT AND PLAN  HYPERTENSION -  Patient's blood pressure is well controlled.  No change in therapy is indicated. Continue the meds as listed.  PALPITATIONS -  She is not particularly bothered by this.  She will continue on the meds as listed.  No change in therapy or other testing is indicated.

## 2016-02-01 ENCOUNTER — Ambulatory Visit (INDEPENDENT_AMBULATORY_CARE_PROVIDER_SITE_OTHER): Payer: Medicare Other | Admitting: Cardiology

## 2016-02-01 ENCOUNTER — Encounter: Payer: Self-pay | Admitting: Cardiology

## 2016-02-01 ENCOUNTER — Encounter: Payer: Self-pay | Admitting: Gastroenterology

## 2016-02-01 VITALS — BP 134/56 | HR 62 | Ht <= 58 in | Wt 102.0 lb

## 2016-02-01 DIAGNOSIS — I10 Essential (primary) hypertension: Secondary | ICD-10-CM | POA: Diagnosis not present

## 2016-02-01 NOTE — Patient Instructions (Signed)
Your physician wants you to follow-up in: 1 Year. You will receive a reminder letter in the mail two months in advance. If you don't receive a letter, please call our office to schedule the follow-up appointment.  

## 2016-02-03 ENCOUNTER — Other Ambulatory Visit: Payer: Self-pay | Admitting: Cardiology

## 2016-02-05 ENCOUNTER — Telehealth: Payer: Self-pay | Admitting: Emergency Medicine

## 2016-02-05 ENCOUNTER — Telehealth: Payer: Self-pay | Admitting: Gastroenterology

## 2016-02-05 MED ORDER — LOSARTAN POTASSIUM 100 MG PO TABS: 100.0000 mg | ORAL_TABLET | Freq: Every day | ORAL | 1 refills | Status: DC

## 2016-02-05 NOTE — Telephone Encounter (Signed)
RX sent

## 2016-02-05 NOTE — Telephone Encounter (Signed)
Pt needs a refill on losartan (COZAAR) 100 MG tablet. Pharmacy is [facility] on [address]. Please follow up thanks.

## 2016-02-05 NOTE — Telephone Encounter (Signed)
Spoke with the patient. She states right now her concern is her BP and she cannot take her pulse. She wants to contact her cardiologist first. Her daughter is with her per patient's statement.

## 2016-02-06 ENCOUNTER — Ambulatory Visit (INDEPENDENT_AMBULATORY_CARE_PROVIDER_SITE_OTHER): Payer: Medicare Other | Admitting: Physician Assistant

## 2016-02-06 ENCOUNTER — Encounter: Payer: Self-pay | Admitting: Physician Assistant

## 2016-02-06 VITALS — BP 136/60 | HR 68 | Ht <= 58 in | Wt 103.4 lb

## 2016-02-06 DIAGNOSIS — K5900 Constipation, unspecified: Secondary | ICD-10-CM

## 2016-02-06 NOTE — Progress Notes (Signed)
Chief Complaint: Constipation  HPI:  Gabrielle Warren is an 82 year old Caucasian female who presents to clinic today accompanied by her daughter,  who was referred to me by Binnie Rail, MD for a complaint of constipation.  Today, the patient expresses that for longer than a month she has been experiencing extreme constipation, noting that all she has for bowel movements is a small amount of loose stool which she has been told is "leaking around a lot of solid stool". It should be noted that she is somewhat of a poor historian. She does refer to an x-ray which was taken a couple of months ago which showed a lot of stool in her colon. 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 Patient expresses that she has been using MiraLAX once daily over the past week and last night took 2 Colace as well and this morning had a larger "thicker bowel movement". Patient expresses that after this morning's BM, she does feel somewhat relieved from her abdominal distention and generalized abdominal bloating pain. Patient also uses Metamucil on a daily basis. Per her daughter she also was "impacted" over the weekend and took 2 suppositories on Sunday which helped to relieve some of this "blocked stool".  Patient denies fever, chills, blood in her stool, melena, weight loss, fatigue, anorexia, nausea, vomiting or symptoms that awaken her at night.    Past Medical History:  Diagnosis Date  . Anemia 12/10/2011  . ANXIETY 09/23/2007  . Arthritis   . Atrial tachycardia ([place])   . Breast cancer ([place])   . HYPERLIPIDEMIA 07/03/2009  . HYPERTENSION 07/25/2006  . MVP (mitral valve prolapse)   . Osteoarthritis   . Osteoporosis   . Prediabetes 06/15/2015    Past Surgical History:  Procedure Laterality Date  . BREAST SURGERY  08/08/11   lumpectomy  . CARPAL TUNNEL RELEASE  09/2004  . CATARACT EXTRACTION  04/2000, 03/2002    Current Outpatient Prescriptions  Medication Sig Dispense Refill  . Calcium Carbonate-Vit D-Min (GNP CALCIUM 1200) 1200-1000 MG-UNIT CHEW Chew 1 tablet by mouth daily.    . cloNIDine (CATAPRES) 0.1 MG tablet Take 1/2 tablet daily if B/P sustains greater than 99991111 systolic 30 tablet 6  . diazepam (VALIUM) 10 MG tablet Take 0.5 tablets (5 mg total) by mouth every 12 (twelve) hours as needed for anxiety. 45 tablet 0  . diltiazem (CARDIZEM CD) 120 MG 24 hr capsule Take 1 capsule (120 mg total) by mouth daily. 90 capsule 3  . fish oil-omega-3 fatty acids 1000 MG capsule Take 2 g by mouth daily. 1200 mg    . losartan (COZAAR) 100 MG tablet Take 1 tablet (100 mg total) by mouth daily. 90 tablet 1  . metoprolol succinate (TOPROL-XL) 25 MG 24 hr tablet TAKE ONE TABLET BY MOUTH TWICE DAILY 180 tablet 1  . Misc Natural Products (LUTEIN 20 PO) Take 1 capsule by mouth daily.    . mometasone (NASONEX) 50 MCG/ACT nasal spray 2 sprays each nares q day 17 g 0  . multivitamin-lutein (OCUVITE-LUTEIN) CAPS capsule Take 1 capsule by mouth daily.    . polyethylene glycol (MIRALAX / GLYCOLAX) packet Take 17 g by mouth daily as needed for severe constipation. 30 each 3  . prazosin (MINIPRESS) 1 MG capsule take 2 capsules by mouth every morning and  1 capsule every evening and 2 capsules at bedtime 450 capsule 3  . tamoxifen (NOLVADEX) 20 MG tablet Take 1 tablet (20 mg total) by mouth daily. 90 tablet 3  . Vitamin D, Ergocalciferol, (DRISDOL) 50000 units CAPS capsule Take 1 capsule (50,000 Units total) by mouth once a week. 12 capsule 0  . simvastatin (ZOCOR) 20 MG tablet Take 1 tablet (20 mg total) by mouth daily. (Patient not taking: Reported on 02/06/2016) 90 tablet 1   No current facility-administered medications for this visit.     Allergies as of 02/06/2016 - Review Complete 02/06/2016  Allergen Reaction Noted  . Fosamax  [alendronate sodium] Other (See Comments) 07/17/2011    Family History  Problem Relation Age of Onset  . Heart failure Mother   . Stroke Father   . Hypertension Other   . Colon cancer Neg Hx     Social History   Social History  . Marital status: Widowed    Spouse name: N/A  . Number of children: N/A  . Years of education: N/A   Occupational History  . Not on file.   Social History Main Topics  . Smoking status: Former Smoker    Quit date: 07/08/1994  . Smokeless tobacco: Never Used  . Alcohol use Yes  . Drug use: No  . Sexual activity: Not Currently    Partners: Male   Other Topics Concern  . Not on file   Social History Narrative   Exercise--walking some, more in warmer weather    Review of Systems:    Constitutional: No weight loss, fever, chills, weakness or fatigue HEENT: Eyes: No Change in vision               Ears, Nose, Throat:  No change in hearing Skin: No rash or itching Cardiovascular: No chest pain, chest pressure or palpitations    Respiratory: No SOB or cough Gastrointestinal: See HPI and otherwise negative Genitourinary: No dysuria or change in urinary frequency Neurological: No headache or dizziness Musculoskeletal: No new muscle or back pain Hematologic: No bleeding Psychiatric: No history of depression or anxiety   Physical Exam:  Vital signs: BP 136/60   Pulse 68   Ht 4\' 7"  (1.397 m) Comment: w/o shoes  Wt 103 lb 6 oz (46.9 kg)   BMI 24.03 kg/m   General:   Pleasant Elderly Caucasian female appears to be in NAD, Well developed, Well nourished, alert and cooperative Head:  Normocephalic and atraumatic. Eyes:   PEERL, EOMI. No icterus. Conjunctiva pink. Ears:  Normal auditory acuity. Neck:  Supple Throat: Oral cavity and pharynx without inflammation, swelling or lesion.  Lungs: Respirations even and unlabored. Lungs clear to auscultation bilaterally.   No wheezes, crackles, or rhonchi.  Heart: Normal S1, S2. No MRG. Regular rate and  rhythm. No peripheral edema, cyanosis or pallor.  Abdomen:  Soft, mild distention, nontender. No rebound or guarding. Normal bowel sounds. No appreciable masses or hepatomegaly. Rectal:  Not performed.  Msk:  Symmetrical without gross deformities. Peripheral pulses intact.  Extremities:  Without edema, no deformity or joint abnormality. Neurologic:  Alert and  oriented x4;  grossly normal neurologically.  Skin:   Dry and intact without significant lesions or rashes. Psychiatric: Oriented to person, place and time. Demonstrates good judgement and reason without abnormal affect or behaviors.  RELEVANT LABS AND IMAGING: CLINICAL DATA:  Ten days of constipation and abdominal distention and anorexia. History of chronic constipation.  EXAM: ABDOMEN - 2 VIEW  COMPARISON:  Supine  abdominal radiograph of June 29, 2015  FINDINGS: The colonic stool burden is moderate. There is no small or large bowel obstructive pattern. There is no evidence of fecal impaction. There is stable mild degenerative change of the lumbar spine at multiple levels. There is new deformity of the parasymphyseal portion of the superior and inferior pubic rami on the right. The hip joint spaces are reasonably well-maintained. The lung bases are clear.  IMPRESSION: Mildly increased colonic stool burden consistent with clinical constipation. There is no small or large bowel obstructive pattern and no evidence of perforation.  New deformity of the parasymphyseal portion of the right superior and inferior pubic rami sense December 2016 consistent with fractures. Correlation with any symptoms and history of a fall is needed.   Electronically Signed   By: David  Martinique M.D.   On: 12/05/2015 17:12   Assessment: 1. Constipation: Chronic for the patient over the past few years, daughter reports intermittent use of laxatives for a very long time, recently increase in symptoms over the past month, now relieved  with a mixture of MiraLAX and stool softeners; question of delayed transit in the past, this is likely related as well as current medication side effects  Plan: 1. Recommend the patient continue MiraLAX. We did discuss that she can use this up to 4 times a day if necessary. This can be titrated to 1-2 soft formed bowel movements per day. Patient should discontinue this if she develops diarrhea. 2. Patient to continue increased exercise, drinking 6-8 8 ounce glasses of water per day and her fiber supplement. 3. If the above measures do not work, patient can also use a stool softener 4. Patient does not wish to have a follow-up appointment with Dr. Silverio Decamp, we will cancel. I did tell her I would prefer if we had a follow-up of some sort, we will arrange one in 3-4 weeks with me. I explained to the patient that she can cancel this appointment if she is doing well at that time.  Ellouise Newer, PA-C [facility] 02/06/2016, 3:49 PM  Cc: Binnie Rail, MD

## 2016-02-06 NOTE — Patient Instructions (Signed)
Nice to meet you today.    Continue your miralax.    Call us back in a couple of weeks and get a return appointment with Ellouise Newer PA-C.     I appreciate the opportunity to care for you.

## 2016-02-07 NOTE — Progress Notes (Signed)
Reviewed and agree with documentation and assessment and plan. K. Veena Nandigam , MD   

## 2016-02-09 ENCOUNTER — Ambulatory Visit: Payer: Medicare Other | Admitting: Gastroenterology

## 2016-02-09 ENCOUNTER — Other Ambulatory Visit: Payer: Self-pay | Admitting: Internal Medicine

## 2016-02-10 ENCOUNTER — Emergency Department (HOSPITAL_COMMUNITY)
Admission: EM | Admit: 2016-02-10 | Discharge: 2016-02-10 | Disposition: A | Discharge status: Home / Self Care | Payer: Medicare Other | Attending: Physician Assistant | Admitting: Physician Assistant

## 2016-02-10 ENCOUNTER — Encounter (HOSPITAL_COMMUNITY): Payer: Self-pay | Admitting: *Deleted

## 2016-02-10 ENCOUNTER — Emergency Department (HOSPITAL_COMMUNITY): Payer: Medicare Other

## 2016-02-10 DIAGNOSIS — Z853 Personal history of malignant neoplasm of breast: Secondary | ICD-10-CM | POA: Insufficient documentation

## 2016-02-10 DIAGNOSIS — Z79899 Other long term (current) drug therapy: Secondary | ICD-10-CM | POA: Insufficient documentation

## 2016-02-10 DIAGNOSIS — K5900 Constipation, unspecified: Secondary | ICD-10-CM | POA: Diagnosis not present

## 2016-02-10 DIAGNOSIS — Z87891 Personal history of nicotine dependence: Secondary | ICD-10-CM | POA: Diagnosis not present

## 2016-02-10 DIAGNOSIS — I2510 Atherosclerotic heart disease of native coronary artery without angina pectoris: Secondary | ICD-10-CM | POA: Insufficient documentation

## 2016-02-10 DIAGNOSIS — I10 Essential (primary) hypertension: Secondary | ICD-10-CM | POA: Diagnosis not present

## 2016-02-10 LAB — CBC WITH DIFFERENTIAL/PLATELET
Basophils Absolute: 0 10*3/uL (ref 0.0–0.1)
Basophils Relative: 1 %
EOS ABS: 0.1 10*3/uL (ref 0.0–0.7)
EOS PCT: 1 %
HCT: 30.7 % — ABNORMAL LOW (ref 36.0–46.0)
Hemoglobin: 10.7 g/dL — ABNORMAL LOW (ref 12.0–15.0)
LYMPHS ABS: 1.2 10*3/uL (ref 0.7–4.0)
Lymphocytes Relative: 19 %
MCH: 31.6 pg (ref 26.0–34.0)
MCHC: 34.9 g/dL (ref 30.0–36.0)
MCV: 90.6 fL (ref 78.0–100.0)
MONO ABS: 0.9 10*3/uL (ref 0.1–1.0)
MONOS PCT: 14 %
Neutro Abs: 4.2 10*3/uL (ref 1.7–7.7)
Neutrophils Relative %: 65 %
PLATELETS: 191 10*3/uL (ref 150–400)
RBC: 3.39 MIL/uL — ABNORMAL LOW (ref 3.87–5.11)
RDW: 12.5 % (ref 11.5–15.5)
WBC: 6.4 10*3/uL (ref 4.0–10.5)

## 2016-02-10 LAB — COMPREHENSIVE METABOLIC PANEL
ALBUMIN: 3.7 g/dL (ref 3.5–5.0)
ALK PHOS: 43 U/L (ref 38–126)
ALT: 14 U/L (ref 14–54)
AST: 19 U/L (ref 15–41)
Anion gap: 9 (ref 5–15)
BILIRUBIN TOTAL: 0.5 mg/dL (ref 0.3–1.2)
BUN: 7 mg/dL (ref 6–20)
CALCIUM: 9 mg/dL (ref 8.9–10.3)
CO2: 26 mmol/L (ref 22–32)
Chloride: 100 mmol/L — ABNORMAL LOW (ref 101–111)
Creatinine, Ser: 0.87 mg/dL (ref 0.44–1.00)
GFR calc Af Amer: >60 mL/min (ref >60)
GFR, EST NON AFRICAN AMERICAN: 58 mL/min — AB (ref >60)
GLUCOSE: 134 mg/dL — AB (ref 65–99)
POTASSIUM: 3.9 mmol/L (ref 3.5–5.1)
Sodium: 135 mmol/L (ref 135–145)
TOTAL PROTEIN: 6.5 g/dL (ref 6.5–8.1)

## 2016-02-10 NOTE — ED Provider Notes (Signed)
[facility] DEPT Provider Note   CSN: [id number] Arrival date & time: 02/10/16  1010  First Provider Contact:  None       History   Chief Complaint Chief Complaint  Patient presents with  . Constipation    HPI Gabrielle Warren is a 82 y.o. female.  HPI Gabrielle Warren is a 82 y.o. female history of hypertension, hyperlipidemia, anxiety, here for evaluation of constipation. Patient reports he was diagnosed with IBS seen by her PCP, started on MiraLAX and Colace which seemed to be somewhat helpful. However, she feels she has been increasingly more constipated, feels like she has "an obstruction" in her rectum. She reports bowel movements over the past week have been liquid and orange. She denies any fevers, chills, nausea or vomiting, hematochezia, melena, distention or abdominal pain. No other modifying factors.  Past Medical History:  Diagnosis Date  . Anemia 12/10/2011  . ANXIETY 09/23/2007  . Arthritis   . Atrial tachycardia ([place])   . Breast cancer ([place])   . HYPERLIPIDEMIA 07/03/2009  . HYPERTENSION 07/25/2006  . MVP (mitral valve prolapse)   . Osteoarthritis   . Osteoporosis   . Prediabetes 06/15/2015    Patient Active Problem List   Diagnosis Date Noted  . Constipation 12/05/2015  . Macular degeneration 06/15/2015  . Back pain 06/15/2015  . Hyperglycemia 06/15/2015  . Memory difficulties 06/15/2015  . Prediabetes 06/15/2015  . Breast cancer, right breast ([place]) 09/05/2014  . Allergic rhinitis 02/01/2014  . Anemia in neoplastic disease 09/22/2013  . Invasive ductal carcinoma of breast, stage 1 ([place]) 07/18/2011  . CAD (coronary artery disease) 04/26/2011  . Hyperlipidemia 07/03/2009  . BRUIT 07/21/2008  . Anxiety 09/23/2007  . OSTEOARTHRITIS 09/23/2007  . OTHER OSTEOPOROSIS 09/23/2007  . Essential hypertension 07/25/2006    Past Surgical History:  Procedure Laterality Date  . BREAST SURGERY  08/08/11   lumpectomy  . CARPAL TUNNEL RELEASE  09/2004    . CATARACT EXTRACTION  04/2000, 03/2002    OB History    No data available       Home Medications    Prior to Admission medications   Medication Sig Start Date End Date Taking? Authorizing Provider  Calcium Carbonate-Vit D-Min (GNP CALCIUM 1200) 1200-1000 MG-UNIT CHEW Chew 1 tablet by mouth 2 (two) times daily.    Yes Historical Provider, MD  cloNIDine (CATAPRES) 0.1 MG tablet Take 1/2 tablet daily if B/P sustains greater than 99991111 systolic [id number]  Yes Minus Breeding, MD  diazepam (VALIUM) 10 MG tablet Take 0.5 tablets (5 mg total) by mouth every 12 (twelve) hours as needed for anxiety. 11/14/15  Yes Binnie Rail, MD  diltiazem (CARDIZEM CD) 120 MG 24 hr capsule Take 1 capsule (120 mg total) by mouth daily. 01/04/16  Yes Binnie Rail, MD  fish oil-omega-3 fatty acids 1000 MG capsule Take 2 g by mouth daily.    Yes Historical Provider, MD  losartan (COZAAR) 100 MG tablet Take 1 tablet (100 mg total) by mouth daily. 02/05/16  Yes Binnie Rail, MD  metoprolol succinate (TOPROL-XL) 25 MG 24 hr tablet TAKE ONE TABLET BY MOUTH TWICE DAILY 12/05/15  Yes Binnie Rail, MD  Misc Natural Products (LUTEIN 20 PO) Take 1 capsule by mouth daily.   Yes Historical Provider, MD  mometasone (NASONEX) 50 MCG/ACT nasal spray 2 sprays each nares q day 02/01/14  Yes Edward Saguier, PA-C  multivitamin-lutein (OCUVITE-LUTEIN) CAPS capsule Take 1 capsule by mouth daily.   Yes Historical Provider,  MD  polyethylene glycol (MIRALAX / GLYCOLAX) packet Take 17 g by mouth daily as needed for severe constipation. 01/30/16  Yes Binnie Rail, MD  prazosin (MINIPRESS) 1 MG capsule take 2 capsules by mouth every morning and 1 capsule every evening and 2 capsules at bedtime 10/17/15  Yes Binnie Rail, MD  tamoxifen (NOLVADEX) 20 MG tablet Take 1 tablet (20 mg total) by mouth daily. 11/15/15  Yes Chauncey Cruel, MD  Vitamin D, Ergocalciferol, (DRISDOL) 50000 units CAPS capsule Take 1 capsule (50,000 Units total) by mouth once  a week. 12/20/15  Yes Binnie Rail, MD  simvastatin (ZOCOR) 20 MG tablet Take 1 tablet (20 mg total) by mouth daily. Patient not taking: Reported on 02/06/2016 01/26/16   Binnie Rail, MD    Family History Family History  Problem Relation Age of Onset  . Heart failure Mother   . Stroke Father   . Hypertension Other   . Colon cancer Neg Hx     Social History Social History  Substance Use Topics  . Smoking status: Former Smoker    Quit date: 07/08/1994  . Smokeless tobacco: Never Used  . Alcohol use Yes     Allergies   Fosamax [alendronate sodium]   Review of Systems Review of Systems  A 10 point review of systems was completed and was negative except for pertinent positives and negatives as mentioned in the history of present illness   Physical Exam Updated Vital Signs BP 159/69 (BP Location: Right Arm)   Pulse 66   Temp 98.5 F (36.9 C) (Oral)   Resp 18   Ht 4\' 11"  (1.499 m)   Wt 46.7 kg   SpO2 98%   BMI 20.80 kg/m   Physical Exam  Constitutional: She appears well-developed. No distress.  Awake, alert and nontoxic in appearance  HENT:  Head: Normocephalic and atraumatic.  Right Ear: External ear normal.  Left Ear: External ear normal.  Mouth/Throat: Oropharynx is clear and moist.  Eyes: Conjunctivae and EOM are normal. Pupils are equal, round, and reactive to light.  Neck: Normal range of motion. No JVD present.  Cardiovascular: Normal rate, regular rhythm and normal heart sounds.   Pulmonary/Chest: Effort normal and breath sounds normal. No stridor.  Abdominal: Soft. She exhibits no distension and no mass. There is no tenderness. There is no rebound and no guarding. No hernia.  Genitourinary:  Genitourinary Comments: Female chaperone present for rectal exam. Patient with nonthrombosed hemorrhoids, skin tags. Rectal vault empty  Musculoskeletal: Normal range of motion.  Neurological:  Awake, alert, cooperative and aware of situation; motor strength  bilaterally; sensation normal to light touch bilaterally; no facial asymmetry; tongue midline; major cranial nerves appear intact;  baseline gait without new ataxia.  Skin: No rash noted. She is not diaphoretic.  Psychiatric: She has a normal mood and affect. Her behavior is normal. Thought content normal.  Nursing note and vitals reviewed.    ED Treatments / Results  Labs (all labs ordered are listed, but only abnormal results are displayed) Labs Reviewed  COMPREHENSIVE METABOLIC PANEL - Abnormal; Notable for the following:       Result Value   Chloride 100 (*)    Glucose, Bld 134 (*)    GFR calc non Af Amer 58 (*)    All other components within normal limits  CBC WITH DIFFERENTIAL/PLATELET - Abnormal; Notable for the following:    RBC 3.39 (*)    Hemoglobin 10.7 (*)    HCT  30.7 (*)    All other components within normal limits    EKG  EKG Interpretation None       Radiology Dg Abd Acute W/chest  Result Date: 02/10/2016 CLINICAL DATA:  Constipation. EXAM: DG ABDOMEN ACUTE W/ 1V CHEST COMPARISON:  12/05/2015 FINDINGS: Heart size is normal. There is no pleural or pericardial effusion. No airspace consolidation identified. Aortic atherosclerosis is noted. The scratch set there is gas identified throughout the colon. Fluid levels on the upright images are identified indicating presence of liquid stool. No dilated small bowel loops identified. IMPRESSION: 1. Nonobstructive bowel gas pattern. 2. No active cardiopulmonary abnormalities. Electronically Signed   By: Kerby Moors M.D.   On: 02/10/2016 12:59    Procedures Procedures (including critical care time)  Medications Ordered in ED Medications - No data to display   Initial Impression / Assessment and Plan / ED Course  I have reviewed the triage vital signs and the nursing notes.  Pertinent labs & imaging results that were available during my care of the patient were reviewed by me and considered in my medical decision  making (see chart for details).  Clinical Course    Patient reportedly diagnosed with IBS C, increasing constipation over the past one week. No nausea, vomiting, hematochezia or melena. On arrival, afebrile, hemodynamically stable, benign physical exam. Rectal exam reassuring. Empty vault. We'll obtain screening labs, DG abdomen for further appreciation of constipation. Patient declines CT scan, prefers x-ray and I feel this is reasonable.  XR abd shows liquid stool, but no obstructive process. States has GI follow-up appointment in October. We'll give referral to GI for sooner follow-up.  Overall, appears very well, nontoxic, hemodynamically stable and appropriate for outpatient follow-up. Discussed increased MiraLAX, fiber intake. Follow up PCP. Prior to patient discharge, I discussed and reviewed this case with Dr. Thomasene Lot  Final Clinical Impressions(s) / ED Diagnoses   Final diagnoses:  Constipation, unspecified constipation type    New Prescriptions New Prescriptions   No medications on file     Comer Locket, PA-C 02/10/16 [place], MD 02/10/16 1821

## 2016-02-10 NOTE — Discharge Instructions (Signed)
Follow-up with your doctor and gastroenterology for further evaluation and management of your symptoms. You may increase your MiraLAX dosage as we discussed. He may also increase her fiber intake to help bulk your stool. Return to ED for any new or worsening symptoms as we discussed.

## 2016-02-10 NOTE — ED Triage Notes (Signed)
Pt reports she was dx with IBS about a month ago with constipation.  Have been taking a laxative on her own and has been having these "black spurts."  Last "spurt" was this am.  Pt is concerned that she might have a bowel obstruction.  Denies any abd pain or n/v at this time.

## 2016-03-07 ENCOUNTER — Other Ambulatory Visit: Payer: Self-pay | Admitting: Internal Medicine

## 2016-03-18 ENCOUNTER — Telehealth: Payer: Self-pay | Admitting: Emergency Medicine

## 2016-03-18 NOTE — Telephone Encounter (Signed)
Can we have the no show fee taken off from 12/27/15. It was her first no show and patient was unaware of appointment. Thanks.

## 2016-03-27 ENCOUNTER — Other Ambulatory Visit: Payer: Self-pay | Admitting: Oncology

## 2016-03-27 DIAGNOSIS — Z853 Personal history of malignant neoplasm of breast: Secondary | ICD-10-CM

## 2016-03-28 ENCOUNTER — Telehealth: Payer: Self-pay | Admitting: Cardiology

## 2016-03-28 ENCOUNTER — Telehealth: Payer: Self-pay | Admitting: *Deleted

## 2016-03-28 MED ORDER — SIMVASTATIN 20 MG PO TABS: 10.0000 mg | ORAL_TABLET | Freq: Every day | ORAL | 1 refills | Status: DC

## 2016-03-28 NOTE — Telephone Encounter (Signed)
Returned call to patient-reports she has spasms in her legs sometimes and wants to know if she can decrease her medication (simvastatin).  Advised PCP Dr. Quay Burow is prescribing and overseeing her cholesterol at this time, deferred to PCP for recommendations.  Pt verbalized understanding.

## 2016-03-28 NOTE — Telephone Encounter (Signed)
Rec'd call pt is wanting to know can she take 1/2 of the simvastatin because she is having muscle cramps she describe them like charlie horses in her legs...Gabrielle Warren

## 2016-03-28 NOTE — Telephone Encounter (Signed)
Yes, ok to try 1/2 of a pill.  Med list updated.

## 2016-03-28 NOTE — Telephone Encounter (Signed)
Pt c/o medication issue:  1. Name of Medication: simvastatin  2. How are you currently taking this medication (dosage and times per day)? 20mg  1xday  3. Are you having a reaction (difficulty breathing--STAT)? no  4. What is your medication issue? Pt wants to cut it in half and take it because it is giving her muscle spasms in her legs like a [facility]

## 2016-03-28 NOTE — Telephone Encounter (Signed)
Notified pt w/MD response.../lmb 

## 2016-03-29 NOTE — Telephone Encounter (Signed)
email sent to billing to remove no show fee

## 2016-04-02 ENCOUNTER — Other Ambulatory Visit: Payer: Self-pay | Admitting: Cardiology

## 2016-04-02 ENCOUNTER — Other Ambulatory Visit: Payer: Self-pay | Admitting: Internal Medicine

## 2016-04-02 NOTE — Telephone Encounter (Signed)
RX faxed to [facility] 

## 2016-04-17 ENCOUNTER — Ambulatory Visit: Payer: Medicare Other | Admitting: Gastroenterology

## 2016-05-13 ENCOUNTER — Other Ambulatory Visit: Payer: Self-pay | Admitting: Internal Medicine

## 2016-05-14 NOTE — Telephone Encounter (Signed)
Rec'd call pt requesting status on her Diazepam refill...Gabrielle Warren

## 2016-05-14 NOTE — Telephone Encounter (Signed)
RX faxed to [facility] 

## 2016-05-27 ENCOUNTER — Other Ambulatory Visit: Payer: Self-pay | Admitting: Internal Medicine

## 2016-06-11 ENCOUNTER — Other Ambulatory Visit: Payer: Self-pay | Admitting: Internal Medicine

## 2016-06-11 ENCOUNTER — Encounter: Payer: Medicare Other | Admitting: Internal Medicine

## 2016-06-27 ENCOUNTER — Other Ambulatory Visit: Payer: Self-pay | Admitting: Internal Medicine

## 2016-06-27 ENCOUNTER — Ambulatory Visit
Admission: RE | Admit: 2016-06-27 | Discharge: 2016-06-27 | Disposition: A | Discharge status: Home / Self Care | Payer: Medicare Other | Source: Ambulatory Visit | Attending: Oncology | Admitting: Oncology

## 2016-06-27 DIAGNOSIS — Z853 Personal history of malignant neoplasm of breast: Secondary | ICD-10-CM

## 2016-06-27 NOTE — Telephone Encounter (Signed)
[place] controlled substance database checked.  Ok to fill medication.  

## 2016-06-27 NOTE — Telephone Encounter (Signed)
Called refill into [facility] spoke w/Terry gave MD approval.../lmb

## 2016-07-27 ENCOUNTER — Other Ambulatory Visit: Payer: Self-pay | Admitting: Internal Medicine

## 2016-08-04 NOTE — Progress Notes (Addendum)
Subjective:    Patient ID: Gabrielle Warren, female    DOB: Sep 12, 1925, 83 y.o.   MRN: [id number]  HPI Here for medicare wellness exam and follow up of her chronic medical problems.   I have personally reviewed and have noted 1.The patient's medical and social history 2.Their use of alcohol, tobacco or illicit drugs 3.Their current medications and supplements 4.The patient's functional ability including ADL's, fall risks, home safety risks and hearing or visual impairment. 5.Diet and physical activities 6.Evidence for depression or mood disorders 7.Care team reviewed - oncology - Dr Jana Hakim, ophthal - Dr Worthy Keeler - Dr Silverio Decamp  Anxiety:  She takes valium twice a day.  It works well and she denies effects.   Chronic back pain:  She is not a surgical candidate and has daily back pain.  She also has hip bursitis.    Hypertension, Hyperlipidemia: She is taking her medication daily. She is compliant with a low sodium diet.  She denies chest pain, palpitations, edema, shortness of breath and regular headaches. She is exercising regularly.  She does not monitor her blood pressure at home.     She lives with her daughter, but is fairly independent in her ADL's.  She does not manage her finances.  She uses a Rolator to ambulate but not 100% of the time.   Are there smokers in your home (other than you)? No  Risk Factors Exercise: she walks for exercise Dietary issues discussed: well balanced, appetite low- normal  Cardiac risk factors: advanced age, hypertension, hyperlipidemia  Depression Screen  Have you felt down, depressed or hopeless? No  Have you felt little interest or pleasure in doing things?  No  Activities of Daily Living In your present state of health, do you have any difficulty performing the following activities?:  Driving? Yes - no longer drives Managing money?  Yes - ? Daughter help Feeding  yourself? No Getting from bed to chair? No Climbing a flight of stairs? No Preparing food and eating?: No Bathing or showering? No Getting dressed: No Getting to/using the toilet? No Moving around from place to place: No In the past year have you fallen or had a near fall?: No   Are you sexually active?  No  Do you have more than one partner?  N/A  Hearing Difficulties: No Do you often ask people to speak up or repeat themselves? No Do you experience ringing or noises in your ears? No Do you have difficulty understanding soft or whispered voices? No Vision:              Any change in vision:   no             Up to date with eye exam: Up to date  Memory:  Do you feel that you have a problem with memory? Yes, short term memory issues - she wonders if she has early alzheimer's.  Has known memory issues.    Do you often misplace items? yes  Do you feel safe at home?  Yes     Advanced Directives have been discussed with the patient? Yes - in place   Medications and allergies reviewed with patient and updated if appropriate.  Patient Active Problem List   Diagnosis Date Noted  . Constipation 12/05/2015  . Macular degeneration 06/15/2015  . Back pain 06/15/2015  . Hyperglycemia 06/15/2015  . Memory difficulties 06/15/2015  . Prediabetes 06/15/2015  . Breast cancer, right breast ([place]) 09/05/2014  . Allergic rhinitis  02/01/2014  . Anemia in neoplastic disease 09/22/2013  . Invasive ductal carcinoma of breast, stage 1 ([place]) 07/18/2011  . CAD (coronary artery disease) 04/26/2011  . Hyperlipidemia 07/03/2009  . BRUIT 07/21/2008  . Anxiety 09/23/2007  . OSTEOARTHRITIS 09/23/2007  . Osteoporosis 09/23/2007  . Essential hypertension 07/25/2006    Current Outpatient Prescriptions on File Prior to Visit  Medication Sig Dispense Refill  . Calcium Carbonate-Vit D-Min (GNP CALCIUM 1200) 1200-1000 MG-UNIT CHEW Chew 1 tablet by mouth 2 (two) times daily.     . cloNIDine (CATAPRES)  0.1 MG tablet Take 1/2 tablet daily if B/P sustains greater than 99991111 systolic 30 tablet 6  . diazepam (VALIUM) 10 MG tablet take 1/2 tablet by mouth every 12 hours if needed for anxiety 45 tablet 0  . diltiazem (CARDIZEM CD) 120 MG 24 hr capsule Take 1 capsule (120 mg total) by mouth daily. 90 capsule 3  . fish oil-omega-3 fatty acids 1000 MG capsule Take 2 g by mouth daily.     Marland Kitchen losartan (COZAAR) 100 MG tablet take 1 tablet by mouth once daily 90 tablet 0  . metoprolol succinate (TOPROL-XL) 25 MG 24 hr tablet Take 1 tablet (25 mg total) by mouth 2 (two) times daily. Yearly physical is due must see MD for future refills 180 tablet 0  . Misc Natural Products (LUTEIN 20 PO) Take 1 capsule by mouth daily.    . mometasone (NASONEX) 50 MCG/ACT nasal spray 2 sprays each nares q day 17 g 0  . multivitamin-lutein (OCUVITE-LUTEIN) CAPS capsule Take 1 capsule by mouth daily.    . polyethylene glycol (MIRALAX / GLYCOLAX) packet Take 17 g by mouth daily as needed for severe constipation. 30 each 3  . prazosin (MINIPRESS) 1 MG capsule take 2 capsules by mouth every morning and 1 capsule every evening and 2 capsules at bedtime 450 capsule 3  . simvastatin (ZOCOR) 20 MG tablet Take 0.5 tablets (10 mg total) by mouth daily. 90 tablet 1  . tamoxifen (NOLVADEX) 20 MG tablet Take 1 tablet (20 mg total) by mouth daily. 90 tablet 3  . Vitamin D, Ergocalciferol, (DRISDOL) 50000 units CAPS capsule take 1 capsule by mouth every week 12 capsule 1   No current facility-administered medications on file prior to visit.     Past Medical History:  Diagnosis Date  . Anemia 12/10/2011  . ANXIETY 09/23/2007  . Arthritis   . Atrial tachycardia ([place])   . Breast cancer ([place])   . HYPERLIPIDEMIA 07/03/2009  . HYPERTENSION 07/25/2006  . MVP (mitral valve prolapse)   . Osteoarthritis   . Osteoporosis   . Prediabetes 06/15/2015    Past Surgical History:  Procedure Laterality Date  . BREAST SURGERY  08/08/11   lumpectomy  .  CARPAL TUNNEL RELEASE  09/2004  . CATARACT EXTRACTION  04/2000, 03/2002    Social History   Social History  . Marital status: Widowed    Spouse name: N/A  . Number of children: N/A  . Years of education: N/A   Social History Main Topics  . Smoking status: Former Smoker    Quit date: 07/08/1994  . Smokeless tobacco: Never Used  . Alcohol use Yes  . Drug use: No  . Sexual activity: Not Currently    Partners: Male   Other Topics Concern  . None   Social History Narrative   Exercise--walking some, more in warmer weather    Family History  Problem Relation Age of Onset  . Heart failure Mother   .  Stroke Father   . Hypertension Other   . Colon cancer Neg Hx     Review of Systems  Constitutional: Negative for appetite change, chills and fever.  HENT: Positive for hearing loss (slight) and tinnitus (sometimes).   Eyes: Negative for visual disturbance.  Respiratory: Negative for cough, shortness of breath and wheezing.   Cardiovascular: Positive for leg swelling (at times). Negative for chest pain and palpitations (none recently).  Gastrointestinal: Positive for constipation (miralax - controlled). Negative for abdominal pain, blood in stool, diarrhea and nausea.       No gerd  Endocrine: Positive for cold intolerance.  Genitourinary: Negative for dysuria and hematuria.  Neurological: Negative for light-headedness and headaches.  Psychiatric/Behavioral: Positive for dysphoric mood (occasionally feels depressed). The patient is not nervous/anxious.        Objective:   Vitals:   08/05/16 1520  BP: 130/88  Pulse: 72  Resp: 16  Temp: 98 F (36.7 C)   Filed Weights   08/05/16 1520  Weight: 106 lb (48.1 kg)   Body mass index is 21.41 kg/m.  Wt Readings from Last 3 Encounters:  08/05/16 106 lb (48.1 kg)  02/10/16 103 lb (46.7 kg)  02/06/16 103 lb 6 oz (46.9 kg)     Physical Exam Constitutional: She appears well-developed and well-nourished. No distress.  HENT:    Head: Normocephalic and atraumatic.  Right Ear: External ear normal. Normal ear canal and TM Left Ear: External ear normal.  Normal ear canal and TM Mouth/Throat: Oropharynx is clear and moist.  Eyes: Conjunctivae and EOM are normal.  Neck: Neck supple. No tracheal deviation present. No thyromegaly present.  No carotid bruit  Cardiovascular: Normal rate, irregular rhythm and normal heart sounds.   1/6 systolic murmur heard.  No edema. Pulmonary/Chest: Effort normal and breath sounds normal. No respiratory distress. She has no wheezes. She has no rales.  Breast: deferred  Abdominal: Soft. She exhibits no distension. There is no tenderness.  Lymphadenopathy: She has no cervical adenopathy.  Skin: Skin is warm and dry. She is not diaphoretic.  Psychiatric: She has a normal mood and affect. Her behavior is normal.         Assessment & Plan:   Wellness Exam: Immunizations  Up to date  Colonoscopy  - no longer needed due to age 68  Up to date  Dexa -  Last done 2011 -  Does not want to have repeat Gyn - no longer seeing gyn Eye exam  Up to date  Hearing loss - some hearing loss - does not want referral to audiology Memory concerns/difficulties - yes, known  Independent of ADLs - needs assistance with finances, driving Stressed the importance of regular exercise   Patient received copy of preventative screening tests/immunizations recommended for the next 5-10 years.   See Problem List for Assessment and Plan of chronic medical problems.

## 2016-08-05 ENCOUNTER — Encounter: Payer: Self-pay | Admitting: Internal Medicine

## 2016-08-05 ENCOUNTER — Ambulatory Visit (INDEPENDENT_AMBULATORY_CARE_PROVIDER_SITE_OTHER): Payer: Medicare Other | Admitting: Internal Medicine

## 2016-08-05 ENCOUNTER — Other Ambulatory Visit (INDEPENDENT_AMBULATORY_CARE_PROVIDER_SITE_OTHER): Payer: Medicare Other

## 2016-08-05 VITALS — BP 130/88 | HR 72 | Temp 98.0°F | Resp 16 | Ht 59.0 in | Wt 106.0 lb

## 2016-08-05 DIAGNOSIS — R7303 Prediabetes: Secondary | ICD-10-CM

## 2016-08-05 DIAGNOSIS — M810 Age-related osteoporosis without current pathological fracture: Secondary | ICD-10-CM | POA: Diagnosis not present

## 2016-08-05 DIAGNOSIS — I2510 Atherosclerotic heart disease of native coronary artery without angina pectoris: Secondary | ICD-10-CM | POA: Diagnosis not present

## 2016-08-05 DIAGNOSIS — I10 Essential (primary) hypertension: Secondary | ICD-10-CM | POA: Diagnosis not present

## 2016-08-05 DIAGNOSIS — R413 Other amnesia: Secondary | ICD-10-CM

## 2016-08-05 DIAGNOSIS — C50919 Malignant neoplasm of unspecified site of unspecified female breast: Secondary | ICD-10-CM

## 2016-08-05 DIAGNOSIS — Z0000 Encounter for general adult medical examination without abnormal findings: Secondary | ICD-10-CM

## 2016-08-05 DIAGNOSIS — F419 Anxiety disorder, unspecified: Secondary | ICD-10-CM

## 2016-08-05 DIAGNOSIS — K5900 Constipation, unspecified: Secondary | ICD-10-CM

## 2016-08-05 DIAGNOSIS — E7800 Pure hypercholesterolemia, unspecified: Secondary | ICD-10-CM

## 2016-08-05 LAB — COMPREHENSIVE METABOLIC PANEL
ALT: 13 U/L (ref 0–35)
AST: 17 U/L (ref 0–37)
Albumin: 3.9 g/dL (ref 3.5–5.2)
Alkaline Phosphatase: 46 U/L (ref 39–117)
BUN: 10 mg/dL (ref 6–23)
CHLORIDE: 96 meq/L (ref 96–112)
CO2: 30 meq/L (ref 19–32)
CREATININE: 0.81 mg/dL (ref 0.40–1.20)
Calcium: 9.5 mg/dL (ref 8.4–10.5)
GFR: 70.9 mL/min (ref >60.00)
GLUCOSE: 92 mg/dL (ref 70–99)
Potassium: 4 mEq/L (ref 3.5–5.1)
SODIUM: 130 meq/L — AB (ref 135–145)
Total Bilirubin: 0.3 mg/dL (ref 0.2–1.2)
Total Protein: 6.9 g/dL (ref 6.0–8.3)

## 2016-08-05 LAB — CBC WITH DIFFERENTIAL/PLATELET
BASOS PCT: 0.9 % (ref 0.0–3.0)
Basophils Absolute: 0.1 10*3/uL (ref 0.0–0.1)
EOS ABS: 0.2 10*3/uL (ref 0.0–0.7)
Eosinophils Relative: 2.3 % (ref 0.0–5.0)
HCT: 31.5 % — ABNORMAL LOW (ref 36.0–46.0)
HEMOGLOBIN: 11 g/dL — AB (ref 12.0–15.0)
Lymphocytes Relative: 26.3 % (ref 12.0–46.0)
Lymphs Abs: 1.9 10*3/uL (ref 0.7–4.0)
MCHC: 35 g/dL (ref 30.0–36.0)
MCV: 93.4 fl (ref 78.0–100.0)
MONO ABS: 1 10*3/uL (ref 0.1–1.0)
Monocytes Relative: 13.5 % — ABNORMAL HIGH (ref 3.0–12.0)
NEUTROS ABS: 4.1 10*3/uL (ref 1.4–7.7)
Neutrophils Relative %: 57 % (ref 43.0–77.0)
PLATELETS: 178 10*3/uL (ref 150.0–400.0)
RBC: 3.37 Mil/uL — ABNORMAL LOW (ref 3.87–5.11)
RDW: 12.5 % (ref 11.5–15.5)
WBC: 7.2 10*3/uL (ref 4.0–10.5)

## 2016-08-05 LAB — LIPID PANEL
CHOL/HDL RATIO: 2
Cholesterol: 134 mg/dL (ref 0–200)
HDL: 68.7 mg/dL (ref >39.00)
LDL CALC: 50 mg/dL (ref 0–99)
NonHDL: 64.89
Triglycerides: 76 mg/dL (ref 0.0–149.0)
VLDL: 15.2 mg/dL (ref 0.0–40.0)

## 2016-08-05 LAB — TSH: TSH: 2.09 u[IU]/mL (ref 0.35–4.50)

## 2016-08-05 LAB — HEMOGLOBIN A1C: HEMOGLOBIN A1C: 5.6 % (ref 4.6–6.5)

## 2016-08-05 NOTE — Patient Instructions (Addendum)
  Gabrielle Warren , Thank you for taking time to come for your Medicare Wellness Visit. I appreciate your ongoing commitment to your health goals. Please review the following plan we discussed and let me know if I can assist you in the future.   These are the goals we discussed: Goals    None      This is a list of the screening recommended for you and due dates:  Health Maintenance  Topic Date Due  . Mammogram  06/27/2017  . Tetanus Vaccine  09/27/2018  . Flu Shot  Addressed  . DEXA scan (bone density measurement)  Completed  . Shingles Vaccine  Completed  . Pneumonia vaccines  Completed    Consider seeing a neurologist for further evaluation of your memory.  Consider taking Aricept for your memory  - it may help decrease the loss of memory in the future.     Test(s) ordered today. Your results will be released to [facility] (or called to you) after review, usually within 72hours after test completion. If any changes need to be made, you will be notified at that same time.  All other Health Maintenance issues reviewed.   All recommended immunizations and age-appropriate screenings are up-to-date or discussed.  No immunizations administered today.   Medications reviewed and updated. No changes recommended at this time.  Your prescription(s) have been submitted to your pharmacy. Please take as directed and contact our office if you believe you are having problem(s) with the medication(s).   Please followup in 6 months

## 2016-08-05 NOTE — Assessment & Plan Note (Signed)
She is not interested in seeing neurology at this time Discussed aricept She is independent with most ADLs - needs help with finances, driving, lives with daughter

## 2016-08-05 NOTE — Assessment & Plan Note (Addendum)
BP well controlled Current regimen effective and well tolerated Continue current medications at current doses Cmp, tsh 

## 2016-08-05 NOTE — Assessment & Plan Note (Signed)
No angina, SOB Continue current medications

## 2016-08-05 NOTE — Progress Notes (Signed)
Pre visit review using our clinic review tool, if applicable. No additional management support is needed unless otherwise documented below in the visit note. 

## 2016-08-05 NOTE — Assessment & Plan Note (Signed)
On tamoxifen  Following with Dr Jana Hakim No evidence of recurrence - mammogram up to date

## 2016-08-05 NOTE — Assessment & Plan Note (Signed)
Taking valium twice daily - has been on it for years No side effects, tolerating it well Will continue at current dose

## 2016-08-05 NOTE — Assessment & Plan Note (Signed)
Controlled with miralax daily

## 2016-08-05 NOTE — Assessment & Plan Note (Signed)
Check lipid panel  Continue daily statin Regular exercise and healthy diet encouraged  

## 2016-08-05 NOTE — Assessment & Plan Note (Signed)
Check a1c Low sugar / carb diet Stressed regular exercise   

## 2016-08-05 NOTE — Assessment & Plan Note (Signed)
Taking calcium and vitamin d Does some walking Deferred dexa - does not feel she needs it/ does not want to take medication for her bones

## 2016-08-06 ENCOUNTER — Encounter: Payer: Self-pay | Admitting: Emergency Medicine

## 2016-08-07 ENCOUNTER — Telehealth: Payer: Self-pay | Admitting: Gastroenterology

## 2016-08-08 NOTE — Telephone Encounter (Signed)
Called patient back this morning, she states that she did try some OTC laxatives along with an herbal tea called EasyMove and had good results today. She is feeling much better and will call back as needed.

## 2016-08-13 ENCOUNTER — Other Ambulatory Visit: Payer: Self-pay | Admitting: Internal Medicine

## 2016-08-13 NOTE — Telephone Encounter (Signed)
RX faxed to [facility] 

## 2016-08-22 ENCOUNTER — Ambulatory Visit: Payer: Medicare Other | Admitting: Nurse Practitioner

## 2016-08-30 ENCOUNTER — Other Ambulatory Visit: Payer: Self-pay | Admitting: Adult Health

## 2016-08-30 DIAGNOSIS — C50919 Malignant neoplasm of unspecified site of unspecified female breast: Secondary | ICD-10-CM

## 2016-09-02 ENCOUNTER — Encounter: Payer: Self-pay | Admitting: Oncology

## 2016-09-02 ENCOUNTER — Ambulatory Visit (HOSPITAL_BASED_OUTPATIENT_CLINIC_OR_DEPARTMENT_OTHER): Payer: Medicare Other | Admitting: Oncology

## 2016-09-02 ENCOUNTER — Other Ambulatory Visit (HOSPITAL_BASED_OUTPATIENT_CLINIC_OR_DEPARTMENT_OTHER): Payer: Medicare Other

## 2016-09-02 DIAGNOSIS — M8580 Other specified disorders of bone density and structure, unspecified site: Secondary | ICD-10-CM

## 2016-09-02 DIAGNOSIS — C50911 Malignant neoplasm of unspecified site of right female breast: Secondary | ICD-10-CM

## 2016-09-02 DIAGNOSIS — C50919 Malignant neoplasm of unspecified site of unspecified female breast: Secondary | ICD-10-CM

## 2016-09-02 DIAGNOSIS — D649 Anemia, unspecified: Secondary | ICD-10-CM | POA: Diagnosis not present

## 2016-09-02 DIAGNOSIS — Z170 Estrogen receptor positive status [ER+]: Secondary | ICD-10-CM

## 2016-09-02 DIAGNOSIS — C50111 Malignant neoplasm of central portion of right female breast: Secondary | ICD-10-CM | POA: Insufficient documentation

## 2016-09-02 LAB — CBC WITH DIFFERENTIAL/PLATELET
BASO%: 0.8 % (ref 0.0–2.0)
Basophils Absolute: 0.1 10*3/uL (ref 0.0–0.1)
EOS%: 1.7 % (ref 0.0–7.0)
Eosinophils Absolute: 0.1 10*3/uL (ref 0.0–0.5)
HEMATOCRIT: 31.1 % — AB (ref 34.8–46.6)
HGB: 10.5 g/dL — ABNORMAL LOW (ref 11.6–15.9)
LYMPH#: 1.3 10*3/uL (ref 0.9–3.3)
LYMPH%: 16.1 % (ref 14.0–49.7)
MCH: 31.8 pg (ref 25.1–34.0)
MCHC: 33.9 g/dL (ref 31.5–36.0)
MCV: 93.8 fL (ref 79.5–101.0)
MONO#: 1.1 10*3/uL — ABNORMAL HIGH (ref 0.1–0.9)
MONO%: 12.8 % (ref 0.0–14.0)
NEUT#: 5.8 10*3/uL (ref 1.5–6.5)
NEUT%: 68.6 % (ref 38.4–76.8)
Platelets: 193 10*3/uL (ref 145–400)
RBC: 3.31 10*6/uL — AB (ref 3.70–5.45)
RDW: 12.7 % (ref 11.2–14.5)
WBC: 8.4 10*3/uL (ref 3.9–10.3)

## 2016-09-02 LAB — COMPREHENSIVE METABOLIC PANEL
ALT: 12 U/L (ref 0–55)
AST: 16 U/L (ref 5–34)
Albumin: 3.4 g/dL — ABNORMAL LOW (ref 3.5–5.0)
Alkaline Phosphatase: 44 U/L (ref 40–150)
Anion Gap: 6 mEq/L (ref 3–11)
BUN: 8.7 mg/dL (ref 7.0–26.0)
CHLORIDE: 99 meq/L (ref 98–109)
CO2: 27 meq/L (ref 22–29)
CREATININE: 0.9 mg/dL (ref 0.6–1.1)
Calcium: 9 mg/dL (ref 8.4–10.4)
EGFR: 58 mL/min/{1.73_m2} — ABNORMAL LOW (ref >90)
GLUCOSE: 127 mg/dL (ref 70–140)
POTASSIUM: 4.4 meq/L (ref 3.5–5.1)
SODIUM: 131 meq/L — AB (ref 136–145)
Total Bilirubin: 0.32 mg/dL (ref 0.20–1.20)
Total Protein: 6.4 g/dL (ref 6.4–8.3)

## 2016-09-02 NOTE — Progress Notes (Signed)
Gabrielle Warren  DOB: 1926/04/12  MR#: [id number]  CSN#: [id number]    PCP: Binnie Rail, MD GYN: Cheri Fowler, MD SU: Coralie Keens, MD OTHER MD: Minus Breeding, MD  CHIEF COMPLAINT:   Estrogen receptor positive Right Breast Cancer   CURRENT TREATMENT: Tamoxifen                 History of present illness:    from the original intake note:   "Alura "Gabrielle Warren" Soltau is an 83 year old [place] woman who on screening mammography at [facility] was found to have a suspicious mass. I do not have those records, but additional views confirmed the abnormality andb iopsy was attempted 65/53/7482, complicated by bleeding. The patient was then referred to the breast Center whereultrasound-guided biopsy was performed 07/05/2011. This showed an invasive ductal carcinoma, grade 1, HER-2 negative, 100% estrogen receptor positive, 86% progesterone receptor positive, with an MIB-1 of 8%.the patient was then referred for bilateral breast MRIs which showed a 9 mm spiculated enhancing mass in the middle third of the right breast. There was an associated clip artifact. In the contralateral breast, a 5 mm mass was found which was irregular and nodular, with washout genetics. Biopsy of the second mass under MRI guidance (it could not be visualized under ultrasound) is scheduled for January 10. "  Her subsequent history is as detailed below  Interval history:  Gabrielle Warren returns today for follow-up of her estrogen receptor positive breast cancer. She continues on tamoxifen, with good tolerance. She obtains it at a good price.  Review of Systems: Gabrielle Warren developed significant pain in her right hip about a year ago. She was evaluated by Dr. Berenice Primas who told her she had bursitis and a "cracked pelvis". She was given physical therapy and told to walk for exercise. She is trying to walk and says she can go a little short of a mile. She takes Motrin occasionally for pain but most of the time she does not take any  pain medicine. In addition K tells me she no longer drives because her eyes are "an issue". Aside from these problems of detailed review of systems today was stable  Past medical history:      Past Medical History:  Diagnosis Date  . Anemia 12/10/2011  . ANXIETY 09/23/2007  . Arthritis   . Atrial tachycardia ([place])   . Breast cancer ([place])   . HYPERLIPIDEMIA 07/03/2009  . HYPERTENSION 07/25/2006  . MVP (mitral valve prolapse)   . Osteoarthritis   . Osteoporosis   . Prediabetes 06/15/2015  coronary artery disease, status post remote heart attack (7078)  Past surgical history:      Past Surgical History:  Procedure Laterality Date  . BREAST SURGERY  08/08/11   lumpectomy  . CARPAL TUNNEL RELEASE  09/2004  . CATARACT EXTRACTION  04/2000, 03/2002    Family history:     Family History  Problem Relation Age of Onset  . Heart failure Mother   . Stroke Father   . Hypertension Other   . Colon cancer Neg Hx   the patient's father died suddenly at the age of 51, of unknown causes. The patient's mother died at the age of 61 from complications following a leg amputation. The patient had 2 brothers and 2 sisters. There is no history of breast or ovarian cancer in the immediate family  Gynecologic history:    The patient had menarche age 89, last menstrual period 49. She took Prempro for many years, stopping only in December  of 2012 with this diagnosis. The patient is GX P3, first pregnancy to term age 41.    Social history:  (Updated 09/22/2013) The patient's maiden name was [place]. Her family originally came from [place]. 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 Her 3 children are Daughter Juliann Pulse age 48 works in [place] as a Licensed conveyancer. Son Milta Deiters 53 lives in [place] and is a retired Scientist, research (medical). Son Nicolette Bang") 54, lives in [place] and works in Nutritional therapist. The patient has no grandchildren. She is alone at home except for a cat,  but tells me that her daughter Juliann Pulse lives nearby.  As of March 2015, she is trying to sell her home, and plans to move in with [place].    ADVANCED DIRECTIVES: yes  Health maintenance:      (Updated 09/22/2013) Social History  Substance Use Topics  . Smoking status: Former Smoker    Quit date: 07/08/1994  . Smokeless tobacco: Never Used  . Alcohol use Yes      Colonoscopy: never  PAP: UTD/[facility]  Bone density: Dec 2011, severe osteopenia with T score of -2.4 ([facility])  Cholesterol: Dec 2014/[facility]  Allergies:     Allergies  Allergen Reactions  . Fosamax [Alendronate Sodium] Other (See Comments)    Canker sores and bleeding gums    Medications:      Current Outpatient Prescriptions  Medication Sig Dispense Refill  . Calcium Carbonate-Vit D-Min (GNP CALCIUM 1200) 1200-1000 MG-UNIT CHEW Chew 1 tablet by mouth 2 (two) times daily.     . cloNIDine (CATAPRES) 0.1 MG tablet Take 1/2 tablet daily if B/P sustains greater than 798 systolic 30 tablet 6  . diazepam (VALIUM) 10 MG tablet take 1/2 tablets by mouth every 12 hours if needed for anxiety 45 tablet 0  . diltiazem (CARDIZEM CD) 120 MG 24 hr capsule Take 1 capsule (120 mg total) by mouth daily. 90 capsule 3  . fish oil-omega-3 fatty acids 1000 MG capsule Take 2 g by mouth daily.     Marland Kitchen losartan (COZAAR) 100 MG tablet take 1 tablet by mouth once daily 90 tablet 0  . metoprolol succinate (TOPROL-XL) 25 MG 24 hr tablet Take 1 tablet (25 mg total) by mouth 2 (two) times daily. Yearly physical is due must see MD for future refills 180 tablet 0  . Misc Natural Products (LUTEIN 20 PO) Take 1 capsule by mouth daily.    . mometasone (NASONEX) 50 MCG/ACT nasal spray 2 sprays each nares q day 17 g 0  . multivitamin-lutein (OCUVITE-LUTEIN) CAPS capsule Take 1 capsule by mouth daily.    . polyethylene glycol (MIRALAX / GLYCOLAX) packet Take 17 g by mouth daily as needed for severe constipation. 30 each 3  . prazosin (MINIPRESS) 1 MG capsule take 2  capsules by mouth every morning and 1 capsule every evening and 2 capsules at bedtime 450 capsule 3  . simvastatin (ZOCOR) 20 MG tablet Take 0.5 tablets (10 mg total) by mouth daily. 90 tablet 1  . tamoxifen (NOLVADEX) 20 MG tablet Take 1 tablet (20 mg total) by mouth daily. 90 tablet 3  . Vitamin D, Ergocalciferol, (DRISDOL) 50000 units CAPS capsule take 1 capsule by mouth every week 12 capsule 1   No current facility-administered medications for this visit.     Physical exam:  Elderly white woman In no acute distress  Vitals:   09/02/16 1430  BP: (!) 150/53  Pulse: 66  Resp: 17  Temp:  8 F (36.7 C)     Body mass index is 21.41 kg/m.  ECOG PS: 1 Filed Weights   09/02/16 1430  Weight: 106 lb (48.1 kg)   Sclerae unicteric, EOMs intact Oropharynx clear and moist-- no thrush or other lesions No cervical or supraclavicular adenopathy Lungs no rales or rhonchi Heart regular rate and rhythm, no murmur appreciated Abd soft, nontender, positive bowel sounds MSK no focal spinal tenderness, no upper extremity lymphedema Neuro: nonfocal, well oriented, appropriate affect Breasts: The right breast is status post lumpectomy. The cosmetic result is excellent and the incision is hard to find this as it is periareolar). There are no suspicious masses or any evidence of recurrence. The right axilla is benign. The left breast is unremarkable.   Lab results:        Component Value Date/Time   WBC 8.4 09/02/2016 1417   WBC 7.2 08/05/2016 1625   RBC 3.31 (L) 09/02/2016 1417   RBC 3.37 (L) 08/05/2016 1625   HGB 10.5 (L) 09/02/2016 1417   HCT 31.1 (L) 09/02/2016 1417   PLT 193 09/02/2016 1417   MCV 93.8 09/02/2016 1417   MCH 31.8 09/02/2016 1417   MCH 31.6 02/10/2016 1144   MCHC 33.9 09/02/2016 1417   MCHC 35.0 08/05/2016 1625   RDW 12.7 09/02/2016 1417   LYMPHSABS 1.3 09/02/2016 1417   MONOABS 1.1 (H) 09/02/2016 1417   EOSABS 0.1 09/02/2016 1417   BASOSABS 0.1 09/02/2016 1417         Component Value Date/Time   NA 130 (L) 08/05/2016 1625   NA 134 (L) 01/11/2013 1006   K 4.0 08/05/2016 1625   K 4.2 01/11/2013 1006   CL 96 08/05/2016 1625   CO2 30 08/05/2016 1625   CO2 26 01/11/2013 1006   GLUCOSE 92 08/05/2016 1625   GLUCOSE 130 01/11/2013 1006   GLUCOSE 124 (H) 06/18/2006 0958   BUN 10 08/05/2016 1625   BUN 9.3 01/11/2013 1006   CREATININE 0.81 08/05/2016 1625   CREATININE 1.23 (H) 06/29/2015 1533   CREATININE 0.8 01/11/2013 1006   CALCIUM 9.5 08/05/2016 1625   CALCIUM 9.0 01/11/2013 1006   PROT 6.9 08/05/2016 1625   PROT 6.5 01/11/2013 1006   ALBUMIN 3.9 08/05/2016 1625   ALBUMIN 3.2 (L) 01/11/2013 1006   AST 17 08/05/2016 1625   AST 19 01/11/2013 1006   ALT 13 08/05/2016 1625   ALT 19 01/11/2013 1006   ALKPHOS 46 08/05/2016 1625   ALKPHOS 39 (L) 01/11/2013 1006   BILITOT 0.3 08/05/2016 1625   BILITOT 0.32 01/11/2013 1006   GFRNONAA 58 (L) 02/10/2016 1144   GFRNONAA 65 12/14/2013 0829   GFRAA >60 02/10/2016 1144   GFRAA 75 12/14/2013 0829       Studies:     CLINICAL DATA: Right lumpectomy. Annual mammography.  EXAM: 2D DIGITAL DIAGNOSTIC BILATERAL MAMMOGRAM WITH CAD AND ADJUNCT TOMO  COMPARISON: Previous exam(s).  ACR Breast Density Category b: There are scattered areas of fibroglandular density.  FINDINGS: No change in the right lumpectomy site. No suspicious masses, calcifications, or distortion.  Mammographic images were processed with CAD.  IMPRESSION: No suspicious abnormalities.  RECOMMENDATION: Annual diagnostic mammography.  I have discussed the findings and recommendations with the patient. Results were also provided in writing at the conclusion of the visit. If applicable, a reminder letter will be sent to the patient regarding the next appointment.  BI-RADS CATEGORY 2: Benign.   Electronically Signed By: Dorise Bullion III M.D On: 06/27/2016 16:12  Assessment: 83 y.o.  [place] woman    (1)  status post right lumpectomy 08/08/2011 for a T1c NX invasive ductal carcinoma, grade 1, strongly estrogen and progesterone receptor positive, HER-2 negative, with an MIB-1 of less than 10.  (2)  no adjuvant radiation or chemotherapy.  (3)  started on tamoxifen February 2013, completing 5 years February 2018  (4)  Mild anemia, chronic and stable  Plan:  Gabrielle Warren is now a little over 5 years out from definitive surgery for her breast cancer with no evidence of disease recurrence. This is very favorable.  She is completing 5 years of tamoxifen this month in patients who are at high risk of recurrence we frequently suggest continuing tamoxifen and additional 5 years. In her case however I do not think that that would make a significant or possibly any difference to her prognosis or survival and accordingly as she is going off tamoxifen at this point.  I am comfortable releasing her to Dr. Quay Burow this care. All Gabrielle Warren will need is a yearly mammogram and a yearly physician breast exam as far as follow-up for her breast cancer is concerned.  I will be glad to see her again at any point in the future but as of this point we are not making any further routine appointment for her here. Chauncey Cruel, MD 09/02/2016

## 2016-09-05 ENCOUNTER — Telehealth: Payer: Self-pay | Admitting: Internal Medicine

## 2016-09-05 NOTE — Telephone Encounter (Signed)
Please call her when you get a chance.

## 2016-09-06 NOTE — Telephone Encounter (Signed)
Spoke with Gabrielle Warren regarding a questions she had about CPE's and Medicare. We recently had this conversation but she wanted to be sure she understood the difference between AWV and CPE's with Medicare. Advised her to call me anytime with further questions.

## 2016-09-11 ENCOUNTER — Other Ambulatory Visit: Payer: Self-pay | Admitting: Internal Medicine

## 2016-09-16 ENCOUNTER — Ambulatory Visit: Payer: Medicare Other | Admitting: Gastroenterology

## 2016-09-26 ENCOUNTER — Other Ambulatory Visit: Payer: Self-pay | Admitting: Internal Medicine

## 2016-09-26 NOTE — Telephone Encounter (Signed)
Left msg on triage status on refill...Johny Chess

## 2016-09-26 NOTE — Telephone Encounter (Signed)
RX has been called into pharmacy.  

## 2016-09-26 NOTE — Telephone Encounter (Signed)
RX printed for MD signature. Will fax when she returns on 09/27/16

## 2016-09-27 ENCOUNTER — Other Ambulatory Visit: Payer: Self-pay | Admitting: Emergency Medicine

## 2016-09-27 ENCOUNTER — Other Ambulatory Visit: Payer: Self-pay | Admitting: Internal Medicine

## 2016-09-27 MED ORDER — PRAZOSIN HCL 1 MG PO CAPS: ORAL_CAPSULE | ORAL | 2 refills | Status: DC

## 2016-10-02 ENCOUNTER — Telehealth: Payer: Self-pay | Admitting: *Deleted

## 2016-10-02 MED ORDER — PRAZOSIN HCL 1 MG PO CAPS: ORAL_CAPSULE | ORAL | 2 refills | Status: DC

## 2016-10-02 NOTE — Telephone Encounter (Signed)
Rec'd call pt states she need a refill on her Prazosin. Inform pt per chart med was approve and sent to [facility]. Pt states she only use [facility] for her metoprolol all other [facility] on [address]. Notified pt will res[facility] to [facility] on [address].....Johny Chess

## 2016-10-03 NOTE — Telephone Encounter (Addendum)
Pt called and stated that [facility] did not have the medication.   [facility] and they have it on hold.  [facility] will call pt when rx is ready for pick up.

## 2016-10-20 ENCOUNTER — Other Ambulatory Visit: Payer: Self-pay | Admitting: Oncology

## 2016-10-20 DIAGNOSIS — C50011 Malignant neoplasm of nipple and areola, right female breast: Secondary | ICD-10-CM

## 2016-10-25 ENCOUNTER — Ambulatory Visit: Payer: Medicare Other | Admitting: Internal Medicine

## 2016-11-01 ENCOUNTER — Other Ambulatory Visit: Payer: Self-pay | Admitting: Internal Medicine

## 2016-11-02 ENCOUNTER — Other Ambulatory Visit: Payer: Self-pay | Admitting: Internal Medicine

## 2016-11-04 ENCOUNTER — Other Ambulatory Visit: Payer: Self-pay | Admitting: Internal Medicine

## 2016-11-04 NOTE — Telephone Encounter (Signed)
RX faxed to [facility] 

## 2016-12-12 ENCOUNTER — Telehealth: Payer: Self-pay | Admitting: *Deleted

## 2016-12-12 MED ORDER — METOPROLOL SUCCINATE ER 25 MG PO TB24: 25.0000 mg | ORAL_TABLET | Freq: Two times a day (BID) | ORAL | 2 refills | Status: DC

## 2016-12-12 NOTE — Telephone Encounter (Signed)
Rec'd call pt requesting refill on her metoprolol. Sent rx to [facility]...Johny Chess

## 2016-12-22 ENCOUNTER — Other Ambulatory Visit: Payer: Self-pay | Admitting: Internal Medicine

## 2016-12-23 NOTE — Telephone Encounter (Signed)
RX faxed to [facility] 

## 2016-12-26 ENCOUNTER — Encounter: Payer: Self-pay | Admitting: Cardiology

## 2017-01-01 ENCOUNTER — Telehealth: Payer: Self-pay | Admitting: Gastroenterology

## 2017-01-01 ENCOUNTER — Telehealth: Payer: Self-pay | Admitting: *Deleted

## 2017-01-01 DIAGNOSIS — K5900 Constipation, unspecified: Secondary | ICD-10-CM

## 2017-01-01 NOTE — Telephone Encounter (Signed)
Have her make sure she continues the miralax daily.  Have her make sure she is also taking a stool softener - she can take one-three colace a day.   A GI referral was ordered

## 2017-01-01 NOTE — Telephone Encounter (Signed)
Notified pt w/MD response.../lmb 

## 2017-01-01 NOTE — Telephone Encounter (Signed)
Ok , please send prescription for Linzess 7mcg daily if she continues to have significant constipation. Try to bring her in sooner if we have any cancellation. Thanks

## 2017-01-01 NOTE — Telephone Encounter (Signed)
Pt left msg on triage stating she is having a really bad time w/her bowel movement. She been taking the miralax doing all that she can do. Thinks she is impacted. Pt is wanting to see someone in GI for ongoing constipation. Requesting referral, and any recommendation on what else she can do...Johny Chess

## 2017-01-01 NOTE — Telephone Encounter (Signed)
Patient calls to discuss ongoing issue of constipation. She does state this is a long term issue. She is having bowel movements daily but does not feel she has emptied completely. She is passing formed stool, but does not call it a hard stool. Today she took 2 tbsp MOM, drank 8 oz of water and 8 oz of prune juice. On a daily basis she takes a fiber supplement. She has Miralax but has not used it in a while because "it doesn't work." She denies nausea, vomiting, abdominal pain or fever.  Discussed how MOM and other stimulant laxatives works. Discussed how stool softeners work. Also discussed daily Miralax up to 4 glasses and adjusting as she needs. Appointment scheduled in August.  Patient also called her PCP about this today.

## 2017-01-02 ENCOUNTER — Other Ambulatory Visit: Payer: Self-pay

## 2017-01-02 MED ORDER — LINACLOTIDE 72 MCG PO CAPS: 72.0000 ug | ORAL_CAPSULE | Freq: Every day | ORAL | 3 refills | Status: DC

## 2017-01-02 NOTE — Progress Notes (Unsigned)
New Rx sent to the pharmacy

## 2017-01-02 NOTE — Telephone Encounter (Signed)
Prescription sent to the listed pharmacy. May require prior authorization.

## 2017-01-02 NOTE — Telephone Encounter (Signed)
Linzess is too expensive at >$400 / month. Do you want me to see if she has better coverage on Amitiza?

## 2017-01-03 ENCOUNTER — Other Ambulatory Visit: Payer: Self-pay

## 2017-01-03 MED ORDER — LUBIPROSTONE 8 MCG PO CAPS: 8.0000 ug | ORAL_CAPSULE | Freq: Two times a day (BID) | ORAL | 3 refills | Status: DC

## 2017-01-03 NOTE — Telephone Encounter (Signed)
Patient notified. She has decided that she will continue with Miralax adjusting as needed. Prescriptions cancelled.

## 2017-01-03 NOTE — Telephone Encounter (Signed)
Ok to try Amitiza 4mcg BID instead. Thanks

## 2017-01-06 ENCOUNTER — Telehealth: Payer: Self-pay | Admitting: Gastroenterology

## 2017-01-06 NOTE — Telephone Encounter (Signed)
Patient is actively doing a Miralax purge. She reports her bowels are moving a little. She passes brown liquid.

## 2017-01-07 ENCOUNTER — Emergency Department (HOSPITAL_COMMUNITY): Payer: Medicare Other

## 2017-01-07 ENCOUNTER — Encounter (HOSPITAL_COMMUNITY): Payer: Self-pay

## 2017-01-07 ENCOUNTER — Emergency Department (HOSPITAL_COMMUNITY)
Admission: EM | Admit: 2017-01-07 | Discharge: 2017-01-07 | Disposition: A | Discharge status: Home / Self Care | Payer: Medicare Other | Attending: Emergency Medicine | Admitting: Emergency Medicine

## 2017-01-07 ENCOUNTER — Telehealth: Payer: Self-pay | Admitting: Gastroenterology

## 2017-01-07 DIAGNOSIS — K5900 Constipation, unspecified: Secondary | ICD-10-CM | POA: Diagnosis not present

## 2017-01-07 DIAGNOSIS — R109 Unspecified abdominal pain: Secondary | ICD-10-CM | POA: Diagnosis not present

## 2017-01-07 DIAGNOSIS — Z87891 Personal history of nicotine dependence: Secondary | ICD-10-CM | POA: Diagnosis not present

## 2017-01-07 DIAGNOSIS — E871 Hypo-osmolality and hyponatremia: Secondary | ICD-10-CM | POA: Insufficient documentation

## 2017-01-07 DIAGNOSIS — I10 Essential (primary) hypertension: Secondary | ICD-10-CM | POA: Diagnosis not present

## 2017-01-07 DIAGNOSIS — Z7983 Long term (current) use of bisphosphonates: Secondary | ICD-10-CM | POA: Diagnosis not present

## 2017-01-07 DIAGNOSIS — I2510 Atherosclerotic heart disease of native coronary artery without angina pectoris: Secondary | ICD-10-CM | POA: Diagnosis not present

## 2017-01-07 DIAGNOSIS — Z7982 Long term (current) use of aspirin: Secondary | ICD-10-CM | POA: Diagnosis not present

## 2017-01-07 DIAGNOSIS — C50111 Malignant neoplasm of central portion of right female breast: Secondary | ICD-10-CM | POA: Diagnosis not present

## 2017-01-07 LAB — URINALYSIS, ROUTINE W REFLEX MICROSCOPIC
BILIRUBIN URINE: NEGATIVE
Glucose, UA: NEGATIVE mg/dL
HGB URINE DIPSTICK: NEGATIVE
KETONES UR: NEGATIVE mg/dL
NITRITE: NEGATIVE
PROTEIN: NEGATIVE mg/dL
RBC / HPF: NONE SEEN RBC/hpf (ref 0–5)
SPECIFIC GRAVITY, URINE: 1.001 — AB (ref 1.005–1.030)
pH: 7 (ref 5.0–8.0)

## 2017-01-07 LAB — CBC WITH DIFFERENTIAL/PLATELET
Basophils Absolute: 0 10*3/uL (ref 0.0–0.1)
Basophils Relative: 0 %
EOS PCT: 4 %
Eosinophils Absolute: 0.3 10*3/uL (ref 0.0–0.7)
HEMATOCRIT: 29 % — AB (ref 36.0–46.0)
Hemoglobin: 10.1 g/dL — ABNORMAL LOW (ref 12.0–15.0)
LYMPHS ABS: 1.7 10*3/uL (ref 0.7–4.0)
LYMPHS PCT: 24 %
MCH: 30.8 pg (ref 26.0–34.0)
MCHC: 34.8 g/dL (ref 30.0–36.0)
MCV: 88.4 fL (ref 78.0–100.0)
MONO ABS: 1.1 10*3/uL — AB (ref 0.1–1.0)
Monocytes Relative: 15 %
Neutro Abs: 4.1 10*3/uL (ref 1.7–7.7)
Neutrophils Relative %: 57 %
PLATELETS: 203 10*3/uL (ref 150–400)
RBC: 3.28 MIL/uL — ABNORMAL LOW (ref 3.87–5.11)
RDW: 11.7 % (ref 11.5–15.5)
WBC: 7.3 10*3/uL (ref 4.0–10.5)

## 2017-01-07 LAB — COMPREHENSIVE METABOLIC PANEL
ALK PHOS: 47 U/L (ref 38–126)
ALT: 17 U/L (ref 14–54)
AST: 23 U/L (ref 15–41)
Albumin: 3.9 g/dL (ref 3.5–5.0)
Anion gap: 8 (ref 5–15)
BUN: 5 mg/dL — AB (ref 6–20)
CALCIUM: 8.9 mg/dL (ref 8.9–10.3)
CO2: 25 mmol/L (ref 22–32)
CREATININE: 0.75 mg/dL (ref 0.44–1.00)
Chloride: 94 mmol/L — ABNORMAL LOW (ref 101–111)
GFR calc Af Amer: >60 mL/min (ref >60)
Glucose, Bld: 107 mg/dL — ABNORMAL HIGH (ref 65–99)
Potassium: 3.5 mmol/L (ref 3.5–5.1)
Sodium: 127 mmol/L — ABNORMAL LOW (ref 135–145)
Total Bilirubin: 0.5 mg/dL (ref 0.3–1.2)
Total Protein: 6.7 g/dL (ref 6.5–8.1)

## 2017-01-07 LAB — LIPASE, BLOOD: LIPASE: 26 U/L (ref 11–51)

## 2017-01-07 MED ORDER — IOPAMIDOL (ISOVUE-300) INJECTION 61%
INTRAVENOUS | Status: AC
  Filled 2017-01-07: qty 100

## 2017-01-07 MED ORDER — IOPAMIDOL (ISOVUE-300) INJECTION 61%
100.0000 mL | Freq: Once | INTRAVENOUS | Status: AC | PRN
  Administered 2017-01-07: 70 mL via INTRAVENOUS

## 2017-01-07 MED ORDER — SODIUM CHLORIDE 0.9 % IV BOLUS (SEPSIS)
500.0000 mL | Freq: Once | INTRAVENOUS | Status: AC
Start: 2017-01-07 — End: 2017-01-07
  Administered 2017-01-07: 500 mL via INTRAVENOUS

## 2017-01-07 NOTE — ED Notes (Signed)
Patient taking home BP meds, ok per provider.

## 2017-01-07 NOTE — ED Notes (Signed)
Bed: WA06 Expected date:  Expected time:  Means of arrival:  Comments: 

## 2017-01-07 NOTE — Telephone Encounter (Signed)
Its unlikely patient has persistent high stool burden after bowel purge. We can obtain Abdominal X-ray to confirm if she is continuing to have significant abdominal discomfort

## 2017-01-07 NOTE — Discharge Instructions (Signed)
Read the information below.  You may return to the Emergency Department at any time for worsening condition or any new symptoms that concern you.   If you develop high fevers, worsening abdominal pain, uncontrolled vomiting, or are unable to tolerate fluids by mouth, return to the ER for a recheck.    Your CT scan does not show any obstruction or any other concerning abnormality.  You do not have a large amount of stool in your colon.   Your labs show that your sodium is low.  Please eat and drink well and follow up with your primary care provider to recheck this.  Please speak with the gastroenterologist tomorrow for follow up.

## 2017-01-07 NOTE — ED Provider Notes (Signed)
[facility] DEPT Provider Note   CSN: [id number] Arrival date & time: 01/07/17  1858     History   Chief Complaint Chief Complaint  Patient presents with  . Abdominal Pain    HPI Gabrielle Warren is a 83 y.o. female.  HPI   Patient with hx HTN, HLD, breast CA presents with abdominal pain, constipation x 2-3 weeks.  She has had some dark liquid stool and some flecks of stool only.  Reports diffuse abdominal pain and nausea.  Feels she is "blocked up."  Has been in contact with PCP who have recommended miralax and various treatments without improvement.  Pt has a pending referral to GI.  Pt denies fevers, chills, myalgias, vomiting, urinary symptoms, hematochezia, or melena.  No medication changes except recently stopped tamoxifen.  No hx abdominal surgeries.    Past Medical History:  Diagnosis Date  . Anemia 12/10/2011  . ANXIETY 09/23/2007  . Arthritis   . Atrial tachycardia ([place])   . Breast cancer ([place])   . HYPERLIPIDEMIA 07/03/2009  . HYPERTENSION 07/25/2006  . MVP (mitral valve prolapse)   . Osteoarthritis   . Osteoporosis   . Prediabetes 06/15/2015    Patient Active Problem List   Diagnosis Date Noted  . Malignant neoplasm of central portion of right breast in female, estrogen receptor positive ([place]) 09/02/2016  . Constipation 12/05/2015  . Macular degeneration 06/15/2015  . Back pain 06/15/2015  . Memory difficulties 06/15/2015  . Prediabetes 06/15/2015  . Breast cancer, right breast ([place]) 09/05/2014  . Allergic rhinitis 02/01/2014  . Anemia in neoplastic disease 09/22/2013  . Invasive ductal carcinoma of breast, stage 1 ([place]) 07/18/2011  . CAD (coronary artery disease) 04/26/2011  . Hyperlipidemia 07/03/2009  . BRUIT 07/21/2008  . Anxiety 09/23/2007  . OSTEOARTHRITIS 09/23/2007  . Osteoporosis 09/23/2007  . Essential hypertension 07/25/2006    Past Surgical History:  Procedure Laterality Date  . BREAST SURGERY  08/08/11   lumpectomy  . CARPAL  TUNNEL RELEASE  09/2004  . CATARACT EXTRACTION  04/2000, 03/2002    OB History    No data available       Home Medications    Prior to Admission medications   Medication Sig Start Date End Date Taking? Authorizing Provider  aspirin EC 81 MG tablet Take 81 mg by mouth at bedtime.   Yes [provider]  Calcium Carb-Cholecalciferol [phone] MG-UNIT CAPS Take 1 capsule by mouth 2 (two) times daily.   Yes [provider]  cloNIDine (CATAPRES) 0.1 MG tablet Take 1/2 tablet daily if B/P sustains greater than 742 systolic Patient taking differently: Take 0.05 mg by mouth daily as needed (if BP sustains greater than 595 systolic).  05/17/14  Yes Minus Breeding, MD  diazepam (VALIUM) 10 MG tablet take 1/2 tablet by mouth every 12 hours if needed for anxiety 12/23/16  Yes Burns, Claudina Lick, MD  diltiazem (CARDIZEM CD) 120 MG 24 hr capsule Take 1 capsule (120 mg total) by mouth daily. 01/04/16  Yes Burns, Claudina Lick, MD  losartan (COZAAR) 100 MG tablet take 1 tablet by mouth once daily 11/04/16  Yes Burns, Claudina Lick, MD  metoprolol succinate (TOPROL-XL) 25 MG 24 hr tablet Take 1 tablet (25 mg total) by mouth 2 (two) times daily. 12/12/16  Yes Burns, Claudina Lick, MD  mometasone (NASONEX) 50 MCG/ACT nasal spray 2 sprays each nares q day Patient taking differently: Place 2 sprays into the nose daily.  02/01/14  Yes Saguier, Percell Miller, PA-C  Multiple Vitamin (  MULTIVITAMIN WITH MINERALS) TABS tablet Take 1 tablet by mouth daily.   Yes [provider]  multivitamin-lutein (OCUVITE-LUTEIN) CAPS capsule Take 1 capsule by mouth daily.   Yes [provider]  omega-3 acid ethyl esters (LOVAZA) 1 g capsule Take 1 g by mouth 2 (two) times daily.   Yes [provider]  polyethylene glycol (MIRALAX / GLYCOLAX) packet Take 17 g by mouth daily as needed for severe constipation. 01/30/16  Yes Burns, Claudina Lick, MD  prazosin (MINIPRESS) 1 MG capsule Take 2 capsules in morning, 1 in evening, and 2 at  bedtime Patient taking differently: Take 1-2 mg by mouth 3 (three) times daily. Pt takes two capsules in the morning, one in the evening, and two at bedtime. 10/02/16  Yes Burns, Claudina Lick, MD  simvastatin (ZOCOR) 20 MG tablet Take 0.5 tablets (10 mg total) by mouth daily. Patient taking differently: Take 10 mg by mouth at bedtime.  03/28/16  Yes Binnie Rail, MD  Vitamin D, Ergocalciferol, (DRISDOL) 50000 units CAPS capsule Take 50,000 Units by mouth every Wednesday.   Yes [provider]    Family History Family History  Problem Relation Age of Onset  . Heart failure Mother   . Stroke Father   . Hypertension Other   . Colon cancer Neg Hx     Social History Social History  Substance Use Topics  . Smoking status: Former Smoker    Quit date: 07/08/1994  . Smokeless tobacco: Never Used  . Alcohol use Yes     Allergies   Fosamax [alendronate sodium]   Review of Systems Review of Systems  All other systems reviewed and are negative.    Physical Exam Updated Vital Signs BP (!) 169/65 (BP Location: Left Arm)   Pulse 63   Temp 98.1 F (36.7 C) (Oral)   Resp 16   Ht 4\' 11"  (1.499 m)   Wt 47.2 kg (104 lb)   SpO2 100%   BMI 21.01 kg/m   Physical Exam  Constitutional: She appears well-developed and well-nourished. No distress.  HENT:  Head: Normocephalic and atraumatic.  Neck: Neck supple.  Cardiovascular: Normal rate and regular rhythm.   Pulmonary/Chest: Effort normal and breath sounds normal. No respiratory distress. She has no wheezes. She has no rales.  Abdominal: Soft. She exhibits no distension. There is tenderness (lower abdomen). There is no rebound and no guarding.  Genitourinary: Rectal exam shows external hemorrhoid. Rectal exam shows no mass, no tenderness and anal tone normal.  Genitourinary Comments: No stool impaction.  No stool on glove.    Neurological: She is alert.  Skin: She is not diaphoretic.  Nursing note and vitals reviewed.    ED  Treatments / Results  Labs (all labs ordered are listed, but only abnormal results are displayed) Labs Reviewed  COMPREHENSIVE METABOLIC PANEL - Abnormal; Notable for the following:       Result Value   Sodium 127 (*)    Chloride 94 (*)    Glucose, Bld 107 (*)    BUN 5 (*)    All other components within normal limits  CBC WITH DIFFERENTIAL/PLATELET - Abnormal; Notable for the following:    RBC 3.28 (*)    Hemoglobin 10.1 (*)    HCT 29.0 (*)    Monocytes Absolute 1.1 (*)    All other components within normal limits  URINALYSIS, ROUTINE W REFLEX MICROSCOPIC - Abnormal; Notable for the following:    Color, Urine COLORLESS (*)    Specific  Gravity, Urine 1.001 (*)    Leukocytes, UA SMALL (*)    Bacteria, UA RARE (*)    Squamous Epithelial / LPF 0-5 (*)    All other components within normal limits  LIPASE, BLOOD    EKG  EKG Interpretation None       Radiology Ct Abdomen Pelvis W Contrast  Result Date: 01/07/2017 CLINICAL DATA:  83 year old female with abdominal and pelvic pain and distention for 2 weeks. EXAM: CT ABDOMEN AND PELVIS WITH CONTRAST TECHNIQUE: Multidetector CT imaging of the abdomen and pelvis was performed using the standard protocol following bolus administration of intravenous contrast. CONTRAST:  49mL ISOVUE-300 IOPAMIDOL (ISOVUE-300) INJECTION 61% COMPARISON:  07/01/2015 CT FINDINGS: Lower chest: No acute abnormality.  Cardiomegaly noted. Hepatobiliary: The liver and gallbladder are unremarkable except for hepatic cysts. No biliary dilatation. Pancreas: Unremarkable except for unchanged small cysts along the uncinate process Spleen: Unremarkable Adrenals/Urinary Tract: The kidneys, adrenal glands and bladder are unremarkable except for mild bilateral renal atrophy. Stomach/Bowel: No definite bowel wall thickening, inflammatory changes or bowel obstruction. The appendix is normal. A small hiatal hernia is noted. Vascular/Lymphatic: Aortic atherosclerosis. No enlarged  abdominal or pelvic lymph nodes. Reproductive: Pessary is identified.  Calcified fibroids noted. Other: No ascites, abscess or pneumoperitoneum. Musculoskeletal: No acute abnormality or suspicious bony lesion. Degenerative changes in the lumbar spine again noted. IMPRESSION: No evidence of acute abnormality. Small hiatal hernia. Cardiomegaly. Aortic Atherosclerosis (ICD10-I70.0). Electronically Signed   By: Margarette Canada M.D.   On: 01/07/2017 22:52    Procedures Procedures (including critical care time)  Medications Ordered in ED Medications  iopamidol (ISOVUE-300) 61 % injection (not administered)  sodium chloride 0.9 % bolus 500 mL (500 mLs Intravenous New Bag/Given 01/07/17 2253)  iopamidol (ISOVUE-300) 61 % injection 100 mL (70 mLs Intravenous Contrast Given 01/07/17 2231)     Initial Impression / Assessment and Plan / ED Course  I have reviewed the triage vital signs and the nursing notes.  Pertinent labs & imaging results that were available during my care of the patient were reviewed by me and considered in my medical decision making (see chart for details).     Afebrile nontoxic patient with abdominal pain, constipation, gradually worsening over 2-3 weeks.  Labs demonstrate slight worsening of hyponatremia.  CT abd/pelvis without acute abnormality, no large stool volume.  Pt also seen by Dr Zenia Resides.  Pt to be d/c home with PCP, GI follow up.  Pt will call GI dr tomorrow.  Results discussed with patient by Dr Zenia Resides.  Pt given return precautions.  Pt verbalizes understanding and agrees with plan.       Final Clinical Impressions(s) / ED Diagnoses   Final diagnoses:  Abdominal pain, unspecified abdominal location  Hyponatremia    New Prescriptions New Prescriptions   No medications on file     Clayton Bibles, [place] 01/07/17 2318

## 2017-01-07 NOTE — ED Provider Notes (Signed)
Medical screening examination/treatment/procedure(s) were conducted as a shared visit with non-physician practitioner(s) and myself.  I personally evaluated the patient during the encounter.   EKG Interpretation None     83 year old female presents with abdominal discomfort as well as distention. Loose BMs 2 weeks. Abdominal CT without evidence of obstruction. Labs are reassuring with exception of a mild hyponatremia which she does have a history of. We'll follow-up with her GI doctor   Gabrielle Leigh, MD 01/07/17 2315

## 2017-01-07 NOTE — ED Notes (Signed)
Patient ambulated to CT with assist.

## 2017-01-07 NOTE — ED Triage Notes (Signed)
Pt complains of abdominal pain, abdominal distention and loose BM's for two weeks Pt was seen at the Urgent Care and sent here for further evaluation

## 2017-01-07 NOTE — Telephone Encounter (Signed)
I have spoken with the patient. She has taken multiple treatments from Miralax purge to also using bisacodyl . She says she is still passing brown liquid. Complains of abdominal pain and nausea. Patient says 'I have stool from one end to the other, I know I do. I know I am completely blocked up. Advised to seek medical care with PCP, Urgent Care or ER if she feels she has a blockage. Explained that the medications she has used would liquify the stool. Patient is confident this is not the case.

## 2017-01-09 ENCOUNTER — Telehealth: Payer: Self-pay | Admitting: Gastroenterology

## 2017-01-09 NOTE — Telephone Encounter (Signed)
Patient was seen by the ER doctor. She had a CT abdomen and pelvis that had no acute findings or evidence of constipation. OPatient was sent home.

## 2017-01-09 NOTE — Telephone Encounter (Signed)
Spoke with the patient. She still doesn't feel well. She is uncomfortable and states she does not believe the CT scan is correct.

## 2017-01-13 ENCOUNTER — Inpatient Hospital Stay: Payer: Medicare Other | Admitting: Internal Medicine

## 2017-01-13 ENCOUNTER — Telehealth: Payer: Self-pay | Admitting: Gastroenterology

## 2017-01-13 NOTE — Telephone Encounter (Signed)
Patient continues to feel she has "some sort of intestinal blockage." She is using daily Miralax and giving herself multiple enemas. She states "muddy water with flecks" as the return. She continues to feel floated and "just miserable." She denies nausea or vomiting. Declines ER visit. She has an appointment on 01/16/17. Confirmed with the patient.

## 2017-01-13 NOTE — Progress Notes (Signed)
HPI The patient presents for evaluation of hypertension.   She is here for one year follow up.  The patient denies any new symptoms such as chest discomfort, neck or arm discomfort. There has been no new shortness of breath, PND or orthopnea. There have been no reported palpitations, presyncope or syncope.     Allergies  Allergen Reactions  . Fosamax [Alendronate Sodium] Other (See Comments)    Reaction: Bleeding gums    Current Outpatient Prescriptions  Medication Sig Dispense Refill  . aspirin EC 81 MG tablet Take 81 mg by mouth at bedtime.    . Calcium Carb-Cholecalciferol [phone] MG-UNIT CAPS Take 1 capsule by mouth 2 (two) times daily.    . cloNIDine (CATAPRES) 0.1 MG tablet Take 1/2 tablet daily if B/P sustains greater than 419 systolic (Patient taking differently: Take 0.05 mg by mouth daily as needed (if BP sustains greater than 379 systolic). ) 30 tablet 6  . diazepam (VALIUM) 10 MG tablet take 1/2 tablet by mouth every 12 hours if needed for anxiety 45 tablet 0  . diltiazem (CARDIZEM CD) 120 MG 24 hr capsule Take 1 capsule (120 mg total) by mouth daily. 90 capsule 3  . losartan (COZAAR) 100 MG tablet take 1 tablet by mouth once daily 90 tablet 2  . metoprolol succinate (TOPROL-XL) 25 MG 24 hr tablet Take 1 tablet (25 mg total) by mouth 2 (two) times daily. 180 tablet 2  . mometasone (NASONEX) 50 MCG/ACT nasal spray 2 sprays each nares q day (Patient taking differently: Place 2 sprays into the nose daily. ) 17 g 0  . Multiple Vitamin (MULTIVITAMIN WITH MINERALS) TABS tablet Take 1 tablet by mouth daily.    . multivitamin-lutein (OCUVITE-LUTEIN) CAPS capsule Take 1 capsule by mouth daily.    Marland Kitchen omega-3 acid ethyl esters (LOVAZA) 1 g capsule Take 1 g by mouth 2 (two) times daily.    . polyethylene glycol (MIRALAX / GLYCOLAX) packet Take 17 g by mouth daily as needed for severe constipation. 30 each 3  . prazosin (MINIPRESS) 1 MG capsule Take 2 capsules in morning, 1 in evening, and  2 at bedtime (Patient taking differently: Take 1-2 mg by mouth 3 (three) times daily. Pt takes two capsules in the morning, one in the evening, and two at bedtime.) 450 capsule 2  . simvastatin (ZOCOR) 20 MG tablet Take 0.5 tablets (10 mg total) by mouth daily. (Patient taking differently: Take 10 mg by mouth at bedtime. ) 90 tablet 1  . Vitamin D, Ergocalciferol, (DRISDOL) 50000 units CAPS capsule Take 50,000 Units by mouth every Wednesday.     No current facility-administered medications for this visit.     Past Medical History:  Diagnosis Date  . Anemia 12/10/2011  . ANXIETY 09/23/2007  . Arthritis   . Atrial tachycardia ([place])   . Breast cancer ([place])   . HYPERLIPIDEMIA 07/03/2009  . HYPERTENSION 07/25/2006  . MVP (mitral valve prolapse)   . Osteoarthritis   . Osteoporosis   . Prediabetes 06/15/2015    Past Surgical History:  Procedure Laterality Date  . BREAST SURGERY  08/08/11   lumpectomy  . CARPAL TUNNEL RELEASE  09/2004  . CATARACT EXTRACTION  04/2000, 03/2002   ROS  As stated in the HPI and negative for all other systems.  PHYSICAL EXAM BP (!) 126/58   Pulse (!) 57   Ht 4\' 11"  (1.499 m)   Wt 101 lb (45.8 kg)   BMI 20.40 kg/m   GENERAL:  Well appearing NECK:  No jugular venous distention, waveform within normal limits, carotid upstroke brisk and symmetric, no bruits, no thyromegaly LUNGS:  Clear to auscultation bilaterally CHEST:  Unremarkable HEART:  PMI not displaced or sustained,S1 and S2 within normal limits, no S3, no S4, no clicks, no rubs, 2/6 short apical systolic murmur, no diastolic murmurs ABD:  Flat, positive bowel sounds normal in frequency in pitch, no bruits, no rebound, no guarding, no midline pulsatile mass, no hepatomegaly, no splenomegaly EXT:  2 plus pulses throughout, no edema, no cyanosis no clubbing   EKG:  Sinus rhythm, rate 57, axis within normal limits, intervals within normal limits, sinus arrhythmia. Nonspecific ST T wave flattening.   01/15/2017   ASSESSMENT AND PLAN  HYPERTENSION -  Patient's blood pressure is at target.  No change in therapy is negative.    MURMUR -    I suspect that this is aortic sclerosis.  No further imaging is indicated.   PALPITATIONS -  These are rare.  No change in therapy is indicated.

## 2017-01-14 ENCOUNTER — Encounter: Payer: Self-pay | Admitting: Cardiology

## 2017-01-14 ENCOUNTER — Ambulatory Visit (INDEPENDENT_AMBULATORY_CARE_PROVIDER_SITE_OTHER): Payer: Medicare Other | Admitting: Cardiology

## 2017-01-14 VITALS — BP 126/58 | HR 57 | Ht 59.0 in | Wt 101.0 lb

## 2017-01-14 DIAGNOSIS — I10 Essential (primary) hypertension: Secondary | ICD-10-CM

## 2017-01-14 DIAGNOSIS — R011 Cardiac murmur, unspecified: Secondary | ICD-10-CM | POA: Diagnosis not present

## 2017-01-14 DIAGNOSIS — R002 Palpitations: Secondary | ICD-10-CM

## 2017-01-14 DIAGNOSIS — I2510 Atherosclerotic heart disease of native coronary artery without angina pectoris: Secondary | ICD-10-CM

## 2017-01-14 NOTE — Patient Instructions (Signed)

## 2017-01-15 ENCOUNTER — Encounter: Payer: Self-pay | Admitting: Cardiology

## 2017-01-16 ENCOUNTER — Ambulatory Visit (INDEPENDENT_AMBULATORY_CARE_PROVIDER_SITE_OTHER): Payer: Medicare Other | Admitting: Gastroenterology

## 2017-01-16 ENCOUNTER — Encounter: Payer: Self-pay | Admitting: Gastroenterology

## 2017-01-16 VITALS — BP 136/56 | HR 60 | Ht 59.0 in | Wt 100.8 lb

## 2017-01-16 DIAGNOSIS — K5909 Other constipation: Secondary | ICD-10-CM

## 2017-01-16 DIAGNOSIS — I2510 Atherosclerotic heart disease of native coronary artery without angina pectoris: Secondary | ICD-10-CM | POA: Diagnosis not present

## 2017-01-16 DIAGNOSIS — R140 Abdominal distension (gaseous): Secondary | ICD-10-CM | POA: Diagnosis not present

## 2017-01-16 DIAGNOSIS — K588 Other irritable bowel syndrome: Secondary | ICD-10-CM

## 2017-01-16 NOTE — Patient Instructions (Signed)
Constipation, Adult Constipation is when a person has fewer bowel movements in a week than normal, has difficulty having a bowel movement, or has stools that are dry, hard, or larger than normal. Constipation may be caused by an underlying condition. It may become worse with age if a person takes certain medicines and does not take in enough fluids. Follow these instructions at home: Eating and drinking   Eat foods that have a lot of fiber, such as fresh fruits and vegetables, whole grains, and beans.  Limit foods that are high in fat, low in fiber, or overly processed, such as french fries, hamburgers, cookies, candies, and soda.  Drink enough fluid to keep your urine clear or pale yellow. General instructions  Exercise regularly or as told by your health care provider.  Go to the restroom when you have the urge to go. Do not hold it in.  Take over-the-counter and prescription medicines only as told by your health care provider. These include any fiber supplements.  Practice pelvic floor retraining exercises, such as deep breathing while relaxing the lower abdomen and pelvic floor relaxation during bowel movements.  Watch your condition for any changes.  Keep all follow-up visits as told by your health care provider. This is important. Contact a health care provider if:  You have pain that gets worse.  You have a fever.  You do not have a bowel movement after 4 days.  You vomit.  You are not hungry.  You lose weight.  You are bleeding from the anus.  You have thin, pencil-like stools. Get help right away if:  You have a fever and your symptoms suddenly get worse.  You leak stool or have blood in your stool.  Your abdomen is bloated.  You have severe pain in your abdomen.  You feel dizzy or you faint. This information is not intended to replace advice given to you by your health care provider. Make sure you discuss any questions you have with your health care  provider. Document Released: 03/22/2004 Document Revised: 01/12/2016 Document Reviewed: 12/13/2015 Elsevier Interactive Patient Education  2017 [employer]  Use IBGard 1 capsule three times a day as needed   Use Benefiber 1 tablespoon twice a day

## 2017-01-16 NOTE — Progress Notes (Signed)
Alahni Varone Geise    [id number]    18-Sep-1925  Primary Care Physician:Burns, Claudina Lick, MD  Referring Physician: Binnie Rail, MD [place], [place] [address]  Chief complaint: Constipation  HPI: 83 yr F here with complaints of persistent constipation. She is having bowel movement 3-4 times a week for past few years and is different compared to before, she was regular with daily bowel movements most of her life.  She haa a soft bowel movement every other day with Miralax once or twice daily. She was doing rectal enema in the past because she is concerned about possible rectal impaction but would only have return of muddy liquid stool. CT abd & pelvis did not show. She is taking Miralax twice daily. She eats lots of fresh fruit and vegetables Denies any nausea, vomiting, abdominal pain, melena or bright red blood per rectum     Outpatient Encounter Prescriptions as of 01/16/2017  Medication Sig  . aspirin EC 81 MG tablet Take 81 mg by mouth at bedtime.  . Calcium Carb-Cholecalciferol [phone] MG-UNIT CAPS Take 1 capsule by mouth 2 (two) times daily.  . cloNIDine (CATAPRES) 0.1 MG tablet Take 1/2 tablet daily if B/P sustains greater than 169 systolic (Patient taking differently: Take 0.05 mg by mouth daily as needed (if BP sustains greater than 678 systolic). )  . diazepam (VALIUM) 10 MG tablet take 1/2 tablet by mouth every 12 hours if needed for anxiety  . diltiazem (CARDIZEM CD) 120 MG 24 hr capsule Take 1 capsule (120 mg total) by mouth daily.  Marland Kitchen losartan (COZAAR) 100 MG tablet take 1 tablet by mouth once daily  . metoprolol succinate (TOPROL-XL) 25 MG 24 hr tablet Take 1 tablet (25 mg total) by mouth 2 (two) times daily.  . mometasone (NASONEX) 50 MCG/ACT nasal spray 2 sprays each nares q day (Patient taking differently: Place 2 sprays into the nose daily. )  . Multiple Vitamin (MULTIVITAMIN WITH MINERALS) TABS tablet Take 1 tablet by mouth daily.  .  multivitamin-lutein (OCUVITE-LUTEIN) CAPS capsule Take 1 capsule by mouth daily.  Marland Kitchen omega-3 acid ethyl esters (LOVAZA) 1 g capsule Take 1 g by mouth 2 (two) times daily.  . polyethylene glycol (MIRALAX / GLYCOLAX) packet Take 17 g by mouth daily as needed for severe constipation.  . prazosin (MINIPRESS) 1 MG capsule Take 2 capsules in morning, 1 in evening, and 2 at bedtime (Patient taking differently: Take 1-2 mg by mouth 3 (three) times daily. Pt takes two capsules in the morning, one in the evening, and two at bedtime.)  . simvastatin (ZOCOR) 20 MG tablet Take 0.5 tablets (10 mg total) by mouth daily. (Patient taking differently: Take 10 mg by mouth at bedtime. )  . Vitamin D, Ergocalciferol, (DRISDOL) 50000 units CAPS capsule Take 50,000 Units by mouth every Wednesday.   No facility-administered encounter medications on file as of 01/16/2017.     Allergies as of 01/16/2017 - Review Complete 01/16/2017  Allergen Reaction Noted  . Fosamax [alendronate sodium] Other (See Comments) 07/17/2011    Past Medical History:  Diagnosis Date  . Anemia 12/10/2011  . ANXIETY 09/23/2007  . Arthritis   . Atrial tachycardia ([place])   . Breast cancer ([place])   . HYPERLIPIDEMIA 07/03/2009  . HYPERTENSION 07/25/2006  . MVP (mitral valve prolapse)   . Osteoarthritis   . Osteoporosis   . Prediabetes 06/15/2015    Past Surgical History:  Procedure  Laterality Date  . BREAST SURGERY  08/08/11   lumpectomy  . CARPAL TUNNEL RELEASE  09/2004  . CATARACT EXTRACTION  04/2000, 03/2002    Family History  Problem Relation Age of Onset  . Heart failure Mother   . Stroke Father   . Hypertension Other   . Colon cancer Neg Hx     Social History   Social History  . Marital status: Widowed    Spouse name: N/A  . Number of children: N/A  . Years of education: N/A   Occupational History  . Not on file.   Social History Main Topics  . Smoking status: Former Smoker    Quit date: 07/08/1994  . Smokeless tobacco:  Never Used  . Alcohol use Yes  . Drug use: No  . Sexual activity: Not Currently    Partners: Male   Other Topics Concern  . Not on file   Social History Narrative   Exercise--walking some, more in warmer weather      Review of systems: Review of Systems  Constitutional: Negative for fever and chills.  HENT: Negative.   Eyes: Negative for blurred vision.  Respiratory: Negative for cough, shortness of breath and wheezing.   Cardiovascular: Negative for chest pain and palpitations.  Gastrointestinal: as per HPI Genitourinary: Negative for dysuria, urgency, frequency and hematuria.  Musculoskeletal: Positive for myalgias, back pain and joint pain.  Skin: Negative for itching and rash.  Neurological: Negative for dizziness, tremors, focal weakness, seizures and loss of consciousness.  Endo/Heme/Allergies: Positive for seasonal allergies.  Psychiatric/Behavioral: Negative for depression, suicidal ideas and hallucinations.  All other systems reviewed and are negative.   Physical Exam: Vitals:   01/16/17 0841  BP: (!) 136/56  Pulse: 60   Body mass index is 20.36 kg/m. Gen:      No acute distress HEENT:  EOMI, sclera anicteric Neck:     No masses; no thyromegaly Lungs:    Clear to auscultation bilaterally; normal respiratory effort CV:         Regular rate and rhythm; no murmurs Abd:      + bowel sounds; soft, non-tender; no palpable masses, no distension Ext:    No edema; adequate peripheral perfusion Skin:      Warm and dry; no rash Neuro: alert and oriented x 3 Psych: normal mood and affect  Data Reviewed:  Reviewed labs, radiology imaging, old records and pertinent past GI work up   Assessment and Plan/Recommendations:  83 year old female here with complaints of constipation.  She appears to be doing well with MiraLAX daily, I reassured the patient and encouraged her to continue with increased dietary fiber and fluid intake. Continue MiraLAX daily Peppermint  oil capsule 3 times daily as needed for bloating and abdominal cramps  Return as needed  15 minutes was spent face-to-face with the patient. Greater than 50% of the time used for counseling as well as treatment plan and follow-up. She had multiple questions which were answered to her satisfaction  K. Denzil Magnuson , MD [phone] Mon-Fri 8a-5p [phone] after 5p, weekends, holidays  CC: Burns, Claudina Lick, MD

## 2017-01-20 ENCOUNTER — Other Ambulatory Visit: Payer: Self-pay | Admitting: Internal Medicine

## 2017-01-21 ENCOUNTER — Telehealth: Payer: Self-pay | Admitting: Cardiology

## 2017-01-21 NOTE — Telephone Encounter (Signed)
Returned call to patient. She states Dr. Percival Spanish gave her Rx for clonidine to use PRN. She states last night she checked her BP last night (after not checking for a few days) and her BP was high. She wanted to review instructions for PRN clonidine.   She states there were note that said she was taking medication differently. Reviewed instruction for prazosin & simvastatin w/patient and instruction are up to date with what she currently taking  Simvastatin 10mg  QHS Prazosin 1mg  - 2 caps QAM, 1 caps evening, 2 caps QHS Clonidine 0.1mg  - 0.5 tablet PRN if BP sustains greater 956 systolic

## 2017-01-21 NOTE — Telephone Encounter (Signed)
Gabrielle Warren is calling with questions about her medications . Please call

## 2017-01-24 ENCOUNTER — Telehealth: Payer: Self-pay | Admitting: Gastroenterology

## 2017-01-24 NOTE — Telephone Encounter (Signed)
The pt was advised to use miralax 3 times daily and continue fiber and increase water intake She will call if no response

## 2017-02-04 ENCOUNTER — Ambulatory Visit: Payer: Medicare Other | Admitting: Internal Medicine

## 2017-02-07 ENCOUNTER — Ambulatory Visit: Payer: Medicare Other | Admitting: Internal Medicine

## 2017-02-14 ENCOUNTER — Other Ambulatory Visit: Payer: Self-pay | Admitting: Internal Medicine

## 2017-02-14 ENCOUNTER — Telehealth: Payer: Self-pay | Admitting: Internal Medicine

## 2017-02-14 MED ORDER — DIAZEPAM 10 MG PO TABS: ORAL_TABLET | ORAL | 0 refills | Status: DC

## 2017-02-14 NOTE — Telephone Encounter (Signed)
Pt called for a refill of her diazepam (VALIUM) 10 MG tablet  Last CPE 08/05/2016 Please advise

## 2017-02-14 NOTE — Telephone Encounter (Signed)
Notified pt MD ok refill has been called into [facility].Marland KitchenJohny Warren

## 2017-02-14 NOTE — Telephone Encounter (Signed)
Ok to fill 

## 2017-02-14 NOTE — Telephone Encounter (Signed)
Check [name] registry last filled 12/23/2016 @ [facility] pls advise...Gabrielle Warren

## 2017-02-21 ENCOUNTER — Ambulatory Visit (INDEPENDENT_AMBULATORY_CARE_PROVIDER_SITE_OTHER): Payer: Medicare Other | Admitting: Gastroenterology

## 2017-02-21 ENCOUNTER — Encounter: Payer: Self-pay | Admitting: Gastroenterology

## 2017-02-21 ENCOUNTER — Encounter (INDEPENDENT_AMBULATORY_CARE_PROVIDER_SITE_OTHER): Payer: Self-pay

## 2017-02-21 VITALS — BP 140/60 | HR 82 | Ht 59.0 in | Wt 101.0 lb

## 2017-02-21 DIAGNOSIS — K641 Second degree hemorrhoids: Secondary | ICD-10-CM | POA: Diagnosis not present

## 2017-02-21 DIAGNOSIS — I2510 Atherosclerotic heart disease of native coronary artery without angina pectoris: Secondary | ICD-10-CM

## 2017-02-21 DIAGNOSIS — K5900 Constipation, unspecified: Secondary | ICD-10-CM

## 2017-02-21 NOTE — Patient Instructions (Addendum)
Continue fruits and  vegetables daily  Use Glycerin suppositories as needed   Continue fiber daily  Follow up in 1 year

## 2017-02-21 NOTE — Progress Notes (Signed)
Gabrielle Warren    [id number]    08-22-1925  Primary Care Physician:Burns, Claudina Lick, MD  Referring Physician: Binnie Rail, MD [place], [place] [address]  Chief complaint: Constipation  HPI: 83 year old female here with complaints of constipation. He feels his symptoms have improved with Metamucil three times a day. She stopped taking Miralax as it wasn't effective. She continues to eat prunes, drink prune juice and eat fresh fruits and vegetables. Her bowel habits have improved with daily bowel movements. Denies any blood per rectum. Patient takes laxative Mag citrate one third to half bottle about once or twice a month. Denies any nausea, vomiting, abdominal pain, melena or bright red blood per rectum   Previous GI history: Gabrielle Warren here with complaints of persistent constipation. She is having bowel movement 3-4 times a week for past few years and is different compared to before, she was regular with daily bowel movements most of her life.  She haa a soft bowel movement every other day with Miralax once or twice daily. She was doing rectal enema in the past because she is concerned about possible rectal impaction but would only have return of muddy liquid stool. CT abd & pelvis did not show. She is taking Miralax twice daily. She eats lots of fresh fruit and vegetables Denies any nausea, vomiting, abdominal pain, melena or bright red blood per rectum   Outpatient Encounter Prescriptions as of 02/21/2017  Medication Sig  . aspirin EC 81 MG tablet Take 81 mg by mouth at bedtime.  . Calcium Carb-Cholecalciferol [phone] MG-UNIT CAPS Take 1 capsule by mouth 2 (two) times daily.  . cloNIDine (CATAPRES) 0.1 MG tablet Take 1/2 tablet daily if B/P sustains greater than 440 systolic (Patient taking differently: Take 0.05 mg by mouth daily as needed (if BP sustains greater than 347 systolic). )  . diazepam (VALIUM) 10 MG tablet take 1/2 tablet by mouth every 12  hours if needed for anxiety  . diltiazem (CARDIZEM CD) 120 MG 24 hr capsule Take 1 capsule (120 mg total) by mouth daily.  Marland Kitchen losartan (COZAAR) 100 MG tablet take 1 tablet by mouth once daily  . metoprolol succinate (TOPROL-XL) 25 MG 24 hr tablet Take 1 tablet (25 mg total) by mouth 2 (two) times daily.  . mometasone (NASONEX) 50 MCG/ACT nasal spray 2 sprays each nares q day (Patient taking differently: Place 2 sprays into the nose daily. )  . Multiple Vitamin (MULTIVITAMIN WITH MINERALS) TABS tablet Take 1 tablet by mouth daily.  . multivitamin-lutein (OCUVITE-LUTEIN) CAPS capsule Take 1 capsule by mouth daily.  Marland Kitchen omega-3 acid ethyl esters (LOVAZA) 1 g capsule Take 1 g by mouth 2 (two) times daily.  . polyethylene glycol (MIRALAX / GLYCOLAX) packet Take 17 g by mouth daily as needed for severe constipation.  . prazosin (MINIPRESS) 1 MG capsule Take 2 capsules in morning, 1 in evening, and 2 at bedtime (Patient taking differently: Take 1-2 mg by mouth 3 (three) times daily. Pt takes two capsules in the morning, one in the evening, and two at bedtime.)  . simvastatin (ZOCOR) 20 MG tablet Take 0.5 tablets (10 mg total) by mouth daily. (Patient taking differently: Take 10 mg by mouth at bedtime. )  . simvastatin (ZOCOR) 20 MG tablet Take 1 tablet (20 mg total) by mouth daily. --- Office visit needed for further refills  . Vitamin D, Ergocalciferol, (DRISDOL) 50000 units CAPS capsule Take  50,000 Units by mouth every Wednesday.   No facility-administered encounter medications on file as of 02/21/2017.     Allergies as of 02/21/2017 - Review Complete 01/16/2017  Allergen Reaction Noted  . Fosamax [alendronate sodium] Other (See Comments) 07/17/2011    Past Medical History:  Diagnosis Date  . Anemia 12/10/2011  . ANXIETY 09/23/2007  . Arthritis   . Atrial tachycardia ([place])   . Breast cancer ([place])   . HYPERLIPIDEMIA 07/03/2009  . HYPERTENSION 07/25/2006  . MVP (mitral valve prolapse)   .  Osteoarthritis   . Osteoporosis   . Prediabetes 06/15/2015    Past Surgical History:  Procedure Laterality Date  . BREAST SURGERY  08/08/11   lumpectomy  . CARPAL TUNNEL RELEASE  09/2004  . CATARACT EXTRACTION  04/2000, 03/2002    Family History  Problem Relation Age of Onset  . Heart failure Mother   . Stroke Father   . Hypertension Other   . Colon cancer Neg Hx     Social History   Social History  . Marital status: Widowed    Spouse name: Gabrielle Warren  . Number of children: Gabrielle Warren  . Years of education: Gabrielle Warren   Occupational History  . Not on file.   Social History Main Topics  . Smoking status: Former Smoker    Quit date: 07/08/1994  . Smokeless tobacco: Never Used  . Alcohol use Yes  . Drug use: No  . Sexual activity: Not Currently    Partners: Male   Other Topics Concern  . Not on file   Social History Narrative   Exercise--walking some, more in warmer weather      Review of systems: Review of Systems  Constitutional: Negative for fever and chills.  HENT: Negative.   Eyes: Negative for blurred vision.  Respiratory: Negative for cough, shortness of breath and wheezing.   Cardiovascular: Negative for chest pain and palpitations.  Gastrointestinal: as per HPI Genitourinary: Negative for dysuria, urgency, frequency and hematuria.  Musculoskeletal: Negative for myalgias, back pain and joint pain.  Skin: Negative for itching and rash.  Neurological: Negative for dizziness, tremors, focal weakness, seizures and loss of consciousness.  Endo/Heme/Allergies: Positive for seasonal allergies.  Psychiatric/Behavioral: Negative for depression, suicidal ideas and hallucinations.  All other systems reviewed and are negative.   Physical Exam: Vitals:   02/21/17 1419  BP: 140/60  Pulse: 82   Body mass index is 20.4 kg/m. Gen:      No acute distress HEENT:  EOMI, sclera anicteric Neck:     No masses; no thyromegaly Lungs:    Clear to auscultation bilaterally; normal  respiratory effort CV:         Regular rate and rhythm; no murmurs Abd:      + bowel sounds; soft, non-tender; no palpable masses, no distension Ext:    No edema; adequate peripheral perfusion Skin:      Warm and dry; no rash Neuro: alert and oriented x 3 Psych: normal mood and affect Rectal exam: Normal anal sphincter tone, no anal fissure or external hemorrhoids. Skin tags Anoscopy: soft stool in rectal vault , small internal hemorrhoids, no active bleeding, normal dentate line, no visible nodules  Data Reviewed:  Reviewed labs, radiology imaging, old records and pertinent past GI work up  CT abd & pelvis 01/07/2017 Lower chest: No acute abnormality.  Cardiomegaly noted.  Hepatobiliary: The liver and gallbladder are unremarkable except for hepatic cysts. No biliary dilatation.  Pancreas: Unremarkable except for unchanged small cysts along the  uncinate process  Spleen: Unremarkable  Adrenals/Urinary Tract: The kidneys, adrenal glands and bladder are unremarkable except for mild bilateral renal atrophy.  Stomach/Bowel: No definite bowel wall thickening, inflammatory changes or bowel obstruction. The appendix is normal. A small hiatal hernia is noted.  Vascular/Lymphatic: Aortic atherosclerosis. No enlarged abdominal or pelvic lymph nodes.  Reproductive: Pessary is identified.  Calcified fibroids noted.  Other: No ascites, abscess or pneumoperitoneum.  Musculoskeletal: No acute abnormality or suspicious bony lesion. Degenerative changes in the lumbar spine again noted.  Assessment and Plan/Recommendations:  83 year old female with chronic constipation here for follow-up visit  Continue Metamucil 3 times daily Increase dietary fiber and fluid intake Prune juice twice daily I reassure patient and advised her to continue to current bowel regimen as she is having daily bowel movement Ok to use Mag citrate or glycerine suppository as needed but advised patient to  avoid continuous frequent use Return in 1 year or sooner if needed   15 minutes was spent face-to-face with the patient. Greater than 50% of the time used for counseling as well as treatment plan and follow-up. She had multiple questions which were answered to her satisfaction  K. Denzil Magnuson , MD [phone] Mon-Fri 8a-5p [phone] after 5p, weekends, holidays  CC: Burns, Claudina Lick, MD

## 2017-02-24 ENCOUNTER — Telehealth: Payer: Self-pay | Admitting: Internal Medicine

## 2017-03-05 NOTE — Telephone Encounter (Signed)
Pt wanted to know why she has to come in for refills of this med diltiazem (CARDIZEM CD) 120 MG 24 hr capsule [[id number]]  Pt stated that she normally get 90.  She does not think she needs to come in

## 2017-03-06 NOTE — Telephone Encounter (Signed)
Called pt no answer LMOM rx [facility] but only for 30 day per MD must f.u twice a year...Gabrielle Warren

## 2017-03-06 NOTE — Telephone Encounter (Signed)
Ok to refill.  She should be seen twice a year given her age and medical problems.

## 2017-03-06 NOTE — Telephone Encounter (Signed)
Appt made for 9/19, Pt was given MD response

## 2017-03-26 ENCOUNTER — Ambulatory Visit (INDEPENDENT_AMBULATORY_CARE_PROVIDER_SITE_OTHER): Payer: Medicare Other | Admitting: Internal Medicine

## 2017-03-26 ENCOUNTER — Encounter: Payer: Self-pay | Admitting: Internal Medicine

## 2017-03-26 ENCOUNTER — Other Ambulatory Visit (INDEPENDENT_AMBULATORY_CARE_PROVIDER_SITE_OTHER): Payer: Medicare Other

## 2017-03-26 VITALS — BP 138/72 | HR 61 | Temp 98.0°F | Resp 16 | Wt 99.0 lb

## 2017-03-26 DIAGNOSIS — D649 Anemia, unspecified: Secondary | ICD-10-CM

## 2017-03-26 DIAGNOSIS — R7303 Prediabetes: Secondary | ICD-10-CM

## 2017-03-26 DIAGNOSIS — I2510 Atherosclerotic heart disease of native coronary artery without angina pectoris: Secondary | ICD-10-CM | POA: Diagnosis not present

## 2017-03-26 DIAGNOSIS — I10 Essential (primary) hypertension: Secondary | ICD-10-CM | POA: Diagnosis not present

## 2017-03-26 DIAGNOSIS — F419 Anxiety disorder, unspecified: Secondary | ICD-10-CM | POA: Diagnosis not present

## 2017-03-26 DIAGNOSIS — E785 Hyperlipidemia, unspecified: Secondary | ICD-10-CM | POA: Diagnosis not present

## 2017-03-26 LAB — CBC WITH DIFFERENTIAL/PLATELET
Basophils Absolute: 0.1 10*3/uL (ref 0.0–0.1)
Basophils Relative: 0.6 % (ref 0.0–3.0)
EOS PCT: 1.4 % (ref 0.0–5.0)
Eosinophils Absolute: 0.1 10*3/uL (ref 0.0–0.7)
HEMATOCRIT: 34.6 % — AB (ref 36.0–46.0)
HEMOGLOBIN: 11.9 g/dL — AB (ref 12.0–15.0)
LYMPHS PCT: 22.3 % (ref 12.0–46.0)
Lymphs Abs: 1.8 10*3/uL (ref 0.7–4.0)
MCHC: 34.4 g/dL (ref 30.0–36.0)
MCV: 94.5 fl (ref 78.0–100.0)
MONO ABS: 1.1 10*3/uL — AB (ref 0.1–1.0)
Monocytes Relative: 12.9 % — ABNORMAL HIGH (ref 3.0–12.0)
Neutro Abs: 5.2 10*3/uL (ref 1.4–7.7)
Neutrophils Relative %: 62.8 % (ref 43.0–77.0)
Platelets: 206 10*3/uL (ref 150.0–400.0)
RBC: 3.66 Mil/uL — AB (ref 3.87–5.11)
RDW: 12.7 % (ref 11.5–15.5)
WBC: 8.2 10*3/uL (ref 4.0–10.5)

## 2017-03-26 LAB — HEMOGLOBIN A1C: HEMOGLOBIN A1C: 5.7 % (ref 4.6–6.5)

## 2017-03-26 LAB — IRON: IRON: 74 ug/dL (ref 42–145)

## 2017-03-26 LAB — LIPID PANEL
Cholesterol: 127 mg/dL (ref 0–200)
HDL: 73 mg/dL (ref >39.00)
LDL Cholesterol: 40 mg/dL (ref 0–99)
NONHDL: 54
Total CHOL/HDL Ratio: 2
Triglycerides: 69 mg/dL (ref 0.0–149.0)
VLDL: 13.8 mg/dL (ref 0.0–40.0)

## 2017-03-26 LAB — COMPREHENSIVE METABOLIC PANEL
ALK PHOS: 44 U/L (ref 39–117)
ALT: 13 U/L (ref 0–35)
AST: 19 U/L (ref 0–37)
Albumin: 3.9 g/dL (ref 3.5–5.2)
BILIRUBIN TOTAL: 0.6 mg/dL (ref 0.2–1.2)
BUN: 10 mg/dL (ref 6–23)
CALCIUM: 9.7 mg/dL (ref 8.4–10.5)
CO2: 31 mEq/L (ref 19–32)
CREATININE: 0.92 mg/dL (ref 0.40–1.20)
Chloride: 98 mEq/L (ref 96–112)
GFR: 61.12 mL/min (ref >60.00)
Glucose, Bld: 113 mg/dL — ABNORMAL HIGH (ref 70–99)
Potassium: 4.1 mEq/L (ref 3.5–5.1)
SODIUM: 134 meq/L — AB (ref 135–145)
TOTAL PROTEIN: 7.2 g/dL (ref 6.0–8.3)

## 2017-03-26 LAB — FERRITIN: FERRITIN: 211.6 ng/mL (ref 10.0–291.0)

## 2017-03-26 NOTE — Assessment & Plan Note (Signed)
No chest pain, shortness of breath Continue current medications Encouraged continuing regular exercise CMP, CBC

## 2017-03-26 NOTE — Progress Notes (Signed)
Subjective:    Patient ID: Gabrielle Warren, female    DOB: 1925/11/30, 83 y.o.   MRN: [id number]  HPI The patient is here for follow up.  Overall she feels well and has no concerns. She does have some arthritis pain, but continues to walk regularly. She currently lives with her daughter.  Coronary artery disease, Hypertension: She is taking her medication daily. She is compliant with a low sodium diet.  She has intermittent edema. She denies chest pain, palpitations, shortness of breath and regular headaches. She is exercising regularly - walks daily.       Hyperlipidemia: She is taking her medication daily. She is compliant with a low fat/cholesterol diet. She is exercising regularly. She denies myalgias.   Anxiety: She is taking her medication daily as prescribed. She denies any side effects from the medication. She feels her anxiety is well controlled and she is happy with her current dose of medication.    Medications and allergies reviewed with patient and updated if appropriate.  Patient Active Problem List   Diagnosis Date Noted  . Malignant neoplasm of central portion of right breast in female, estrogen receptor positive ([place]) 09/02/2016  . Constipation 12/05/2015  . Macular degeneration 06/15/2015  . Back pain 06/15/2015  . Memory difficulties 06/15/2015  . Prediabetes 06/15/2015  . Breast cancer, right breast ([place]) 09/05/2014  . Allergic rhinitis 02/01/2014  . Anemia in neoplastic disease 09/22/2013  . Invasive ductal carcinoma of breast, stage 1 ([place]) 07/18/2011  . CAD (coronary artery disease) 04/26/2011  . Hyperlipidemia 07/03/2009  . BRUIT 07/21/2008  . Anxiety 09/23/2007  . OSTEOARTHRITIS 09/23/2007  . Osteoporosis 09/23/2007  . Essential hypertension 07/25/2006    Current Outpatient Prescriptions on File Prior to Visit  Medication Sig Dispense Refill  . aspirin EC 81 MG tablet Take 81 mg by mouth at bedtime.    . Calcium Carb-Cholecalciferol  [phone] MG-UNIT CAPS Take 1 capsule by mouth 2 (two) times daily.    . cloNIDine (CATAPRES) 0.1 MG tablet Take 1/2 tablet daily if B/P sustains greater than 413 systolic (Patient taking differently: Take 0.05 mg by mouth daily as needed (if BP sustains greater than 244 systolic). ) 30 tablet 6  . diazepam (VALIUM) 10 MG tablet take 1/2 tablet by mouth every 12 hours if needed for anxiety 45 tablet 0  . diltiazem (CARDIZEM CD) 120 MG 24 hr capsule Take 1 capsule (120 mg total) by mouth daily. --- Office visit needed for further refills 30 capsule 0  . losartan (COZAAR) 100 MG tablet take 1 tablet by mouth once daily 90 tablet 2  . metoprolol succinate (TOPROL-XL) 25 MG 24 hr tablet Take 1 tablet (25 mg total) by mouth 2 (two) times daily. 180 tablet 2  . mometasone (NASONEX) 50 MCG/ACT nasal spray 2 sprays each nares q day (Patient taking differently: Place 2 sprays into the nose daily. ) 17 g 0  . Multiple Vitamin (MULTIVITAMIN WITH MINERALS) TABS tablet Take 1 tablet by mouth daily.    . multivitamin-lutein (OCUVITE-LUTEIN) CAPS capsule Take 1 capsule by mouth daily.    Marland Kitchen omega-3 acid ethyl esters (LOVAZA) 1 g capsule Take 1 g by mouth 2 (two) times daily.    . polyethylene glycol (MIRALAX / GLYCOLAX) packet Take 17 g by mouth daily as needed for severe constipation. 30 each 3  . prazosin (MINIPRESS) 1 MG capsule Take 2 capsules in morning, 1 in evening, and 2 at bedtime (Patient taking differently: Take 1-2  mg by mouth 3 (three) times daily. Pt takes two capsules in the morning, one in the evening, and two at bedtime.) 450 capsule 2  . psyllium (METAMUCIL) 58.6 % powder Take 1 packet by mouth 3 (three) times daily as needed.    . simvastatin (ZOCOR) 20 MG tablet Take 1 tablet (20 mg total) by mouth daily. --- Office visit needed for further refills 90 tablet 0  . Vitamin D, Ergocalciferol, (DRISDOL) 50000 units CAPS capsule Take 50,000 Units by mouth every Wednesday.     No current  facility-administered medications on file prior to visit.     Past Medical History:  Diagnosis Date  . Anemia 12/10/2011  . ANXIETY 09/23/2007  . Arthritis   . Atrial tachycardia ([place])   . Breast cancer ([place])   . HYPERLIPIDEMIA 07/03/2009  . HYPERTENSION 07/25/2006  . MVP (mitral valve prolapse)   . Osteoarthritis   . Osteoporosis   . Prediabetes 06/15/2015    Past Surgical History:  Procedure Laterality Date  . BREAST SURGERY  08/08/11   lumpectomy  . CARPAL TUNNEL RELEASE  09/2004  . CATARACT EXTRACTION  04/2000, 03/2002    Social History   Social History  . Marital status: Widowed    Spouse name: N/A  . Number of children: 3  . Years of education: N/A   Occupational History  . Housekeeper    Social History Main Topics  . Smoking status: Former Smoker    Quit date: 07/08/1994  . Smokeless tobacco: Never Used  . Alcohol use Yes     Comment: 1/2 can of beer with supper occ.  . Drug use: No  . Sexual activity: Not Currently    Partners: Male   Other Topics Concern  . Not on file   Social History Narrative   Exercise--walking some, more in warmer weather    Family History  Problem Relation Age of Onset  . Heart failure Mother   . Stroke Father   . Hypertension Other   . Colon cancer Neg Hx     Review of Systems  Constitutional: Positive for appetite change (slightly decreased). Negative for chills, fatigue and fever.  HENT: Positive for hearing loss. Negative for ear pain.   Respiratory: Negative for cough, shortness of breath and wheezing.   Cardiovascular: Positive for leg swelling (intermittent). Negative for chest pain and palpitations.  Gastrointestinal: Positive for constipation. Negative for abdominal pain and blood in stool.  Neurological: Negative for light-headedness and headaches.       Objective:   Vitals:   03/26/17 1521  BP: 138/72  Pulse: 61  Resp: 16  Temp: 98 F (36.7 C)  SpO2: 98%   Wt Readings from Last 3 Encounters:  03/26/17  99 lb (44.9 kg)  02/21/17 101 lb (45.8 kg)  01/16/17 100 lb 12.8 oz (45.7 kg)   Body mass index is 20 kg/m.   Physical Exam    Constitutional: Appears well-developed and well-nourished. No distress.  HENT:  Head: Normocephalic and atraumatic.  Neck: mild wax in right ear canal, no wax in left ear canal.  Normal TM's b/l. Neck supple. No tracheal deviation present. No thyromegaly present.  No cervical lymphadenopathy Cardiovascular: Normal rate, regular rhythm and normal heart sounds.   No murmur heard. No carotid bruit .  No edema Pulmonary/Chest: Effort normal and breath sounds normal. No respiratory distress. No has no wheezes. No rales.  Skin: Skin is warm and dry. Not diaphoretic.  Psychiatric: Normal mood and affect. Behavior is  normal.      Assessment & Plan:    See Problem List for Assessment and Plan of chronic medical problems.

## 2017-03-26 NOTE — Patient Instructions (Addendum)
  Test(s) ordered today. Your results will be released to MyChart (or called to you) after review, usually within 72hours after test completion. If any changes need to be made, you will be notified at that same time.  No immunizations administered today.   Medications reviewed and updated.  No changes recommended at this time.  Please followup in 6 months   

## 2017-03-26 NOTE — Assessment & Plan Note (Addendum)
BP well controlled Current regimen effective and well tolerated Continue current medications at current doses CBC, CMP 

## 2017-03-26 NOTE — Assessment & Plan Note (Addendum)
Controlled, stable Continue current dose of medication - has been on valium for years - no side effects

## 2017-03-26 NOTE — Assessment & Plan Note (Signed)
Check lipid panel  Continue daily statin Regular exercise and healthy diet encouraged  

## 2017-03-26 NOTE — Assessment & Plan Note (Signed)
Will check A1c Continue healthy diet and regular exercise

## 2017-03-26 NOTE — Assessment & Plan Note (Addendum)
Chronic  Check cbc, iron, ferritin Taking low fe No signs of bleeding

## 2017-03-28 ENCOUNTER — Other Ambulatory Visit: Payer: Self-pay | Admitting: Internal Medicine

## 2017-03-31 ENCOUNTER — Other Ambulatory Visit: Payer: Self-pay

## 2017-03-31 MED ORDER — DILTIAZEM HCL ER COATED BEADS 120 MG PO CP24: 120.0000 mg | ORAL_CAPSULE | Freq: Every day | ORAL | 5 refills | Status: DC

## 2017-03-31 MED ORDER — SIMVASTATIN 20 MG PO TABS: 20.0000 mg | ORAL_TABLET | Freq: Every day | ORAL | 1 refills | Status: DC

## 2017-03-31 NOTE — Telephone Encounter (Signed)
Routing to dr burns, please advise, thanks 

## 2017-03-31 NOTE — Telephone Encounter (Signed)
[facility]ed to [facility]

## 2017-03-31 NOTE — Telephone Encounter (Signed)
[place] controlled substance database checked.  Ok to fill medication. rx printed 

## 2017-04-08 ENCOUNTER — Other Ambulatory Visit: Payer: Self-pay | Admitting: Oncology

## 2017-04-08 DIAGNOSIS — Z9889 Other specified postprocedural states: Secondary | ICD-10-CM

## 2017-04-12 ENCOUNTER — Other Ambulatory Visit: Payer: Self-pay | Admitting: Internal Medicine

## 2017-04-14 NOTE — Telephone Encounter (Signed)
I think this dose is easier for her - will continue for now and discuss with her at her next visit.

## 2017-04-14 NOTE — Telephone Encounter (Signed)
Is patient supposed to continue this dose?

## 2017-05-14 ENCOUNTER — Other Ambulatory Visit: Payer: Self-pay | Admitting: Internal Medicine

## 2017-05-14 NOTE — Telephone Encounter (Signed)
[place] Controlled Substance Database checked. Last filled on 03/31/17

## 2017-05-14 NOTE — Telephone Encounter (Signed)
RX faxed to [facility] 

## 2017-07-02 ENCOUNTER — Ambulatory Visit
Admission: RE | Admit: 2017-07-02 | Discharge: 2017-07-02 | Disposition: A | Discharge status: Home / Self Care | Payer: Medicare Other | Source: Ambulatory Visit | Attending: Oncology | Admitting: Oncology

## 2017-07-02 DIAGNOSIS — Z9889 Other specified postprocedural states: Secondary | ICD-10-CM

## 2017-07-03 ENCOUNTER — Telehealth: Payer: Self-pay | Admitting: Internal Medicine

## 2017-07-03 MED ORDER — DIAZEPAM 10 MG PO TABS: ORAL_TABLET | ORAL | 0 refills | Status: DC

## 2017-07-03 NOTE — Telephone Encounter (Signed)
Check [place] registry last filled 05/16/2017.Marland KitchenJohny Warren

## 2017-07-03 NOTE — Telephone Encounter (Signed)
Notified pt rx has been sent to [facility] 

## 2017-07-03 NOTE — Telephone Encounter (Signed)
Copied from [place] [phone]. Topic: General - Other >> Jul 03, 2017 10:54 AM Darl Householder, RMA wrote: Reason for CRM: Medication refill request for Diazepam 10 mg to be sent to [facility] [address]

## 2017-07-15 ENCOUNTER — Other Ambulatory Visit: Payer: Self-pay | Admitting: Internal Medicine

## 2017-07-17 ENCOUNTER — Other Ambulatory Visit: Payer: Self-pay | Admitting: Internal Medicine

## 2017-07-17 NOTE — Telephone Encounter (Signed)
Should pt still be on this?

## 2017-07-25 ENCOUNTER — Other Ambulatory Visit: Payer: Self-pay | Admitting: Internal Medicine

## 2017-08-12 ENCOUNTER — Other Ambulatory Visit: Payer: Self-pay | Admitting: Internal Medicine

## 2017-08-12 NOTE — Telephone Encounter (Signed)
[place] Controlled Substance Database checked. Last filled on 07/03/17

## 2017-08-13 ENCOUNTER — Telehealth: Payer: Self-pay | Admitting: Internal Medicine

## 2017-08-13 NOTE — Telephone Encounter (Signed)
Copied from [place] [phone]. Topic: General - Other >> Aug 13, 2017 10:47 AM Neva Seat wrote: Diazepam 10 mg  4 on hand  Called pharmacy- they said to call dr  [id number] - [place], [place] [address] [place] [address] Phone: [phone] Fax: [phone]

## 2017-08-13 NOTE — Telephone Encounter (Signed)
diazepamrefill Last OV: 03/26/17 Last Refill:06/23/17 Pharmacy: [place], [place]

## 2017-08-15 NOTE — Telephone Encounter (Signed)
This is not our patient I routed the message to her PCP Billey Gosling

## 2017-08-15 NOTE — Telephone Encounter (Signed)
rx sent

## 2017-08-23 ENCOUNTER — Other Ambulatory Visit: Payer: Self-pay | Admitting: Internal Medicine

## 2017-09-18 ENCOUNTER — Other Ambulatory Visit: Payer: Self-pay | Admitting: Internal Medicine

## 2017-09-22 NOTE — Patient Instructions (Addendum)

## 2017-09-22 NOTE — Progress Notes (Addendum)
Subjective:   Gabrielle Warren is a 84 y.o. female who presents for Medicare Annual (Subsequent) preventive examination.  Review of Systems:  No ROS.  Medicare Wellness Visit. Additional risk factors are reflected in the social history.  Cardiac Risk Factors include: advanced age (>22men, >13 women);dyslipidemia;hypertension Sleep patterns: feels rested on waking, gets up 1-2 times nightly to void and sleeps 7-8 hours nightly.    Home Safety/Smoke Alarms: Feels safe in home. Smoke alarms in place.  Living environment; residence and Firearm Safety: 1-story house/ trailer, no firearms, firearms stored safely. Lives with daughter, no needs for DME, good support system Seat Belt Safety/Bike Helmet: Wears seat belt.     Objective:     Vitals: BP 138/64   Pulse 78   Resp 18   Ht 4\' 11"  (1.499 m)   Wt 99 lb (44.9 kg)   SpO2 98%   BMI 20.00 kg/m   Body mass index is 20 kg/m.  Advanced Directives 09/23/2017 01/07/2017 02/10/2016 07/01/2015 09/05/2014 08/02/2011  Does Patient Have a Medical Advance Directive? Yes No Yes No Yes Patient does not have advance directive  Type of Advance Directive [place];Living will - - - - -  Copy of [place] in Chart? No - copy requested - - - - -  Would patient like information on creating a medical advance directive? - - - No - patient declined information - -    Tobacco Social History   Tobacco Use  Smoking Status Former Smoker  . Last attempt to quit: 07/08/1994  . Years since quitting: 23.2  Smokeless Tobacco Never Used     Counseling given: Not Answered  Past Medical History:  Diagnosis Date  . Anemia 12/10/2011  . ANXIETY 09/23/2007  . Arthritis   . Atrial tachycardia ([place])   . Breast cancer ([place])   . HYPERLIPIDEMIA 07/03/2009  . HYPERTENSION 07/25/2006  . MVP (mitral valve prolapse)   . Osteoarthritis   . Osteoporosis   . Prediabetes 06/15/2015   Past Surgical History:  Procedure Laterality  Date  . BREAST LUMPECTOMY Right 08/08/2011  . BREAST SURGERY  08/08/11   lumpectomy  . CARPAL TUNNEL RELEASE  09/2004  . CATARACT EXTRACTION  04/2000, 03/2002   Family History  Problem Relation Age of Onset  . Heart failure Mother   . Stroke Father   . Hypertension Other   . Colon cancer Neg Hx    Social History   Socioeconomic History  . Marital status: Widowed    Spouse name: None  . Number of children: 3  . Years of education: None  . Highest education level: None  Social Needs  . Financial resource strain: Not hard at all  . Food insecurity - worry: Never true  . Food insecurity - inability: Never true  . Transportation needs - medical: No  . Transportation needs - non-medical: No  Occupational History  . Occupation: Housekeeper  Tobacco Use  . Smoking status: Former Smoker    Last attempt to quit: 07/08/1994    Years since quitting: 23.2  . Smokeless tobacco: Never Used  Substance and Sexual Activity  . Alcohol use: Yes    Comment: 1/2 can of beer with supper occ.  . Drug use: No  . Sexual activity: Not Currently    Partners: Male  Other Topics Concern  . None  Social History Narrative  . None    Outpatient Encounter Medications as of 09/23/2017  Medication Sig  . aspirin  EC 81 MG tablet Take 81 mg by mouth at bedtime.  . Calcium Carb-Cholecalciferol [phone] MG-UNIT CAPS Take 1 capsule by mouth 2 (two) times daily.  . cloNIDine (CATAPRES) 0.1 MG tablet Take 1/2 tablet daily if B/P sustains greater than 027 systolic (Patient taking differently: Take 0.05 mg by mouth daily as needed (if BP sustains greater than 741 systolic). )  . diazepam (VALIUM) 10 MG tablet take 1/2 tablet by mouth every 12 hours if needed  . diltiazem (CARDIZEM CD) 120 MG 24 hr capsule take 1 capsule by mouth once daily  . losartan (COZAAR) 100 MG tablet take 1 tablet by mouth once daily  . metoprolol succinate (TOPROL-XL) 25 MG 24 hr tablet take 1 tablet by mouth twice a day  . mometasone  (NASONEX) 50 MCG/ACT nasal spray 2 sprays each nares q day (Patient taking differently: Place 2 sprays into the nose daily. )  . Multiple Vitamin (MULTIVITAMIN WITH MINERALS) TABS tablet Take 1 tablet by mouth daily.  . Multiple Vitamins-Minerals (ICAPS AREDS FORMULA PO) Take 2 tablets by mouth.  . omega-3 acid ethyl esters (LOVAZA) 1 g capsule Take 1 g by mouth 2 (two) times daily.  . polyethylene glycol (MIRALAX / GLYCOLAX) packet Take 17 g by mouth daily as needed for severe constipation.  . prazosin (MINIPRESS) 1 MG capsule take 2 capsules by mouth every morning 1 capsule by mouth every evening 2 capsules by mouth at bedtime  . psyllium (METAMUCIL) 58.6 % powder Take 1 packet by mouth 3 (three) times daily as needed.  . simvastatin (ZOCOR) 20 MG tablet Take 1 tablet (20 mg total) by mouth daily.  . Vitamin D, Ergocalciferol, (DRISDOL) 50000 units CAPS capsule take 1 capsule by mouth every week  . [DISCONTINUED] multivitamin-lutein (OCUVITE-LUTEIN) CAPS capsule Take 1 capsule by mouth daily.  . [DISCONTINUED] Vitamin D, Ergocalciferol, (DRISDOL) 50000 units CAPS capsule Take 50,000 Units by mouth every Wednesday.   No facility-administered encounter medications on file as of 09/23/2017.     Activities of Daily Living In your present state of health, do you have any difficulty performing the following activities: 09/23/2017  Hearing? N  Vision? Y  Comment macular degeneration  Difficulty concentrating or making decisions? Y  Walking or climbing stairs? N  Dressing or bathing? N  Doing errands, shopping? Y  Preparing Food and eating ? N  Using the Toilet? N  In the past six months, have you accidently leaked urine? Y  Do you have problems with loss of bowel control? N  Managing your Medications? N  Managing your Finances? N  Housekeeping or managing your Housekeeping? Y  Some recent data might be hidden    Patient Care Team: Binnie Rail, MD as PCP - General (Internal Medicine)     Assessment:   This is a routine wellness examination for Gabrielle Warren. Physical assessment deferred to PCP.   Exercise Activities and Dietary recommendations Current Exercise Habits: Home exercise routine, Type of exercise: walking, Time (Minutes): 40, Frequency (Times/Week): 6, Weekly Exercise (Minutes/Week): 240, Intensity: Mild, Exercise limited by: orthopedic condition(s)  Diet (meal preparation, eat out, water intake, caffeinated beverages, dairy products, fruits and vegetables): in general, a "healthy" diet  , well balanced, states decrease appetite at time.  Discussed supplementing with ensure (samples and coupons provided, encouraged patient to increase daily water intake.  Goals    . Patient Stated     Maintain current health status, stay as active and as independent as possible  Fall Risk Fall Risk  09/23/2017 03/26/2017 12/19/2015 06/29/2015 05/11/2015  Falls in the past year? Yes No Yes No Yes  Number falls in past yr: 1 - 1 - 2 or more  Comment - - - - -  Injury with Fall? No - Yes - No  Risk for fall due to : Impaired mobility;Impaired balance/gait - Impaired balance/gait - -     Depression Screen PHQ 2/9 Scores 09/23/2017 03/26/2017 12/19/2015 06/29/2015  PHQ - 2 Score 0 0 0 0     Cognitive Function MMSE - Mini Mental State Exam 09/23/2017  Orientation to time 5  Orientation to Place 5  Registration 3  Attention/ Calculation 3  Recall 0  Language- name 2 objects 2  Language- repeat 1  Language- follow 3 step command 3  Language- read & follow direction 1  Write a sentence 1  Copy design 1  Total score 25        Immunization History  Administered Date(s) Administered  . Influenza Split 03/28/2011  . Influenza Whole 04/07/2006, 05/06/2007, 03/31/2008, 04/12/2009, 03/27/2010, 03/24/2012, 04/12/2014  . Influenza, High Dose Seasonal PF 03/10/2016  . Influenza,inj,Quad PF,6+ Mos 03/30/2013  . Influenza-Unspecified 04/17/2015, 04/12/2017  . Pneumococcal  Conjugate-13 04/04/2015  . Pneumococcal Polysaccharide-23 06/01/2004, 03/10/2016  . Td 05/21/1999, 09/26/2008   Screening Tests Health Maintenance  Topic Date Due  . MAMMOGRAM  07/02/2018  . TETANUS/TDAP  09/27/2018  . INFLUENZA VACCINE  Completed  . DEXA SCAN  Completed  . PNA vac Low Risk Adult  Completed        Plan:    Continue doing brain stimulating activities (puzzles, reading, adult coloring books, staying active) to keep memory sharp.   Continue to eat heart healthy diet (full of fruits, vegetables, whole grains, lean protein, water--limit salt, fat, and sugar intake) and increase physical activity as tolerated.  I have personally reviewed and noted the following in the patient's chart:   . Medical and social history . Use of alcohol, tobacco or illicit drugs  . Current medications and supplements . Functional ability and status . Nutritional status . Physical activity . Advanced directives . List of other physicians . Vitals . Screenings to include cognitive, depression, and falls . Referrals and appointments  In addition, I have reviewed and discussed with patient certain preventive protocols, quality metrics, and best practice recommendations. A written personalized care plan for preventive services as well as general preventive health recommendations were provided to patient.     Michiel Cowboy, RN  09/23/2017    Medical screening examination/treatment/procedure(s) were performed by non-physician practitioner and as supervising physician I was immediately available for consultation/collaboration. I agree with above. Binnie Rail, MD

## 2017-09-22 NOTE — Progress Notes (Signed)
Subjective:    Patient ID: Gabrielle Warren, female    DOB: 19-May-1926, 84 y.o.   MRN: [id number]  HPI The patient is here for follow up.  CAD, Hypertension: She is taking her medication daily. She is compliant with a low sodium diet.  She denies chest pain, palpitations, edema, shortness of breath and regular headaches. She is exercising regularly - walking.  She does not monitor her blood pressure at home.    Hyperlipidemia: She is taking her medication daily. She is compliant with a low fat/cholesterol diet. She is exercising regularly. She denies myalgias.   Prediabetes:  She is compliant with a low sugar/carbohydrate diet.  She is exercising regularly.  Anxiety: She is taking her medication daily as prescribed. She denies any side effects from the medication. She feels her anxiety is well controlled and she is happy with her current dose of medication.    Medications and allergies reviewed with patient and updated if appropriate.  Patient Active Problem List   Diagnosis Date Noted  . Malignant neoplasm of central portion of right breast in female, estrogen receptor positive ([place]) 09/02/2016  . Constipation 12/05/2015  . Macular degeneration 06/15/2015  . Back pain 06/15/2015  . Memory difficulties 06/15/2015  . Prediabetes 06/15/2015  . Breast cancer, right breast ([place]) 09/05/2014  . Allergic rhinitis 02/01/2014  . Anemia 09/22/2013  . Invasive ductal carcinoma of breast, stage 1 ([place]) 07/18/2011  . CAD (coronary artery disease) 04/26/2011  . Hyperlipidemia 07/03/2009  . BRUIT 07/21/2008  . Anxiety 09/23/2007  . OSTEOARTHRITIS 09/23/2007  . Osteoporosis 09/23/2007  . Essential hypertension 07/25/2006    Current Outpatient Medications on File Prior to Visit  Medication Sig Dispense Refill  . aspirin EC 81 MG tablet Take 81 mg by mouth at bedtime.    . Calcium Carb-Cholecalciferol [phone] MG-UNIT CAPS Take 1 capsule by mouth 2 (two) times daily.    . cloNIDine  (CATAPRES) 0.1 MG tablet Take 1/2 tablet daily if B/P sustains greater than 409 systolic (Patient taking differently: Take 0.05 mg by mouth daily as needed (if BP sustains greater than 811 systolic). ) 30 tablet 6  . diazepam (VALIUM) 10 MG tablet take 1/2 tablet by mouth every 12 hours if needed 45 tablet 0  . diltiazem (CARDIZEM CD) 120 MG 24 hr capsule take 1 capsule by mouth once daily 30 capsule 0  . losartan (COZAAR) 100 MG tablet take 1 tablet by mouth once daily 90 tablet 1  . metoprolol succinate (TOPROL-XL) 25 MG 24 hr tablet take 1 tablet by mouth twice a day 180 tablet 0  . mometasone (NASONEX) 50 MCG/ACT nasal spray 2 sprays each nares q day (Patient taking differently: Place 2 sprays into the nose daily. ) 17 g 0  . Multiple Vitamin (MULTIVITAMIN WITH MINERALS) TABS tablet Take 1 tablet by mouth daily.    Marland Kitchen omega-3 acid ethyl esters (LOVAZA) 1 g capsule Take 1 g by mouth 2 (two) times daily.    . polyethylene glycol (MIRALAX / GLYCOLAX) packet Take 17 g by mouth daily as needed for severe constipation. 30 each 3  . prazosin (MINIPRESS) 1 MG capsule take 2 capsules by mouth every morning 1 capsule by mouth every evening 2 capsules by mouth at bedtime 450 capsule 0  . psyllium (METAMUCIL) 58.6 % powder Take 1 packet by mouth 3 (three) times daily as needed.    . simvastatin (ZOCOR) 20 MG tablet Take 1 tablet (20 mg total) by mouth daily.  90 tablet 1  . Vitamin D, Ergocalciferol, (DRISDOL) 50000 units CAPS capsule take 1 capsule by mouth every week 12 capsule 0   No current facility-administered medications on file prior to visit.     Past Medical History:  Diagnosis Date  . Anemia 12/10/2011  . ANXIETY 09/23/2007  . Arthritis   . Atrial tachycardia ([place])   . Breast cancer ([place])   . HYPERLIPIDEMIA 07/03/2009  . HYPERTENSION 07/25/2006  . MVP (mitral valve prolapse)   . Osteoarthritis   . Osteoporosis   . Prediabetes 06/15/2015    Past Surgical History:  Procedure Laterality  Date  . BREAST LUMPECTOMY Right 08/08/2011  . BREAST SURGERY  08/08/11   lumpectomy  . CARPAL TUNNEL RELEASE  09/2004  . CATARACT EXTRACTION  04/2000, 03/2002    Social History   Socioeconomic History  . Marital status: Widowed    Spouse name: None  . Number of children: 3  . Years of education: None  . Highest education level: None  Social Needs  . Financial resource strain: Not hard at all  . Food insecurity - worry: Never true  . Food insecurity - inability: Never true  . Transportation needs - medical: No  . Transportation needs - non-medical: No  Occupational History  . Occupation: Housekeeper  Tobacco Use  . Smoking status: Former Smoker    Last attempt to quit: 07/08/1994    Years since quitting: 23.2  . Smokeless tobacco: Never Used  Substance and Sexual Activity  . Alcohol use: Yes    Comment: 1/2 can of beer with supper occ.  . Drug use: No  . Sexual activity: Not Currently    Partners: Male  Other Topics Concern  . None  Social History Narrative  . None    Family History  Problem Relation Age of Onset  . Heart failure Mother   . Stroke Father   . Hypertension Other   . Colon cancer Neg Hx     Review of Systems  Constitutional: Negative for appetite change, chills and fever.  Respiratory: Negative for cough, shortness of breath and wheezing.   Cardiovascular: Negative for chest pain, palpitations and leg swelling.  Neurological: Negative for light-headedness and headaches.  Psychiatric/Behavioral: The patient is not nervous/anxious.        Objective:   Vitals:   09/23/17 1444  BP: 138/64  Pulse: 78  Resp: 16  Temp: 98.1 F (36.7 C)  SpO2: 98%   BP Readings from Last 3 Encounters:  09/23/17 138/64  09/23/17 138/64  03/26/17 138/72   Wt Readings from Last 3 Encounters:  09/23/17 99 lb (44.9 kg)  09/23/17 99 lb (44.9 kg)  03/26/17 99 lb (44.9 kg)   Body mass index is 20 kg/m.   Physical Exam    Constitutional: Appears  well-developed and well-nourished. No distress.  HENT:  Head: Normocephalic and atraumatic.  Neck: Neck supple. No tracheal deviation present. No thyromegaly present.  No cervical lymphadenopathy Cardiovascular: Normal rate, regular rhythm and normal heart sounds.   No murmur heard. No carotid bruit .  No edema Pulmonary/Chest: Effort normal and breath sounds normal. No respiratory distress. No has no wheezes. No rales.  Skin: Skin is warm and dry. Not diaphoretic.  Psychiatric: Normal mood and affect. Behavior is normal.      Assessment & Plan:    See Problem List for Assessment and Plan of chronic medical problems.

## 2017-09-23 ENCOUNTER — Other Ambulatory Visit (INDEPENDENT_AMBULATORY_CARE_PROVIDER_SITE_OTHER): Payer: Medicare Other

## 2017-09-23 ENCOUNTER — Encounter: Payer: Self-pay | Admitting: Internal Medicine

## 2017-09-23 ENCOUNTER — Ambulatory Visit (INDEPENDENT_AMBULATORY_CARE_PROVIDER_SITE_OTHER): Payer: Medicare Other | Admitting: Internal Medicine

## 2017-09-23 ENCOUNTER — Ambulatory Visit (INDEPENDENT_AMBULATORY_CARE_PROVIDER_SITE_OTHER): Payer: Medicare Other | Admitting: *Deleted

## 2017-09-23 VITALS — BP 138/64 | HR 78 | Temp 98.1°F | Resp 16 | Ht 59.0 in | Wt 99.0 lb

## 2017-09-23 VITALS — BP 138/64 | HR 78 | Resp 18 | Ht 59.0 in | Wt 99.0 lb

## 2017-09-23 DIAGNOSIS — E785 Hyperlipidemia, unspecified: Secondary | ICD-10-CM

## 2017-09-23 DIAGNOSIS — I2510 Atherosclerotic heart disease of native coronary artery without angina pectoris: Secondary | ICD-10-CM | POA: Diagnosis not present

## 2017-09-23 DIAGNOSIS — Z0000 Encounter for general adult medical examination without abnormal findings: Secondary | ICD-10-CM | POA: Diagnosis not present

## 2017-09-23 DIAGNOSIS — I10 Essential (primary) hypertension: Secondary | ICD-10-CM

## 2017-09-23 DIAGNOSIS — R7303 Prediabetes: Secondary | ICD-10-CM | POA: Diagnosis not present

## 2017-09-23 DIAGNOSIS — F419 Anxiety disorder, unspecified: Secondary | ICD-10-CM

## 2017-09-23 LAB — CBC WITH DIFFERENTIAL/PLATELET
BASOS ABS: 0.1 10*3/uL (ref 0.0–0.1)
Basophils Relative: 0.9 % (ref 0.0–3.0)
Eosinophils Absolute: 0.2 10*3/uL (ref 0.0–0.7)
Eosinophils Relative: 2.4 % (ref 0.0–5.0)
HEMATOCRIT: 34.3 % — AB (ref 36.0–46.0)
HEMOGLOBIN: 11.6 g/dL — AB (ref 12.0–15.0)
LYMPHS PCT: 25.6 % (ref 12.0–46.0)
Lymphs Abs: 2 10*3/uL (ref 0.7–4.0)
MCHC: 33.7 g/dL (ref 30.0–36.0)
MCV: 93.7 fl (ref 78.0–100.0)
MONOS PCT: 12.1 % — AB (ref 3.0–12.0)
Monocytes Absolute: 0.9 10*3/uL (ref 0.1–1.0)
NEUTROS ABS: 4.5 10*3/uL (ref 1.4–7.7)
Neutrophils Relative %: 59 % (ref 43.0–77.0)
Platelets: 185 10*3/uL (ref 150.0–400.0)
RBC: 3.66 Mil/uL — AB (ref 3.87–5.11)
RDW: 12.8 % (ref 11.5–15.5)
WBC: 7.7 10*3/uL (ref 4.0–10.5)

## 2017-09-23 LAB — COMPREHENSIVE METABOLIC PANEL
ALK PHOS: 63 U/L (ref 39–117)
ALT: 15 U/L (ref 0–35)
AST: 18 U/L (ref 0–37)
Albumin: 3.9 g/dL (ref 3.5–5.2)
BUN: 14 mg/dL (ref 6–23)
CALCIUM: 9.4 mg/dL (ref 8.4–10.5)
CO2: 28 meq/L (ref 19–32)
Chloride: 97 mEq/L (ref 96–112)
Creatinine, Ser: 0.86 mg/dL (ref 0.40–1.20)
GFR: 65.99 mL/min (ref >60.00)
Glucose, Bld: 108 mg/dL — ABNORMAL HIGH (ref 70–99)
Potassium: 3.4 mEq/L — ABNORMAL LOW (ref 3.5–5.1)
Sodium: 133 mEq/L — ABNORMAL LOW (ref 135–145)
Total Bilirubin: 0.5 mg/dL (ref 0.2–1.2)
Total Protein: 7 g/dL (ref 6.0–8.3)

## 2017-09-23 LAB — LIPID PANEL
Cholesterol: 115 mg/dL (ref 0–200)
HDL: 57.5 mg/dL (ref >39.00)
LDL Cholesterol: 37 mg/dL (ref 0–99)
NONHDL: 57.22
Total CHOL/HDL Ratio: 2
Triglycerides: 101 mg/dL (ref 0.0–149.0)
VLDL: 20.2 mg/dL (ref 0.0–40.0)

## 2017-09-23 LAB — HEMOGLOBIN A1C: Hgb A1c MFr Bld: 5.7 % (ref 4.6–6.5)

## 2017-09-23 LAB — TSH: TSH: 2.81 u[IU]/mL (ref 0.35–4.50)

## 2017-09-23 MED ORDER — DILTIAZEM HCL ER COATED BEADS 120 MG PO CP24: 120.0000 mg | ORAL_CAPSULE | Freq: Every day | ORAL | 1 refills | Status: DC

## 2017-09-23 NOTE — Assessment & Plan Note (Signed)
Taking valium twice daily Controlled, stable Continue current dose of medication

## 2017-09-23 NOTE — Assessment & Plan Note (Signed)
Denies chest pain, palps, sob Continue current medications Cmp, cbc, tsh

## 2017-09-23 NOTE — Assessment & Plan Note (Signed)
Check a1c Low sugar / carb diet Stressed regular exercise   

## 2017-09-23 NOTE — Assessment & Plan Note (Signed)
Check lipid panel  Continue daily statin Regular exercise and healthy diet encouraged  

## 2017-09-23 NOTE — Assessment & Plan Note (Signed)
BP well controlled Current regimen effective and well tolerated Continue current medications at current doses cmp  

## 2017-09-23 NOTE — Patient Instructions (Signed)
Continue doing brain stimulating activities (puzzles, reading, adult coloring books, staying active) to keep memory sharp.   Continue to eat heart healthy diet (full of fruits, vegetables, whole grains, lean protein, water--limit salt, fat, and sugar intake) and increase physical activity as tolerated.   Ms. Gabrielle Warren , Thank you for taking time to come for your Medicare Wellness Visit. I appreciate your ongoing commitment to your health goals. Please review the following plan we discussed and let me know if I can assist you in the future.   These are the goals we discussed: Goals    . Patient Stated     Maintain current health status, stay as active and as independent as possible       This is a list of the screening recommended for you and due dates:  Health Maintenance  Topic Date Due  . Mammogram  07/02/2018  . Tetanus Vaccine  09/27/2018  . Flu Shot  Completed  . DEXA scan (bone density measurement)  Completed  . Pneumonia vaccines  Completed   It is important to avoid accidents which may result in broken bones.  Here are a few ideas on how to make your home safer so you will be less likely to trip or fall.  1. Use nonskid mats or non slip strips in your shower or tub, on your bathroom floor and around sinks.  If you know that you have spilled water, wipe it up! 2. In the bathroom, it is important to have properly installed grab bars on the walls or on the edge of the tub.  Towel racks are NOT strong enough for you to hold onto or to pull on for support. 3. Stairs and hallways should have enough light.  Add lamps or night lights if you need ore light. 4. It is good to have handrails on both sides of the stairs if possible.  Always fix broken handrails right away. 5. It is important to see the edges of steps.  Paint the edges of outdoor steps white so you can see them better.  Put colored tape on the edge of inside steps. 6. Throw-rugs are dangerous because they can slide.   Removing the rugs is the best idea, but if they must stay, add adhesive carpet tape to prevent slipping. 7. Do not keep things on stairs or in the halls.  Remove small furniture that blocks the halls as it may cause you to trip.  Keep telephone and electrical cords out of the way where you walk. 8. Always were sturdy, rubber-soled shoes for good support.  Never wear just socks, especially on the stairs.  Socks may cause you to slip or fall.  Do not wear full-length housecoats as you can easily trip on the bottom.  9. Place the things you use the most on the shelves that are the easiest to reach.  If you use a stepstool, make sure it is in good condition.  If you feel unsteady, DO NOT climb, ask for help. 10. If a health professional advises you to use a cane or walker, do not be ashamed.  These items can keep you from falling and breaking your bones.

## 2017-09-24 ENCOUNTER — Encounter: Payer: Self-pay | Admitting: Internal Medicine

## 2017-09-26 ENCOUNTER — Other Ambulatory Visit: Payer: Self-pay | Admitting: Internal Medicine

## 2017-09-30 ENCOUNTER — Other Ambulatory Visit: Payer: Self-pay | Admitting: Internal Medicine

## 2017-09-30 NOTE — Telephone Encounter (Signed)
[place] Controlled Substance Database checked. Last filled on 08/16/17  Please advise on Vit D refill

## 2017-10-10 ENCOUNTER — Other Ambulatory Visit: Payer: Self-pay | Admitting: Internal Medicine

## 2017-10-23 ENCOUNTER — Other Ambulatory Visit: Payer: Self-pay | Admitting: Emergency Medicine

## 2017-10-23 NOTE — Telephone Encounter (Signed)
Copied from [place] [phone]. Topic: General - Other >> Oct 23, 2017  2:01 PM Carolyn Stare wrote:  Pt call and wanted to ask Dr Quay Burow if it would be ok for her to [facility] it is something she seen on TV  for memory or if it would cause harm her other meds.   [phone]

## 2017-10-26 NOTE — Telephone Encounter (Signed)
It is ok for her to try.

## 2017-10-27 NOTE — Telephone Encounter (Signed)
Spoke with pt to inform.   [place] Controlled Substance Database checked. Last filled on 09/30/17. RX was only filled for 30 tab.

## 2017-10-28 MED ORDER — DIAZEPAM 10 MG PO TABS: ORAL_TABLET | ORAL | 0 refills | Status: DC

## 2017-11-26 ENCOUNTER — Other Ambulatory Visit: Payer: Self-pay | Admitting: Internal Medicine

## 2017-11-26 MED ORDER — DIAZEPAM 10 MG PO TABS: ORAL_TABLET | ORAL | 1 refills | Status: DC

## 2017-11-26 NOTE — Telephone Encounter (Signed)
Copied from [place] [phone]. Topic: Quick Communication - Rx Refill/Question >> Nov 26, 2017  9:10 AM Aurelio Brash B wrote: Medication: diazepam (VALIUM) 10 MG tablet  Has the patient contacted their pharmacy? no (Agent: If no, request that the patient contact the pharmacy for the refill.) (Agent: If yes, when and what did the pharmacy advise?)  Preferred Pharmacy (with phone number or street name): [id number] [place], [place] [phone] (Phone) [phone] (Fax)      Agent: Please be advised that RX refills may take up to 3 business days. We ask that you follow-up with your pharmacy.

## 2017-11-26 NOTE — Telephone Encounter (Signed)
Rx refill request: valium 10 mg        Last filled: 10/28/17  # 30  LOV: 09/23/17  PCP: Quay Burow  Pharmacy: verified

## 2017-11-26 NOTE — Telephone Encounter (Signed)
[place] Controlled Substance Database checked. Last filled on 10/28/17

## 2017-12-10 ENCOUNTER — Other Ambulatory Visit: Payer: Self-pay | Admitting: Internal Medicine

## 2017-12-22 ENCOUNTER — Telehealth: Payer: Self-pay | Admitting: Cardiology

## 2017-12-22 NOTE — Telephone Encounter (Signed)
Yes

## 2017-12-22 NOTE — Telephone Encounter (Signed)
New Message:      Pt is calling to inquire on whether she can take PregnaGen to increase memory loss. Pt would like a call back after 4:00

## 2017-12-22 NOTE — Telephone Encounter (Signed)
Patient would like to know if she can take Prevagen for memory. Will route to [facility] to review with current meds & MD

## 2017-12-23 NOTE — Telephone Encounter (Signed)
Pt made aware she can take prevagen.

## 2018-01-14 ENCOUNTER — Telehealth: Payer: Self-pay | Admitting: Internal Medicine

## 2018-01-14 NOTE — Telephone Encounter (Signed)
She does not need the penicillin - she can always call her cardiologist to check if she wants.

## 2018-01-14 NOTE — Telephone Encounter (Signed)
Copied from [facility] [id number]. Topic: Quick Communication - See Telephone Encounter >> Jan 14, 2018 11:05 AM Percell Belt A wrote: CRM for notification. See Telephone encounter for: 01/14/18.  Pt called in and stated she is going to dentist tomorrow and having a cleaning.  She is suppose to take antibiotic before she goes each time.  She stated that she is completely out and needs some call    [facility] [place], [place] [phone] (Phone) in today so she can take today for her appt tomorrow

## 2018-01-14 NOTE — Telephone Encounter (Signed)
Spoke with pt to inform.  

## 2018-01-14 NOTE — Telephone Encounter (Signed)
Spoke with pt, states she has taken the penicillin in the past for the heart disease and HTN. Advised her that we have never prescribed this for her before. States when she was called to be reminded of her appt, was told to take her penicillin 1 hr before appt.

## 2018-01-14 NOTE — Telephone Encounter (Signed)
Why does she need the antibiotics for - did she have a joint replacement.   She does not need it her mitral valve prolapse.

## 2018-01-18 ENCOUNTER — Other Ambulatory Visit: Payer: Self-pay | Admitting: Internal Medicine

## 2018-01-20 NOTE — Progress Notes (Signed)
HPI The patient presents for evaluation of hypertension.   She is here for one year follow up.  She has done well.  The patient denies any new symptoms such as chest discomfort, neck or arm discomfort. There has been no new shortness of breath, PND or orthopnea. There have been no reported palpitations, presyncope or syncope.   She walks every day.     Allergies  Allergen Reactions  . Fosamax [Alendronate Sodium] Other (See Comments)    Reaction: Bleeding gums    Current Outpatient Medications  Medication Sig Dispense Refill  . aspirin EC 81 MG tablet Take 81 mg by mouth at bedtime.    . Calcium Carb-Cholecalciferol [phone] MG-UNIT CAPS Take 1 capsule by mouth 2 (two) times daily.    . cloNIDine (CATAPRES) 0.1 MG tablet Take 1/2 tablet daily if B/P sustains greater than 474 systolic 90 tablet 3  . diazepam (VALIUM) 10 MG tablet Take 1/2 tablet as needed every 12 hours. 30 tablet 1  . diltiazem (CARDIZEM CD) 120 MG 24 hr capsule TAKE 1 CAPSULE BY MOUTH ONCE DAILY 90 capsule 3  . losartan (COZAAR) 100 MG tablet Take 1 tablet (100 mg total) by mouth daily. 90 tablet 2  . metoprolol succinate (TOPROL-XL) 25 MG 24 hr tablet TAKE 1 TABLET BY MOUTH TWICE A DAY 180 tablet 1  . mometasone (NASONEX) 50 MCG/ACT nasal spray 2 sprays each nares q day (Patient taking differently: Place 2 sprays into the nose daily. ) 17 g 0  . Multiple Vitamin (MULTIVITAMIN WITH MINERALS) TABS tablet Take 1 tablet by mouth daily.    . Multiple Vitamins-Minerals (ICAPS AREDS FORMULA PO) Take 2 tablets by mouth.    . omega-3 acid ethyl esters (LOVAZA) 1 g capsule Take 1 g by mouth 2 (two) times daily.    . polyethylene glycol (MIRALAX / GLYCOLAX) packet Take 17 g by mouth daily as needed for severe constipation. 30 each 3  . prazosin (MINIPRESS) 1 MG capsule TAKE 2 CAPSULES BY MOUTH EVERY MORNING 1 EVERY EVENING AND 2 AT BEDTIME 450 capsule 3  . psyllium (METAMUCIL) 58.6 % powder Take 1 packet by mouth 3 (three) times  daily as needed.    . simvastatin (ZOCOR) 20 MG tablet TAKE 1 TABLET BY MOUTH ONCE DAILY 90 tablet 3  . Vitamin D, Ergocalciferol, (DRISDOL) 50000 units CAPS capsule TAKE 1 CAPSULE BY MOUTH EVERY WEEK 12 capsule 0   No current facility-administered medications for this visit.     Past Medical History:  Diagnosis Date  . Anemia 12/10/2011  . ANXIETY 09/23/2007  . Arthritis   . Atrial tachycardia ([place])   . Breast cancer ([place])   . HYPERLIPIDEMIA 07/03/2009  . HYPERTENSION 07/25/2006  . Macular degeneration   . MVP (mitral valve prolapse)   . Osteoarthritis   . Osteoporosis   . Prediabetes 06/15/2015    Past Surgical History:  Procedure Laterality Date  . BREAST LUMPECTOMY Right 08/08/2011  . BREAST SURGERY  08/08/11   lumpectomy  . CARPAL TUNNEL RELEASE  09/2004  . CATARACT EXTRACTION  04/2000, 03/2002   ROS    Decreased memory.  Otherwise as stated in the HPI and negative for all other systems.    PHYSICAL EXAM BP (!) 150/52 (BP Location: Left Arm, Patient Position: Sitting, Cuff Size: Normal)   Pulse (!) 57   Ht 4\' 11"  (1.499 m)   Wt 102 lb (46.3 kg)   BMI 20.60 kg/m   GENERAL:  Well appearing NECK:  No jugular venous distention, waveform within normal limits, carotid upstroke brisk and symmetric, no bruits, no thyromegaly LUNGS:  Clear to auscultation bilaterally CHEST:  Unremarkable HEART:  PMI not displaced or sustained,S1 and S2 within normal limits, no S3, no S4, no clicks, no rubs, 2 out of 6 apical systolic murmur radiating slightly at the aortic outflow tract with a slight increase with Valsalva, no diastolic murmurs ABD:  Flat, positive bowel sounds normal in frequency in pitch, no bruits, no rebound, no guarding, no midline pulsatile mass, no hepatomegaly, no splenomegaly EXT:  2 plus pulses throughout, no edema, no cyanosis no clubbing    EKG:  Sinus rhythm, rate 57, axis within normal limits, intervals within normal limits, sinus arrhythmia. Nonspecific ST T wave  flattening.  01/22/2018   ASSESSMENT AND PLAN  HYPERTENSION -  Patient's blood pressure is she will be rate is perfectly normal great genes he is just finishing your paperwork.  No change in therapy.   MURMUR -    I suspect that this is aortic sclerosis.    I will check an echocardiogram.    PALPITATIONS -  She no longer has these.   LEG SWELLING - This is mild.  No change in therapy.

## 2018-01-22 ENCOUNTER — Encounter

## 2018-01-22 ENCOUNTER — Ambulatory Visit (INDEPENDENT_AMBULATORY_CARE_PROVIDER_SITE_OTHER): Payer: Medicare Other | Admitting: Cardiology

## 2018-01-22 ENCOUNTER — Encounter: Payer: Self-pay | Admitting: Cardiology

## 2018-01-22 VITALS — BP 150/52 | HR 57 | Ht 59.0 in | Wt 102.0 lb

## 2018-01-22 DIAGNOSIS — I10 Essential (primary) hypertension: Secondary | ICD-10-CM | POA: Diagnosis not present

## 2018-01-22 DIAGNOSIS — I2510 Atherosclerotic heart disease of native coronary artery without angina pectoris: Secondary | ICD-10-CM

## 2018-01-22 DIAGNOSIS — M7989 Other specified soft tissue disorders: Secondary | ICD-10-CM | POA: Diagnosis not present

## 2018-01-22 DIAGNOSIS — R011 Cardiac murmur, unspecified: Secondary | ICD-10-CM | POA: Diagnosis not present

## 2018-01-22 MED ORDER — LOSARTAN POTASSIUM 100 MG PO TABS: 100.0000 mg | ORAL_TABLET | Freq: Every day | ORAL | 2 refills | Status: DC

## 2018-01-22 MED ORDER — CLONIDINE HCL 0.1 MG PO TABS: ORAL_TABLET | ORAL | 3 refills | Status: DC

## 2018-01-22 NOTE — Patient Instructions (Signed)

## 2018-01-29 ENCOUNTER — Other Ambulatory Visit: Payer: Self-pay | Admitting: Internal Medicine

## 2018-01-29 NOTE — Telephone Encounter (Signed)
Copied from [place] [id number]. Topic: Quick Communication - Rx Refill/Question >> Jan 29, 2018 11:31 AM Selinda Flavin B, NT wrote: Medication: diazepam (VALIUM) 10 MG tablet   Has the patient contacted their pharmacy? Yes.   (Agent: If no, request that the patient contact the pharmacy for the refill.) (Agent: If yes, when and what did the pharmacy advise?)  Preferred Pharmacy (with phone number or street name): [id number] - [place], [place]: Please be advised that RX refills may take up to 3 business days. We ask that you follow-up with your pharmacy.

## 2018-01-30 MED ORDER — DIAZEPAM 10 MG PO TABS: ORAL_TABLET | ORAL | 0 refills | Status: DC

## 2018-01-30 NOTE — Telephone Encounter (Signed)
Valium  LOV 09/23/17 Dr. Quay Burow  LRF 11/26/17  #30  1 refill    [id number] - [place], [place]

## 2018-01-30 NOTE — Telephone Encounter (Signed)
MD is out of the office. Check [place] registry last filled 12/29/2017 pls advise.Marland KitchenJohny Chess

## 2018-02-02 ENCOUNTER — Other Ambulatory Visit (HOSPITAL_COMMUNITY): Payer: Medicare Other

## 2018-02-02 NOTE — Telephone Encounter (Signed)
Mickel Baas approved in Dr. Quay Burow absence and sent to pof.Marland KitchenJohny Chess

## 2018-03-02 ENCOUNTER — Other Ambulatory Visit: Payer: Self-pay | Admitting: Internal Medicine

## 2018-03-02 ENCOUNTER — Telehealth: Payer: Self-pay | Admitting: Internal Medicine

## 2018-03-02 MED ORDER — DIAZEPAM 10 MG PO TABS: ORAL_TABLET | ORAL | 0 refills | Status: DC

## 2018-03-02 NOTE — Telephone Encounter (Signed)
Last refill of Valium was 01/30/18 per database.  Last OV 09/23/17 Next OV not made

## 2018-03-02 NOTE — Telephone Encounter (Signed)
Copied from [place] [id number]. Topic: Quick Communication - Rx Refill/Question >> Mar 02, 2018 12:03 PM Sheran Luz wrote: Medication: diazepam (VALIUM) 10 MG tablet [[id number]] and Vitamin D, Ergocalciferol, (DRISDOL) 50000 units CAPS capsule [[id number]]    Preferred Pharmacy (with phone number or street name): [id number] [place], [place] [phone] (Phone) [phone] (Fax)

## 2018-03-08 ENCOUNTER — Other Ambulatory Visit: Payer: Self-pay | Admitting: Internal Medicine

## 2018-03-10 ENCOUNTER — Other Ambulatory Visit: Payer: Self-pay | Admitting: Internal Medicine

## 2018-03-17 ENCOUNTER — Encounter: Payer: Self-pay | Admitting: Family

## 2018-03-17 ENCOUNTER — Ambulatory Visit (INDEPENDENT_AMBULATORY_CARE_PROVIDER_SITE_OTHER): Payer: Medicare Other | Admitting: Family

## 2018-03-17 VITALS — BP 130/70 | HR 60 | Temp 97.6°F | Ht 59.0 in | Wt 99.0 lb

## 2018-03-17 DIAGNOSIS — B029 Zoster without complications: Secondary | ICD-10-CM

## 2018-03-17 MED ORDER — TRIAMCINOLONE ACETONIDE 0.1 % EX CREA: 1.0000 "application " | TOPICAL_CREAM | Freq: Two times a day (BID) | CUTANEOUS | 0 refills | Status: DC

## 2018-03-17 MED ORDER — VALACYCLOVIR HCL 1 G PO TABS: 1000.0000 mg | ORAL_TABLET | Freq: Three times a day (TID) | ORAL | 0 refills | Status: DC

## 2018-03-17 NOTE — Progress Notes (Signed)
Gabrielle Warren is a 84 y.o. female with the following history as recorded in EpicCare:  Patient Active Problem List   Diagnosis Date Noted  . Malignant neoplasm of central portion of right breast in female, estrogen receptor positive ([place]) 09/02/2016  . Constipation 12/05/2015  . Macular degeneration 06/15/2015  . Back pain 06/15/2015  . Memory difficulties 06/15/2015  . Prediabetes 06/15/2015  . Breast cancer, right breast ([place]) 09/05/2014  . Allergic rhinitis 02/01/2014  . Anemia 09/22/2013  . Invasive ductal carcinoma of breast, stage 1 ([place]) 07/18/2011  . CAD (coronary artery disease) 04/26/2011  . Hyperlipidemia 07/03/2009  . BRUIT 07/21/2008  . Anxiety 09/23/2007  . OSTEOARTHRITIS 09/23/2007  . Osteoporosis 09/23/2007  . Essential hypertension 07/25/2006    Current Outpatient Medications  Medication Sig Dispense Refill  . aspirin EC 81 MG tablet Take 81 mg by mouth at bedtime.    . Calcium Carb-Cholecalciferol [phone] MG-UNIT CAPS Take 1 capsule by mouth 2 (two) times daily.    . cloNIDine (CATAPRES) 0.1 MG tablet Take 1/2 tablet daily if B/P sustains greater than 350 systolic 90 tablet 3  . diazepam (VALIUM) 10 MG tablet Take 1/2 tablet as needed every 12 hours. 30 tablet 0  . diltiazem (CARDIZEM CD) 120 MG 24 hr capsule TAKE 1 CAPSULE BY MOUTH ONCE DAILY 90 capsule 3  . losartan (COZAAR) 100 MG tablet Take 1 tablet (100 mg total) by mouth daily. 90 tablet 2  . metoprolol succinate (TOPROL-XL) 25 MG 24 hr tablet Take 1 tablet (25 mg total) by mouth 2 (two) times daily. Need office visit for more refills. 60 tablet 0  . Multiple Vitamin (MULTIVITAMIN WITH MINERALS) TABS tablet Take 1 tablet by mouth daily.    . Multiple Vitamins-Minerals (ICAPS AREDS FORMULA PO) Take 2 tablets by mouth.    . omega-3 acid ethyl esters (LOVAZA) 1 g capsule Take 1 g by mouth 2 (two) times daily.    . prazosin (MINIPRESS) 1 MG capsule TAKE 2 CAPSULES BY MOUTH EVERY MORNING 1 EVERY EVENING  AND 2 AT BEDTIME 450 capsule 3  . psyllium (METAMUCIL) 58.6 % powder Take 1 packet by mouth 3 (three) times daily as needed.    . simvastatin (ZOCOR) 20 MG tablet TAKE 1 TABLET BY MOUTH ONCE DAILY 90 tablet 3  . Vitamin D, Ergocalciferol, (DRISDOL) 50000 units CAPS capsule TAKE 1 CAPSULE BY MOUTH EVERY WEEK 12 capsule 0  . triamcinolone cream (KENALOG) 0.1 % Apply 1 application topically 2 (two) times daily. 45 g 0  . valACYclovir (VALTREX) 1000 MG tablet Take 1 tablet (1,000 mg total) by mouth 3 (three) times daily. 21 tablet 0   No current facility-administered medications for this visit.     Allergies: Fosamax [alendronate sodium]  Past Medical History:  Diagnosis Date  . Anemia 12/10/2011  . ANXIETY 09/23/2007  . Arthritis   . Atrial tachycardia ([place])   . Breast cancer ([place])   . HYPERLIPIDEMIA 07/03/2009  . HYPERTENSION 07/25/2006  . Macular degeneration   . MVP (mitral valve prolapse)   . Osteoarthritis   . Osteoporosis   . Prediabetes 06/15/2015    Past Surgical History:  Procedure Laterality Date  . BREAST LUMPECTOMY Right 08/08/2011  . BREAST SURGERY  08/08/11   lumpectomy  . CARPAL TUNNEL RELEASE  09/2004  . CATARACT EXTRACTION  04/2000, 03/2002    Family History  Problem Relation Age of Onset  . Heart failure Mother   . Stroke Father   . Hypertension Other   .  Colon cancer Neg Hx     Social History   Tobacco Use  . Smoking status: Former Smoker    Last attempt to quit: 07/08/1994    Years since quitting: 23.7  . Smokeless tobacco: Never Used  Substance Use Topics  . Alcohol use: Yes    Comment: 1/2 can of beer with supper occ.    Subjective:  Patient presents with concerns for rash on her right buttock/ hip area x 4 days; denies any new soaps, foods, detergents or medications. Notes that did experience some pain in the area prior to the onset of the rash; Took Zostavax- not Shingrix;     Objective:  Vitals:   03/17/18 1531  BP: 130/70  Pulse: 60  Temp:  97.6 F (36.4 C)  TempSrc: Oral  SpO2: 96%  Weight: 99 lb 0.6 oz (44.9 kg)  Height: 4\' 11"  (1.499 m)    General: Well developed, well nourished, in no acute distress  Skin : Warm and dry. C/w shingles on right flank; Head: Normocephalic and atraumatic  Lungs: Respirations unlabored; clear to auscultation bilaterally without wheeze, rales, rhonchi  Neurologic: Alert and oriented; speech intact; face symmetrical; moves all extremities well; CNII-XII intact without focal deficit   Assessment:  1. Herpes zoster without complication     Plan:  Rx for Valtrex 1 gm tid x 7 days, Rx for Triamcinolone cream- apply bid to affected area; follow-up worse, no better.   No follow-ups on file.  No orders of the defined types were placed in this encounter.   Requested Prescriptions   Signed Prescriptions Disp Refills  . valACYclovir (VALTREX) 1000 MG tablet 21 tablet 0    Sig: Take 1 tablet (1,000 mg total) by mouth 3 (three) times daily.  Marland Kitchen triamcinolone cream (KENALOG) 0.1 % 45 g 0    Sig: Apply 1 application topically 2 (two) times daily.

## 2018-03-17 NOTE — Patient Instructions (Signed)

## 2018-03-23 ENCOUNTER — Ambulatory Visit: Payer: Self-pay | Admitting: Internal Medicine

## 2018-03-23 NOTE — Telephone Encounter (Signed)
Pt called to notify that she wanted to stop the Valtrex due to her itchy throat. Pt began to cough and stated that she has a runny nose. Pt advised not to stop Valtrex. Pt advised that her symptoms are most likely caused by her upper respiratory issues.  Pt with frequent congested cough with production of beige and gray phlegm. Pt denies coughing up blood or having a fever. Pt c/o her hearing being "muffled" but denied earache. Pt denies chest pain or shortness of breath.  Home care advice given and pt verbalized understanding.   Reason for Disposition . Cough with cold symptoms (e.g., runny nose, postnasal drip, throat clearing)  Answer Assessment - Initial Assessment Questions 1. ONSET: "When did the cough begin?"      Sunday 2. SEVERITY: "How bad is the cough today?"      Frequency congested cough 3. RESPIRATORY DISTRESS: "Describe your breathing."      No wheezing or Shortness of breath 4. FEVER: "Do you have a fever?" If so, ask: "What is your temperature, how was it measured, and when did it start?"    No fever 5. SPUTUM: "Describe the color of your sputum" (clear, white, yellow, green)     Grey phlegm , beige 6. HEMOPTYSIS: "Are you coughing up any blood?" If so ask: "How much?" (flecks, streaks, tablespoons, etc.)     no 7. CARDIAC HISTORY: "Do you have any history of heart disease?" (e.g., heart attack, congestive heart failure)      HTN, coronary artery disease, questionable heart attack 20 years ago 8. LUNG HISTORY: "Do you have any history of lung disease?"  (e.g., pulmonary embolus, asthma, emphysema)     no 9. PE RISK FACTORS: "Do you have a history of blood clots?" (or: recent major surgery, recent prolonged travel, bedridden)     no 10. OTHER SYMPTOMS: "Do you have any other symptoms?" (e.g., runny nose, wheezing, chest pain)       Runny nose, itchy throat, hearing muffled 11. PREGNANCY: "Is there any chance you are pregnant?" "When was your last menstrual period?"  n/a 12. TRAVEL: "Have you traveled out of the country in the last month?" (e.g., travel history, exposures)       no  Protocols used: [place]

## 2018-03-27 ENCOUNTER — Ambulatory Visit: Payer: Self-pay | Admitting: Internal Medicine

## 2018-03-27 NOTE — Telephone Encounter (Signed)
Patient is requesting antibiotic- she was seen in the office on Wednesday. She does not have transportion to the office today. Her cough is productive- but she feels she needs antibiotic for her congestion- no pain in lungs at this point. Best contact # [phone]. [facility] uses [facility]  Reason for Disposition . Cough has been present for > 3 weeks  Answer Assessment - Initial Assessment Questions 1. ONSET: "When did the cough begin?"      03/25/18 2. SEVERITY: "How bad is the cough today?"      2-3 times/hours 3. RESPIRATORY DISTRESS: "Describe your breathing."      Not having problem breathing 4. FEVER: "Do you have a fever?" If so, ask: "What is your temperature, how was it measured, and when did it start?"     No fever 5. SPUTUM: "Describe the color of your sputum" (clear, white, yellow, green)     Grayish- with red spots- now it is clearer 6. HEMOPTYSIS: "Are you coughing up any blood?" If so ask: "How much?" (flecks, streaks, tablespoons, etc.)     Not any now- sees specks- hard to tell 7. CARDIAC HISTORY: "Do you have any history of heart disease?" (e.g., heart attack, congestive heart failure)      Yes 8. LUNG HISTORY: "Do you have any history of lung disease?"  (e.g., pulmonary embolus, asthma, emphysema)     no 9. PE RISK FACTORS: "Do you have a history of blood clots?" (or: recent major surgery, recent prolonged travel, bedridden)     no 10. OTHER SYMPTOMS: "Do you have any other symptoms?" (e.g., runny nose, wheezing, chest pain)       Wheezing seems to have gotten some better, runny nose 11. PREGNANCY: "Is there any chance you are pregnant?" "When was your last menstrual period?"       n/a 12. TRAVEL: "Have you traveled out of the country in the last month?" (e.g., travel history, exposures)       n/a  Protocols used: [place]

## 2018-03-27 NOTE — Telephone Encounter (Signed)
Spoke with pt and advised she needed an appointment for an antibiotic. She stated that she will wait and to forget the whole thing. She will call back if she feels like she needs an appointment.

## 2018-03-30 ENCOUNTER — Other Ambulatory Visit: Payer: Self-pay | Admitting: Internal Medicine

## 2018-03-30 NOTE — Telephone Encounter (Signed)
 Controlled Substance Database checked. Last filled on 03/03/18  Last OV 09/23/2017

## 2018-04-03 ENCOUNTER — Telehealth: Payer: Self-pay

## 2018-04-03 NOTE — Telephone Encounter (Signed)
Copied from [place] [id number]. Topic: General - Other >> Apr 03, 2018  1:40 PM Mcneil, Ja-Kwan wrote: Reason for CRM: Pt states she is still coughing and she feels she needs an antibiotic. Pt requests a call back. Cb# [phone]

## 2018-04-03 NOTE — Telephone Encounter (Signed)
Appt made

## 2018-04-04 ENCOUNTER — Ambulatory Visit: Payer: Medicare Other | Admitting: Family Medicine

## 2018-04-07 ENCOUNTER — Other Ambulatory Visit: Payer: Self-pay | Admitting: Internal Medicine

## 2018-04-08 ENCOUNTER — Telehealth: Payer: Self-pay | Admitting: Cardiology

## 2018-04-08 ENCOUNTER — Other Ambulatory Visit: Payer: Self-pay

## 2018-04-08 MED ORDER — METOPROLOL SUCCINATE ER 25 MG PO TB24: 25.0000 mg | ORAL_TABLET | Freq: Two times a day (BID) | ORAL | 0 refills | Status: DC

## 2018-04-08 MED ORDER — METOPROLOL SUCCINATE ER 25 MG PO TB24: 25.0000 mg | ORAL_TABLET | Freq: Two times a day (BID) | ORAL | 1 refills | Status: DC

## 2018-04-08 NOTE — Telephone Encounter (Signed)
New message   *STAT* If patient is at the pharmacy, call can be transferred to refill team.   1. Which medications need to be refilled? (please list name of each medication and dose if known)metoprolol succinate (TOPROL-XL) 25 MG 24 hr tablet  2. Which pharmacy/location (including street and city if local pharmacy) is medication to be sent to?[facility] [phone] - [place], [place]  3. Do they need a 30 day or 90 day supply? [place]

## 2018-04-20 ENCOUNTER — Other Ambulatory Visit: Payer: Self-pay | Admitting: Internal Medicine

## 2018-04-25 ENCOUNTER — Other Ambulatory Visit: Payer: Self-pay | Admitting: Internal Medicine

## 2018-04-28 ENCOUNTER — Ambulatory Visit (INDEPENDENT_AMBULATORY_CARE_PROVIDER_SITE_OTHER): Payer: Medicare Other | Admitting: Internal Medicine

## 2018-04-28 ENCOUNTER — Telehealth: Payer: Self-pay | Admitting: Cardiology

## 2018-04-28 ENCOUNTER — Encounter: Payer: Self-pay | Admitting: Internal Medicine

## 2018-04-28 VITALS — BP 170/68 | HR 68 | Temp 98.6°F | Resp 14 | Ht 59.0 in | Wt 100.0 lb

## 2018-04-28 DIAGNOSIS — E785 Hyperlipidemia, unspecified: Secondary | ICD-10-CM | POA: Diagnosis not present

## 2018-04-28 DIAGNOSIS — Z853 Personal history of malignant neoplasm of breast: Secondary | ICD-10-CM

## 2018-04-28 DIAGNOSIS — F419 Anxiety disorder, unspecified: Secondary | ICD-10-CM

## 2018-04-28 DIAGNOSIS — I2510 Atherosclerotic heart disease of native coronary artery without angina pectoris: Secondary | ICD-10-CM | POA: Diagnosis not present

## 2018-04-28 DIAGNOSIS — R7303 Prediabetes: Secondary | ICD-10-CM | POA: Diagnosis not present

## 2018-04-28 DIAGNOSIS — R32 Unspecified urinary incontinence: Secondary | ICD-10-CM

## 2018-04-28 DIAGNOSIS — D649 Anemia, unspecified: Secondary | ICD-10-CM

## 2018-04-28 DIAGNOSIS — I10 Essential (primary) hypertension: Secondary | ICD-10-CM | POA: Diagnosis not present

## 2018-04-28 MED ORDER — DIAZEPAM 10 MG PO TABS: ORAL_TABLET | ORAL | 0 refills | Status: DC

## 2018-04-28 NOTE — Assessment & Plan Note (Addendum)
Dr Mesienger Has tried a pessary Will follow up with above

## 2018-04-28 NOTE — Telephone Encounter (Signed)
New Message           *STAT* If patient is at the pharmacy, call can be transferred to refill team.   1. Which medications need to be refilled? (please list name of each medication and dose if known) Diltiazem 10 mg  2. Which pharmacy/location (including street and city if local pharmacy) is medication to be sent to?[facility] on [address] [phone]  3. Do they need a 30 day or 90 day supply? [place]

## 2018-04-28 NOTE — Telephone Encounter (Signed)
Patient called in states that she needs her Diazepam medication. I did advise that her PCP was refilling that for her, and she noted that they would not give her anymore until she makes an appointment to be seen, I advised patient that she would have to go to them to be seen, as we could not refill it since she needed an appointment and since they had been refilling it. Patient understood, would call her PCP office and see what they could work out.

## 2018-04-28 NOTE — Patient Instructions (Addendum)
Schedule your mammogram for December.   Monitor your BP at home.   Tests ordered today. Your results will be released to [place] (or called to you) after review, usually within 72hours after test completion. If any changes need to be made, you will be notified at that same time.  Medications reviewed and updated.  Changes include :   none  Your prescription(s) have been submitted to your pharmacy. Please take as directed and contact our office if you believe you are having problem(s) with the medication(s).   Please followup in 6 months

## 2018-04-28 NOTE — Progress Notes (Signed)
Subjective:    Patient ID: Gabrielle Warren, female    DOB: [birth year], 84 y.o.   MRN: [id number]  HPI The patient is here for follow up.  Anxiety: She is taking her medication daily as prescribed. She denies any side effects from the medication. She feels her anxiety is well controlled and she is happy with her current dose of medication.   CAD, Hypertension: She is taking her medication daily. She is compliant with a low sodium diet.  She denies chest pain, palpitations, edema, shortness of breath and regular headaches. She is exercising regularly - walks most daily.  She does monitor her blood pressure at home - 126-130/ 70's.     H/o breast cancer:  She is no longer seeing oncology and is due for her annual breast exam.  Her mammogram is due this December.  She denies any concerns with her breast.   Medications and allergies reviewed with patient and updated if appropriate.  Patient Active Problem List   Diagnosis Date Noted  . Malignant neoplasm of central portion of right breast in female, estrogen receptor positive ([place]) 09/02/2016  . Constipation 12/05/2015  . Macular degeneration 06/15/2015  . Back pain 06/15/2015  . Memory difficulties 06/15/2015  . Prediabetes 06/15/2015  . Breast cancer, right breast ([place]) 09/05/2014  . Allergic rhinitis 02/01/2014  . Anemia 09/22/2013  . Invasive ductal carcinoma of breast, stage 1 ([place]) 07/18/2011  . CAD (coronary artery disease) 04/26/2011  . Hyperlipidemia 07/03/2009  . BRUIT 07/21/2008  . Anxiety 09/23/2007  . OSTEOARTHRITIS 09/23/2007  . Osteoporosis 09/23/2007  . Essential hypertension 07/25/2006    Current Outpatient Medications on File Prior to Visit  Medication Sig Dispense Refill  . aspirin EC 81 MG tablet Take 81 mg by mouth at bedtime.    . Calcium Carb-Cholecalciferol [phone] MG-UNIT CAPS Take 1 capsule by mouth 2 (two) times daily.    . cloNIDine (CATAPRES) 0.1 MG tablet Take 1/2 tablet daily if B/P  sustains greater than 532 systolic 90 tablet 3  . diazepam (VALIUM) 10 MG tablet TAKE 1/2 TABLET BY MOUTH EVERY 12 HOURS AS NEEDED -- Office visit needed for further refills 30 tablet 0  . diltiazem (CARDIZEM CD) 120 MG 24 hr capsule Take 1 capsule (120 mg total) by mouth daily. -- Office visit needed for further refills 90 capsule 0  . losartan (COZAAR) 100 MG tablet Take 1 tablet (100 mg total) by mouth daily. 90 tablet 2  . metoprolol succinate (TOPROL-XL) 25 MG 24 hr tablet Take 1 tablet (25 mg total) by mouth 2 (two) times daily. 180 tablet 1  . Multiple Vitamin (MULTIVITAMIN WITH MINERALS) TABS tablet Take 1 tablet by mouth daily.    . Multiple Vitamins-Minerals (ICAPS AREDS FORMULA PO) Take 2 tablets by mouth.    . omega-3 acid ethyl esters (LOVAZA) 1 g capsule Take 1 g by mouth 2 (two) times daily.    . prazosin (MINIPRESS) 1 MG capsule TAKE 2 CAPSULES BY MOUTH EVERY MORNING 1 EVERY EVENING AND 2 AT BEDTIME 450 capsule 3  . psyllium (METAMUCIL) 58.6 % powder Take 1 packet by mouth 3 (three) times daily as needed.    . simvastatin (ZOCOR) 20 MG tablet TAKE 1 TABLET BY MOUTH ONCE DAILY 90 tablet 3  . triamcinolone cream (KENALOG) 0.1 % Apply 1 application topically 2 (two) times daily. 45 g 0  . Vitamin D, Ergocalciferol, (DRISDOL) 50000 units CAPS capsule TAKE 1 CAPSULE BY MOUTH EVERY WEEK 12 capsule  0   No current facility-administered medications on file prior to visit.     Past Medical History:  Diagnosis Date  . Anemia 12/10/2011  . ANXIETY 09/23/2007  . Arthritis   . Atrial tachycardia ([place])   . Breast cancer ([place])   . HYPERLIPIDEMIA 07/03/2009  . HYPERTENSION 07/25/2006  . Macular degeneration   . MVP (mitral valve prolapse)   . Osteoarthritis   . Osteoporosis   . Prediabetes 06/15/2015    Past Surgical History:  Procedure Laterality Date  . BREAST LUMPECTOMY Right 08/08/2011  . BREAST SURGERY  08/08/11   lumpectomy  . CARPAL TUNNEL RELEASE  09/2004  . CATARACT EXTRACTION   04/2000, 03/2002    Social History   Socioeconomic History  . Marital status: Widowed    Spouse name: Not on file  . Number of children: 3  . Years of education: Not on file  . Highest education level: Not on file  Occupational History  . Occupation: Secretary/administrator  Social Needs  . Financial resource strain: Not hard at all  . Food insecurity:    Worry: Never true    Inability: Never true  . Transportation needs:    Medical: No    Non-medical: No  Tobacco Use  . Smoking status: Former Smoker    Last attempt to quit: 07/08/1994    Years since quitting: 23.8  . Smokeless tobacco: Never Used  Substance and Sexual Activity  . Alcohol use: Yes    Comment: 1/2 can of beer with supper occ.  . Drug use: No  . Sexual activity: Not Currently    Partners: Male  Lifestyle  . Physical activity:    Days per week: 5 days    Minutes per session: 40 min  . Stress: Not at all  Relationships  . Social connections:    Talks on phone: More than three times a week    Gets together: More than three times a week    Attends religious service: More than 4 times per year    Active member of club or organization: Yes    Attends meetings of clubs or organizations: More than 4 times per year    Relationship status: Widowed  Other Topics Concern  . Not on file  Social History Narrative  . Not on file    Family History  Problem Relation Age of Onset  . Heart failure Mother   . Stroke Father   . Hypertension Other   . Colon cancer Neg Hx     Review of Systems  Constitutional: Negative for chills and fever.  HENT: Positive for ear discharge.   Respiratory: Negative for cough, shortness of breath and wheezing.   Cardiovascular: Positive for leg swelling (occasionally). Negative for chest pain and palpitations.  Gastrointestinal: Positive for constipation (taking fiber). Negative for abdominal pain and nausea.  Neurological: Negative for light-headedness and headaches.       Objective:    Vitals:   04/28/18 1446  BP: (!) 170/68  Pulse: 68  Resp: 14  Temp: 98.6 F (37 C)  SpO2: 98%   BP Readings from Last 3 Encounters:  04/28/18 (!) 170/68  03/17/18 130/70  01/22/18 (!) 150/52   Wt Readings from Last 3 Encounters:  04/28/18 100 lb (45.4 kg)  03/17/18 99 lb 0.6 oz (44.9 kg)  01/22/18 102 lb (46.3 kg)   Body mass index is 20.2 kg/m.   Physical Exam    Constitutional: Appears well-developed and well-nourished. No distress.  HENT:  Head: Normocephalic and atraumatic.  Neck: Neck supple. No tracheal deviation present. No thyromegaly present.  No cervical lymphadenopathy Cardiovascular: Normal rate, regular rhythm and normal heart sounds.   No murmur heard. No carotid bruit .  No edema Pulmonary/Chest: Effort normal and breath sounds normal. No respiratory distress. No has no wheezes. No rales.  Breasts:  Normal skin b/l breasts, no nipple discharge, no lumps b/l, no axillary LN b/l Abdomen: soft, NT, ND Skin: Skin is warm and dry. Not diaphoretic.  Psychiatric: Normal mood and affect. Behavior is normal.      Assessment & Plan:    See Problem List for Assessment and Plan of chronic medical problems.

## 2018-04-29 DIAGNOSIS — Z853 Personal history of malignant neoplasm of breast: Secondary | ICD-10-CM | POA: Insufficient documentation

## 2018-04-29 NOTE — Assessment & Plan Note (Signed)
No longer following with oncology Breast exam today normal mammo due this December - she will schedule

## 2018-04-29 NOTE — Assessment & Plan Note (Signed)
No cp, palps, sob Follows with cardio Check labs Continue current medicaions

## 2018-04-29 NOTE — Assessment & Plan Note (Signed)
BP elevated here today Will just  Monitor for now - it is variable and I am concerned about hypotension Continue current meds cmp

## 2018-04-29 NOTE — Assessment & Plan Note (Signed)
Check cbc 

## 2018-04-29 NOTE — Assessment & Plan Note (Signed)
Controlled, stable Continue current dose of medication  

## 2018-04-29 NOTE — Assessment & Plan Note (Signed)
Check lipid panel  Continue daily statin Regular exercise and healthy diet encouraged  

## 2018-04-29 NOTE — Assessment & Plan Note (Signed)
Check a1c Low sugar / carb diet Stressed regular exercise   

## 2018-05-04 ENCOUNTER — Other Ambulatory Visit: Payer: Self-pay | Admitting: Oncology

## 2018-05-04 DIAGNOSIS — Z1231 Encounter for screening mammogram for malignant neoplasm of breast: Secondary | ICD-10-CM

## 2018-05-05 ENCOUNTER — Other Ambulatory Visit: Payer: Self-pay | Admitting: Internal Medicine

## 2018-05-11 ENCOUNTER — Other Ambulatory Visit: Payer: Self-pay | Admitting: Internal Medicine

## 2018-05-21 ENCOUNTER — Other Ambulatory Visit: Payer: Self-pay | Admitting: Internal Medicine

## 2018-06-01 ENCOUNTER — Other Ambulatory Visit: Payer: Self-pay | Admitting: Internal Medicine

## 2018-06-01 NOTE — Telephone Encounter (Signed)
Check [place] registry last filled 04/28/2018../lmb  

## 2018-06-29 ENCOUNTER — Other Ambulatory Visit: Payer: Self-pay | Admitting: Internal Medicine

## 2018-06-29 NOTE — Telephone Encounter (Signed)
Copied from [place] [id number]. Topic: Quick Communication - Rx Refill/Question >> Jun 29, 2018  1:50 PM [place], [place] D wrote: Medication: metoprolol succinate (TOPROL-XL) 25 MG 24 hr tablet / Pharmacy instructed pt to call for refill  Has the patient contacted their pharmacy? Yes.   (Agent: If no, request that the patient contact the pharmacy for the refill.) (Agent: If yes, when and what did the pharmacy advise?)  Preferred Pharmacy (with phone number or street name): [id number] [place] [address], [place] [phone] (Phone) [phone] (Fax)    Agent: Please be advised that RX refills may take up to 3 business days. We ask that you follow-up with your pharmacy.

## 2018-06-29 NOTE — Telephone Encounter (Signed)
Check [place] registry last filled 06/02/2018. MD is out of the office pls advise on refill.Marland KitchenJohny Chess

## 2018-06-30 MED ORDER — METOPROLOL SUCCINATE ER 25 MG PO TB24: 25.0000 mg | ORAL_TABLET | Freq: Two times a day (BID) | ORAL | 1 refills | Status: DC

## 2018-07-06 ENCOUNTER — Ambulatory Visit
Admission: RE | Admit: 2018-07-06 | Discharge: 2018-07-06 | Disposition: A | Discharge status: Home / Self Care | Payer: Medicare Other | Source: Ambulatory Visit | Attending: Oncology | Admitting: Oncology

## 2018-07-06 DIAGNOSIS — Z1231 Encounter for screening mammogram for malignant neoplasm of breast: Secondary | ICD-10-CM

## 2018-07-16 ENCOUNTER — Other Ambulatory Visit: Payer: Self-pay | Admitting: Cardiology

## 2018-07-21 ENCOUNTER — Other Ambulatory Visit: Payer: Self-pay | Admitting: Internal Medicine

## 2018-07-29 ENCOUNTER — Other Ambulatory Visit: Payer: Self-pay | Admitting: Internal Medicine

## 2018-07-29 ENCOUNTER — Other Ambulatory Visit: Payer: Self-pay | Admitting: Family

## 2018-07-29 MED ORDER — DIAZEPAM 10 MG PO TABS: ORAL_TABLET | ORAL | 0 refills | Status: DC

## 2018-07-29 NOTE — Telephone Encounter (Signed)
Last refill was 06/29/18 for diazepam Last OV 04/28/18  Next OV is 11/03/18

## 2018-07-29 NOTE — Telephone Encounter (Signed)
Has not had any updated vitamin d blood work in a while did you want her to have that done for more refills?

## 2018-07-29 NOTE — Telephone Encounter (Signed)
diazepam (VALIUM) 10 MG tablet [Pharmacy Med Name: DIAZEPAM 10MG  TABLETS] 30 tablet  07/29/2018   Sig:  TAKE 1/2 TABLET BY MOUTH EVERY 12 HOURS AS NEEDED  DAW:  No   Pharmacy sent to [facility] today because she had last prescribed, but PCP is Dr. Quay Burow.

## 2018-07-29 NOTE — Telephone Encounter (Signed)
Duplicate request. MD just approved and sent to pof.Marland KitchenJohny Chess

## 2018-07-29 NOTE — Telephone Encounter (Signed)
Requested medication (s) are due for refill today: Yes  Requested medication (s) are on the active medication list: Yes  Last refill:  05/05/18  Future visit scheduled: Yes  Notes to clinic:  See request    Requested Prescriptions  Pending Prescriptions Disp Refills   Vitamin D, Ergocalciferol, (DRISDOL) 1.25 MG (50000 UT) CAPS capsule [Pharmacy Med Name: VITAMIN D2 50,000IU (ERGO) CAP RX] 12 capsule 0    Sig: TAKE [place]     Endocrinology:  Vitamins - Vitamin D Supplementation Failed - 07/29/2018 11:54 AM      Failed - 50,000 IU strengths are not delegated      Failed - Phosphate in normal range and within 360 days    No results found for: PHOS       Failed - Vit D in normal range and within 360 days    Vitamin D2 1, 25 (OH)2  Date Value Ref Range Status  12/19/2011 <8 pg/mL Final    Comment:    Vitamin D3, 1,25(OH)2 indicates both endogenous production and supplementation.  Vitamin D2, 1,25(OH)2 is an indicator of exogeous sources, such as diet or supplementation.  Interpretation and therapy are based on measurement of Vitamin D,1,25(OH)2, Total. This test was developed and its performance characteristics have been determined by [facility], [place], [place]. Performance characteristics refer to the analytical performance of the test.   Vitamin D3 1, 25 (OH)2  Date Value Ref Range Status  12/19/2011 22 pg/mL Final   Vitamin D 1, 25 (OH)2 Total  Date Value Ref Range Status  12/19/2011 22 18 - 72 pg/mL Final   VITD  Date Value Ref Range Status  06/09/2014 88.10 30.00 - 100.00 ng/mL Final         Passed - Ca in normal range and within 360 days    Calcium  Date Value Ref Range Status  09/23/2017 9.4 8.4 - 10.5 mg/dL Final  09/02/2016 9.0 8.4 - 10.4 mg/dL Final         Passed - Valid encounter within last 12 months    Recent Outpatient Visits          3 months ago Essential hypertension   [place], Stacy J, MD   4 months ago Herpes zoster without complication   [place], Marvis Repress, FNP   10 months ago Coronary artery disease involving native coronary artery of native heart without angina pectoris   [place], Claudina Lick, MD   1 year ago Essential hypertension   [place], Stacy J, MD   1 year ago Medicare annual wellness visit, subsequent   [place], MD      Future Appointments            In 3 months Burns, Claudina Lick, MD [place], [facility]          diazepam (VALIUM) 10 MG tablet 30 tablet 0    Sig: Take 1/2 tablet by mouth every 12 hours as needed.     Not Delegated - Psychiatry:  Anxiolytics/Hypnotics Failed - 07/29/2018 11:54 AM      Failed - This refill cannot be delegated      Failed - Urine Drug Screen completed in last 360 days.      Passed - Valid encounter within last 6 months    Recent Outpatient  Visits          3 months ago Essential hypertension   [place], Claudina Lick, MD   4 months ago Herpes zoster without complication   [place], Marvis Repress, FNP   10 months ago Coronary artery disease involving native coronary artery of native heart without angina pectoris   [place], Claudina Lick, MD   1 year ago Essential hypertension   [place], Stacy J, MD   1 year ago Medicare annual wellness visit, subsequent   [place], MD      Future Appointments            In 3 months Burns, Claudina Lick, MD [place], [facility]

## 2018-07-29 NOTE — Telephone Encounter (Signed)
[place], Lumin L 4 minutes ago (11:35 AM)      diazepam (VALIUM) 10 MG tablet [Pharmacy Med Name: DIAZEPAM 10MG  TABLETS] 30 tablet  07/29/2018   Sig:  TAKE 1/2 TABLET BY MOUTH EVERY 12 HOURS AS NEEDED  DAW:  No   Pharmacy sent to [facility] today because she had last prescribed, but PCP is Dr. Quay Burow.      Documentation

## 2018-08-07 ENCOUNTER — Other Ambulatory Visit: Payer: Self-pay | Admitting: Internal Medicine

## 2018-08-27 ENCOUNTER — Other Ambulatory Visit: Payer: Self-pay | Admitting: Internal Medicine

## 2018-08-27 NOTE — Telephone Encounter (Signed)
MD is out of the office this week. Check [place] registry last filled 07/29/2018. Pls advise...Johny Chess

## 2018-10-02 ENCOUNTER — Telehealth: Payer: Self-pay | Admitting: Internal Medicine

## 2018-10-02 NOTE — Telephone Encounter (Signed)
Patient called requesting to speak to the office manager

## 2018-10-03 ENCOUNTER — Other Ambulatory Visit: Payer: Self-pay | Admitting: Internal Medicine

## 2018-10-03 NOTE — Telephone Encounter (Signed)
Can pt have a refill on this medication  

## 2018-10-05 ENCOUNTER — Telehealth: Payer: Self-pay | Admitting: Internal Medicine

## 2018-10-05 ENCOUNTER — Other Ambulatory Visit: Payer: Self-pay | Admitting: Internal Medicine

## 2018-10-05 MED ORDER — DIAZEPAM 10 MG PO TABS: ORAL_TABLET | ORAL | 0 refills | Status: DC

## 2018-10-05 NOTE — Telephone Encounter (Signed)
Pt left voicemail message on 10/05/18 at 9:14 am to request refills on Diazepam and Diltiazem. Medications noted to be filled on 10/05/18

## 2018-10-05 NOTE — Telephone Encounter (Signed)
Last refill was 08/27/18 Last OV 04/28/2018 Next OV 11/03/18

## 2018-10-08 NOTE — Telephone Encounter (Signed)
Spoke with patient, she is confused at times and does not remember why she called. I also spoke with her daughter, who confirms she is not in need of anything at this time.

## 2018-10-19 ENCOUNTER — Telehealth: Payer: Self-pay | Admitting: Internal Medicine

## 2018-10-19 NOTE — Telephone Encounter (Signed)
Pt would like to know she can just get blood work done on her appt  On the 28th without having the appointment. Please advise

## 2018-10-20 NOTE — Telephone Encounter (Signed)
Appointment for 4/28 is rescheduled.

## 2018-10-21 ENCOUNTER — Other Ambulatory Visit: Payer: Self-pay | Admitting: Internal Medicine

## 2018-10-23 ENCOUNTER — Telehealth: Payer: Self-pay

## 2018-10-23 NOTE — Telephone Encounter (Signed)
Copied from [place] [phone]. Topic: General - Other >> Oct 22, 2018  4:40 PM Keene Breath wrote: Reason for CRM: Patient called to ask the doctor if this is a good time for her to get the shingrix vaccine.  Patient stated that she has had the shingles before.  Please advise and call patient back at [phone] >> Oct 22, 2018  4:46 PM Morey Hummingbird wrote: Please advise, patient has memory issues   Advised patient and daughter, we are not currently administering shingles vaccines, this is live vaccine and per dr burns, would be safer and more appropriate to wait--until after COVID19 restrictions are lifted

## 2018-10-25 ENCOUNTER — Other Ambulatory Visit: Payer: Self-pay | Admitting: Internal Medicine

## 2018-11-03 ENCOUNTER — Ambulatory Visit: Payer: Medicare Other | Admitting: Internal Medicine

## 2018-11-04 ENCOUNTER — Other Ambulatory Visit: Payer: Self-pay | Admitting: Internal Medicine

## 2018-11-04 NOTE — Telephone Encounter (Signed)
Last refill was 10/05/18 Last OV 04/28/18 Next OV 11/25/18

## 2018-11-10 ENCOUNTER — Ambulatory Visit (INDEPENDENT_AMBULATORY_CARE_PROVIDER_SITE_OTHER): Payer: Medicare Other | Admitting: Internal Medicine

## 2018-11-10 ENCOUNTER — Encounter: Payer: Self-pay | Admitting: Internal Medicine

## 2018-11-10 ENCOUNTER — Other Ambulatory Visit (INDEPENDENT_AMBULATORY_CARE_PROVIDER_SITE_OTHER): Payer: Medicare Other

## 2018-11-10 VITALS — BP 168/62 | HR 65 | Temp 98.3°F | Resp 16 | Ht 59.0 in | Wt 99.5 lb

## 2018-11-10 DIAGNOSIS — I10 Essential (primary) hypertension: Secondary | ICD-10-CM | POA: Diagnosis not present

## 2018-11-10 DIAGNOSIS — R7303 Prediabetes: Secondary | ICD-10-CM

## 2018-11-10 DIAGNOSIS — I2510 Atherosclerotic heart disease of native coronary artery without angina pectoris: Secondary | ICD-10-CM

## 2018-11-10 DIAGNOSIS — D649 Anemia, unspecified: Secondary | ICD-10-CM

## 2018-11-10 DIAGNOSIS — E785 Hyperlipidemia, unspecified: Secondary | ICD-10-CM | POA: Diagnosis not present

## 2018-11-10 DIAGNOSIS — R21 Rash and other nonspecific skin eruption: Secondary | ICD-10-CM

## 2018-11-10 LAB — COMPREHENSIVE METABOLIC PANEL
ALT: 12 U/L (ref 0–35)
AST: 16 U/L (ref 0–37)
Albumin: 3.8 g/dL (ref 3.5–5.2)
Alkaline Phosphatase: 67 U/L (ref 39–117)
BUN: 11 mg/dL (ref 6–23)
CO2: 31 mEq/L (ref 19–32)
Calcium: 8.9 mg/dL (ref 8.4–10.5)
Chloride: 98 mEq/L (ref 96–112)
Creatinine, Ser: 0.81 mg/dL (ref 0.40–1.20)
GFR: 66.36 mL/min (ref >60.00)
Glucose, Bld: 120 mg/dL — ABNORMAL HIGH (ref 70–99)
Potassium: 4 mEq/L (ref 3.5–5.1)
Sodium: 134 mEq/L — ABNORMAL LOW (ref 135–145)
Total Bilirubin: 0.4 mg/dL (ref 0.2–1.2)
Total Protein: 6.7 g/dL (ref 6.0–8.3)

## 2018-11-10 LAB — CBC WITH DIFFERENTIAL/PLATELET
Basophils Absolute: 0.1 10*3/uL (ref 0.0–0.1)
Basophils Relative: 1.1 % (ref 0.0–3.0)
Eosinophils Absolute: 0.1 10*3/uL (ref 0.0–0.7)
Eosinophils Relative: 2.2 % (ref 0.0–5.0)
HCT: 31.6 % — ABNORMAL LOW (ref 36.0–46.0)
Hemoglobin: 10.8 g/dL — ABNORMAL LOW (ref 12.0–15.0)
Lymphocytes Relative: 24.8 % (ref 12.0–46.0)
Lymphs Abs: 1.7 10*3/uL (ref 0.7–4.0)
MCHC: 34.3 g/dL (ref 30.0–36.0)
MCV: 92.7 fl (ref 78.0–100.0)
Monocytes Absolute: 0.9 10*3/uL (ref 0.1–1.0)
Monocytes Relative: 14 % — ABNORMAL HIGH (ref 3.0–12.0)
Neutro Abs: 3.9 10*3/uL (ref 1.4–7.7)
Neutrophils Relative %: 57.9 % (ref 43.0–77.0)
Platelets: 193 10*3/uL (ref 150.0–400.0)
RBC: 3.41 Mil/uL — ABNORMAL LOW (ref 3.87–5.11)
RDW: 12.9 % (ref 11.5–15.5)
WBC: 6.8 10*3/uL (ref 4.0–10.5)

## 2018-11-10 LAB — LIPID PANEL
Cholesterol: 98 mg/dL (ref 0–200)
HDL: 51.5 mg/dL (ref >39.00)
LDL Cholesterol: 31 mg/dL (ref 0–99)
NonHDL: 46.53
Total CHOL/HDL Ratio: 2
Triglycerides: 80 mg/dL (ref 0.0–149.0)
VLDL: 16 mg/dL (ref 0.0–40.0)

## 2018-11-10 LAB — HEMOGLOBIN A1C: Hgb A1c MFr Bld: 5.9 % (ref 4.6–6.5)

## 2018-11-10 NOTE — Assessment & Plan Note (Signed)
She currently has 2 skin lesions-1 in the left side of her nose and one left temple-they are nontender and do not itch No blood red papules-no blisters and does not seem to resemble shingles or anything else concerning Advised her just to monitor for now No treatment is needed She will call with any questions or concerns

## 2018-11-10 NOTE — Progress Notes (Signed)
Subjective:    Patient ID: Gabrielle Warren, female    DOB: [birth year], 85 y.o.   MRN: [id number]  HPI The patient is here for an acute visit.  She has noticed new skin lesion over the past week.  The first one popped up on her lateral left eyebrow, the next one was on her left cheek one week ago and then she has one on her nose.  She also thinks there is one on her left temple.  The first to have disappeared on their own.  She still has the other 2-left side of the nose and left temple.  She denies any pain or itching. She denies other skin issues or lesions elsewhere.  She denies any fevers or cold symptoms.  She denies any other potential causes for the skin issues..    Hypertension: She is taking her medication daily. She is compliant with a low sodium diet.  She denies chest pain, palpitations, edema, shortness of breath and regular headaches. She does monitor her blood pressure at home - typically in the 140's/60's.      Medications and allergies reviewed with patient and updated if appropriate.  Patient Active Problem List   Diagnosis Date Noted  . Rash and nonspecific skin eruption 11/10/2018  . History of breast cancer 04/29/2018  . Urinary leakage 04/28/2018  . Malignant neoplasm of central portion of right breast in female, estrogen receptor positive ([place]) 09/02/2016  . Constipation 12/05/2015  . Macular degeneration 06/15/2015  . Back pain 06/15/2015  . Memory difficulties 06/15/2015  . Prediabetes 06/15/2015  . Breast cancer, right breast ([place]) 09/05/2014  . Allergic rhinitis 02/01/2014  . Anemia 09/22/2013  . Invasive ductal carcinoma of breast, stage 1 ([place]) 07/18/2011  . CAD (coronary artery disease) 04/26/2011  . Hyperlipidemia 07/03/2009  . BRUIT 07/21/2008  . Anxiety 09/23/2007  . OSTEOARTHRITIS 09/23/2007  . Osteoporosis 09/23/2007  . Essential hypertension 07/25/2006    Current Outpatient Medications on File Prior to Visit  Medication Sig  Dispense Refill  . aspirin EC 81 MG tablet Take 81 mg by mouth at bedtime.    . Calcium Carb-Cholecalciferol [id number] MG-UNIT CAPS Take 1 capsule by mouth 2 (two) times daily.    Marland Kitchen CARTIA XT 120 MG 24 hr capsule TAKE 1 CAPSULE BY MOUTH EVERY DAY**OFFICE VISIT NEEDED FOR FURTHER REFILLS 90 capsule 0  . cloNIDine (CATAPRES) 0.1 MG tablet Take 1/2 tablet daily if B/P sustains greater than 244 systolic 90 tablet 3  . diazepam (VALIUM) 10 MG tablet TAKE 1/2 TABLET BY MOUTH EVERY 12 HOURS AS NEEDED 30 tablet 0  . losartan (COZAAR) 100 MG tablet TAKE 1 TABLET BY MOUTH ONCE DAILY 90 tablet 1  . metoprolol succinate (TOPROL-XL) 25 MG 24 hr tablet TAKE 1 TABLET(25 MG) BY MOUTH TWICE DAILY 180 tablet 3  . Multiple Vitamin (MULTIVITAMIN WITH MINERALS) TABS tablet Take 1 tablet by mouth daily.    . Multiple Vitamins-Minerals (ICAPS AREDS FORMULA PO) Take 2 tablets by mouth.    . omega-3 acid ethyl esters (LOVAZA) 1 g capsule Take 1 g by mouth 2 (two) times daily.    . prazosin (MINIPRESS) 1 MG capsule TAKE 2 CAPSULES BY MOUTH EVERY MORNING 1 EVERY EVENING AND 2 AT BEDTIME 450 capsule 1  . psyllium (METAMUCIL) 58.6 % powder Take 1 packet by mouth 3 (three) times daily as needed.    . simvastatin (ZOCOR) 20 MG tablet TAKE 1 TABLET BY MOUTH ONCE DAILY 90 tablet 0  .  triamcinolone cream (KENALOG) 0.1 % Apply 1 application topically 2 (two) times daily. 45 g 0  . Vitamin D, Ergocalciferol, (DRISDOL) 1.25 MG (50000 UT) CAPS capsule TAKE 1 CAPSULE BY MOUTH EVERY WEEK 12 capsule 0   No current facility-administered medications on file prior to visit.     Past Medical History:  Diagnosis Date  . Anemia 12/10/2011  . ANXIETY 09/23/2007  . Arthritis   . Atrial tachycardia ([place])   . Breast cancer ([place])   . HYPERLIPIDEMIA 07/03/2009  . HYPERTENSION 07/25/2006  . Macular degeneration   . MVP (mitral valve prolapse)   . Osteoarthritis   . Osteoporosis   . Prediabetes 06/15/2015    Past Surgical History:   Procedure Laterality Date  . BREAST LUMPECTOMY Right 08/08/2011  . BREAST SURGERY  08/08/11   lumpectomy  . CARPAL TUNNEL RELEASE  09/2004  . CATARACT EXTRACTION  04/2000, 03/2002    Social History   Socioeconomic History  . Marital status: Widowed    Spouse name: Not on file  . Number of children: 3  . Years of education: Not on file  . Highest education level: Not on file  Occupational History  . Occupation: Secretary/administrator  Social Needs  . Financial resource strain: Not hard at all  . Food insecurity:    Worry: Never true    Inability: Never true  . Transportation needs:    Medical: No    Non-medical: No  Tobacco Use  . Smoking status: Former Smoker    Last attempt to quit: 07/08/1994    Years since quitting: 24.3  . Smokeless tobacco: Never Used  Substance and Sexual Activity  . Alcohol use: Yes    Comment: 1/2 can of beer with supper occ.  . Drug use: No  . Sexual activity: Not Currently    Partners: Male  Lifestyle  . Physical activity:    Days per week: 5 days    Minutes per session: 40 min  . Stress: Not at all  Relationships  . Social connections:    Talks on phone: More than three times a week    Gets together: More than three times a week    Attends religious service: More than 4 times per year    Active member of club or organization: Yes    Attends meetings of clubs or organizations: More than 4 times per year    Relationship status: Widowed  Other Topics Concern  . Not on file  Social History Narrative  . Not on file    Family History  Problem Relation Age of Onset  . Heart failure Mother   . Stroke Father   . Hypertension Other   . Colon cancer Neg Hx     Review of Systems  Constitutional: Negative for chills and fever.  Eyes: Negative for pain, redness, itching and visual disturbance.  Respiratory: Negative for cough, shortness of breath and wheezing.   Cardiovascular: Negative for chest pain, palpitations and leg swelling.  Neurological:  Negative for headaches.       Objective:   Vitals:   11/10/18 1524 11/10/18 1549  BP: (!) 172/68 (!) 168/62  Pulse: 65   Resp: 16   Temp: 98.3 F (36.8 C)   SpO2: 98%    BP Readings from Last 3 Encounters:  11/10/18 (!) 168/62  04/28/18 (!) 170/68  03/17/18 130/70   Wt Readings from Last 3 Encounters:  11/10/18 99 lb 8 oz (45.1 kg)  04/28/18 100 lb (45.4 kg)  03/17/18 99 lb 0.6 oz (44.9 kg)   Body mass index is 20.1 kg/m.   Physical Exam Constitutional:      Appearance: Normal appearance.  Cardiovascular:     Rate and Rhythm: Normal rate and regular rhythm.  Pulmonary:     Effort: Pulmonary effort is normal. No respiratory distress.     Breath sounds: Normal breath sounds. No wheezing or rales.  Skin:    General: Skin is warm and dry.     Comments: Two red papules - on on left side of distal nose and one in left temple - nontender, non fluctuant, no blisters, no open wounds, no other similar lesions on her face or exposed skin  Neurological:     Mental Status: She is alert.            Assessment & Plan:    See Problem List for Assessment and Plan of chronic medical problems.

## 2018-11-10 NOTE — Assessment & Plan Note (Signed)
Blood pressure elevated here today and is typically elevated here She states it is well controlled at home-typically in the 140s/60s May be an element of whitecoat hypertension-she does get anxious when she comes here Continue to monitor at home Continue current medications given her age

## 2018-11-10 NOTE — Patient Instructions (Addendum)
Monitor your lesions closely.  If there are changes please call.  No treatment at this time is needed.     Have blood work done today.   We will call you with the results.

## 2018-11-11 ENCOUNTER — Other Ambulatory Visit: Payer: Self-pay | Admitting: Internal Medicine

## 2018-11-25 ENCOUNTER — Ambulatory Visit: Payer: Medicare Other | Admitting: Internal Medicine

## 2018-11-27 ENCOUNTER — Other Ambulatory Visit: Payer: Self-pay | Admitting: Internal Medicine

## 2018-11-27 MED ORDER — DIAZEPAM 10 MG PO TABS: ORAL_TABLET | ORAL | 0 refills | Status: DC

## 2018-11-27 NOTE — Telephone Encounter (Signed)
Last routine OV was 04/28/18 Next OV 12/07/18 Last refill was 11/05/18

## 2018-11-27 NOTE — Telephone Encounter (Signed)
Copied from [place] [id number]. Topic: Quick Communication - Rx Refill/Question >> Nov 27, 2018  2:06 PM Robina Ade, Helene Kelp D wrote: Medication: diazepam (VALIUM) 10 MG tablet  Has the patient contacted their pharmacy? yes  (Agent: If no, request that the patient contact the pharmacy for the refill.) (Agent: If yes, when and what did the pharmacy advise?)  Preferred Pharmacy (with phone number or street name): [facility] [phone] - [place], [place]: Please be advised that RX refills may take up to 3 business days. We ask that you follow-up with your pharmacy.

## 2018-12-07 ENCOUNTER — Ambulatory Visit: Payer: Medicare Other | Admitting: Internal Medicine

## 2018-12-28 ENCOUNTER — Other Ambulatory Visit: Payer: Self-pay | Admitting: Internal Medicine

## 2018-12-29 ENCOUNTER — Other Ambulatory Visit: Payer: Self-pay | Admitting: Internal Medicine

## 2018-12-30 ENCOUNTER — Telehealth: Payer: Self-pay | Admitting: Internal Medicine

## 2018-12-30 ENCOUNTER — Other Ambulatory Visit: Payer: Self-pay | Admitting: Internal Medicine

## 2018-12-30 MED ORDER — DIAZEPAM 10 MG PO TABS: ORAL_TABLET | ORAL | 0 refills | Status: DC

## 2018-12-30 NOTE — Telephone Encounter (Signed)
Last refill was 11/28/18 Last OV 04/28/2018 Next OV 01/05/19

## 2018-12-30 NOTE — Telephone Encounter (Signed)
Pt is calling and needs a refill on diazepam. [facility] [address]

## 2018-12-31 ENCOUNTER — Other Ambulatory Visit: Payer: Self-pay | Admitting: Internal Medicine

## 2019-01-04 NOTE — Progress Notes (Signed)
Virtual Visit via Video Note  I connected with Gabrielle Warren on 01/05/19 at  2:30 PM EDT by a video enabled telemedicine application and verified that I am speaking with the correct person using two identifiers.   I discussed the limitations of evaluation and management by telemedicine and the availability of in person appointments. The patient expressed understanding and agreed to proceed.  The patient is currently at home and I am in the office.  Her daughter is with her.   No referring provider.    History of Present Illness: She is here for follow up of her chronic medical conditions.   She is exercising regularly - walking daily.     CAD, Hypertension: She is taking her medication daily. She is compliant with a low sodium diet.  She denies chest pain, palpitations, shortness of breath and regular headaches. She does monitor her blood pressure at home.    Anxiety: She is taking her medication daily as prescribed. She denies any side effects from the medication. She feels her anxiety is well controlled and she is happy with her current dose of medication.   Hyperlipidemia: She is taking her medication daily. She is compliant with a low fat/cholesterol diet.    Prediabetes:  She is compliant with a low sugar/carbohydrate diet.  She is exercising regularly.  She is having decreased hearing and would like to have it evaluated.     Review of Systems  Constitutional: Negative for chills and fever.  Respiratory: Negative for cough, shortness of breath and wheezing.   Cardiovascular: Positive for leg swelling (occ, mild). Negative for chest pain and palpitations.  Gastrointestinal: Positive for constipation (controlled). Negative for abdominal pain and nausea.  Neurological: Negative for dizziness and headaches.  Psychiatric/Behavioral: Negative for depression. The patient is not nervous/anxious.      Social History   Socioeconomic History  . Marital status: Widowed   Spouse name: Not on file  . Number of children: 3  . Years of education: Not on file  . Highest education level: Not on file  Occupational History  . Occupation: Secretary/administrator  Social Needs  . Financial resource strain: Not hard at all  . Food insecurity    Worry: Never true    Inability: Never true  . Transportation needs    Medical: No    Non-medical: No  Tobacco Use  . Smoking status: Former Smoker    Quit date: 07/08/1994    Years since quitting: 24.5  . Smokeless tobacco: Never Used  Substance and Sexual Activity  . Alcohol use: Yes    Comment: 1/2 can of beer with supper occ.  . Drug use: No  . Sexual activity: Not Currently    Partners: Male  Lifestyle  . Physical activity    Days per week: 5 days    Minutes per session: 40 min  . Stress: Not at all  Relationships  . Social connections    Talks on phone: More than three times a week    Gets together: More than three times a week    Attends religious service: More than 4 times per year    Active member of club or organization: Yes    Attends meetings of clubs or organizations: More than 4 times per year    Relationship status: Widowed  Other Topics Concern  . Not on file  Social History Narrative  . Not on file     Observations/Objective: Appears well in NAD Normal mood and affect  BP at home today 138/72  Assessment and Plan:  See Problem List for Assessment and Plan of chronic medical problems.   Follow Up Instructions:    I discussed the assessment and treatment plan with the patient. The patient was provided an opportunity to ask questions and all were answered. The patient agreed with the plan and demonstrated an understanding of the instructions.   The patient was advised to call back or seek an in-person evaluation if the symptoms worsen or if the condition fails to improve as anticipated.  FU in 6 months  Binnie Rail, MD

## 2019-01-05 ENCOUNTER — Ambulatory Visit (INDEPENDENT_AMBULATORY_CARE_PROVIDER_SITE_OTHER): Payer: Medicare Other | Admitting: Internal Medicine

## 2019-01-05 ENCOUNTER — Encounter: Payer: Self-pay | Admitting: Internal Medicine

## 2019-01-05 ENCOUNTER — Ambulatory Visit: Payer: Medicare Other | Admitting: Internal Medicine

## 2019-01-05 DIAGNOSIS — I10 Essential (primary) hypertension: Secondary | ICD-10-CM

## 2019-01-05 DIAGNOSIS — H9190 Unspecified hearing loss, unspecified ear: Secondary | ICD-10-CM

## 2019-01-05 DIAGNOSIS — E785 Hyperlipidemia, unspecified: Secondary | ICD-10-CM

## 2019-01-05 DIAGNOSIS — I2510 Atherosclerotic heart disease of native coronary artery without angina pectoris: Secondary | ICD-10-CM

## 2019-01-05 DIAGNOSIS — F419 Anxiety disorder, unspecified: Secondary | ICD-10-CM

## 2019-01-05 DIAGNOSIS — R7303 Prediabetes: Secondary | ICD-10-CM

## 2019-01-05 MED ORDER — METOPROLOL SUCCINATE ER 25 MG PO TB24: ORAL_TABLET | ORAL | 1 refills | Status: DC

## 2019-01-05 NOTE — Assessment & Plan Note (Signed)
Continue statin. 

## 2019-01-05 NOTE — Assessment & Plan Note (Signed)
Low sugar / carb diet Stressed regular exercise    

## 2019-01-05 NOTE — Assessment & Plan Note (Signed)
Controlled, stable Continue current dose of medication  

## 2019-01-05 NOTE — Assessment & Plan Note (Signed)
Referred to audiology

## 2019-01-05 NOTE — Assessment & Plan Note (Signed)
Asymptomatic continue current medications

## 2019-01-05 NOTE — Assessment & Plan Note (Signed)
BP well controlled at home Current regimen effective and well tolerated Continue current medications at current doses

## 2019-01-07 ENCOUNTER — Other Ambulatory Visit: Payer: Self-pay | Admitting: Internal Medicine

## 2019-01-14 ENCOUNTER — Telehealth: Payer: Self-pay

## 2019-01-14 NOTE — Telephone Encounter (Signed)
Lab work mailed.

## 2019-01-14 NOTE — Telephone Encounter (Signed)
Copied from [place] [id number]. Topic: General - Other >> Jan 14, 2019  1:31 PM Lennox Solders wrote: Reason for CRM: pt would like a copy of her most recent blood work to be mail to home address

## 2019-01-25 ENCOUNTER — Other Ambulatory Visit: Payer: Self-pay | Admitting: Internal Medicine

## 2019-02-01 ENCOUNTER — Other Ambulatory Visit: Payer: Self-pay | Admitting: Internal Medicine

## 2019-02-01 NOTE — Telephone Encounter (Signed)
Last refill 12/30/18 Last OV 01/05/19 Next OV NA

## 2019-02-14 ENCOUNTER — Other Ambulatory Visit: Payer: Self-pay | Admitting: Internal Medicine

## 2019-02-15 ENCOUNTER — Ambulatory Visit: Payer: Medicare Other | Admitting: Cardiology

## 2019-02-16 NOTE — Progress Notes (Signed)
HPI The patient presents for evaluation of hypertension.  Follow-up since I last saw her she is done well.  She had a bout of shingles.  She also happens to have an ulcerative lesion on her upper lip.  She is not describing any new cardiovascular symptoms however.  She says her blood pressures actually been well controlled and she has not had to take any of the extra clonidine.  She is not walking as much as she was given the global pandemic but she still tries to stay active.  She denies any cardiovascular symptoms such as neck or arm discomfort.  She has no palpitations, presyncope or syncope.  Said no weight gain or edema.  Allergies  Allergen Reactions  . Fosamax [Alendronate Sodium] Other (See Comments)    Reaction: Bleeding gums    Current Outpatient Medications  Medication Sig Dispense Refill  . aspirin EC 81 MG tablet Take 81 mg by mouth at bedtime.    . Calcium Carb-Cholecalciferol [phone] MG-UNIT CAPS Take 1 capsule by mouth 2 (two) times daily.    . cloNIDine (CATAPRES) 0.1 MG tablet Take 1/2 tablet daily if B/P sustains greater than 161 systolic 90 tablet 3  . diazepam (VALIUM) 10 MG tablet TAKE 1/2 TABLET BY MOUTH EVERY 12 HOURS AS NEEDED 30 tablet 0  . diltiazem (CARDIZEM CD) 120 MG 24 hr capsule TAKE 1 CAPSULE(120 MG) BY MOUTH DAILY 90 capsule 0  . losartan (COZAAR) 100 MG tablet TAKE 1 TABLET BY MOUTH ONCE DAILY 90 tablet 1  . metoprolol succinate (TOPROL-XL) 25 MG 24 hr tablet Take 1 tablet (25 mg) by mouth twice daily. 180 tablet 1  . Multiple Vitamin (MULTIVITAMIN WITH MINERALS) TABS tablet Take 1 tablet by mouth daily.    . Multiple Vitamins-Minerals (ICAPS AREDS FORMULA PO) Take 2 tablets by mouth.    . omega-3 acid ethyl esters (LOVAZA) 1 g capsule Take 1 g by mouth 2 (two) times daily.    . prazosin (MINIPRESS) 1 MG capsule TAKE 2 CAPSULES BY MOUTH EVERY MORNING, 1 EVERY EVENING, AND 2 AT BEDTIME 450 capsule 0  . psyllium (METAMUCIL) 58.6 % powder Take 1 packet by  mouth 3 (three) times daily as needed.    . simvastatin (ZOCOR) 20 MG tablet TAKE 1 TABLET BY MOUTH EVERY DAY 90 tablet 0  . triamcinolone cream (KENALOG) 0.1 % Apply 1 application topically 2 (two) times daily. 45 g 0  . Vitamin D, Ergocalciferol, (DRISDOL) 1.25 MG (50000 UT) CAPS capsule TAKE 1 CAPSULE BY MOUTH EVERY WEEK 12 capsule 0   No current facility-administered medications for this visit.     Past Medical History:  Diagnosis Date  . Anemia 12/10/2011  . ANXIETY 09/23/2007  . Arthritis   . Atrial tachycardia ([place])   . Breast cancer ([place])   . HYPERLIPIDEMIA 07/03/2009  . HYPERTENSION 07/25/2006  . Macular degeneration   . MVP (mitral valve prolapse)   . Osteoarthritis   . Osteoporosis   . Prediabetes 06/15/2015    Past Surgical History:  Procedure Laterality Date  . BREAST LUMPECTOMY Right 08/08/2011  . BREAST SURGERY  08/08/11   lumpectomy  . CARPAL TUNNEL RELEASE  09/2004  . CATARACT EXTRACTION  04/2000, 03/2002   ROS    As stated in the HPI and negative for all other systems.  PHYSICAL EXAM BP (!) 148/64   Pulse 62   Temp 98.2 F (36.8 C) (Temporal)   Ht 4\' 11"  (1.499 m)   Wt 102  lb 6.4 oz (46.4 kg)   SpO2 95%   BMI 20.68 kg/m   GENERAL:  Well appearing NECK:  No jugular venous distention, waveform within normal limits, carotid upstroke brisk and symmetric, no bruits, no thyromegaly LUNGS:  Clear to auscultation bilaterally CHEST:  Unremarkable HEART:  PMI not displaced or sustained,S1 and S2 within normal limits, no S3, no S4, no clicks, no rubs, 2 out of 6 apical systolic murmur nonradiating, no diastolic murmurs ABD:  Flat, positive bowel sounds normal in frequency in pitch, no bruits, no rebound, no guarding, no midline pulsatile mass, no hepatomegaly, no splenomegaly EXT:  2 plus pulses throughout, no edema, no cyanosis no clubbing SKIN:  Healed ulcer on the upper lip.     EKG:  Sinus rhythm, rate 62, axis within normal limits, intervals within normal  limits, sinus arrhythmia. Nonspecific ST T wave flattening.  02/17/2019   ASSESSMENT AND PLAN  HYPERTENSION -  Patient's blood pressure is slightly elevated today but this is much better than it had been previously.  No change in therapy.   MURMUR -   She was going to have an echo.  However, she wants to be very conservative in this.  I suspect is aortic sclerosis.  No change in therapy.  SKIN LESION -  I suggested she follow-up with Dr. Quay Burow about this.

## 2019-02-17 ENCOUNTER — Ambulatory Visit: Payer: Medicare Other | Admitting: Internal Medicine

## 2019-02-17 ENCOUNTER — Ambulatory Visit (INDEPENDENT_AMBULATORY_CARE_PROVIDER_SITE_OTHER): Payer: Medicare Other | Admitting: Cardiology

## 2019-02-17 ENCOUNTER — Other Ambulatory Visit: Payer: Self-pay

## 2019-02-17 ENCOUNTER — Encounter: Payer: Self-pay | Admitting: Cardiology

## 2019-02-17 VITALS — BP 148/64 | HR 62 | Temp 98.2°F | Ht 59.0 in | Wt 102.4 lb

## 2019-02-17 DIAGNOSIS — R011 Cardiac murmur, unspecified: Secondary | ICD-10-CM

## 2019-02-17 DIAGNOSIS — I10 Essential (primary) hypertension: Secondary | ICD-10-CM

## 2019-02-17 DIAGNOSIS — I2510 Atherosclerotic heart disease of native coronary artery without angina pectoris: Secondary | ICD-10-CM

## 2019-02-17 NOTE — Patient Instructions (Signed)
Medication Instructions:  Your physician recommends that you continue on your current medications as directed. Please refer to the Current Medication list given to you today.  If you need a refill on your cardiac medications before your next appointment, please call your pharmacy.   Lab work: NONE  Testing/Procedures: NONE  Follow-Up: At [facility], you and your health needs are our priority.  As part of our continuing mission to provide you with exceptional heart care, we have created designated Provider Care Teams.  These Care Teams include your primary Cardiologist (physician) and Advanced Practice Providers (APPs -  Physician Assistants and Nurse Practitioners) who all work together to provide you with the care you need, when you need it. You will need a follow up appointment in 6 months.  Please call our office 2 months in advance to schedule this appointment.  You may see DR HOCHREIN  or one of the following Advanced Practice Providers on your designated Care Team:   Rhonda Barrett, PA-C . Kathryn Lawrence, DNP, ANP   

## 2019-02-17 NOTE — Addendum Note (Signed)
Addended by: Alvina Filbert B on: 02/17/2019 06:15 PM   Modules accepted: Orders

## 2019-02-22 ENCOUNTER — Telehealth: Payer: Self-pay

## 2019-02-22 DIAGNOSIS — L989 Disorder of the skin and subcutaneous tissue, unspecified: Secondary | ICD-10-CM

## 2019-02-22 NOTE — Telephone Encounter (Signed)
Referral ordered.  Unfortunately it does take a while to get in with dermatology and we do not have any influence over how long it takes.  They will call her.

## 2019-02-22 NOTE — Telephone Encounter (Signed)
Copied from [place] [id number]. Topic: Referral - Request for Referral >> Feb 22, 2019  2:26 PM Erick Blinks wrote: Has patient seen PCP for this complaint? Yes.   *If NO, is insurance requiring patient see PCP for this issue before PCP can refer them? Referral for which specialty: Dermatology  Preferred provider/office: Highest recommended  Reason for referral: Nose patch, size of a postage stamp, 3 weeks

## 2019-02-23 NOTE — Telephone Encounter (Signed)
Pt aware of response.  

## 2019-02-27 ENCOUNTER — Other Ambulatory Visit: Payer: Self-pay | Admitting: Internal Medicine

## 2019-03-01 NOTE — Telephone Encounter (Signed)
Check [place] registry last filled 02/01/2019.Marland KitchenChryl Heck

## 2019-03-02 NOTE — Telephone Encounter (Signed)
FYI: I spoke to pt regarding a scheduled appt at [facility] and she stated the spot cleared up on her nose and wanted to cancel the referral.

## 2019-03-24 ENCOUNTER — Other Ambulatory Visit: Payer: Self-pay | Admitting: Internal Medicine

## 2019-03-27 ENCOUNTER — Other Ambulatory Visit: Payer: Self-pay | Admitting: Internal Medicine

## 2019-03-29 NOTE — Progress Notes (Signed)
Subjective:    Patient ID: Gabrielle Warren, female    DOB: [birth year], 85 y.o.   MRN: [id number]  HPI The patient is here for an acute visit.   It started one morning when she woke up. She had a little pimple in her nose.  She tried to squeeze it but she did not get anything out of it.  Each morning it got bigger.  It looked like a Animator.  It was reddish, brown.  She felt like her nose was going to have droplets come out of it but it never did - it was dry.  It lasted two weeks.  This occurred earlier this month.  She denies previous episodes.  She denies other symptoms. She denies using any new products.  She was worried because of COVID-19 going around.       Medications and allergies reviewed with patient and updated if appropriate.  Patient Active Problem List   Diagnosis Date Noted  . Decreased hearing 01/05/2019  . Rash and nonspecific skin eruption 11/10/2018  . History of breast cancer 04/29/2018  . Urinary leakage 04/28/2018  . Malignant neoplasm of central portion of right breast in female, estrogen receptor positive ([place]) 09/02/2016  . Constipation 12/05/2015  . Macular degeneration 06/15/2015  . Back pain 06/15/2015  . Memory difficulties 06/15/2015  . Prediabetes 06/15/2015  . Breast cancer, right breast ([place]) 09/05/2014  . Allergic rhinitis 02/01/2014  . Anemia 09/22/2013  . Invasive ductal carcinoma of breast, stage 1 ([place]) 07/18/2011  . CAD (coronary artery disease) 04/26/2011  . Hyperlipidemia 07/03/2009  . BRUIT 07/21/2008  . Anxiety 09/23/2007  . OSTEOARTHRITIS 09/23/2007  . Osteoporosis 09/23/2007  . Essential hypertension 07/25/2006    Current Outpatient Medications on File Prior to Visit  Medication Sig Dispense Refill  . aspirin EC 81 MG tablet Take 81 mg by mouth at bedtime.    . Calcium Carb-Cholecalciferol [phone] MG-UNIT CAPS Take 1 capsule by mouth 2 (two) times daily.    . cloNIDine (CATAPRES) 0.1 MG tablet Take 1/2 tablet  daily if B/P sustains greater than [id number] systolic 90 tablet 3  . diazepam (VALIUM) 10 MG tablet TAKE 1/2 TABLET BY MOUTH EVERY 12 HOURS AS NEEDED 30 tablet 0  . diltiazem (CARDIZEM CD) 120 MG 24 hr capsule TAKE 1 CAPSULE(120 MG) BY MOUTH DAILY 90 capsule 0  . losartan (COZAAR) 100 MG tablet TAKE 1 TABLET BY MOUTH ONCE DAILY 90 tablet 1  . metoprolol succinate (TOPROL-XL) 25 MG 24 hr tablet Take 1 tablet (25 mg) by mouth twice daily. 180 tablet 1  . Multiple Vitamin (MULTIVITAMIN WITH MINERALS) TABS tablet Take 1 tablet by mouth daily.    . Multiple Vitamins-Minerals (ICAPS AREDS FORMULA PO) Take 2 tablets by mouth.    . omega-3 acid ethyl esters (LOVAZA) 1 g capsule Take 1 g by mouth 2 (two) times daily.    . prazosin (MINIPRESS) 1 MG capsule TAKE 2 CAPSULES BY MOUTH EVERY MORNING, 1 EVERY EVENING, AND 2 AT BEDTIME 450 capsule 0  . psyllium (METAMUCIL) 58.6 % powder Take 1 packet by mouth 3 (three) times daily as needed.    . simvastatin (ZOCOR) 20 MG tablet TAKE 1 TABLET BY MOUTH EVERY DAY 90 tablet 0  . triamcinolone cream (KENALOG) 0.1 % Apply 1 application topically 2 (two) times daily. 45 g 0  . Vitamin D, Ergocalciferol, (DRISDOL) 1.25 MG (50000 UT) CAPS capsule TAKE 1 CAPSULE BY MOUTH EVERY WEEK 12 capsule 0  No current facility-administered medications on file prior to visit.     Past Medical History:  Diagnosis Date  . Anemia 12/10/2011  . ANXIETY 09/23/2007  . Arthritis   . Atrial tachycardia ([place])   . Breast cancer ([place])   . HYPERLIPIDEMIA 07/03/2009  . HYPERTENSION 07/25/2006  . Macular degeneration   . MVP (mitral valve prolapse)   . Osteoarthritis   . Osteoporosis   . Prediabetes 06/15/2015    Past Surgical History:  Procedure Laterality Date  . BREAST LUMPECTOMY Right 08/08/2011  . BREAST SURGERY  08/08/11   lumpectomy  . CARPAL TUNNEL RELEASE  09/2004  . CATARACT EXTRACTION  04/2000, 03/2002    Social History   Socioeconomic History  . Marital status: Widowed     Spouse name: Not on file  . Number of children: 3  . Years of education: Not on file  . Highest education level: Not on file  Occupational History  . Occupation: Secretary/administrator  Social Needs  . Financial resource strain: Not hard at all  . Food insecurity    Worry: Never true    Inability: Never true  . Transportation needs    Medical: No    Non-medical: No  Tobacco Use  . Smoking status: Former Smoker    Quit date: 07/08/1994    Years since quitting: 24.7  . Smokeless tobacco: Never Used  Substance and Sexual Activity  . Alcohol use: Yes    Comment: 1/2 can of beer with supper occ.  . Drug use: No  . Sexual activity: Not Currently    Partners: Male  Lifestyle  . Physical activity    Days per week: 5 days    Minutes per session: 40 min  . Stress: Not at all  Relationships  . Social connections    Talks on phone: More than three times a week    Gets together: More than three times a week    Attends religious service: More than 4 times per year    Active member of club or organization: Yes    Attends meetings of clubs or organizations: More than 4 times per year    Relationship status: Widowed  Other Topics Concern  . Not on file  Social History Narrative  . Not on file    Family History  Problem Relation Age of Onset  . Heart failure Mother   . Stroke Father   . Hypertension Other   . Colon cancer Neg Hx     Review of Systems  Constitutional: Negative for chills and fever.  HENT: Positive for sneezing. Negative for congestion, ear pain, postnasal drip, rhinorrhea, sinus pain and sore throat (? irritated).   Respiratory: Negative for cough, shortness of breath and wheezing.   Neurological: Negative for dizziness and headaches.       Objective:   Vitals:   03/30/19 1432  BP: (!) 164/72  Pulse: 62  Resp: 16  Temp: 98.4 F (36.9 C)  SpO2: 97%   BP Readings from Last 3 Encounters:  03/30/19 (!) 164/72  02/17/19 (!) 148/64  11/10/18 (!) 168/62   Wt  Readings from Last 3 Encounters:  03/30/19 100 lb (45.4 kg)  02/17/19 102 lb 6.4 oz (46.4 kg)  11/10/18 99 lb 8 oz (45.1 kg)   Body mass index is 20.2 kg/m.   Physical Exam    GENERAL APPEARANCE: Appears stated age, well appearing, NAD EYES: conjunctiva clear, no icterus HEENT: bilateral tympanic membranes and ear canals normal, oropharynx with no  erythema, no thyromegaly, trachea midline, no cervical or supraclavicular lymphadenopathy LUNGS: Clear to auscultation without wheeze or crackles, unlabored breathing, good air entry bilaterally CARDIOVASCULAR: Normal [id number] with 2/6 systolic murmurs, no edema SKIN: Warm, dry, no rash or redness      Assessment & Plan:    See Problem List for Assessment and Plan of chronic medical problems.

## 2019-03-29 NOTE — Telephone Encounter (Signed)
[place] Controlled Database Checked Last filled: 03/01/19 # 30 LOV w/you: 01/05/19 Next appt w/you: 03/30/19

## 2019-03-30 ENCOUNTER — Other Ambulatory Visit: Payer: Self-pay | Admitting: Internal Medicine

## 2019-03-30 ENCOUNTER — Ambulatory Visit (INDEPENDENT_AMBULATORY_CARE_PROVIDER_SITE_OTHER): Payer: Medicare Other | Admitting: Internal Medicine

## 2019-03-30 ENCOUNTER — Encounter: Payer: Self-pay | Admitting: Internal Medicine

## 2019-03-30 ENCOUNTER — Other Ambulatory Visit: Payer: Self-pay

## 2019-03-30 DIAGNOSIS — I2510 Atherosclerotic heart disease of native coronary artery without angina pectoris: Secondary | ICD-10-CM | POA: Diagnosis not present

## 2019-03-30 DIAGNOSIS — L989 Disorder of the skin and subcutaneous tissue, unspecified: Secondary | ICD-10-CM | POA: Insufficient documentation

## 2019-03-30 NOTE — Assessment & Plan Note (Signed)
Had a skin abnormality below her nose that lasted about 2 weeks No obvious cause No other concerning symptoms She was concerned about the possibility of this being COVID, but this is not consistent with COVID symptoms and she did not have any other concerning symptoms Reassured her Skin abnormality has resolved No further evaluation or testing necessary She will let me know if this recurs

## 2019-03-30 NOTE — Patient Instructions (Signed)
You are unlikely to have had COVID.  If you have any concerning symptoms please let us know.

## 2019-03-31 ENCOUNTER — Other Ambulatory Visit: Payer: Self-pay

## 2019-03-31 ENCOUNTER — Emergency Department (HOSPITAL_COMMUNITY): Payer: Medicare Other

## 2019-03-31 ENCOUNTER — Emergency Department (HOSPITAL_COMMUNITY)
Admission: EM | Admit: 2019-03-31 | Discharge: 2019-03-31 | Disposition: A | Discharge status: Home / Self Care | Payer: Medicare Other | Attending: Emergency Medicine | Admitting: Emergency Medicine

## 2019-03-31 ENCOUNTER — Encounter (HOSPITAL_COMMUNITY): Payer: Self-pay | Admitting: Emergency Medicine

## 2019-03-31 DIAGNOSIS — Z87891 Personal history of nicotine dependence: Secondary | ICD-10-CM | POA: Insufficient documentation

## 2019-03-31 DIAGNOSIS — I259 Chronic ischemic heart disease, unspecified: Secondary | ICD-10-CM | POA: Insufficient documentation

## 2019-03-31 DIAGNOSIS — Y9389 Activity, other specified: Secondary | ICD-10-CM | POA: Insufficient documentation

## 2019-03-31 DIAGNOSIS — Y92008 Other place in unspecified non-institutional (private) residence as the place of occurrence of the external cause: Secondary | ICD-10-CM | POA: Insufficient documentation

## 2019-03-31 DIAGNOSIS — S79912A Unspecified injury of left hip, initial encounter: Secondary | ICD-10-CM | POA: Diagnosis present

## 2019-03-31 DIAGNOSIS — Y999 Unspecified external cause status: Secondary | ICD-10-CM | POA: Diagnosis not present

## 2019-03-31 DIAGNOSIS — I10 Essential (primary) hypertension: Secondary | ICD-10-CM | POA: Diagnosis not present

## 2019-03-31 DIAGNOSIS — S76012A Strain of muscle, fascia and tendon of left hip, initial encounter: Secondary | ICD-10-CM

## 2019-03-31 DIAGNOSIS — W01198A Fall on same level from slipping, tripping and stumbling with subsequent striking against other object, initial encounter: Secondary | ICD-10-CM | POA: Diagnosis not present

## 2019-03-31 DIAGNOSIS — Z79899 Other long term (current) drug therapy: Secondary | ICD-10-CM | POA: Insufficient documentation

## 2019-03-31 DIAGNOSIS — Z7982 Long term (current) use of aspirin: Secondary | ICD-10-CM | POA: Insufficient documentation

## 2019-03-31 NOTE — ED Provider Notes (Signed)
[facility] DEPT Provider Note   CSN: [id number] Arrival date & time: 03/31/19  1739     History   Chief Complaint Chief Complaint  Patient presents with  . Fall  . Hip Pain    HPI Gabrielle Warren is a 85 y.o. female.  Presents emergency department after a fall this morning.  Patient reports that she fell around 9 AM while walking to the kitchen, landing on her left hip, thinks she tripped over a rug.  Denies loss of consciousness, no lightheadedness no associated chest pain, difficulty breathing.  Patient denies any head trauma, no neck or back pain.  Has been able to ambulate since but has had some moderate left hip pain, worsened with movement or bearing weight.  She denies taking any blood thinners.  No ongoing symptoms currently at rest.  Lives with her daughter.     HPI  Past Medical History:  Diagnosis Date  . Anemia 12/10/2011  . ANXIETY 09/23/2007  . Arthritis   . Atrial tachycardia ([place])   . Breast cancer ([place])   . HYPERLIPIDEMIA 07/03/2009  . HYPERTENSION 07/25/2006  . Macular degeneration   . MVP (mitral valve prolapse)   . Osteoarthritis   . Osteoporosis   . Prediabetes 06/15/2015    Patient Active Problem List   Diagnosis Date Noted  . Skin abnormality 03/30/2019  . Decreased hearing 01/05/2019  . Rash and nonspecific skin eruption 11/10/2018  . History of breast cancer 04/29/2018  . Urinary leakage 04/28/2018  . Malignant neoplasm of central portion of right breast in female, estrogen receptor positive ([place]) 09/02/2016  . Constipation 12/05/2015  . Macular degeneration 06/15/2015  . Back pain 06/15/2015  . Memory difficulties 06/15/2015  . Prediabetes 06/15/2015  . Breast cancer, right breast ([place]) 09/05/2014  . Allergic rhinitis 02/01/2014  . Anemia 09/22/2013  . Invasive ductal carcinoma of breast, stage 1 ([place]) 07/18/2011  . CAD (coronary artery disease) 04/26/2011  . Hyperlipidemia 07/03/2009  . BRUIT  07/21/2008  . Anxiety 09/23/2007  . OSTEOARTHRITIS 09/23/2007  . Osteoporosis 09/23/2007  . Essential hypertension 07/25/2006    Past Surgical History:  Procedure Laterality Date  . BREAST LUMPECTOMY Right 08/08/2011  . BREAST SURGERY  08/08/11   lumpectomy  . CARPAL TUNNEL RELEASE  09/2004  . CATARACT EXTRACTION  04/2000, 03/2002     OB History   No obstetric history on file.      Home Medications    Prior to Admission medications   Medication Sig Start Date End Date Taking? Authorizing Provider  aspirin EC 81 MG tablet Take 81 mg by mouth at bedtime.    [provider]  Calcium Carb-Cholecalciferol [phone] MG-UNIT CAPS Take 1 capsule by mouth 2 (two) times daily.    [provider]  cloNIDine (CATAPRES) 0.1 MG tablet Take 1/2 tablet daily if B/P sustains greater than 99991111 systolic 0000000   Minus Breeding, MD  diazepam (VALIUM) 10 MG tablet TAKE 1/2 TABLET BY MOUTH EVERY 12 HOURS AS NEEDED 03/29/19   Binnie Rail, MD  diltiazem (CARDIZEM CD) 120 MG 24 hr capsule TAKE 1 CAPSULE(120 MG) BY MOUTH DAILY 01/25/19   Binnie Rail, MD  losartan (COZAAR) 100 MG tablet TAKE 1 TABLET BY MOUTH ONCE DAILY 10/26/18   Binnie Rail, MD  metoprolol succinate (TOPROL-XL) 25 MG 24 hr tablet Take 1 tablet (25 mg) by mouth twice daily. 01/05/19   Binnie Rail, MD  Multiple Vitamin (MULTIVITAMIN WITH MINERALS) TABS tablet  Take 1 tablet by mouth daily.    [provider]  Multiple Vitamins-Minerals (ICAPS AREDS FORMULA PO) Take 2 tablets by mouth.    [provider]  omega-3 acid ethyl esters (LOVAZA) 1 g capsule Take 1 g by mouth 2 (two) times daily.    [provider]  prazosin (MINIPRESS) 1 MG capsule TAKE 2 CAPSULES BY MOUTH EVERY MORNING, 1 EVERY EVENING, AND 2 AT BEDTIME 03/30/19   Burns, Claudina Lick, MD  psyllium (METAMUCIL) 58.6 % powder Take 1 packet by mouth 3 (three) times daily as needed.    [provider]  simvastatin (ZOCOR) 20 MG  tablet TAKE 1 TABLET BY MOUTH EVERY DAY 02/15/19   Binnie Rail, MD  triamcinolone cream (KENALOG) 0.1 % Apply 1 application topically 2 (two) times daily. 03/17/18   Marrian Salvage, FNP  Vitamin D, Ergocalciferol, (DRISDOL) 1.25 MG (50000 UT) CAPS capsule TAKE 1 CAPSULE BY MOUTH EVERY WEEK 03/25/19   Binnie Rail, MD    Family History Family History  Problem Relation Age of Onset  . Heart failure Mother   . Stroke Father   . Hypertension Other   . Colon cancer Neg Hx     Social History Social History   Tobacco Use  . Smoking status: Former Smoker    Quit date: 07/08/1994    Years since quitting: 24.7  . Smokeless tobacco: Never Used  Substance Use Topics  . Alcohol use: Yes    Comment: 1/2 can of beer with supper occ.  . Drug use: No     Allergies   Fosamax [alendronate sodium]   Review of Systems Review of Systems  Constitutional: Negative for chills and fever.  HENT: Negative for ear pain and sore throat.   Eyes: Negative for pain and visual disturbance.  Respiratory: Negative for cough and shortness of breath.   Cardiovascular: Negative for chest pain and palpitations.  Gastrointestinal: Negative for abdominal pain and vomiting.  Genitourinary: Negative for dysuria and hematuria.  Musculoskeletal: Positive for arthralgias. Negative for back pain.  Skin: Negative for color change and rash.  Neurological: Negative for seizures and syncope.  All other systems reviewed and are negative.    Physical Exam Updated Vital Signs BP (!) 144/80   Pulse 60   Temp 98 F (36.7 C)   Resp 18   SpO2 100%   Physical Exam Vitals signs and nursing note reviewed.  Constitutional:      General: She is not in acute distress.    Appearance: She is well-developed.  HENT:     Head: Normocephalic and atraumatic.  Eyes:     Conjunctiva/sclera: Conjunctivae normal.  Neck:     Musculoskeletal: Neck supple.  Cardiovascular:     Rate and Rhythm: Normal rate and regular  rhythm.     Heart sounds: No murmur.  Pulmonary:     Effort: Pulmonary effort is normal. No respiratory distress.     Breath sounds: Normal breath sounds.  Abdominal:     Palpations: Abdomen is soft.     Tenderness: There is no abdominal tenderness.  Musculoskeletal:     Comments: Back: no TTP over C, T, L spine RUE: no edema, deformity, nor TTP throughout extremity, good pulses distally LUE: no edema, deformity, nor TTP throughout extremity, good pulses distally RLE: no edema, deformity, nor TTP throughout extremity, good pulses distally LLE: TTP over lateral hip, no TTP throughout remainder of extremity, normal hip, knee, ankle ROM, sensation and distal motor intact,  normal distal pulses  Skin:    General: Skin is warm and dry.     Capillary Refill: Capillary refill takes less than 2 seconds.  Neurological:     General: No focal deficit present.     Mental Status: She is alert and oriented to person, place, and time.  Psychiatric:        Mood and Affect: Mood normal.        Behavior: Behavior normal.      ED Treatments / Results  Labs (all labs ordered are listed, but only abnormal results are displayed) Labs Reviewed  BASIC METABOLIC PANEL  CBC WITH DIFFERENTIAL/PLATELET    EKG None  Radiology Dg Hip Unilat With Pelvis 2-3 Views Left  Result Date: 03/31/2019 CLINICAL DATA:  Fall, pelvic pain EXAM: DG HIP (WITH OR WITHOUT PELVIS) 2-3V LEFT COMPARISON:  None. FINDINGS: No acute bony abnormality. Specifically, no fracture, subluxation, or dislocation. Hip joints are maintained. SI joints symmetric and unremarkable. Degenerative changes in the visualized lower lumbar spine. IMPRESSION: No acute bony abnormality. Electronically Signed   By: Rolm Baptise M.D.   On: 03/31/2019 18:55    Procedures Procedures (including critical care time)  Medications Ordered in ED Medications - No data to display   Initial Impression / Assessment and Plan / ED Course  I have reviewed  the triage vital signs and the nursing notes.  Pertinent labs & imaging results that were available during my care of the patient were reviewed by me and considered in my medical decision making (see chart for details).        85 year old mechanical fall isolated left hip pain.  On exam well-appearing, no trauma noted except noted mild tenderness over the left lateral hip. Neurovascularly intact. X-rays negative.  Patient ambulating without significant difficulty.  Will discharge home.    After the discussed management above, the patient was determined to be safe for discharge.  The patient was in agreement with this plan and all questions regarding their care were answered.  ED return precautions were discussed and the patient will return to the ED with any significant worsening of condition.   Final Clinical Impressions(s) / ED Diagnoses   Final diagnoses:  Strain of left hip, initial encounter    ED Discharge Orders    None       Lucrezia Starch, MD 03/31/19 [phone]

## 2019-03-31 NOTE — Discharge Instructions (Signed)
Recommend taking Tylenol for pain control.  Recommend following up with your primary doctor for recheck next week.  If you have any further falls, any episodes of passing out, chest pain, difficulty breathing or other new concerning symptom, please return here for reassessment.

## 2019-03-31 NOTE — ED Triage Notes (Signed)
Pt reports she fell this morning around 9am. Denies LOC. Having some pelvic pains when moving around

## 2019-04-05 ENCOUNTER — Other Ambulatory Visit: Payer: Self-pay | Admitting: Orthopedic Surgery

## 2019-04-05 ENCOUNTER — Ambulatory Visit
Admission: RE | Admit: 2019-04-05 | Discharge: 2019-04-05 | Disposition: A | Discharge status: Home / Self Care | Payer: Medicare Other | Source: Ambulatory Visit | Attending: Orthopedic Surgery | Admitting: Orthopedic Surgery

## 2019-04-05 DIAGNOSIS — M25552 Pain in left hip: Secondary | ICD-10-CM

## 2019-04-19 ENCOUNTER — Encounter: Payer: Self-pay | Admitting: Internal Medicine

## 2019-04-19 ENCOUNTER — Other Ambulatory Visit: Payer: Self-pay

## 2019-04-19 ENCOUNTER — Ambulatory Visit (INDEPENDENT_AMBULATORY_CARE_PROVIDER_SITE_OTHER): Payer: Medicare Other | Admitting: Internal Medicine

## 2019-04-19 ENCOUNTER — Ambulatory Visit: Payer: Self-pay | Admitting: *Deleted

## 2019-04-19 DIAGNOSIS — K5900 Constipation, unspecified: Secondary | ICD-10-CM

## 2019-04-19 MED ORDER — DOCUSATE SODIUM 100 MG PO CAPS: 200.0000 mg | ORAL_CAPSULE | Freq: Every day | ORAL | 3 refills | Status: DC

## 2019-04-19 MED ORDER — BISACODYL EC 5 MG PO TBEC: 5.0000 mg | DELAYED_RELEASE_TABLET | Freq: Every day | ORAL | 0 refills | Status: AC | PRN

## 2019-04-19 NOTE — Assessment & Plan Note (Signed)
Acute on chronic constipation Taking Metamucil and milk of magnesia, which does not seem to be working She did have a small bowel movement this morning, but can feel hard stool in her rectum This is not new for her She has not experienced any abdominal pain Continue Metamucil and milk of magnesia She does not want to try MiraLAX-prefers pills Start Colace 200 mg daily Bisacodyl daily 5 mg as needed-she states she has taken this in the past and it was effective  Advised for her to call if she has any questions, concerns or if there is no improvement in her symptoms

## 2019-04-19 NOTE — Telephone Encounter (Signed)
Patient calling with complaints of constipation for the past couple of days. Pt states she had a bowel movement this morning but it was only a little and it was loose. Pt states she could feel hard stool in her rectum. Pt has been taking Metamucil,MOM and drinking water with no relief.Pt advised to seek treatment in the ED/urgent care if abdominal pain occurs of if symptoms become worse.Pt has a history of constipation. Call transferred [place] in the office so that appt could be scheduled.  Reason for Disposition . [1] Constipation persists > 1 week AND [2] no improvement after using CARE ADVICE  Answer Assessment - Initial Assessment Questions 1. STOOL PATTERN OR FREQUENCY: "How often do you pass bowel movements (BMs)?"  (Normal range: tid to q 3 days)  "When was the last BM passed?"       A little bit this morning felt with her finger this morning 2. STRAINING: "Do you have to strain to have a BM?"      No straining 3. RECTAL PAIN: "Does your rectum hurt when the stool comes out?" If so, ask: "Do you have hemorrhoids? How bad is the pain?"  (Scale 1-10; or mild, moderate, severe)     No 4. STOOL COMPOSITION: "Are the stools hard?"      Hard stools, loose not formed 5. BLOOD ON STOOLS: "Has there been any blood on the toilet tissue or on the surface of the BM?" If so, ask: "When was the last time?"      No 6. CHRONIC CONSTIPATION: "Is this a new problem for you?"  If no, ask: "How long have you had this problem?" (days, weeks, months)      Yes has a history of constipation 7. CHANGES IN DIET: "Have there been any recent changes in your diet?"      No 8. MEDICATIONS: "Have you been taking any new medications?"     No 9. LAXATIVES: "Have you been using any laxatives or enemas?"  If yes, ask "What, how often, and when was the last time?"     MOM 10. CAUSE: "What do you think is causing the constipation?"        Hx of contipation 11. OTHER SYMPTOMS: "Do you have any other symptoms?" (e.g.,  abdominal pain, fever, vomiting)       no 12. PREGNANCY: "Is there any chance you are pregnant?" "When was your last menstrual period?"       n/a  Protocols used: CONSTIPATION-A-AH

## 2019-04-19 NOTE — Progress Notes (Signed)
Virtual Visit via telephone Note  I connected with Gabrielle Warren on 04/19/19 at  3:45 PM EDT by telephone and verified that I am speaking with the correct person using two identifiers.   I discussed the limitations of evaluation and management by telemedicine and the availability of in person appointments. The patient expressed understanding and agreed to proceed.  The patient is currently at home and I am in the office.    No referring provider.    History of Present Illness: This is an acute visit for constipation.  She states constipation for the past couple of days.  Her last good BM was a couple of weeks ago.  3 weeks ago she fell at home and fractured her pelvis.  She is taking tylenol for the pain.  She did have a small bowel movement this morning, but it was very small and it was loose.  She was able to feel hard stool in her rectum.  She has been taking Metamucil, milk of magnesia and drinking water.  She denies any abdominal pain.  She has a history of chronic constipation and intermittently has had acute refractory constipation to her usual medications.  She is able to get small amounts of stool out during the day, but still feels like she has a large amount of stool inside her.    Review of Systems  Constitutional: Negative for chills and fever.  Gastrointestinal: Positive for constipation. Negative for abdominal pain, blood in stool and nausea.       Social History   Socioeconomic History  . Marital status: Widowed    Spouse name: Not on file  . Number of children: 3  . Years of education: Not on file  . Highest education level: Not on file  Occupational History  . Occupation: Secretary/administrator  Social Needs  . Financial resource strain: Not hard at all  . Food insecurity    Worry: Never true    Inability: Never true  . Transportation needs    Medical: No    Non-medical: No  Tobacco Use  . Smoking status: Former Smoker    Quit date: 07/08/1994    Years  since quitting: 24.7  . Smokeless tobacco: Never Used  Substance and Sexual Activity  . Alcohol use: Yes    Comment: 1/2 can of beer with supper occ.  . Drug use: No  . Sexual activity: Not Currently    Partners: Male  Lifestyle  . Physical activity    Days per week: 5 days    Minutes per session: 40 min  . Stress: Not at all  Relationships  . Social connections    Talks on phone: More than three times a week    Gets together: More than three times a week    Attends religious service: More than 4 times per year    Active member of club or organization: Yes    Attends meetings of clubs or organizations: More than 4 times per year    Relationship status: Widowed  Other Topics Concern  . Not on file  Social History Narrative  . Not on file      Assessment and Plan:  See Problem List for Assessment and Plan of chronic medical problems.   Follow Up Instructions:    I discussed the assessment and treatment plan with the patient. The patient was provided an opportunity to ask questions and all were answered. The patient agreed with the plan and demonstrated an understanding of the instructions.  The patient was advised to call back or seek an in-person evaluation if the symptoms worsen or if the condition fails to improve as anticipated.  Time spent on telephone: 8 minutes.  Binnie Rail, MD

## 2019-04-21 ENCOUNTER — Ambulatory Visit: Payer: Self-pay | Admitting: *Deleted

## 2019-04-21 NOTE — Telephone Encounter (Signed)
Calls with constipation for what she thinks to be about a week.  No abdominal pain no N/V no fever, no nausea/vomiting. Says her rectum feels full.She takes bisacodyl 5 mg tab daily, metamucil powder several times daily, colace 100 mg daily. Drinks more than 5 glasses of water daily, eating fruits and vegetables daily she reported. She has tried suppository daily and a saline enema this week with no results from any. Suggested a fleets enema with some assistance. Routing to provider for further advice regarding laxative. Reviewed urgent symptoms she would need to call with including abdominal pain, swollen abdomen or vomiting. Stated she understood and would like to try a stronger laxative before any disimpaction is done. Pharmacy on file. Routing to pcp for further advice.  Pharmacy on file. Reason for Disposition . Last bowel movement (BM) > 4 days ago  Answer Assessment - Initial Assessment Questions 1. STOOL PATTERN OR FREQUENCY: "How often do you pass bowel movements (BMs)?"  (Normal range: tid to q 3 days)  "When was the last BM passed?"       Has been about one week she thinks since having a BM. 2. STRAINING: "Do you have to strain to have a BM?"      yes 3. RECTAL PAIN: "Does your rectum hurt when the stool comes out?" If so, ask: "Do you have hemorrhoids? How bad is the pain?"  (Scale 1-10; or mild, moderate, severe)     no 4. STOOL COMPOSITION: "Are the stools hard?"     Hard small pieces 5. BLOOD ON STOOLS: "Has there been any blood on the toilet tissue or on the surface of the BM?" If so, ask: "When was the last time?"     no 6. CHRONIC CONSTIPATION: "Is this a new problem for you?"  If no, ask: "How long have you had this problem?" (days, weeks, months)    On and off 7. CHANGES IN DIET: "Have there been any recent changes in your diet?"      No 8. MEDICATIONS: "Have you been taking any new medications?"     no 9. LAXATIVES: "Have you been using any laxatives or enemas?"  If yes,  ask "What, how often, and when was the last time?"     Yes but no results with enema this week. 10. CAUSE: "What do you think is causing the constipation?"         11. OTHER SYMPTOMS: "Do you have any other symptoms?" (e.g., abdominal pain, fever, vomiting)      no 12. PREGNANCY: "Is there any chance you are pregnant?" "When was your last menstrual period?"       na  Protocols used: CONSTIPATION-A-AH

## 2019-04-22 NOTE — Telephone Encounter (Signed)
Spoke with patient today. She states she was finally able to have a nice size bowel movement today.

## 2019-04-22 NOTE — Telephone Encounter (Signed)
I would recommend that she try a Fleet enema as long as she is passing some gas or having a small amount of stool, in the past day or so.  If she is not passing any gas and has not had any stool come out in the past 4 days then she may need to go to the emergency room for disimpaction.Gabrielle Warren

## 2019-04-23 ENCOUNTER — Ambulatory Visit: Payer: Self-pay

## 2019-04-23 NOTE — Telephone Encounter (Signed)
Per PCP pt needs to go to the ED. Pt informed. She states she does not want to go to the ED, but she will try to get her daughter to take her.

## 2019-04-23 NOTE — Telephone Encounter (Signed)
Will you run this by Dr. Quay Burow. I do not think she is able to be seen this afternoon.

## 2019-04-23 NOTE — Telephone Encounter (Signed)
Incoming call from Patient who complains of sever constipation.  Has tried laxatives and and enemas.  No relief.  Patient would like to be seen this afternoon if possible Request a return call related to an appointment. Reorts rectal pain.  Stools are hard.  Patient reques a return call to see if pt can please come in this afternoon        Answer Assessment - Initial Assessment Questions 1. STOOL PATTERN OR FREQUENCY: "How often do you pass bowel movements (BMs)?"  (Normal range: tid to q 3 days)  "When was the last BM passed?"       dont remember 2. STRAINING: "Do you have to strain to have a BM?"      yes 3. RECTAL PAIN: "Does your rectum hurt when the stool comes out?" If so, ask: "Do you have hemorrhoids? How bad is the pain?"  (Scale 1-10; or mild, moderate, severe)     denis 4. STOOL COMPOSITION: "Are the stools hard?"      hard 5. BLOOD ON STOOLS: "Has there been any blood on the toilet tissue or on the surface of the BM?" If so, ask: "When was the last time?"      *No Answer* 6. CHRONIC CONSTIPATION: "Is this a new problem for you?"  If no, ask: "How long have you had this problem?" (days, weeks, months)      Denies no appetite 7. CHANGES IN DIET: "Have there been any recent changes in your diet?"     denies 8. MEDICATIONS: "Have you been taking any new medications?"     9. LAXATIVES: "Have you been using any laxatives or enemas?"  If yes, ask "What, how often, and when was the last time?"     10. CAUSE: "What do you think is causing the constipation?"        *No Answer* 11. OTHER SYMPTOMS: "Do you have any other symptoms?" (e.g., abdominal pain, fever, vomiting)      Fever all night long. 12. PREGNANCY: "Is there any chance you are pregnant?" "When was your last menstrual period?"     na  Protocols used: CONSTIPATION-A-AH

## 2019-04-27 ENCOUNTER — Other Ambulatory Visit: Payer: Self-pay

## 2019-04-27 MED ORDER — LOSARTAN POTASSIUM 100 MG PO TABS: 100.0000 mg | ORAL_TABLET | Freq: Every day | ORAL | 1 refills | Status: DC

## 2019-05-03 ENCOUNTER — Telehealth: Payer: Self-pay | Admitting: Internal Medicine

## 2019-05-03 NOTE — Telephone Encounter (Signed)
[place] Controlled Database Checked Last filled: 03/29/19 #30 LOV w/you: 01/05/19 for anxiety Next appt w/you: 07/19/19

## 2019-05-04 ENCOUNTER — Telehealth: Payer: Self-pay

## 2019-05-04 NOTE — Progress Notes (Signed)
Virtual Visit via Video Note  I connected with Gabrielle Warren on 05/05/19 at 11:00 AM EDT by a video enabled telemedicine application and verified that I am speaking with the correct person using two identifiers.   I discussed the limitations of evaluation and management by telemedicine and the availability of in person appointments. The patient expressed understanding and agreed to proceed.  The patient is currently at home and I am in the office.  Her daughter is with her, Gabrielle Warren.    No referring provider.    History of Present Illness: This is an acute visit for increased confusion.   Her daughter is concerned about her short-term memory and increased confusion.  She states she has had some short-term memory issues, but it has become more obvious recently.  Her daughter lives with her.  On a few occasions Shawn has called her daughter in the middle of the night asking her where she was-she was in the house sleeping.  She also has had some confusion about what day it was and what day of the week it was.  She has not been sleeping through the night recently.  Her daughter thinks this is because she has been napping more during the day.  That started after her fall where she had some pelvic fractures.  Prior to that she was sleeping well.  They both deny anxiety or depression.  No symptoms consistent with an infection.  We reviewed all of her medications and when she takes them.  Her daughter gives them to her so there has been no mistakes in medications.  Our office has seen some memory issues with Gabrielle Warren at times and I do think she has some short-term memory difficulties.   Review of Systems  Constitutional: Negative for fever.  HENT: Negative for congestion, ear pain and sore throat.        No difficulty swallowing  Respiratory: Negative for cough and shortness of breath.   Cardiovascular: Negative for chest pain and palpitations.  Genitourinary: Negative for dysuria,  frequency, hematuria and urgency.  Neurological: Negative for dizziness, sensory change, speech change, focal weakness and headaches.  Psychiatric/Behavioral: Positive for memory loss. Negative for depression. The patient has insomnia (just recently with napping more during day). The patient is not nervous/anxious.       Social History   Socioeconomic History  . Marital status: Widowed    Spouse name: Not on file  . Number of children: 3  . Years of education: Not on file  . Highest education level: Not on file  Occupational History  . Occupation: Secretary/administrator  Social Needs  . Financial resource strain: Not hard at all  . Food insecurity    Worry: Never true    Inability: Never true  . Transportation needs    Medical: No    Non-medical: No  Tobacco Use  . Smoking status: Former Smoker    Quit date: 07/08/1994    Years since quitting: 24.8  . Smokeless tobacco: Never Used  Substance and Sexual Activity  . Alcohol use: Yes    Comment: 1/2 can of beer with supper occ.  . Drug use: No  . Sexual activity: Not Currently    Partners: Male  Lifestyle  . Physical activity    Days per week: 5 days    Minutes per session: 40 min  . Stress: Not at all  Relationships  . Social connections    Talks on phone: More than three times a week  Gets together: More than three times a week    Attends religious service: More than 4 times per year    Active member of club or organization: Yes    Attends meetings of clubs or organizations: More than 4 times per year    Relationship status: Widowed  Other Topics Concern  . Not on file  Social History Narrative  . Not on file     Observations/Objective: Appears well in NAD Breathing normal Normal mood and affect  Assessment and Plan:  See Problem List for Assessment and Plan of chronic medical problems.   Follow Up Instructions:    I discussed the assessment and treatment plan with the patient. The patient was provided an  opportunity to ask questions and all were answered. The patient agreed with the plan and demonstrated an understanding of the instructions.   The patient was advised to call back or seek an in-person evaluation if the symptoms worsen or if the condition fails to improve as anticipated.    Binnie Rail, MD

## 2019-05-04 NOTE — Telephone Encounter (Signed)
Pt's daughter called to f/up on this request.

## 2019-05-04 NOTE — Telephone Encounter (Signed)
Copied from [place] [id number]. Topic: General - Other >> May 04, 2019  1:06 PM Burchel, Abbi R wrote: Pt's daughter [name] requesting a call back ro discuss pt's worsening confusion.  Please call:   [phone]

## 2019-05-04 NOTE — Telephone Encounter (Signed)
Spoke with daughter and set up appointment to discuss this issue further.

## 2019-05-05 ENCOUNTER — Encounter: Payer: Self-pay | Admitting: Internal Medicine

## 2019-05-05 ENCOUNTER — Encounter: Payer: Self-pay | Admitting: Neurology

## 2019-05-05 ENCOUNTER — Ambulatory Visit (INDEPENDENT_AMBULATORY_CARE_PROVIDER_SITE_OTHER): Payer: Medicare Other | Admitting: Internal Medicine

## 2019-05-05 DIAGNOSIS — I2510 Atherosclerotic heart disease of native coronary artery without angina pectoris: Secondary | ICD-10-CM | POA: Diagnosis not present

## 2019-05-05 DIAGNOSIS — R413 Other amnesia: Secondary | ICD-10-CM | POA: Diagnosis not present

## 2019-05-05 NOTE — Assessment & Plan Note (Signed)
I believe Gabrielle Warren has had some mild memory difficulties for a while, but her daughter has seen an increased recently and her family is concerned Recommended neurological evaluation-neuropsychology testing.  Patient is resistant, but agrees Referral ordered Basic blood work within normal limits earlier this year, will hold off on doing additional blood work since this is a virtual visit Denies depression, anxiety No symptoms consistent with an infection We will await neurological evaluation

## 2019-05-19 ENCOUNTER — Other Ambulatory Visit: Payer: Self-pay | Admitting: Internal Medicine

## 2019-05-24 ENCOUNTER — Other Ambulatory Visit: Payer: Self-pay | Admitting: Oncology

## 2019-05-24 DIAGNOSIS — Z1231 Encounter for screening mammogram for malignant neoplasm of breast: Secondary | ICD-10-CM

## 2019-05-31 ENCOUNTER — Other Ambulatory Visit: Payer: Self-pay | Admitting: Internal Medicine

## 2019-06-01 ENCOUNTER — Other Ambulatory Visit: Payer: Self-pay

## 2019-06-01 NOTE — Telephone Encounter (Signed)
[place] Controlled Database Checked Last filled: 05/04/19 # 30 LOV w/you: 05/05/19 Next appt w/you: 07/19/19

## 2019-06-22 ENCOUNTER — Other Ambulatory Visit: Payer: Self-pay | Admitting: Internal Medicine

## 2019-06-22 NOTE — Telephone Encounter (Signed)
Do you still want patient taking a high dose of vitamin D weekly?

## 2019-06-22 NOTE — Telephone Encounter (Signed)
I do think it would be better for her to do a small dose daily vitamin D so that we make sure she does not get too much.  I would recommend 1000 units daily.

## 2019-06-23 NOTE — Telephone Encounter (Signed)
Daughter aware.

## 2019-06-30 ENCOUNTER — Other Ambulatory Visit: Payer: Self-pay | Admitting: Internal Medicine

## 2019-07-04 ENCOUNTER — Other Ambulatory Visit: Payer: Self-pay | Admitting: Internal Medicine

## 2019-07-05 NOTE — Telephone Encounter (Signed)
[place] Controlled Database Checked Last filled: 06/01/19 # 30 LOV: 10/28/202 Next appt: 07/19/19  Please advise in PCP's absence. Thanks.

## 2019-07-06 ENCOUNTER — Ambulatory Visit: Payer: Medicare Other | Admitting: Neurology

## 2019-07-18 NOTE — Patient Instructions (Addendum)
  Blood work was ordered.     Medications reviewed and updated.  Changes include :   None.  Continue all your current medications and supplements.    Please followup in 6 months    You have lost some weight.  Try to snack during meals.     See other paperwork to schedule COVID vaccine.

## 2019-07-18 NOTE — Progress Notes (Signed)
Subjective:    Patient ID: Gabrielle Warren, female    DOB: March 22, 1926, 86 y.o.   MRN: [id number]  HPI The patient is here for follow up of their chronic medical problems, including CAD, htn, hyperlipidemia, memory difficulties, OP, anxiety, prediabetes, anemia.  She is here today alone.   She is exercising regularly - walking with rolator.    She is taking all of her medications as prescribed.    She is eating well.  She thinks she may be eating less.  She has lost some weight.  She does not snack during the day.    She denies anxiety.    She feels her memory comes and goes. She had an appointment with neurology for further evaluation - she cancelled it but would consider going after COVID gets better.     Medications and allergies reviewed with patient and updated if appropriate.  Patient Active Problem List   Diagnosis Date Noted  . Skin abnormality 03/30/2019  . Decreased hearing 01/05/2019  . Rash and nonspecific skin eruption 11/10/2018  . History of breast cancer 04/29/2018  . Urinary leakage 04/28/2018  . Malignant neoplasm of central portion of right breast in female, estrogen receptor positive ([place]) 09/02/2016  . Constipation 12/05/2015  . Macular degeneration 06/15/2015  . Back pain 06/15/2015  . Memory difficulties 06/15/2015  . Prediabetes 06/15/2015  . Breast cancer, right breast ([place]) 09/05/2014  . Allergic rhinitis 02/01/2014  . Anemia 09/22/2013  . Invasive ductal carcinoma of breast, stage 1 ([place]) 07/18/2011  . CAD (coronary artery disease) 04/26/2011  . Hyperlipidemia 07/03/2009  . BRUIT 07/21/2008  . Anxiety 09/23/2007  . OSTEOARTHRITIS 09/23/2007  . Osteoporosis 09/23/2007  . Essential hypertension 07/25/2006    Current Outpatient Medications on File Prior to Visit  Medication Sig Dispense Refill  . aspirin EC 81 MG tablet Take 81 mg by mouth at bedtime.    . bisacodyl (BISACODYL) 5 MG EC tablet Take 1 tablet (5 mg total) by mouth  daily as needed for moderate constipation. 30 tablet 0  . Calcium Carb-Cholecalciferol [phone] MG-UNIT CAPS Take 1 capsule by mouth 2 (two) times daily.    . Cholecalciferol (VITAMIN D3) 50 MCG (2000 UT) TABS Take by mouth.    . diazepam (VALIUM) 10 MG tablet TAKE 1/2 TABLET BY MOUTH EVERY 12 HOURS AS NEEDED 30 tablet 0  . diltiazem (CARDIZEM CD) 120 MG 24 hr capsule TAKE 1 CAPSULE(120 MG) BY MOUTH DAILY 90 capsule 0  . docusate sodium (COLACE) 100 MG capsule Take 2 capsules (200 mg total) by mouth daily. For constipation 60 capsule 3  . losartan (COZAAR) 100 MG tablet Take 1 tablet (100 mg total) by mouth daily. 90 tablet 1  . Multiple Vitamin (MULTIVITAMIN WITH MINERALS) TABS tablet Take 1 tablet by mouth daily.    . Multiple Vitamins-Minerals (ICAPS AREDS FORMULA PO) Take 2 tablets by mouth.    . omega-3 acid ethyl esters (LOVAZA) 1 g capsule Take 1 g by mouth 2 (two) times daily.    . prazosin (MINIPRESS) 1 MG capsule TAKE 2 CAPSULES BY MOUTH EVERY MORNING, 1 EVERY EVENING, AND 2 AT BEDTIME 450 capsule 0  . psyllium (METAMUCIL) 58.6 % powder Take 1 packet by mouth 3 (three) times daily as needed.    . simvastatin (ZOCOR) 20 MG tablet TAKE 1 TABLET BY MOUTH EVERY DAY 90 tablet 0  . triamcinolone cream (KENALOG) 0.1 % Apply 1 application topically 2 (two) times daily. 45 g 0  No current facility-administered medications on file prior to visit.    Past Medical History:  Diagnosis Date  . Anemia 12/10/2011  . ANXIETY 09/23/2007  . Arthritis   . Atrial tachycardia ([place])   . Breast cancer ([place])   . HYPERLIPIDEMIA 07/03/2009  . HYPERTENSION 07/25/2006  . Macular degeneration   . MVP (mitral valve prolapse)   . Osteoarthritis   . Osteoporosis   . Prediabetes 06/15/2015    Past Surgical History:  Procedure Laterality Date  . BREAST LUMPECTOMY Right 08/08/2011  . BREAST SURGERY  08/08/11   lumpectomy  . CARPAL TUNNEL RELEASE  09/2004  . CATARACT EXTRACTION  04/2000, 03/2002    Social  History   Socioeconomic History  . Marital status: Widowed    Spouse name: Not on file  . Number of children: 3  . Years of education: Not on file  . Highest education level: Not on file  Occupational History  . Occupation: Housekeeper  Tobacco Use  . Smoking status: Former Smoker    Quit date: 07/08/1994    Years since quitting: 25.0  . Smokeless tobacco: Never Used  Substance and Sexual Activity  . Alcohol use: Yes    Comment: 1/2 can of beer with supper occ.  . Drug use: No  . Sexual activity: Not Currently    Partners: Male  Other Topics Concern  . Not on file  Social History Narrative  . Not on file   Social Determinants of Health   Financial Resource Strain:   . Difficulty of Paying Living Expenses: Not on file  Food Insecurity:   . Worried About Charity fundraiser in the Last Year: Not on file  . Ran Out of Food in the Last Year: Not on file  Transportation Needs:   . Lack of Transportation (Medical): Not on file  . Lack of Transportation (Non-Medical): Not on file  Physical Activity:   . Days of Exercise per Week: Not on file  . Minutes of Exercise per Session: Not on file  Stress:   . Feeling of Stress : Not on file  Social Connections:   . Frequency of Communication with Friends and Family: Not on file  . Frequency of Social Gatherings with Friends and Family: Not on file  . Attends Religious Services: Not on file  . Active Member of Clubs or Organizations: Not on file  . Attends Archivist Meetings: Not on file  . Marital Status: Not on file    Family History  Problem Relation Age of Onset  . Heart failure Mother   . Stroke Father   . Hypertension Other   . Colon cancer Neg Hx     Review of Systems  Constitutional: Negative for chills and fever ( ).  Respiratory: Negative for cough, shortness of breath and wheezing.   Cardiovascular: Negative for chest pain, palpitations and leg swelling.  Neurological: Negative for light-headedness  and headaches.       Objective:   Vitals:   07/19/19 1345  BP: (!) 156/62  Pulse: 60  Resp: 16  Temp: 98.6 F (37 C)  SpO2: 99%   BP Readings from Last 3 Encounters:  07/19/19 (!) 156/62  03/31/19 (!) 144/80  03/30/19 (!) 164/72   Wt Readings from Last 3 Encounters:  07/19/19 97 lb 12.8 oz (44.4 kg)  03/30/19 100 lb (45.4 kg)  02/17/19 102 lb 6.4 oz (46.4 kg)   Body mass index is 19.75 kg/m.   Physical Exam  Constitutional: Appears well-developed and well-nourished. No distress.  HENT:  Head: Normocephalic and atraumatic.  Neck: Neck supple. No tracheal deviation present. No thyromegaly present.  No cervical lymphadenopathy Cardiovascular: Normal rate, regular rhythm and normal heart sounds.   No murmur heard. No carotid bruit .  No edema Pulmonary/Chest: Effort normal and breath sounds normal. No respiratory distress. No has no wheezes. No rales.  Skin: Skin is warm and dry. Not diaphoretic.  Psychiatric: Normal mood and affect. Behavior is normal.      Assessment & Plan:    See Problem List for Assessment and Plan of chronic medical problems.    This visit occurred during the SARS-CoV-2 public health emergency.  Safety protocols were in place, including screening questions prior to the visit, additional usage of staff PPE, and extensive cleaning of exam room while observing appropriate contact time as indicated for disinfecting solutions.

## 2019-07-19 ENCOUNTER — Ambulatory Visit (INDEPENDENT_AMBULATORY_CARE_PROVIDER_SITE_OTHER): Payer: Medicare Other | Admitting: Internal Medicine

## 2019-07-19 ENCOUNTER — Encounter: Payer: Self-pay | Admitting: Internal Medicine

## 2019-07-19 ENCOUNTER — Other Ambulatory Visit: Payer: Self-pay

## 2019-07-19 VITALS — BP 156/62 | HR 60 | Temp 98.6°F | Resp 16 | Ht 59.0 in | Wt 97.8 lb

## 2019-07-19 DIAGNOSIS — I2510 Atherosclerotic heart disease of native coronary artery without angina pectoris: Secondary | ICD-10-CM | POA: Diagnosis not present

## 2019-07-19 DIAGNOSIS — I10 Essential (primary) hypertension: Secondary | ICD-10-CM | POA: Diagnosis not present

## 2019-07-19 DIAGNOSIS — F419 Anxiety disorder, unspecified: Secondary | ICD-10-CM

## 2019-07-19 DIAGNOSIS — M810 Age-related osteoporosis without current pathological fracture: Secondary | ICD-10-CM

## 2019-07-19 DIAGNOSIS — R7303 Prediabetes: Secondary | ICD-10-CM

## 2019-07-19 DIAGNOSIS — E7849 Other hyperlipidemia: Secondary | ICD-10-CM | POA: Diagnosis not present

## 2019-07-19 DIAGNOSIS — R413 Other amnesia: Secondary | ICD-10-CM

## 2019-07-19 DIAGNOSIS — D649 Anemia, unspecified: Secondary | ICD-10-CM

## 2019-07-19 LAB — COMPREHENSIVE METABOLIC PANEL
ALT: 14 U/L (ref 0–35)
AST: 18 U/L (ref 0–37)
Albumin: 3.7 g/dL (ref 3.5–5.2)
Alkaline Phosphatase: 70 U/L (ref 39–117)
BUN: 11 mg/dL (ref 6–23)
CO2: 31 mEq/L (ref 19–32)
Calcium: 9.2 mg/dL (ref 8.4–10.5)
Chloride: 103 mEq/L (ref 96–112)
Creatinine, Ser: 0.96 mg/dL (ref 0.40–1.20)
GFR: 54.46 mL/min — ABNORMAL LOW (ref >60.00)
Glucose, Bld: 118 mg/dL — ABNORMAL HIGH (ref 70–99)
Potassium: 3.8 mEq/L (ref 3.5–5.1)
Sodium: 139 mEq/L (ref 135–145)
Total Bilirubin: 0.4 mg/dL (ref 0.2–1.2)
Total Protein: 6.7 g/dL (ref 6.0–8.3)

## 2019-07-19 LAB — CBC WITH DIFFERENTIAL/PLATELET
Basophils Absolute: 0.1 10*3/uL (ref 0.0–0.1)
Basophils Relative: 1 % (ref 0.0–3.0)
Eosinophils Absolute: 0.1 10*3/uL (ref 0.0–0.7)
Eosinophils Relative: 2.3 % (ref 0.0–5.0)
HCT: 31 % — ABNORMAL LOW (ref 36.0–46.0)
Hemoglobin: 10.5 g/dL — ABNORMAL LOW (ref 12.0–15.0)
Lymphocytes Relative: 23.3 % (ref 12.0–46.0)
Lymphs Abs: 1.5 10*3/uL (ref 0.7–4.0)
MCHC: 33.8 g/dL (ref 30.0–36.0)
MCV: 93.5 fl (ref 78.0–100.0)
Monocytes Absolute: 1 10*3/uL (ref 0.1–1.0)
Monocytes Relative: 15.1 % — ABNORMAL HIGH (ref 3.0–12.0)
Neutro Abs: 3.7 10*3/uL (ref 1.4–7.7)
Neutrophils Relative %: 58.3 % (ref 43.0–77.0)
Platelets: 188 10*3/uL (ref 150.0–400.0)
RBC: 3.31 Mil/uL — ABNORMAL LOW (ref 3.87–5.11)
RDW: 13.3 % (ref 11.5–15.5)
WBC: 6.3 10*3/uL (ref 4.0–10.5)

## 2019-07-19 LAB — LIPID PANEL
Cholesterol: 124 mg/dL (ref 0–200)
HDL: 57.3 mg/dL (ref >39.00)
LDL Cholesterol: 51 mg/dL (ref 0–99)
NonHDL: 67
Total CHOL/HDL Ratio: 2
Triglycerides: 81 mg/dL (ref 0.0–149.0)
VLDL: 16.2 mg/dL (ref 0.0–40.0)

## 2019-07-19 MED ORDER — METOPROLOL SUCCINATE ER 25 MG PO TB24: ORAL_TABLET | ORAL | 2 refills | Status: DC

## 2019-07-19 NOTE — Assessment & Plan Note (Addendum)
BP Readings from Last 3 Encounters:  07/19/19 (!) 156/62  03/31/19 (!) 144/80  03/30/19 (!) 164/72   Chronic Blood pressure slightly elevated, but given her age I really be more lenient I am not sure if she is still taking the Cardizem-it looks like it has not been refilled.  Advised that she go home and have her daughter make sure our list is consistent with what she is taking at home No changes today CMP

## 2019-07-19 NOTE — Assessment & Plan Note (Addendum)
Taking calcium and vitamin D She is doing some walking Does not want to take any medication for her bones Deferred DEXA

## 2019-07-19 NOTE — Assessment & Plan Note (Signed)
Chronic We will check CBC, iron panel

## 2019-07-19 NOTE — Assessment & Plan Note (Signed)
Taking simvastatin, continue Check lipid panel, CMP

## 2019-07-19 NOTE — Assessment & Plan Note (Signed)
Neck Controlled, stable Continue current dose of Valium, which she has been on for years

## 2019-07-19 NOTE — Assessment & Plan Note (Signed)
Chronic Stable Check A1c

## 2019-07-19 NOTE — Assessment & Plan Note (Signed)
Following with cardiology No symptoms consistent with angina Continue current medications CBC, CMP, lipids

## 2019-07-19 NOTE — Assessment & Plan Note (Signed)
There are some memory difficulties and we did have a visit about this a few months ago She states her memory comes and goes.  Long-term memory intact She did have an appointment with neurology, but did cancel it-she would agree to go after the Covid pandemic improves She currently lives with her daughter

## 2019-07-20 ENCOUNTER — Ambulatory Visit
Admission: RE | Admit: 2019-07-20 | Discharge: 2019-07-20 | Disposition: A | Discharge status: Home / Self Care | Payer: Medicare Other | Source: Ambulatory Visit | Attending: Oncology | Admitting: Oncology

## 2019-07-20 DIAGNOSIS — Z1231 Encounter for screening mammogram for malignant neoplasm of breast: Secondary | ICD-10-CM

## 2019-07-20 LAB — IRON,TIBC AND FERRITIN PANEL
%SAT: 34 % (calc) (ref 16–45)
Ferritin: 189 ng/mL (ref 16–288)
Iron: 77 ug/dL (ref 45–160)
TIBC: 225 mcg/dL (calc) — ABNORMAL LOW (ref 250–450)

## 2019-07-20 LAB — HEMOGLOBIN A1C: Hgb A1c MFr Bld: 5.6 % (ref 4.6–6.5)

## 2019-07-25 ENCOUNTER — Other Ambulatory Visit: Payer: Self-pay | Admitting: Internal Medicine

## 2019-08-03 ENCOUNTER — Other Ambulatory Visit: Payer: Self-pay | Admitting: Internal Medicine

## 2019-08-04 NOTE — Telephone Encounter (Signed)
Last OV 07/19/19 Next OV NA Last RF 07/05/19

## 2019-08-09 ENCOUNTER — Ambulatory Visit: Payer: Medicare Other

## 2019-08-10 ENCOUNTER — Telehealth: Payer: Self-pay | Admitting: Internal Medicine

## 2019-08-10 NOTE — Telephone Encounter (Signed)
   Patient wants to know if she needs to get the Covid vaccine Patient added to waitlist

## 2019-08-10 NOTE — Telephone Encounter (Signed)
Yes she should get the covid vaccine

## 2019-08-11 NOTE — Telephone Encounter (Signed)
Notified pt w/MD response.../lmb 

## 2019-08-14 ENCOUNTER — Other Ambulatory Visit: Payer: Self-pay | Admitting: Internal Medicine

## 2019-08-14 ENCOUNTER — Ambulatory Visit: Payer: Medicare Other | Attending: Internal Medicine

## 2019-08-14 DIAGNOSIS — Z23 Encounter for immunization: Secondary | ICD-10-CM

## 2019-08-14 NOTE — Progress Notes (Signed)
   Covid-19 Vaccination Clinic  Name:  Gabrielle Warren    MRN: [id number] DOB: July 05, 1926  08/14/2019  [name] was observed post Covid-19 immunization for 15 minutes without incidence. She was provided with Vaccine Information Sheet and instruction to access the V-Safe system.   [name] was instructed to call 911 with any severe reactions post vaccine: Marland Kitchen Difficulty breathing  . Swelling of your face and throat  . A fast heartbeat  . A bad rash all over your body  . Dizziness and weakness    Immunizations Administered    Name Date Dose VIS Date Route   Pfizer COVID-19 Vaccine 08/14/2019  2:16 PM 0.3 mL 06/18/2019 Intramuscular   Manufacturer: [place]   Lot: [id number]   [place]: [id number]

## 2019-08-15 ENCOUNTER — Other Ambulatory Visit: Payer: Self-pay | Admitting: Internal Medicine

## 2019-09-01 ENCOUNTER — Other Ambulatory Visit: Payer: Self-pay | Admitting: Internal Medicine

## 2019-09-08 ENCOUNTER — Ambulatory Visit: Payer: Medicare Other | Attending: Internal Medicine

## 2019-09-08 DIAGNOSIS — Z23 Encounter for immunization: Secondary | ICD-10-CM | POA: Insufficient documentation

## 2019-09-08 NOTE — Progress Notes (Signed)
   Covid-19 Vaccination Clinic  Name:  Gabrielle Warren    MRN: [id number] DOB: 07/19/1925  09/08/2019  [name] was observed post Covid-19 immunization for 15 minutes without incident. She was provided with Vaccine Information Sheet and instruction to access the V-Safe system.   [name] was instructed to call 911 with any severe reactions post vaccine: Marland Kitchen Difficulty breathing  . Swelling of face and throat  . A fast heartbeat  . A bad rash all over body  . Dizziness and weakness   Immunizations Administered    Name Date Dose VIS Date Route   Pfizer COVID-19 Vaccine 09/08/2019  9:53 AM 0.3 mL 06/18/2019 Intramuscular   Manufacturer: [place]   Lot: [id number]   [place]: [id number]

## 2019-09-14 ENCOUNTER — Other Ambulatory Visit: Payer: Self-pay

## 2019-09-14 MED ORDER — PRAZOSIN HCL 1 MG PO CAPS: ORAL_CAPSULE | ORAL | 1 refills | Status: DC

## 2019-09-15 DIAGNOSIS — R011 Cardiac murmur, unspecified: Secondary | ICD-10-CM | POA: Insufficient documentation

## 2019-09-15 DIAGNOSIS — Z7189 Other specified counseling: Secondary | ICD-10-CM | POA: Insufficient documentation

## 2019-09-15 NOTE — Progress Notes (Signed)
Cardiology Office Note   Date:  09/16/2019   ID:  Gabrielle Warren, DOB 10-23-1925, MRN [id number]  PCP:  Binnie Rail, MD  Cardiologist:   No primary care provider on file.   Chief Complaint  Patient presents with  . Hypertension      History of Present Illness: Gabrielle Warren is a 86 y.o. female who presents for evaluation of hypertension.  Since I last saw her she has done well.  She gets around with a walker living with her daughter.  I think her memory is a little bit less than it used to be.  She thinks her blood pressure is well controlled but she forgot her little blood pressure diary.  I do not think she has been taking extra clonidine for elevated blood pressures.  She states she can usually feel when her pressure is up and she is not feeling that problem any longer.  She is not having any chest discomfort, neck or arm discomfort.  She is not having any palpitations, presyncope or syncope.  She has no shortness of breath.    Past Medical History:  Diagnosis Date  . Anemia 12/10/2011  . ANXIETY 09/23/2007  . Arthritis   . Atrial tachycardia ([place])   . Breast cancer ([place])   . HYPERLIPIDEMIA 07/03/2009  . HYPERTENSION 07/25/2006  . Macular degeneration   . MVP (mitral valve prolapse)   . Osteoarthritis   . Osteoporosis   . Prediabetes 06/15/2015    Past Surgical History:  Procedure Laterality Date  . BREAST LUMPECTOMY Right 08/08/2011  . BREAST SURGERY  08/08/11   lumpectomy  . CARPAL TUNNEL RELEASE  09/2004  . CATARACT EXTRACTION  04/2000, 03/2002     Current Outpatient Medications  Medication Sig Dispense Refill  . aspirin EC 81 MG tablet Take 81 mg by mouth at bedtime.    . bisacodyl (BISACODYL) 5 MG EC tablet Take 1 tablet (5 mg total) by mouth daily as needed for moderate constipation. 30 tablet 0  . Calcium Carb-Cholecalciferol [phone] MG-UNIT CAPS Take 1 capsule by mouth 2 (two) times daily.    . Cholecalciferol (VITAMIN D3) 50 MCG (2000  UT) TABS Take by mouth.    . diazepam (VALIUM) 10 MG tablet TAKE 1/2 TABLET BY MOUTH EVERY 12 HOURS AS NEEDED 30 tablet 0  . diltiazem (CARDIZEM CD) 120 MG 24 hr capsule TAKE 1 CAPSULE(120 MG) BY MOUTH DAILY 90 capsule 1  . losartan (COZAAR) 100 MG tablet Take 1 tablet (100 mg total) by mouth daily. 90 tablet 1  . metoprolol succinate (TOPROL-XL) 25 MG 24 hr tablet Take 1 tablet (25 mg) by mouth twice daily. 180 tablet 2  . Multiple Vitamin (MULTIVITAMIN WITH MINERALS) TABS tablet Take 1 tablet by mouth daily.    . Multiple Vitamins-Minerals (ICAPS AREDS FORMULA PO) Take 2 tablets by mouth.    . omega-3 acid ethyl esters (LOVAZA) 1 g capsule Take 1 g by mouth 2 (two) times daily.    . prazosin (MINIPRESS) 1 MG capsule TAKE 2 CAPSULES BY MOUTH EVERY MORNING, 1 EVERY EVENING, AND 2 AT BEDTIME 450 capsule 1  . psyllium (METAMUCIL) 58.6 % powder Take 1 packet by mouth 3 (three) times daily as needed.    . simvastatin (ZOCOR) 20 MG tablet TAKE 1 TABLET BY MOUTH EVERY DAY 90 tablet 0  . STOOL SOFTENER 100 MG capsule TAKE 2 CAPSULES(200 MG) BY MOUTH DAILY FOR CONSTIPATION 60 capsule 3  . triamcinolone cream (KENALOG)  0.1 % Apply 1 application topically 2 (two) times daily. 45 g 0   No current facility-administered medications for this visit.    Allergies:   Fosamax [alendronate sodium]    ROS:  Please see the history of present illness.   Otherwise, review of systems are positive for none.   All other systems are reviewed and negative.    PHYSICAL EXAM: VS:  BP 137/69   Pulse (!) 55   Ht 5' (1.524 m)   Wt 99 lb 3.2 oz (45 kg)   SpO2 100%   BMI 19.37 kg/m  , BMI Body mass index is 19.37 kg/m. GEN:  No distress NECK:  No jugular venous distention at 90 degrees, waveform within normal limits, carotid upstroke brisk and symmetric, no bruits, no thyromegaly LYMPHATICS:  No cervical adenopathy LUNGS:  Clear to auscultation bilaterally BACK:  No CVA tenderness CHEST:  Unremarkable HEART:  S1  and S2 within normal limits, no S3, no S4, no clicks, no rubs, 2 out of 6 left upper sternal border systolic murmur, no diastolic murmurs ABD:  Positive bowel sounds normal in frequency in pitch, no bruits, no rebound, no guarding, unable to assess midline mass or bruit with the patient seated. EXT:  2 plus pulses throughout, moderate edema, no cyanosis no clubbing SKIN:  No rashes no nodules NEURO:  Cranial nerves II through XII grossly intact, motor grossly intact throughout PSYCH:  Cognitively intact, oriented to person place and time   EKG:  EKG is not ordered today.    Recent Labs: 07/19/2019: ALT 14; BUN 11; Creatinine, Ser 0.96; Hemoglobin 10.5; Platelets 188.0; Potassium 3.8; Sodium 139    Lipid Panel    Component Value Date/Time   CHOL 124 07/19/2019 1443   TRIG 81.0 07/19/2019 1443   HDL 57.30 07/19/2019 1443   CHOLHDL 2 07/19/2019 1443   VLDL 16.2 07/19/2019 1443   LDLCALC 51 07/19/2019 1443      Wt Readings from Last 3 Encounters:  09/16/19 99 lb 3.2 oz (45 kg)  07/19/19 97 lb 12.8 oz (44.4 kg)  03/30/19 100 lb (45.4 kg)      Other studies Reviewed: Additional studies/ records that were reviewed today include: Labs. Review of the above records demonstrates:  Please see elsewhere in the note.     ASSESSMENT AND PLAN:  HYPERTENSION -  Her blood pressures been well controlled with her home by her recollection.  No change in therapy.   MURMUR -   He only wanted conservative management.  No change in therapy.   COVID EDUCATION -  She has gotten both doses of her vaccine  Current medicines are reviewed at length with the patient today.  The patient does not have concerns regarding medicines.  The following changes have been made:  no change  Labs/ tests ordered today include: none No orders of the defined types were placed in this encounter.    Disposition:   FU with me in one year per her request.     Signed, Minus Breeding, MD  09/16/2019 2:05  PM    [place]

## 2019-09-16 ENCOUNTER — Ambulatory Visit (INDEPENDENT_AMBULATORY_CARE_PROVIDER_SITE_OTHER): Payer: Medicare Other | Admitting: Cardiology

## 2019-09-16 ENCOUNTER — Other Ambulatory Visit: Payer: Self-pay

## 2019-09-16 ENCOUNTER — Encounter: Payer: Self-pay | Admitting: Cardiology

## 2019-09-16 VITALS — BP 137/69 | HR 55 | Ht 60.0 in | Wt 99.2 lb

## 2019-09-16 DIAGNOSIS — I10 Essential (primary) hypertension: Secondary | ICD-10-CM

## 2019-09-16 DIAGNOSIS — I2510 Atherosclerotic heart disease of native coronary artery without angina pectoris: Secondary | ICD-10-CM | POA: Diagnosis not present

## 2019-09-16 DIAGNOSIS — Z7189 Other specified counseling: Secondary | ICD-10-CM | POA: Diagnosis not present

## 2019-09-16 DIAGNOSIS — R011 Cardiac murmur, unspecified: Secondary | ICD-10-CM | POA: Diagnosis not present

## 2019-09-16 NOTE — Patient Instructions (Addendum)
Medication Instructions:  No changes *If you need a refill on your cardiac medications before your next appointment, please call your pharmacy*  Lab Work: None needed this visit  Testing/Procedures: None needed this visit  Follow-Up: At [facility], you and your health needs are our priority.  As part of our continuing mission to provide you with exceptional heart care, we have created designated Provider Care Teams.  These Care Teams include your primary Cardiologist (physician) and Advanced Practice Providers (APPs -  Physician Assistants and Nurse Practitioners) who all work together to provide you with the care you need, when you need it.  We recommend signing up for the patient portal called "MyChart".  Sign up information is provided on this After Visit Summary.  MyChart is used to connect with patients for Virtual Visits (Telemedicine).  Patients are able to view lab/test results, encounter notes, upcoming appointments, etc.  Non-urgent messages can be sent to your provider as well.   To learn more about what you can do with MyChart, go to [email or web address]://www.mychart.com.    Your next appointment:   1 year(s)  You will receive a reminder letter in the mail two months in advance. If you don't receive a letter, please call our office to schedule the follow-up appointment.  The format for your next appointment:   In Person  Provider:   James Hochrein, MD   

## 2019-09-30 ENCOUNTER — Other Ambulatory Visit: Payer: Self-pay | Admitting: Internal Medicine

## 2019-10-01 NOTE — Telephone Encounter (Signed)
Last RF 09/02/19 Last OV 07/19/19

## 2019-10-15 ENCOUNTER — Other Ambulatory Visit: Payer: Self-pay | Admitting: Cardiology

## 2019-10-19 ENCOUNTER — Telehealth: Payer: Self-pay

## 2019-10-19 MED ORDER — METOPROLOL SUCCINATE ER 25 MG PO TB24: ORAL_TABLET | ORAL | 3 refills | Status: DC

## 2019-10-19 NOTE — Telephone Encounter (Signed)
1.Medication Requested:metoprolol succinate (TOPROL-XL) 25 MG 24 hr tablet  2. Pharmacy (Name, Street, [place]):[facility] [phone] - [place], [place]  3. On Med List: Yes   4. Last Visit with PCP: 1.11.2021   5. Next visit date with PCP: no appt is made at this time     Agent: Please be advised that RX refills may take up to 3 business days. We ask that you follow-up with your pharmacy.

## 2019-10-19 NOTE — Telephone Encounter (Signed)
Med is rx by pt cardiologist. Acquanetta Belling to Dr. Percival Spanish.Marland KitchenJohny Chess

## 2019-10-19 NOTE — Telephone Encounter (Signed)
Prescription refilled.

## 2019-10-22 ENCOUNTER — Other Ambulatory Visit: Payer: Self-pay

## 2019-12-10 ENCOUNTER — Other Ambulatory Visit: Payer: Self-pay | Admitting: Internal Medicine

## 2019-12-29 ENCOUNTER — Other Ambulatory Visit: Payer: Self-pay | Admitting: Internal Medicine

## 2020-01-13 ENCOUNTER — Other Ambulatory Visit: Payer: Self-pay | Admitting: Internal Medicine

## 2020-01-22 ENCOUNTER — Other Ambulatory Visit: Payer: Self-pay | Admitting: Internal Medicine

## 2020-02-18 ENCOUNTER — Telehealth: Payer: Self-pay | Admitting: Internal Medicine

## 2020-02-18 NOTE — Telephone Encounter (Signed)
New message:   Pt is calling and would like a call back to discuss Dr. Quay Burow and switching providers due to Dr. Quay Burow never laying hands on her or ever using her stethoscope. Please advise.

## 2020-02-24 NOTE — Telephone Encounter (Signed)
Pt states she would like a transfer of care, but is unsure of who she wants to see as she has "never seen anyone else here". Once I find out who will accept transfer of care I will consult with both providers & update patient.

## 2020-03-08 NOTE — Telephone Encounter (Signed)
Spoke with daughter re: pt's provider concerns.  Shared that Dr Quay Burow believes the patient may have beginning stages of dementia.  Daughter confirmed that this was not the 1st time she had heard this; states Dr Quay Burow referred her for a cognitive evaluation in Jan 2021, however, patient said she did not want to go once the information for it arrived in the mail.  Consequently, the evaluation has not been completed.  Daughter would like to speak to Dr Quay Burow about options for mother re: dementia.  Appt scheduled (as f/u was due in July) for 9/7; daughter to accompany pt.

## 2020-03-09 ENCOUNTER — Other Ambulatory Visit: Payer: Self-pay | Admitting: Internal Medicine

## 2020-03-13 NOTE — Progress Notes (Signed)
Subjective:    Patient ID: Gabrielle Warren, female    DOB: [birth year], 86 y.o.   MRN: [id number]  HPI The patient is here for follow up of their chronic medical problems, including CAD, htn, hyperlipideamia, OP, anxiety, prediabetes, anemia.   Her daughter is here with her.  Gabrielle Warren called recently and was concerned about my care stating that I had never listened to her chest or examined her.   There has been concern over her memory for a couple of years.  She stated at her last visit 6 months ago her memory came and went.  She was referred to neuro in the past for further evaluation, but cancelled the appointment.   She no longer drives.  Her daughter helps with her medications.  She is able to dress herself and help prepare food.  She realizes that there is some memory issues, but is not sure if she wants this further evaluated or not.   Intermittent RLQ discomfort  - her daugheter thinks it is related to constipation - takes psyllium daily.  Takes stools softeners daily.  MOM prn.   Walks some.  Uses cane or rollator.  They have no other concerns.   Medications and allergies reviewed with patient and updated if appropriate.  Patient Active Problem List   Diagnosis Date Noted  . Murmur 09/15/2019  . Educated about COVID-19 virus infection 09/15/2019  . Decreased hearing 01/05/2019  . History of breast cancer 04/29/2018  . Urinary leakage 04/28/2018  . Malignant neoplasm of central portion of right breast in female, estrogen receptor positive ([place]) 09/02/2016  . Constipation 12/05/2015  . Macular degeneration 06/15/2015  . Back pain 06/15/2015  . Memory difficulties 06/15/2015  . Prediabetes 06/15/2015  . Breast cancer, right breast ([place]) 09/05/2014  . Allergic rhinitis 02/01/2014  . Anemia 09/22/2013  . Invasive ductal carcinoma of breast, stage 1 ([place]) 07/18/2011  . CAD (coronary artery disease) 04/26/2011  . Hyperlipidemia 07/03/2009  . BRUIT 07/21/2008  .  Anxiety 09/23/2007  . OSTEOARTHRITIS 09/23/2007  . Osteoporosis 09/23/2007  . Essential hypertension 07/25/2006    Current Outpatient Medications on File Prior to Visit  Medication Sig Dispense Refill  . amoxicillin (AMOXIL) 500 MG tablet TAKE 4 TABLETS BY MOUTH 1 HOUR PRIOR TO APPOINTMENT    . aspirin EC 81 MG tablet Take 81 mg by mouth at bedtime.    . bisacodyl (BISACODYL) 5 MG EC tablet Take 1 tablet (5 mg total) by mouth daily as needed for moderate constipation. 30 tablet 0  . Calcium Carb-Cholecalciferol [phone] MG-UNIT CAPS Take 1 capsule by mouth 2 (two) times daily.    . Cholecalciferol (VITAMIN D3) 50 MCG (2000 UT) TABS Take by mouth.    . diazepam (VALIUM) 10 MG tablet TAKE 1/2 TABLET BY MOUTH EVERY 12 HOURS AS NEEDED 30 tablet 2  . diltiazem (CARDIZEM CD) 120 MG 24 hr capsule TAKE 1 CAPSULE(120 MG) BY MOUTH DAILY 90 capsule 1  . losartan (COZAAR) 100 MG tablet TAKE 1 TABLET(100 MG) BY MOUTH DAILY 90 tablet 3  . metoprolol succinate (TOPROL-XL) 25 MG 24 hr tablet Take 1 tablet (25 mg) by mouth twice daily. 180 tablet 3  . Multiple Vitamin (MULTIVITAMIN WITH MINERALS) TABS tablet Take 1 tablet by mouth daily.    . Multiple Vitamins-Minerals (ICAPS AREDS FORMULA PO) Take 2 tablets by mouth.    . Omega-3 Fatty Acids (FISH OIL) 1200 MG CAPS Take 1 capsule by mouth daily.    . prazosin (  MINIPRESS) 1 MG capsule TAKE 2 CAPSULES BY MOUTH EVERY MORNING, 1 EVERY EVENING AND 2 AT BEDTIME 450 capsule 1  . psyllium (METAMUCIL) 58.6 % powder Take 1 packet by mouth 3 (three) times daily as needed.    . simvastatin (ZOCOR) 20 MG tablet TAKE 1 TABLET BY MOUTH EVERY DAY 90 tablet 1  . STOOL SOFTENER 100 MG capsule TAKE 2 CAPSULES(200 MG) BY MOUTH DAILY FOR CONSTIPATION 60 capsule 3   No current facility-administered medications on file prior to visit.    Past Medical History:  Diagnosis Date  . Anemia 12/10/2011  . ANXIETY 09/23/2007  . Arthritis   . Atrial tachycardia ([place])   . Breast  cancer ([place])   . HYPERLIPIDEMIA 07/03/2009  . HYPERTENSION 07/25/2006  . Macular degeneration   . MVP (mitral valve prolapse)   . Osteoarthritis   . Osteoporosis   . Prediabetes 06/15/2015    Past Surgical History:  Procedure Laterality Date  . BREAST LUMPECTOMY Right 08/08/2011  . BREAST SURGERY  08/08/11   lumpectomy  . CARPAL TUNNEL RELEASE  09/2004  . CATARACT EXTRACTION  04/2000, 03/2002    Social History   Socioeconomic History  . Marital status: Widowed    Spouse name: Not on file  . Number of children: 3  . Years of education: Not on file  . Highest education level: Not on file  Occupational History  . Occupation: Housekeeper  Tobacco Use  . Smoking status: Former Smoker    Quit date: 07/08/1994    Years since quitting: 25.7  . Smokeless tobacco: Never Used  Vaping Use  . Vaping Use: Never used  Substance and Sexual Activity  . Alcohol use: Yes    Comment: 1/2 can of beer with supper occ.  . Drug use: No  . Sexual activity: Not Currently    Partners: Male  Other Topics Concern  . Not on file  Social History Narrative  . Not on file   Social Determinants of Health   Financial Resource Strain:   . Difficulty of Paying Living Expenses: Not on file  Food Insecurity:   . Worried About Charity fundraiser in the Last Year: Not on file  . Ran Out of Food in the Last Year: Not on file  Transportation Needs:   . Lack of Transportation (Medical): Not on file  . Lack of Transportation (Non-Medical): Not on file  Physical Activity:   . Days of Exercise per Week: Not on file  . Minutes of Exercise per Session: Not on file  Stress:   . Feeling of Stress : Not on file  Social Connections:   . Frequency of Communication with Friends and Family: Not on file  . Frequency of Social Gatherings with Friends and Family: Not on file  . Attends Religious Services: Not on file  . Active Member of Clubs or Organizations: Not on file  . Attends Archivist Meetings:  Not on file  . Marital Status: Not on file    Family History  Problem Relation Age of Onset  . Heart failure Mother   . Stroke Father   . Hypertension Other   . Colon cancer Neg Hx     Review of Systems  Constitutional: Negative for appetite change, chills and fever.  Respiratory: Negative for cough, shortness of breath and wheezing.   Cardiovascular: Negative for chest pain, palpitations and leg swelling.  Gastrointestinal: Positive for abdominal pain and constipation. Negative for nausea.  No gerd  Neurological: Negative for light-headedness and headaches.  Psychiatric/Behavioral: Negative for dysphoric mood. The patient is not nervous/anxious.        Objective:   Vitals:   03/14/20 1454  BP: 130/62  Pulse: (!) 58  Temp: 98.1 F (36.7 C)  SpO2: 98%   BP Readings from Last 3 Encounters:  03/14/20 130/62  09/16/19 137/69  07/19/19 (!) 156/62   Wt Readings from Last 3 Encounters:  03/14/20 98 lb (44.5 kg)  09/16/19 99 lb 3.2 oz (45 kg)  07/19/19 97 lb 12.8 oz (44.4 kg)   Body mass index is 19.14 kg/m.   Physical Exam    Constitutional: Appears well-developed and well-nourished. No distress.  HENT:  Head: Normocephalic and atraumatic.  Neck: Neck supple. No tracheal deviation present. No thyromegaly present.  No cervical lymphadenopathy Cardiovascular: Normal rate, regular rhythm and normal heart sounds.   No murmur heard. No carotid bruit .  No edema Pulmonary/Chest: Effort normal and breath sounds normal. No respiratory distress. No has no wheezes. No rales.  Skin: Skin is warm and dry. Not diaphoretic.  Psychiatric: Normal mood and affect. Behavior is normal.      Assessment & Plan:    See Problem List for Assessment and Plan of chronic medical problems.    This visit occurred during the SARS-CoV-2 public health emergency.  Safety protocols were in place, including screening questions prior to the visit, additional usage of staff PPE, and  extensive cleaning of exam room while observing appropriate contact time as indicated for disinfecting solutions.

## 2020-03-14 ENCOUNTER — Ambulatory Visit (INDEPENDENT_AMBULATORY_CARE_PROVIDER_SITE_OTHER): Payer: Medicare Other | Admitting: Internal Medicine

## 2020-03-14 ENCOUNTER — Other Ambulatory Visit: Payer: Self-pay

## 2020-03-14 ENCOUNTER — Encounter: Payer: Self-pay | Admitting: Internal Medicine

## 2020-03-14 VITALS — BP 130/62 | HR 58 | Temp 98.1°F | Wt 98.0 lb

## 2020-03-14 DIAGNOSIS — E7849 Other hyperlipidemia: Secondary | ICD-10-CM

## 2020-03-14 DIAGNOSIS — F419 Anxiety disorder, unspecified: Secondary | ICD-10-CM

## 2020-03-14 DIAGNOSIS — K5900 Constipation, unspecified: Secondary | ICD-10-CM

## 2020-03-14 DIAGNOSIS — I10 Essential (primary) hypertension: Secondary | ICD-10-CM

## 2020-03-14 DIAGNOSIS — I2510 Atherosclerotic heart disease of native coronary artery without angina pectoris: Secondary | ICD-10-CM | POA: Diagnosis not present

## 2020-03-14 DIAGNOSIS — R413 Other amnesia: Secondary | ICD-10-CM | POA: Diagnosis not present

## 2020-03-14 DIAGNOSIS — R7303 Prediabetes: Secondary | ICD-10-CM

## 2020-03-14 MED ORDER — DIAZEPAM 5 MG PO TABS: 5.0000 mg | ORAL_TABLET | Freq: Two times a day (BID) | ORAL | 0 refills | Status: DC

## 2020-03-14 NOTE — Assessment & Plan Note (Signed)
Chronic No concerning symptoms suggestive of angina Continue current medications

## 2020-03-14 NOTE — Assessment & Plan Note (Signed)
Chronic Sugars have been well controlled Not concerned about this

## 2020-03-14 NOTE — Assessment & Plan Note (Signed)
Chronic Has been controlled She has been on Valium for years I do not think she needs this as much as she thinks she needs this She agreed to decrease the dose and see how we do Decrease Valium to 2.5 mg twice daily Consider decreasing further in near future

## 2020-03-14 NOTE — Assessment & Plan Note (Signed)
Chronic Check lipid panel  Continue daily statin Regular exercise and healthy diet encouraged  

## 2020-03-14 NOTE — Assessment & Plan Note (Addendum)
Chronic BP well controlled Current regimen effective and well tolerated Continue current medications at current doses CBC, CMP

## 2020-03-14 NOTE — Patient Instructions (Signed)
  Blood work was ordered.     Medications reviewed and updated.  Changes include :   Decrease valium to 2.5 mg twice daily.   Your prescription(s) have been submitted to your pharmacy. Please take as directed and contact our office if you believe you are having problem(s) with the medication(s).  A referral was ordered for Neurology - Dr Delice Lesch.       Someone from their office will call you to schedule an appointment.    Please followup in 6 months

## 2020-03-14 NOTE — Assessment & Plan Note (Signed)
Chronic She has had some memory issues for a while Overall doing well living with her daughter She no longer drives and her daughter helps manage her medications She is able to dress herself and do most of her ADLs Discussed further evaluation and possibly considering medication-she is not sure if she wants to do this Will order the referral and they can discuss.  They can always cancel the appointment if not needed  Check TSH, B12 level

## 2020-03-14 NOTE — Assessment & Plan Note (Signed)
Chronic Has intermittent associated right lower quadrant discomfort Discomfort improved with psyllium Continue stool softeners daily Milk of magnesia as needed

## 2020-03-15 ENCOUNTER — Encounter: Payer: Self-pay | Admitting: Neurology

## 2020-03-15 LAB — CBC WITH DIFFERENTIAL/PLATELET
Absolute Monocytes: 1064 cells/uL — ABNORMAL HIGH (ref 200–950)
Basophils Absolute: 68 cells/uL (ref 0–200)
Basophils Relative: 0.9 %
Eosinophils Absolute: 289 cells/uL (ref 15–500)
Eosinophils Relative: 3.8 %
HCT: 29.7 % — ABNORMAL LOW (ref 35.0–45.0)
Hemoglobin: 9.9 g/dL — ABNORMAL LOW (ref 11.7–15.5)
Lymphs Abs: 1991 cells/uL (ref 850–3900)
MCH: 30.8 pg (ref 27.0–33.0)
MCHC: 33.3 g/dL (ref 32.0–36.0)
MCV: 92.5 fL (ref 80.0–100.0)
MPV: 10.6 fL (ref 7.5–12.5)
Monocytes Relative: 14 %
Neutro Abs: 4188 cells/uL (ref 1500–7800)
Neutrophils Relative %: 55.1 %
Platelets: 216 10*3/uL (ref 140–400)
RBC: 3.21 10*6/uL — ABNORMAL LOW (ref 3.80–5.10)
RDW: 12.3 % (ref 11.0–15.0)
Total Lymphocyte: 26.2 %
WBC: 7.6 10*3/uL (ref 3.8–10.8)

## 2020-03-15 LAB — COMPLETE METABOLIC PANEL WITH GFR
AG Ratio: 1.4 (calc) (ref 1.0–2.5)
ALT: 10 U/L (ref 6–29)
AST: 16 U/L (ref 10–35)
Albumin: 3.7 g/dL (ref 3.6–5.1)
Alkaline phosphatase (APISO): 88 U/L (ref 37–153)
BUN/Creatinine Ratio: 17 (calc) (ref 6–22)
BUN: 15 mg/dL (ref 7–25)
CO2: 30 mmol/L (ref 20–32)
Calcium: 9.1 mg/dL (ref 8.6–10.4)
Chloride: 97 mmol/L — ABNORMAL LOW (ref 98–110)
Creat: 0.9 mg/dL — ABNORMAL HIGH (ref 0.60–0.88)
GFR, Est African American: 65 mL/min/{1.73_m2} (ref >=60)
GFR, Est Non African American: 56 mL/min/{1.73_m2} — ABNORMAL LOW (ref >=60)
Globulin: 2.7 g/dL (calc) (ref 1.9–3.7)
Glucose, Bld: 103 mg/dL — ABNORMAL HIGH (ref 65–99)
Potassium: 4.3 mmol/L (ref 3.5–5.3)
Sodium: 132 mmol/L — ABNORMAL LOW (ref 135–146)
Total Bilirubin: 0.3 mg/dL (ref 0.2–1.2)
Total Protein: 6.4 g/dL (ref 6.1–8.1)

## 2020-03-15 LAB — LIPID PANEL
Cholesterol: 121 mg/dL (ref <200)
HDL: 52 mg/dL (ref >=50)
LDL Cholesterol (Calc): 47 mg/dL (calc)
Non-HDL Cholesterol (Calc): 69 mg/dL (calc) (ref <130)
Total CHOL/HDL Ratio: 2.3 (calc) (ref <5.0)
Triglycerides: 140 mg/dL (ref <150)

## 2020-03-15 LAB — TSH: TSH: 3.4 mIU/L (ref 0.40–4.50)

## 2020-03-15 LAB — VITAMIN B12: Vitamin B-12: 408 pg/mL (ref 200–1100)

## 2020-03-16 ENCOUNTER — Other Ambulatory Visit: Payer: Self-pay | Admitting: Internal Medicine

## 2020-03-16 DIAGNOSIS — D649 Anemia, unspecified: Secondary | ICD-10-CM

## 2020-04-03 ENCOUNTER — Other Ambulatory Visit: Payer: Self-pay | Admitting: Internal Medicine

## 2020-04-03 MED ORDER — DIAZEPAM 5 MG PO TABS
2.5000 mg | ORAL_TABLET | Freq: Two times a day (BID) | ORAL | 0 refills | Status: DC
Start: 2020-04-03 — End: 2020-05-01

## 2020-04-03 NOTE — Telephone Encounter (Signed)
Check [place] registry last filled 02/28/2020.Marland KitchenJohny Warren

## 2020-04-10 ENCOUNTER — Telehealth: Payer: Self-pay

## 2020-04-10 NOTE — Telephone Encounter (Signed)
Pt called wanting to make sure Dr Quay Burow didn't scheduled a mammogram with her.  She also stated that she didn't want to see Dr Quay Burow anymore b/c "she doesn't wear a stethoscope".  I reminded her that she saw Dr Quay Burow on 9/7 & that there were extensive notes about her care/treatment.  She then stated "I don't know, then".  I asked to speak to her daughter & she was unable to get her daughter on the phone.  Daughter states she will call back.

## 2020-04-11 ENCOUNTER — Encounter (HOSPITAL_COMMUNITY): Payer: Self-pay

## 2020-04-11 ENCOUNTER — Emergency Department (HOSPITAL_COMMUNITY): Payer: Medicare Other

## 2020-04-11 ENCOUNTER — Emergency Department (HOSPITAL_COMMUNITY)
Admission: EM | Admit: 2020-04-11 | Discharge: 2020-04-11 | Disposition: A | Discharge status: Home / Self Care | Payer: Medicare Other | Attending: Emergency Medicine | Admitting: Emergency Medicine

## 2020-04-11 ENCOUNTER — Other Ambulatory Visit: Payer: Self-pay

## 2020-04-11 DIAGNOSIS — I2510 Atherosclerotic heart disease of native coronary artery without angina pectoris: Secondary | ICD-10-CM | POA: Diagnosis not present

## 2020-04-11 DIAGNOSIS — R42 Dizziness and giddiness: Secondary | ICD-10-CM | POA: Insufficient documentation

## 2020-04-11 DIAGNOSIS — Z853 Personal history of malignant neoplasm of breast: Secondary | ICD-10-CM | POA: Diagnosis not present

## 2020-04-11 DIAGNOSIS — Z79899 Other long term (current) drug therapy: Secondary | ICD-10-CM | POA: Insufficient documentation

## 2020-04-11 DIAGNOSIS — Z7982 Long term (current) use of aspirin: Secondary | ICD-10-CM | POA: Diagnosis not present

## 2020-04-11 DIAGNOSIS — I10 Essential (primary) hypertension: Secondary | ICD-10-CM | POA: Diagnosis not present

## 2020-04-11 DIAGNOSIS — Z87891 Personal history of nicotine dependence: Secondary | ICD-10-CM | POA: Diagnosis not present

## 2020-04-11 LAB — DIFFERENTIAL
Abs Immature Granulocytes: 0.02 10*3/uL (ref 0.00–0.07)
Basophils Absolute: 0 10*3/uL (ref 0.0–0.1)
Basophils Relative: 1 %
Eosinophils Absolute: 0.1 10*3/uL (ref 0.0–0.5)
Eosinophils Relative: 2 %
Immature Granulocytes: 0 %
Lymphocytes Relative: 19 %
Lymphs Abs: 1.5 10*3/uL (ref 0.7–4.0)
Monocytes Absolute: 0.9 10*3/uL (ref 0.1–1.0)
Monocytes Relative: 11 %
Neutro Abs: 5.3 10*3/uL (ref 1.7–7.7)
Neutrophils Relative %: 67 %

## 2020-04-11 LAB — COMPREHENSIVE METABOLIC PANEL
ALT: 19 U/L (ref 0–44)
AST: 23 U/L (ref 15–41)
Albumin: 3.6 g/dL (ref 3.5–5.0)
Alkaline Phosphatase: 75 U/L (ref 38–126)
Anion gap: 10 (ref 5–15)
BUN: 11 mg/dL (ref 8–23)
CO2: 26 mmol/L (ref 22–32)
Calcium: 9.3 mg/dL (ref 8.9–10.3)
Chloride: 97 mmol/L — ABNORMAL LOW (ref 98–111)
Creatinine, Ser: 0.85 mg/dL (ref 0.44–1.00)
GFR calc non Af Amer: 60 mL/min — ABNORMAL LOW (ref >60)
Glucose, Bld: 115 mg/dL — ABNORMAL HIGH (ref 70–99)
Potassium: 3.6 mmol/L (ref 3.5–5.1)
Sodium: 133 mmol/L — ABNORMAL LOW (ref 135–145)
Total Bilirubin: 0.5 mg/dL (ref 0.3–1.2)
Total Protein: 6.9 g/dL (ref 6.5–8.1)

## 2020-04-11 LAB — I-STAT CHEM 8, ED
BUN: 11 mg/dL (ref 8–23)
Calcium, Ion: 1.19 mmol/L (ref 1.15–1.40)
Chloride: 95 mmol/L — ABNORMAL LOW (ref 98–111)
Creatinine, Ser: 0.8 mg/dL (ref 0.44–1.00)
Glucose, Bld: 111 mg/dL — ABNORMAL HIGH (ref 70–99)
HCT: 32 % — ABNORMAL LOW (ref 36.0–46.0)
Hemoglobin: 10.9 g/dL — ABNORMAL LOW (ref 12.0–15.0)
Potassium: 3.5 mmol/L (ref 3.5–5.1)
Sodium: 133 mmol/L — ABNORMAL LOW (ref 135–145)
TCO2: 27 mmol/L (ref 22–32)

## 2020-04-11 LAB — CBC
HCT: 31.2 % — ABNORMAL LOW (ref 36.0–46.0)
Hemoglobin: 10.3 g/dL — ABNORMAL LOW (ref 12.0–15.0)
MCH: 30.2 pg (ref 26.0–34.0)
MCHC: 33 g/dL (ref 30.0–36.0)
MCV: 91.5 fL (ref 80.0–100.0)
Platelets: 201 10*3/uL (ref 150–400)
RBC: 3.41 MIL/uL — ABNORMAL LOW (ref 3.87–5.11)
RDW: 12.9 % (ref 11.5–15.5)
WBC: 7.9 10*3/uL (ref 4.0–10.5)
nRBC: 0 % (ref 0.0–0.2)

## 2020-04-11 LAB — PROTIME-INR
INR: 1.1 (ref 0.8–1.2)
Prothrombin Time: 13.8 seconds (ref 11.4–15.2)

## 2020-04-11 LAB — APTT: aPTT: 35 seconds (ref 24–36)

## 2020-04-11 MED ORDER — SODIUM CHLORIDE 0.9% FLUSH
3.0000 mL | Freq: Once | INTRAVENOUS | Status: DC
Start: 2020-04-11 — End: 2020-04-12

## 2020-04-11 NOTE — ED Triage Notes (Signed)
Pt presents to ED with complaints of sudden onset dizziness and SOB that began around 1630 while she was sitting in car waiting for daughter for short period of time.   260/108 Hr 78 spo2 98 rr 14 cbg 113

## 2020-04-11 NOTE — ED Provider Notes (Signed)
[facility] EMERGENCY DEPARTMENT Provider Note   CSN: [id number] Arrival date & time: 04/11/20  1823     History Chief Complaint  Patient presents with  . Dizziness  . Hypertension    Gabrielle Warren is a 86 y.o. female.  This is32 year old female with prior medical history as detailed below presents for evaluation of transient episode of dizziness.  Patient reports that she rode with her daughter to the store.  She was in the car for approximately half an hour.  Upon her return she reported feeling dizzy.  Venous lasted approximately 30 minutes.  She is now asymptomatic.  She denies other complaint.  She does report that her blood pressure was elevated when she was symptomatic.  She reports that she did not yet take her evening doses of antihypertensives.  She denies associated chest pain, shortness of breath, headache, visual change, focal weakness, nausea, vomiting, or other specific complaint.  The history is provided by the patient.  Dizziness Quality:  Head spinning Severity:  Mild Onset quality:  Sudden Duration:  1 hour Timing:  Unable to specify Progression:  Resolved Chronicity:  New Relieved by:  Nothing Worsened by:  Nothing Associated symptoms: no chest pain, no palpitations and no shortness of breath   Hypertension Pertinent negatives include no chest pain and no shortness of breath.       Past Medical History:  Diagnosis Date  . Anemia 12/10/2011  . ANXIETY 09/23/2007  . Arthritis   . Atrial tachycardia ([place])   . Breast cancer ([place])   . HYPERLIPIDEMIA 07/03/2009  . HYPERTENSION 07/25/2006  . Macular degeneration   . MVP (mitral valve prolapse)   . Osteoarthritis   . Osteoporosis   . Prediabetes 06/15/2015    Patient Active Problem List   Diagnosis Date Noted  . Murmur 09/15/2019  . Educated about COVID-19 virus infection 09/15/2019  . Decreased hearing 01/05/2019  . History of breast cancer 04/29/2018  . Urinary leakage  04/28/2018  . Malignant neoplasm of central portion of right breast in female, estrogen receptor positive ([place]) 09/02/2016  . Constipation 12/05/2015  . Macular degeneration 06/15/2015  . Back pain 06/15/2015  . Memory difficulties 06/15/2015  . Prediabetes 06/15/2015  . Breast cancer, right breast ([place]) 09/05/2014  . Allergic rhinitis 02/01/2014  . Anemia 09/22/2013  . Invasive ductal carcinoma of breast, stage 1 ([place]) 07/18/2011  . CAD (coronary artery disease) 04/26/2011  . Hyperlipidemia 07/03/2009  . BRUIT 07/21/2008  . Anxiety 09/23/2007  . OSTEOARTHRITIS 09/23/2007  . Osteoporosis 09/23/2007  . Essential hypertension 07/25/2006    Past Surgical History:  Procedure Laterality Date  . BREAST LUMPECTOMY Right 08/08/2011  . BREAST SURGERY  08/08/11   lumpectomy  . CARPAL TUNNEL RELEASE  09/2004  . CATARACT EXTRACTION  04/2000, 03/2002     OB History   No obstetric history on file.     Family History  Problem Relation Age of Onset  . Heart failure Mother   . Stroke Father   . Hypertension Other   . Colon cancer Neg Hx     Social History   Tobacco Use  . Smoking status: Former Smoker    Quit date: 07/08/1994    Years since quitting: 25.7  . Smokeless tobacco: Never Used  Vaping Use  . Vaping Use: Never used  Substance Use Topics  . Alcohol use: Yes    Comment: 1/2 can of beer with supper occ.  . Drug use: No    Home  Medications Prior to Admission medications   Medication Sig Start Date End Date Taking? Authorizing Provider  amoxicillin (AMOXIL) 500 MG tablet TAKE 4 TABLETS BY MOUTH 1 HOUR PRIOR TO APPOINTMENT 09/29/19   [provider]  aspirin EC 81 MG tablet Take 81 mg by mouth at bedtime.    [provider]  bisacodyl (BISACODYL) 5 MG EC tablet Take 1 tablet (5 mg total) by mouth daily as needed for moderate constipation. 04/19/19   Binnie Rail, MD  Calcium Carb-Cholecalciferol [phone] MG-UNIT CAPS Take 1 capsule by mouth 2 (two)  times daily.    [provider]  Cholecalciferol (VITAMIN D3) 50 MCG (2000 UT) TABS Take by mouth.    [provider]  diazepam (VALIUM) 5 MG tablet Take 0.5 tablets (2.5 mg total) by mouth 2 (two) times daily. 04/03/20   Binnie Rail, MD  diltiazem (CARDIZEM CD) 120 MG 24 hr capsule TAKE 1 CAPSULE(120 MG) BY MOUTH DAILY 01/24/20   Binnie Rail, MD  losartan (COZAAR) 100 MG tablet TAKE 1 TABLET(100 MG) BY MOUTH DAILY 10/15/19   Minus Breeding, MD  metoprolol succinate (TOPROL-XL) 25 MG 24 hr tablet Take 1 tablet (25 mg) by mouth twice daily. 10/19/19   Minus Breeding, MD  Multiple Vitamin (MULTIVITAMIN WITH MINERALS) TABS tablet Take 1 tablet by mouth daily.    [provider]  Multiple Vitamins-Minerals (ICAPS AREDS FORMULA PO) Take 2 tablets by mouth.    [provider]  Omega-3 Fatty Acids (FISH OIL) 1200 MG CAPS Take 1 capsule by mouth daily.    [provider]  prazosin (MINIPRESS) 1 MG capsule TAKE 2 CAPSULES BY MOUTH EVERY MORNING, 1 EVERY EVENING AND 2 AT BEDTIME 03/09/20   Burns, Claudina Lick, MD  psyllium (METAMUCIL) 58.6 % powder Take 1 packet by mouth 3 (three) times daily as needed.    [provider]  simvastatin (ZOCOR) 20 MG tablet TAKE 1 TABLET BY MOUTH EVERY DAY 03/09/20   Burns, Claudina Lick, MD  STOOL SOFTENER 100 MG capsule TAKE 2 CAPSULES(200 MG) BY MOUTH DAILY FOR CONSTIPATION 08/16/19   Burns, Claudina Lick, MD    Allergies    Fosamax [alendronate sodium]  Review of Systems   Review of Systems  Respiratory: Negative for shortness of breath.   Cardiovascular: Negative for chest pain and palpitations.  Neurological: Positive for dizziness.  All other systems reviewed and are negative.   Physical Exam Updated Vital Signs BP (!) 215/68 (BP Location: Right Arm)   Pulse 72   Temp 98.4 F (36.9 C) (Oral)   Resp 16   SpO2 99%   Physical Exam Vitals and nursing note reviewed.  Constitutional:      General: She is not in acute  distress.    Appearance: She is well-developed.  HENT:     Head: Normocephalic and atraumatic.  Eyes:     Conjunctiva/sclera: Conjunctivae normal.     Pupils: Pupils are equal, round, and reactive to light.  Cardiovascular:     Rate and Rhythm: Normal rate and regular rhythm.     Heart sounds: Normal heart sounds.  Pulmonary:     Effort: Pulmonary effort is normal. No respiratory distress.     Breath sounds: Normal breath sounds.  Abdominal:     General: There is no distension.     Palpations: Abdomen is soft.     Tenderness: There is no abdominal tenderness.  Musculoskeletal:        General: No deformity. Normal  range of motion.     Cervical back: Normal range of motion and neck supple.  Skin:    General: Skin is warm and dry.  Neurological:     General: No focal deficit present.     Mental Status: She is alert and oriented to person, place, and time. Mental status is at baseline.     Cranial Nerves: No cranial nerve deficit.     Sensory: No sensory deficit.     Motor: No weakness.     Coordination: Coordination normal.     ED Results / Procedures / Treatments   Labs (all labs ordered are listed, but only abnormal results are displayed) Labs Reviewed  CBC - Abnormal; Notable for the following components:      Result Value   RBC 3.41 (*)    Hemoglobin 10.3 (*)    HCT 31.2 (*)    All other components within normal limits  COMPREHENSIVE METABOLIC PANEL - Abnormal; Notable for the following components:   Sodium 133 (*)    Chloride 97 (*)    Glucose, Bld 115 (*)    GFR calc non Af Amer 60 (*)    All other components within normal limits  I-STAT CHEM 8, ED - Abnormal; Notable for the following components:   Sodium 133 (*)    Chloride 95 (*)    Glucose, Bld 111 (*)    Hemoglobin 10.9 (*)    HCT 32.0 (*)    All other components within normal limits  PROTIME-INR  APTT  DIFFERENTIAL  CBG MONITORING, ED    EKG EKG Interpretation  Date/Time:  Tuesday April 11 2020 18:57:44 EDT Ventricular Rate:  61 PR Interval:  166 QRS Duration: 76 QT Interval:  424 QTC Calculation: 426 R Axis:   64 Text Interpretation: Normal sinus rhythm with sinus arrhythmia Normal ECG Confirmed by Dene Gentry [phone]) on 04/11/2020 8:42:43 PM   Radiology CT HEAD WO CONTRAST  Result Date: 04/11/2020 CLINICAL DATA:  Dizziness and shortness of breath EXAM: CT HEAD WITHOUT CONTRAST TECHNIQUE: Contiguous axial images were obtained from the base of the skull through the vertex without intravenous contrast. COMPARISON:  None. FINDINGS: Brain: No evidence of acute territorial infarction, hemorrhage, hydrocephalus,extra-axial collection or mass lesion/mass effect. There is dilatation the ventricles and sulci consistent with age-related atrophy. Low-attenuation changes in the deep white matter consistent with small vessel ischemia. Vascular: No hyperdense vessel or unexpected calcification. Skull: The skull is intact. No fracture or focal lesion identified. Sinuses/Orbits: The visualized paranasal sinuses and mastoid air cells are clear. The orbits and globes intact. Other: None IMPRESSION: No acute intracranial abnormality. Findings consistent with age related atrophy and chronic small vessel ischemia Electronically Signed   By: Prudencio Pair M.D.   On: 04/11/2020 20:13    Procedures Procedures (including critical care time)  Medications Ordered in ED Medications  sodium chloride flush (NS) 0.9 % injection 3 mL (has no administration in time range)    ED Course  I have reviewed the triage vital signs and the nursing notes.  Pertinent labs & imaging results that were available during my care of the patient were reviewed by me and considered in my medical decision making (see chart for details).    MDM Rules/Calculators/A&P                          MDM  Screen complete  Gabrielle Warren was evaluated in Emergency Department on 04/11/2020 for the  symptoms described in  the history of present illness. She was evaluated in the context of the global COVID-19 pandemic, which necessitated consideration that the patient might be at risk for infection with the SARS-CoV-2 virus that causes COVID-19. Institutional protocols and algorithms that pertain to the evaluation of patients at risk for COVID-19 are in a state of rapid change based on information released by regulatory bodies including the CDC and federal and state organizations. These policies and algorithms were followed during the patient's care in the ED.   Patient is presenting for evaluation of reported transient episode of dizziness.  Symptoms resolved quickly.  She did not have associated syncopal symptoms.  She denied associated chest pain or shortness of breath.  Patient's work-up tonight is without significant abnormality.  Patient feels improved.  Patient does desire discharge home.  She does understand need for close follow-up.  Final Clinical Impression(s) / ED Diagnoses Final diagnoses:  Dizziness    Rx / DC Orders ED Discharge Orders    None       Valarie Merino, MD 04/11/20 2211

## 2020-04-11 NOTE — Discharge Instructions (Addendum)
Please return for any problem.  Follow-up with your regular care provider as instructed. °

## 2020-04-12 ENCOUNTER — Telehealth: Payer: Self-pay | Admitting: Cardiology

## 2020-04-12 ENCOUNTER — Telehealth: Payer: Self-pay | Admitting: Internal Medicine

## 2020-04-12 NOTE — Telephone Encounter (Signed)
That kind of BP needs to be evaluated and treated in the ED.

## 2020-04-12 NOTE — Telephone Encounter (Signed)
The ED note mentioned she had missed her BP medication - not sure if that is true or not.    One thing is make sure she is taking her meds daily and ideally check her BP at home so we can see if it is controlled or not.  Ok to set up a follow up for next week - she can call sooner if BP is elevated.

## 2020-04-12 NOTE — Telephone Encounter (Signed)
Called patient back to see if she had gone to the ER as advised. Patient did not go to ER, she is currently resting in bed. Advised her that Dr. Percival Spanish recommended that she go to ER.  I spoke with the daughter who states that they have given her her morning medications and she has since eaten breakfast. She is feeling a bit better and blood pressure is currently 135/60. The daughter states that they are not going to take her to the ER right now. Re-advised on recommendations per Dr. Percival Spanish.  Daughter states that they will continue to monitor her and if BP spikes again that they will take her.

## 2020-04-12 NOTE — Telephone Encounter (Signed)
Pt c/o BP issue: STAT if pt c/o blurred vision, one-sided weakness or slurred speech  1. What are your last 5 BP readings? 222/NA - states this is the only BP reading that keeps coming up  2. Are you having any other symptoms (ex. Dizziness, headache, blurred vision, passed out)? Dizziness, some chest pain, states she does not feel good this morning. Denies blurred vision, one sided weakness and slurred speech.    3. What is your BP issue? High BP, patient states she wants to be seen today.

## 2020-04-12 NOTE — Telephone Encounter (Signed)
Patient reports that yesterday she was sitting in the car waiting for her daughter when she became very dizzy. She states she took her blood pressure and top number was 222. She states that she called EMS and was taken to the ED. She sat in the ED for a long time. She was seen by a provider. EKG was normal. Patient started to feel better and decided to leave. Patient reports this morning that she is feeling weak. She reports BP of 247/198 and repeated BP of 223/136. Patient has not taken her BP medications this morning. I advised her that she needs to take her medications now. Patient denies any dizziness but states that she feels very weak. No current chest pain but states that she had some earlier. Patient would like to see Dr. Percival Spanish. Advised patient that he is not in the office until tomorrow but I could get her an appointment with another provider today. Patient states that she is going to go back to the ER.

## 2020-04-12 NOTE — Telephone Encounter (Signed)
Left message for patient's daughter  to return call to clinic to discuss.   Is there anything else you want to offer in the meantime?

## 2020-04-12 NOTE — Telephone Encounter (Signed)
Patients daughter called and was wanting to speak with Dr. Quay Burow' nurse. She said that her mother was taken to the hospital for high BP and she was offered an appointment for a HFU for 04/17/2020 but she asked to speak to the nurse. She states her mothers bp was in the 200 range.    [name] can be reached at [phone].

## 2020-04-13 NOTE — Telephone Encounter (Signed)
Spoke with patient's daughter and info given. Appointment made for her to come in next Tuesday for follow up. She will bring log of blood pressure readings.

## 2020-04-17 NOTE — Progress Notes (Signed)
Subjective:    Patient ID: Gabrielle Warren, female    DOB: 01-31-1926, 86 y.o.   MRN: [id number]  HPI The patient is here for follow up from the ED. she is here with her daughter.  ED 10/5 for dizziness and hypertension. She had a transient episode of dizziness.  She was in the car for 30 minutes when her duaghter was in a stone and she reported dizziness when her daughter returned.  It lasted 30 min. She was asymptomatic in the ED.  No other complaints. She had not taken her evening BP meds, but had not missed any other doses of medications.  BP was elevated - 215/68, 223/136, 247/198. CT head, EKG, cbc, cmp unremarkable. Exam WNL.    BP athome 135/60, 165/74, 173/79, 152/75, 169/66, 136/59.  She is taking her medication daily, her daughter collaborates.  She denies any dizziness since that one episode.  She denies lightheadedness, chest pain, palpitations and shortness of breath.  Medications and allergies reviewed with patient and updated if appropriate.  Patient Active Problem List   Diagnosis Date Noted  . Murmur 09/15/2019  . Educated about COVID-19 virus infection 09/15/2019  . Decreased hearing 01/05/2019  . History of breast cancer 04/29/2018  . Urinary leakage 04/28/2018  . Malignant neoplasm of central portion of right breast in female, estrogen receptor positive ([place]) 09/02/2016  . Constipation 12/05/2015  . Macular degeneration 06/15/2015  . Back pain 06/15/2015  . Memory difficulties 06/15/2015  . Prediabetes 06/15/2015  . Breast cancer, right breast ([place]) 09/05/2014  . Allergic rhinitis 02/01/2014  . Anemia 09/22/2013  . Invasive ductal carcinoma of breast, stage 1 ([place]) 07/18/2011  . CAD (coronary artery disease) 04/26/2011  . Hyperlipidemia 07/03/2009  . BRUIT 07/21/2008  . Anxiety 09/23/2007  . OSTEOARTHRITIS 09/23/2007  . Osteoporosis 09/23/2007  . Essential hypertension 07/25/2006    Current Outpatient Medications on File Prior to Visit    Medication Sig Dispense Refill  . amoxicillin (AMOXIL) 500 MG tablet TAKE 4 TABLETS BY MOUTH 1 HOUR PRIOR TO APPOINTMENT    . aspirin EC 81 MG tablet Take 81 mg by mouth at bedtime.    . bisacodyl (BISACODYL) 5 MG EC tablet Take 1 tablet (5 mg total) by mouth daily as needed for moderate constipation. 30 tablet 0  . Calcium Carb-Cholecalciferol [phone] MG-UNIT CAPS Take 1 capsule by mouth 2 (two) times daily.    . Cholecalciferol (VITAMIN D3) 50 MCG (2000 UT) TABS Take by mouth.    . diazepam (VALIUM) 5 MG tablet Take 0.5 tablets (2.5 mg total) by mouth 2 (two) times daily. 30 tablet 0  . diltiazem (CARDIZEM CD) 120 MG 24 hr capsule TAKE 1 CAPSULE(120 MG) BY MOUTH DAILY 90 capsule 1  . losartan (COZAAR) 100 MG tablet TAKE 1 TABLET(100 MG) BY MOUTH DAILY 90 tablet 3  . metoprolol succinate (TOPROL-XL) 25 MG 24 hr tablet Take 1 tablet (25 mg) by mouth twice daily. 180 tablet 3  . Multiple Vitamin (MULTIVITAMIN WITH MINERALS) TABS tablet Take 1 tablet by mouth daily.    . Multiple Vitamins-Minerals (ICAPS AREDS FORMULA PO) Take 2 tablets by mouth.    . Omega-3 Fatty Acids (FISH OIL) 1200 MG CAPS Take 1 capsule by mouth daily.    . prazosin (MINIPRESS) 1 MG capsule TAKE 2 CAPSULES BY MOUTH EVERY MORNING, 1 EVERY EVENING AND 2 AT BEDTIME 450 capsule 1  . psyllium (METAMUCIL) 58.6 % powder Take 1 packet by mouth 3 (three) times daily  as needed.    . simvastatin (ZOCOR) 20 MG tablet TAKE 1 TABLET BY MOUTH EVERY DAY 90 tablet 1  . STOOL SOFTENER 100 MG capsule TAKE 2 CAPSULES(200 MG) BY MOUTH DAILY FOR CONSTIPATION 60 capsule 3   No current facility-administered medications on file prior to visit.    Past Medical History:  Diagnosis Date  . Anemia 12/10/2011  . ANXIETY 09/23/2007  . Arthritis   . Atrial tachycardia ([place])   . Breast cancer ([place])   . HYPERLIPIDEMIA 07/03/2009  . HYPERTENSION 07/25/2006  . Macular degeneration   . MVP (mitral valve prolapse)   . Osteoarthritis   . Osteoporosis    . Prediabetes 06/15/2015    Past Surgical History:  Procedure Laterality Date  . BREAST LUMPECTOMY Right 08/08/2011  . BREAST SURGERY  08/08/11   lumpectomy  . CARPAL TUNNEL RELEASE  09/2004  . CATARACT EXTRACTION  04/2000, 03/2002    Social History   Socioeconomic History  . Marital status: Widowed    Spouse name: Not on file  . Number of children: 3  . Years of education: Not on file  . Highest education level: Not on file  Occupational History  . Occupation: Housekeeper  Tobacco Use  . Smoking status: Former Smoker    Quit date: 07/08/1994    Years since quitting: 25.7  . Smokeless tobacco: Never Used  Vaping Use  . Vaping Use: Never used  Substance and Sexual Activity  . Alcohol use: Yes    Comment: 1/2 can of beer with supper occ.  . Drug use: No  . Sexual activity: Not Currently    Partners: Male  Other Topics Concern  . Not on file  Social History Narrative  . Not on file   Social Determinants of Health   Financial Resource Strain:   . Difficulty of Paying Living Expenses: Not on file  Food Insecurity:   . Worried About Charity fundraiser in the Last Year: Not on file  . Ran Out of Food in the Last Year: Not on file  Transportation Needs:   . Lack of Transportation (Medical): Not on file  . Lack of Transportation (Non-Medical): Not on file  Physical Activity:   . Days of Exercise per Week: Not on file  . Minutes of Exercise per Session: Not on file  Stress:   . Feeling of Stress : Not on file  Social Connections:   . Frequency of Communication with Friends and Family: Not on file  . Frequency of Social Gatherings with Friends and Family: Not on file  . Attends Religious Services: Not on file  . Active Member of Clubs or Organizations: Not on file  . Attends Archivist Meetings: Not on file  . Marital Status: Not on file    Family History  Problem Relation Age of Onset  . Heart failure Mother   . Stroke Father   . Hypertension Other    . Colon cancer Neg Hx     Review of Systems  Constitutional: Negative for fever.  Respiratory: Negative for shortness of breath.   Cardiovascular: Negative for chest pain, palpitations and leg swelling.  Neurological: Negative for dizziness, light-headedness and headaches.       Objective:   Vitals:   04/18/20 1443  BP: (!) 168/78  Pulse: 74  Temp: 98.1 F (36.7 C)  SpO2: 97%   BP Readings from Last 3 Encounters:  04/18/20 (!) 168/78  04/11/20 (!) 210/68  03/14/20 130/62   Wt  Readings from Last 3 Encounters:  04/18/20 97 lb (44 kg)  03/14/20 98 lb (44.5 kg)  09/16/19 99 lb 3.2 oz (45 kg)   Body mass index is 18.94 kg/m.   Physical Exam    Constitutional: Appears well-developed and well-nourished. No distress.  HENT:  Head: Normocephalic and atraumatic.  Neck: Neck supple. No tracheal deviation present. No thyromegaly present.  No cervical lymphadenopathy Cardiovascular: Normal rate, regular rhythm and normal heart sounds.   2/6 systolic murmur heard. No carotid bruit .  No edema Pulmonary/Chest: Effort normal and breath sounds normal. No respiratory distress. No has no wheezes. No rales.  Skin: Skin is warm and dry. Not diaphoretic.  Psychiatric: Normal mood and affect. Behavior is normal.      Assessment & Plan:    See Problem List for Assessment and Plan of chronic medical problems.    This visit occurred during the SARS-CoV-2 public health emergency.  Safety protocols were in place, including screening questions prior to the visit, additional usage of staff PPE, and extensive cleaning of exam room while observing appropriate contact time as indicated for disinfecting solutions.

## 2020-04-18 ENCOUNTER — Other Ambulatory Visit: Payer: Self-pay

## 2020-04-18 ENCOUNTER — Ambulatory Visit (INDEPENDENT_AMBULATORY_CARE_PROVIDER_SITE_OTHER): Payer: Medicare Other | Admitting: Internal Medicine

## 2020-04-18 ENCOUNTER — Encounter: Payer: Self-pay | Admitting: Internal Medicine

## 2020-04-18 VITALS — BP 168/78 | HR 74 | Temp 98.1°F | Ht 60.0 in | Wt 97.0 lb

## 2020-04-18 DIAGNOSIS — I10 Essential (primary) hypertension: Secondary | ICD-10-CM | POA: Diagnosis not present

## 2020-04-18 DIAGNOSIS — F419 Anxiety disorder, unspecified: Secondary | ICD-10-CM

## 2020-04-18 DIAGNOSIS — I2510 Atherosclerotic heart disease of native coronary artery without angina pectoris: Secondary | ICD-10-CM

## 2020-04-18 MED ORDER — SERTRALINE HCL 25 MG PO TABS: 25.0000 mg | ORAL_TABLET | Freq: Every day | ORAL | 1 refills | Status: DC

## 2020-04-18 MED ORDER — PRAZOSIN HCL 2 MG PO CAPS: 4.0000 mg | ORAL_CAPSULE | Freq: Two times a day (BID) | ORAL | 5 refills | Status: DC

## 2020-04-18 NOTE — Patient Instructions (Addendum)
  Medications reviewed and updated.  Changes include :     Change prazosin to 4 mg twice daily  Start sertraline 25 mg daily   Your prescription(s) have been submitted to your pharmacy. Please take as directed and contact our office if you believe you are having problem(s) with the medication(s).

## 2020-04-18 NOTE — Assessment & Plan Note (Signed)
Chronic Blood pressure not ideally controlled, but given her age we do need to be careful about over controlling it We will continue diltiazem 120 mg daily, losartan 100 mg daily and metoprolol XL 25 mg twice daily Increase prazosin to 4 mg twice daily.  Discussed with her and her daughter to monitor for lightheadedness or dizziness, especially with standing She will continue to monitor her blood pressure once or twice a day and her daughter can update me via MyChart Discussed that we can further adjust medication as needed

## 2020-04-18 NOTE — Assessment & Plan Note (Signed)
Chronic She has had some generalized anxiety for a while and for years has been on Valium-low-dose twice daily Her daughter wonders if anxiety could be contributing to some of her blood pressure at least when she was in the hospital.  She does seem to have some mild generalized anxiety  we will start sertraline 25 mg daily Cont twice daily Advised her daughter to let me know if this is not helping so we can adjust if needed inue Valium 2.5 mg

## 2020-04-24 ENCOUNTER — Telehealth: Payer: Self-pay | Admitting: Cardiology

## 2020-04-24 NOTE — Telephone Encounter (Signed)
Pt c/o BP issue: STAT if pt c/o blurred vision, one-sided weakness or slurred speech  1. What are your last 5 BP readings? Saturday 151/63 pulse 57,sunday 154/61 pulse 56, [place] 145/62 pulse   2. Are you having any other symptoms (ex. Dizziness, headache, blurred vision, passed out)? No  3. What is your BP issue? That her BP is going up a and down. Patient wanted a apt today I offered a opening for tomorrow. Patient would like for some one to call her back.

## 2020-04-24 NOTE — Telephone Encounter (Signed)
Patient is calling in to report her recent blood pressures. She states she would like to be seen by Dr. Percival Spanish so that he can review her medications. She denies any symptoms such as CP, SOB or swelling.   Saturday 151/63 pulse 57, Sunday 154/61 pulse 56, Today 145/62   Appointment made for 10/19 4:20pm with [facility].

## 2020-04-25 ENCOUNTER — Ambulatory Visit (INDEPENDENT_AMBULATORY_CARE_PROVIDER_SITE_OTHER): Payer: Medicare Other | Admitting: Cardiology

## 2020-04-25 ENCOUNTER — Other Ambulatory Visit: Payer: Self-pay

## 2020-04-25 ENCOUNTER — Encounter: Payer: Self-pay | Admitting: Cardiology

## 2020-04-25 VITALS — BP 158/56 | HR 60 | Ht 60.0 in | Wt 96.0 lb

## 2020-04-25 DIAGNOSIS — I160 Hypertensive urgency: Secondary | ICD-10-CM | POA: Diagnosis not present

## 2020-04-25 DIAGNOSIS — I2510 Atherosclerotic heart disease of native coronary artery without angina pectoris: Secondary | ICD-10-CM | POA: Diagnosis not present

## 2020-04-25 NOTE — Patient Instructions (Signed)
Medication Instructions:  No changes *If you need a refill on your cardiac medications before your next appointment, please call your pharmacy*   Lab Work: None ordered If you have labs (blood work) drawn today and your tests are completely normal, you will receive your results only by: . MyChart Message (if you have MyChart) OR . A paper copy in the mail If you have any lab test that is abnormal or we need to change your treatment, we will call you to review the results.   Testing/Procedures: None ordered   Follow-Up: At [facility], you and your health needs are our priority.  As part of our continuing mission to provide you with exceptional heart care, we have created designated Provider Care Teams.  These Care Teams include your primary Cardiologist (physician) and Advanced Practice Providers (APPs -  Physician Assistants and Nurse Practitioners) who all work together to provide you with the care you need, when you need it.  We recommend signing up for the patient portal called "MyChart".  Sign up information is provided on this After Visit Summary.  MyChart is used to connect with patients for Virtual Visits (Telemedicine).  Patients are able to view lab/test results, encounter notes, upcoming appointments, etc.  Non-urgent messages can be sent to your provider as well.   To learn more about what you can do with MyChart, go to [email or web address].    Your next appointment:   12 month(s)  The format for your next appointment:   In Person  Provider:   You may see Dr. Hochrein or one of the following Advanced Practice Providers on your designated Care Team:    Rhonda Barrett, PA-C  Kathryn Lawrence, DNP, ANP     

## 2020-04-25 NOTE — Progress Notes (Signed)
Cardiology Office Note   Date:  04/25/2020   ID:  Gabrielle Warren, DOB Nov 14, 1925, MRN [id number]  PCP:  Gabrielle Rail, MD  Cardiologist:   No primary care provider on file.   Chief Complaint  Patient presents with  . Hypertensive Urgency      History of Present Illness: Gabrielle Warren is a 86 y.o. female who presents for evaluation of hypertension.  Since I last saw her she was in the ED earlier this month for dizziness.  I reviewed these records for this visit.   Head CT was unremarkable.   She was hypertensive initially but this seems to have resolved without treatment.    I explored the situation with her daughter.  There is been memory disorder and she has been anxious.  Is quite possible she got anxious in the car when this event happened.  Her daughter was in the store and she got dizzy found to be hypertensive.  I reviewed some blood pressures that have been taken at home and her blood pressure has been running higher at baseline.  She just had her dose of prednisone increased.  She is not having any acute shortness of breath, PND or orthopnea.  She is not having any new palpitations, presyncope or syncope.   Past Medical History:  Diagnosis Date  . Anemia 12/10/2011  . ANXIETY 09/23/2007  . Arthritis   . Atrial tachycardia ([place])   . Breast cancer ([place])   . HYPERLIPIDEMIA 07/03/2009  . HYPERTENSION 07/25/2006  . Macular degeneration   . MVP (mitral valve prolapse)   . Osteoarthritis   . Osteoporosis   . Prediabetes 06/15/2015    Past Surgical History:  Procedure Laterality Date  . BREAST LUMPECTOMY Right 08/08/2011  . BREAST SURGERY  08/08/11   lumpectomy  . CARPAL TUNNEL RELEASE  09/2004  . CATARACT EXTRACTION  04/2000, 03/2002     Current Outpatient Medications  Medication Sig Dispense Refill  . amoxicillin (AMOXIL) 500 MG tablet TAKE 4 TABLETS BY MOUTH 1 HOUR PRIOR TO APPOINTMENT    . aspirin EC 81 MG tablet Take 81 mg by mouth at bedtime.      . bisacodyl (BISACODYL) 5 MG EC tablet Take 1 tablet (5 mg total) by mouth daily as needed for moderate constipation. 30 tablet 0  . Calcium Carb-Cholecalciferol [phone] MG-UNIT CAPS Take 1 capsule by mouth 2 (two) times daily.    . Cholecalciferol (VITAMIN D3) 50 MCG (2000 UT) TABS Take by mouth.    . diazepam (VALIUM) 5 MG tablet Take 0.5 tablets (2.5 mg total) by mouth 2 (two) times daily. 30 tablet 0  . diltiazem (CARDIZEM CD) 120 MG 24 hr capsule TAKE 1 CAPSULE(120 MG) BY MOUTH DAILY 90 capsule 1  . losartan (COZAAR) 100 MG tablet TAKE 1 TABLET(100 MG) BY MOUTH DAILY 90 tablet 3  . metoprolol succinate (TOPROL-XL) 25 MG 24 hr tablet Take 1 tablet (25 mg) by mouth twice daily. 180 tablet 3  . Multiple Vitamin (MULTIVITAMIN WITH MINERALS) TABS tablet Take 1 tablet by mouth daily.    . Multiple Vitamins-Minerals (ICAPS AREDS FORMULA PO) Take 2 tablets by mouth.    . Omega-3 Fatty Acids (FISH OIL) 1200 MG CAPS Take 1 capsule by mouth daily.    . prazosin (MINIPRESS) 2 MG capsule Take 2 capsules (4 mg total) by mouth 2 (two) times daily. 120 capsule 5  . psyllium (METAMUCIL) 58.6 % powder Take 1 packet by mouth 3 (three) times  daily as needed.    . sertraline (ZOLOFT) 25 MG tablet Take 1 tablet (25 mg total) by mouth daily. 90 tablet 1  . simvastatin (ZOCOR) 20 MG tablet TAKE 1 TABLET BY MOUTH EVERY DAY 90 tablet 1  . STOOL SOFTENER 100 MG capsule TAKE 2 CAPSULES(200 MG) BY MOUTH DAILY FOR CONSTIPATION 60 capsule 3   No current facility-administered medications for this visit.    Allergies:   Fosamax [alendronate sodium]    ROS:  Please see the history of present illness.   Otherwise, review of systems are positive for none.   All other systems are reviewed and negative.    PHYSICAL EXAM: VS:  BP (!) 158/56 (BP Location: Right Arm, Patient Position: Sitting, Cuff Size: Normal)   Pulse 60   Ht 5' (1.524 m)   Wt 96 lb (43.5 kg)   BMI 18.75 kg/m  , BMI Body mass index is 18.75  kg/m. GENERAL:  Well appearing NECK:  No jugular venous distention, waveform within normal limits, carotid upstroke brisk and symmetric, no bruits, no thyromegaly LUNGS:  Clear to auscultation bilaterally CHEST:  Unremarkable HEART:  PMI not displaced or sustained,S1 and S2 within normal limits, no S3, no S4, no clicks, no rubs, no murmurs ABD:  Flat, positive bowel sounds normal in frequency in pitch, no bruits, no rebound, no guarding, no midline pulsatile mass, no hepatomegaly, no splenomegaly EXT:  2 plus pulses throughout, no edema, no cyanosis no clubbing    EKG:  EKG is not  ordered today. Sinus rhythm, rate 60, axis within normal limits, intervals within normal limits, no acute ST-T wave changes.   Recent Labs: 03/14/2020: TSH 3.40 04/11/2020: ALT 19; BUN 11; Creatinine, Ser 0.80; Hemoglobin 10.9; Platelets 201; Potassium 3.5; Sodium 133    Lipid Panel    Component Value Date/Time   CHOL 121 03/14/2020 1603   TRIG 140 03/14/2020 1603   HDL 52 03/14/2020 1603   CHOLHDL 2.3 03/14/2020 1603   VLDL 16.2 07/19/2019 1443   LDLCALC 47 03/14/2020 1603      Wt Readings from Last 3 Encounters:  04/25/20 96 lb (43.5 kg)  04/18/20 97 lb (44 kg)  03/14/20 98 lb (44.5 kg)      Other studies Reviewed: Additional studies/ records that were reviewed today include: ED records. Review of the above records demonstrates:  Please see elsewhere in the note.     ASSESSMENT AND PLAN:  HYPERTENSION -  We talked about as needed treatments.  I think anxiety probably drives her blood pressure.  I will not make any changes to her baseline regimen as she just had her prazosin increased.  We talked about treating her with her as needed benzodiazepine as anxiety contributes.  She could then take a dose of clonidine which she has at home.  I think she would tolerate this.  Otherwise no change in therapy.  They will call me if they need something otherwise such as hydralazine as needed.  Current  medicines are reviewed at length with the patient today.  The patient does not have concerns regarding medicines.  The following changes have been made:  None  Labs/ tests ordered today include: None  Orders Placed This Encounter  Procedures  . EKG 12-Lead      Disposition:   FU with me in one year   Signed, Minus Breeding, MD  04/25/2020 6:29 PM    [place]

## 2020-04-26 ENCOUNTER — Other Ambulatory Visit: Payer: Self-pay | Admitting: Internal Medicine

## 2020-04-26 DIAGNOSIS — Z1231 Encounter for screening mammogram for malignant neoplasm of breast: Secondary | ICD-10-CM

## 2020-05-01 ENCOUNTER — Other Ambulatory Visit: Payer: Self-pay | Admitting: Internal Medicine

## 2020-05-01 NOTE — Telephone Encounter (Signed)
Last refill 04/03/20 Last OV 04/18/20 Next OV 09/12/20

## 2020-06-28 ENCOUNTER — Other Ambulatory Visit: Payer: Self-pay | Admitting: Internal Medicine

## 2020-06-29 ENCOUNTER — Ambulatory Visit (INDEPENDENT_AMBULATORY_CARE_PROVIDER_SITE_OTHER): Payer: Medicare Other | Admitting: Neurology

## 2020-06-29 ENCOUNTER — Encounter: Payer: Self-pay | Admitting: Neurology

## 2020-06-29 ENCOUNTER — Other Ambulatory Visit: Payer: Self-pay

## 2020-06-29 VITALS — BP 170/82 | HR 74 | Resp 20 | Ht 59.0 in | Wt 97.0 lb

## 2020-06-29 DIAGNOSIS — F0390 Unspecified dementia without behavioral disturbance: Secondary | ICD-10-CM | POA: Diagnosis not present

## 2020-06-29 DIAGNOSIS — F03B0 Unspecified dementia, moderate, without behavioral disturbance, psychotic disturbance, mood disturbance, and anxiety: Secondary | ICD-10-CM

## 2020-06-29 DIAGNOSIS — I2510 Atherosclerotic heart disease of native coronary artery without angina pectoris: Secondary | ICD-10-CM | POA: Diagnosis not present
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
## 2020-06-29 NOTE — Patient Instructions (Signed)
Good to meet you!  RECOMMENDATIONS FOR ALL PATIENTS WITH MEMORY PROBLEMS: 1. Continue to exercise (Recommend 30 minutes of walking everyday, or 3 hours every week) 2. Increase social interactions - continue going to [place] and enjoy social gatherings with friends and family 3. Eat healthy, avoid fried foods and eat more fruits and vegetables 4. Maintain adequate blood pressure, blood sugar, and blood cholesterol level. Reducing the risk of stroke and cardiovascular disease also helps promoting better memory. 5. Avoid stressful situations. Live a simple life and avoid aggravations. Organize your time and prepare for the next day in anticipation. 6. Sleep well, avoid any interruptions of sleep and avoid any distractions in the bedroom that may interfere with adequate sleep quality 7. Avoid sugar, avoid sweets as there is a strong link between excessive sugar intake, diabetes, and cognitive impairment We discussed the Mediterranean diet, which has been shown to help patients reduce the risk of progressive memory disorders and reduces cardiovascular risk. This includes eating fish, eat fruits and green leafy vegetables, nuts like almonds and hazelnuts, walnuts, and also use olive oil. Avoid fast foods and fried foods as much as possible. Avoid sweets and sugar as sugar use has been linked to worsening of memory function.  There is always a concern of gradual progression of memory problems. If this is the case, then we may need to adjust level of care according to patient needs. Support, both to the patient and caregiver, should then be put into place.   FALL PRECAUTIONS: Be cautious when walking. Scan the area for obstacles that may increase the risk of trips and falls. When getting up in the mornings, sit up at the edge of the bed for a few minutes before getting out of bed. Consider elevating the bed at the head end to avoid drop of blood pressure when getting up. Walk always in a well-lit room (use  night lights in the walls). Avoid area rugs or power cords from appliances in the middle of the walkways. Use a walker or a cane if necessary and consider physical therapy for balance exercise. Get your eyesight checked regularly.  HOME SAFETY: Consider the safety of the kitchen when operating appliances like stoves, microwave oven, and blender. Consider having supervision and share cooking responsibilities until no longer able to participate in those. Accidents with firearms and other hazards in the house should be identified and addressed as well.  ABILITY TO BE LEFT ALONE: If patient is unable to contact 911 operator, consider using LifeLine, or when the need is there, arrange for someone to stay with patients. Smoking is a fire hazard, consider supervision or cessation. Risk of wandering should be assessed by caregiver and if detected at any point, supervision and safe proof recommendations should be instituted.

## 2020-07-12 ENCOUNTER — Telehealth: Payer: Self-pay | Admitting: Internal Medicine

## 2020-07-12 NOTE — Telephone Encounter (Signed)
Gabrielle Warren is calling to request a letter for the court she has been summoned to jury duty and is wanting to request a dismissal as she is the only caregiver for her mom Gabrielle Warren. [place])who has dementia  The court date is 2.14.22   Please call the [place] at [phone]

## 2020-07-13 NOTE — Telephone Encounter (Signed)
Ok - I need her juror number

## 2020-07-14 NOTE — Telephone Encounter (Signed)
Mailed out today

## 2020-07-14 NOTE — Telephone Encounter (Signed)
Letter printed.

## 2020-07-20 ENCOUNTER — Ambulatory Visit: Payer: Medicare Other

## 2020-07-20 ENCOUNTER — Other Ambulatory Visit: Payer: Self-pay | Admitting: Internal Medicine

## 2020-08-28 ENCOUNTER — Telehealth: Payer: Self-pay | Admitting: Internal Medicine

## 2020-08-31 ENCOUNTER — Other Ambulatory Visit: Payer: Self-pay

## 2020-08-31 ENCOUNTER — Ambulatory Visit
Admission: RE | Admit: 2020-08-31 | Discharge: 2020-08-31 | Disposition: A | Discharge status: Home / Self Care | Payer: Medicare Other | Source: Ambulatory Visit | Attending: Internal Medicine | Admitting: Internal Medicine

## 2020-08-31 DIAGNOSIS — Z1231 Encounter for screening mammogram for malignant neoplasm of breast: Secondary | ICD-10-CM

## 2020-09-01 MED ORDER — DIAZEPAM 5 MG PO TABS
ORAL_TABLET | ORAL | 1 refills | Status: DC
Start: 2020-09-01 — End: 2020-10-31

## 2020-09-01 NOTE — Telephone Encounter (Signed)
    Daughter calling to request refill  diazepam (VALIUM) 5 MG tablet  Pharmacy [facility], [place]

## 2020-09-04 ENCOUNTER — Other Ambulatory Visit: Payer: Self-pay | Admitting: Internal Medicine

## 2020-09-05 ENCOUNTER — Other Ambulatory Visit: Payer: Self-pay | Admitting: Internal Medicine

## 2020-09-11 DIAGNOSIS — I700 Atherosclerosis of aorta: Secondary | ICD-10-CM | POA: Insufficient documentation

## 2020-09-11 NOTE — Patient Instructions (Addendum)
    Blood work was ordered.      Medications changes include :   none     Please followup in 6 months  

## 2020-09-11 NOTE — Progress Notes (Signed)
Subjective:    Patient ID: Gabrielle Warren, female    DOB: 1926-05-05, 87 y.o.   MRN: [id number]  HPI The patient is here for follow up of their chronic medical problems, including htn, aortic atherosclerosis, hyperlipidemia, anxiety, MCI, prediabetes.  She is here with her daughter.    Feels unsteady. She uses rollator   She eats ok - not much.    Medications and allergies reviewed with patient and updated if appropriate.  Patient Active Problem List   Diagnosis Date Noted  . Aortic atherosclerosis ([place]) 09/11/2020  . Hypertensive urgency 04/25/2020  . Murmur 09/15/2019  . Educated about COVID-19 virus infection 09/15/2019  . Decreased hearing 01/05/2019  . History of breast cancer 04/29/2018  . Urinary leakage 04/28/2018  . Malignant neoplasm of central portion of right breast in female, estrogen receptor positive ([place]) 09/02/2016  . Constipation 12/05/2015  . Macular degeneration 06/15/2015  . Back pain 06/15/2015  . Memory difficulties 06/15/2015  . Prediabetes 06/15/2015  . Breast cancer, right breast ([place]) 09/05/2014  . Allergic rhinitis 02/01/2014  . Anemia 09/22/2013  . Invasive ductal carcinoma of breast, stage 1 ([place]) 07/18/2011  . CAD (coronary artery disease) 04/26/2011  . Hyperlipidemia 07/03/2009  . BRUIT 07/21/2008  . Anxiety 09/23/2007  . OSTEOARTHRITIS 09/23/2007  . Osteoporosis 09/23/2007  . Essential hypertension 07/25/2006    Current Outpatient Medications on File Prior to Visit  Medication Sig Dispense Refill  . aspirin EC 81 MG tablet Take 81 mg by mouth at bedtime.    . bisacodyl (BISACODYL) 5 MG EC tablet Take 1 tablet (5 mg total) by mouth daily as needed for moderate constipation. 30 tablet 0  . Calcium Carb-Cholecalciferol [phone] MG-UNIT CAPS Take 1 capsule by mouth 2 (two) times daily.    . Cholecalciferol (VITAMIN D3) 50 MCG (2000 UT) TABS Take by mouth.    . diazepam (VALIUM) 5 MG tablet TAKE 1 TABLET(5 MG) BY MOUTH TWICE DAILY  (Patient taking differently: TAKE  2.5 mg TABLET MOUTH DAILY) 30 tablet 1  . diltiazem (CARDIZEM CD) 120 MG 24 hr capsule TAKE 1 CAPSULE(120 MG) BY MOUTH DAILY 90 capsule 1  . losartan (COZAAR) 100 MG tablet TAKE 1 TABLET(100 MG) BY MOUTH DAILY 90 tablet 3  . metoprolol succinate (TOPROL-XL) 25 MG 24 hr tablet Take 1 tablet (25 mg) by mouth twice daily. 180 tablet 3  . Multiple Vitamin (MULTIVITAMIN WITH MINERALS) TABS tablet Take 1 tablet by mouth daily.    . Multiple Vitamins-Minerals (ICAPS AREDS FORMULA PO) Take 2 tablets by mouth.    . Omega-3 Fatty Acids (FISH OIL) 1200 MG CAPS Take 1 capsule by mouth daily.    . prazosin (MINIPRESS) 2 MG capsule Take 2 capsules (4 mg total) by mouth 2 (two) times daily. 120 capsule 5  . psyllium (METAMUCIL) 58.6 % powder Take 1 packet by mouth 3 (three) times daily as needed.    . sertraline (ZOLOFT) 25 MG tablet Take 1 tablet (25 mg total) by mouth daily. 90 tablet 1  . simvastatin (ZOCOR) 20 MG tablet TAKE 1 TABLET BY MOUTH EVERY DAY 90 tablet 1  . STOOL SOFTENER 100 MG capsule TAKE 2 CAPSULES(200 MG) BY MOUTH DAILY FOR CONSTIPATION 60 capsule 3  . amoxicillin (AMOXIL) 500 MG tablet TAKE 4 TABLETS BY MOUTH 1 HOUR PRIOR TO APPOINTMENT (Patient not taking: No sig reported)     No current facility-administered medications on file prior to visit.    Past Medical History:  Diagnosis  Date  . Anemia 12/10/2011  . ANXIETY 09/23/2007  . Arthritis   . Atrial tachycardia ([place])   . Breast cancer ([place])   . HYPERLIPIDEMIA 07/03/2009  . HYPERTENSION 07/25/2006  . Macular degeneration   . MVP (mitral valve prolapse)   . Osteoarthritis   . Osteoporosis   . Prediabetes 06/15/2015    Past Surgical History:  Procedure Laterality Date  . BREAST LUMPECTOMY Right 08/08/2011  . BREAST SURGERY  08/08/11   lumpectomy  . CARPAL TUNNEL RELEASE  09/2004  . CATARACT EXTRACTION  04/2000, 03/2002    Social History   Socioeconomic History  . Marital status: Widowed     Spouse name: Not on file  . Number of children: 3  . Years of education: Not on file  . Highest education level: Not on file  Occupational History  . Occupation: Housekeeper  Tobacco Use  . Smoking status: Former Smoker    Quit date: 07/08/1994    Years since quitting: 26.2  . Smokeless tobacco: Never Used  Vaping Use  . Vaping Use: Never used  Substance and Sexual Activity  . Alcohol use: Yes    Comment: 1/2 can of beer with supper occ.  . Drug use: No  . Sexual activity: Not Currently    Partners: Male  Other Topics Concern  . Not on file  Social History Narrative   One story home   No caffeine   Right handed   Social Determinants of Health   Financial Resource Strain: Not on file  Food Insecurity: Not on file  Transportation Needs: Not on file  Physical Activity: Not on file  Stress: Not on file  Social Connections: Not on file    Family History  Problem Relation Age of Onset  . Heart failure Mother   . Stroke Father   . Hypertension Other   . Colon cancer Neg Hx     Review of Systems  Constitutional: Negative for fever.  HENT: Positive for trouble swallowing (food - once a month).   Respiratory: Negative for cough, shortness of breath and wheezing.   Cardiovascular: Negative for chest pain, palpitations and leg swelling.  Gastrointestinal: Negative for abdominal pain.       No gerd  Neurological: Negative for dizziness, light-headedness and headaches.  Psychiatric/Behavioral: Negative for dysphoric mood. The patient is nervous/anxious.        Objective:   Vitals:   09/12/20 1033  BP: 140/68  Pulse: 80  Temp: 98.2 F (36.8 C)  SpO2: 97%   BP Readings from Last 3 Encounters:  09/12/20 140/68  06/29/20 (!) 170/82  04/25/20 (!) 158/56   Wt Readings from Last 3 Encounters:  09/12/20 96 lb 6.4 oz (43.7 kg)  06/29/20 97 lb (44 kg)  04/25/20 96 lb (43.5 kg)   Body mass index is 19.47 kg/m.   Physical Exam    Constitutional: Appears  well-developed and well-nourished. No distress.  HENT:  Head: Normocephalic and atraumatic.  Neck: Neck supple. No tracheal deviation present. No thyromegaly present.  No cervical lymphadenopathy Cardiovascular: Normal rate, regular rhythm and normal heart sounds.   2/6 systolic murmur heard. No carotid bruit .  No edema Pulmonary/Chest: Effort normal and breath sounds normal. No respiratory distress. No has no wheezes. No rales.  Skin: Skin is warm and dry. Not diaphoretic.  Psychiatric: Normal mood and affect. Behavior is normal.      Assessment & Plan:    See Problem List for Assessment and Plan of chronic medical  problems.    This visit occurred during the SARS-CoV-2 public health emergency.  Safety protocols were in place, including screening questions prior to the visit, additional usage of staff PPE, and extensive cleaning of exam room while observing appropriate contact time as indicated for disinfecting solutions.

## 2020-09-12 ENCOUNTER — Other Ambulatory Visit: Payer: Self-pay

## 2020-09-12 ENCOUNTER — Encounter: Payer: Self-pay | Admitting: Internal Medicine

## 2020-09-12 ENCOUNTER — Ambulatory Visit (INDEPENDENT_AMBULATORY_CARE_PROVIDER_SITE_OTHER): Payer: Medicare Other | Admitting: Internal Medicine

## 2020-09-12 VITALS — BP 140/68 | HR 80 | Temp 98.2°F | Ht 59.0 in | Wt 96.4 lb

## 2020-09-12 DIAGNOSIS — I700 Atherosclerosis of aorta: Secondary | ICD-10-CM

## 2020-09-12 DIAGNOSIS — F0280 Dementia in other diseases classified elsewhere without behavioral disturbance: Secondary | ICD-10-CM

## 2020-09-12 DIAGNOSIS — G309 Alzheimer's disease, unspecified: Secondary | ICD-10-CM | POA: Diagnosis not present

## 2020-09-12 DIAGNOSIS — R7303 Prediabetes: Secondary | ICD-10-CM

## 2020-09-12 DIAGNOSIS — E7849 Other hyperlipidemia: Secondary | ICD-10-CM | POA: Diagnosis not present

## 2020-09-12 DIAGNOSIS — I10 Essential (primary) hypertension: Secondary | ICD-10-CM | POA: Diagnosis not present

## 2020-09-12 DIAGNOSIS — R413 Other amnesia: Secondary | ICD-10-CM

## 2020-09-12 DIAGNOSIS — F419 Anxiety disorder, unspecified: Secondary | ICD-10-CM

## 2020-09-12 LAB — CBC WITH DIFFERENTIAL/PLATELET
Basophils Absolute: 0.1 10*3/uL (ref 0.0–0.1)
Basophils Relative: 0.8 % (ref 0.0–3.0)
Eosinophils Absolute: 0.2 10*3/uL (ref 0.0–0.7)
Eosinophils Relative: 2.9 % (ref 0.0–5.0)
HCT: 32.1 % — ABNORMAL LOW (ref 36.0–46.0)
Hemoglobin: 10.9 g/dL — ABNORMAL LOW (ref 12.0–15.0)
Lymphocytes Relative: 20.3 % (ref 12.0–46.0)
Lymphs Abs: 1.3 10*3/uL (ref 0.7–4.0)
MCHC: 34.1 g/dL (ref 30.0–36.0)
MCV: 92.3 fl (ref 78.0–100.0)
Monocytes Absolute: 0.8 10*3/uL (ref 0.1–1.0)
Monocytes Relative: 13.1 % — ABNORMAL HIGH (ref 3.0–12.0)
Neutro Abs: 4.1 10*3/uL (ref 1.4–7.7)
Neutrophils Relative %: 62.9 % (ref 43.0–77.0)
Platelets: 191 10*3/uL (ref 150.0–400.0)
RBC: 3.48 Mil/uL — ABNORMAL LOW (ref 3.87–5.11)
RDW: 13.2 % (ref 11.5–15.5)
WBC: 6.5 10*3/uL (ref 4.0–10.5)

## 2020-09-12 LAB — COMPREHENSIVE METABOLIC PANEL
ALT: 11 U/L (ref 0–35)
AST: 16 U/L (ref 0–37)
Albumin: 3.7 g/dL (ref 3.5–5.2)
Alkaline Phosphatase: 65 U/L (ref 39–117)
BUN: 19 mg/dL (ref 6–23)
CO2: 33 mEq/L — ABNORMAL HIGH (ref 19–32)
Calcium: 9.6 mg/dL (ref 8.4–10.5)
Chloride: 100 mEq/L (ref 96–112)
Creatinine, Ser: 0.95 mg/dL (ref 0.40–1.20)
GFR: 52.1 mL/min — ABNORMAL LOW (ref >60.00)
Glucose, Bld: 118 mg/dL — ABNORMAL HIGH (ref 70–99)
Potassium: 4.6 mEq/L (ref 3.5–5.1)
Sodium: 137 mEq/L (ref 135–145)
Total Bilirubin: 0.4 mg/dL (ref 0.2–1.2)
Total Protein: 6.8 g/dL (ref 6.0–8.3)

## 2020-09-12 LAB — HEMOGLOBIN A1C: Hgb A1c MFr Bld: 5.6 % (ref 4.6–6.5)

## 2020-09-12 NOTE — Assessment & Plan Note (Signed)
Chronic Controlled, stable Continue sertraline 25 mg daily, valium 2.5 mg BID

## 2020-09-12 NOTE — Assessment & Plan Note (Signed)
Chronic Mild-moderate likely due to Alzheimer's disease per neurology No medication recommended due to possible side effects Encouraged increased activity, healthy eating She does like to read

## 2020-09-12 NOTE — Assessment & Plan Note (Signed)
Chronic BP well controlled Continue losartan 100 mg daily, metoprolol xl 25 mg daily cmp

## 2020-09-12 NOTE — Assessment & Plan Note (Signed)
Chronic Check lipid panel  Continue simvastatin 20 mg daily Regular exercise and healthy diet encouraged  

## 2020-09-12 NOTE — Assessment & Plan Note (Signed)
Chronic Check a1c Low sugar / carb diet Stressed regular exercise  

## 2020-09-12 NOTE — Assessment & Plan Note (Signed)
Chronic Continue simvastatin 20 mg daily 

## 2020-09-26 ENCOUNTER — Other Ambulatory Visit: Payer: Self-pay | Admitting: Internal Medicine

## 2020-10-10 ENCOUNTER — Other Ambulatory Visit: Payer: Self-pay | Admitting: Cardiology

## 2020-10-15 ENCOUNTER — Other Ambulatory Visit: Payer: Self-pay | Admitting: Internal Medicine

## 2020-10-15 ENCOUNTER — Other Ambulatory Visit: Payer: Self-pay | Admitting: Cardiology

## 2020-10-18 ENCOUNTER — Other Ambulatory Visit: Payer: Self-pay | Admitting: Internal Medicine

## 2020-10-30 ENCOUNTER — Telehealth: Payer: Self-pay | Admitting: Internal Medicine

## 2020-11-06 MED ORDER — DIAZEPAM 5 MG PO TABS: 2.5000 mg | ORAL_TABLET | Freq: Two times a day (BID) | ORAL | 1 refills | Status: DC

## 2020-11-06 NOTE — Telephone Encounter (Signed)
Patients daughter called and said that the patient takes diazepam (VALIUM) 5 MG tablet half a tablet in the morning and half a tablet at night. She said that the prescription that was sent to the pharmacy is half a tablet daily. She was wondering if a new prescription could be sent to [facility], [place] [facility] [address]. Please advise

## 2020-11-06 NOTE — Addendum Note (Signed)
Addended by: Binnie Rail on: 11/06/2020 10:59 AM   Modules accepted: Orders

## 2020-12-06 ENCOUNTER — Telehealth: Payer: Self-pay | Admitting: Internal Medicine

## 2020-12-06 ENCOUNTER — Other Ambulatory Visit: Payer: Self-pay | Admitting: Internal Medicine

## 2020-12-06 NOTE — Telephone Encounter (Signed)
Type of form received: handicap renewal  Additional comments: Patients daughter [name] has asked to be called when it Is ready [phone]  Received by: Theodosia Quay  Form should be Faxed to: n/a Form should be mailed to:   n/a Is patient requesting call for pickup:   yes  Form placed in the Provider's box.  Attach charge sheet.  Provider will determine charge.  Was patient informed of  7-10 business day turn around (Y/N)?

## 2020-12-07 NOTE — Telephone Encounter (Signed)
Handicap sticker placed up front for pickup.  Patient notified.

## 2020-12-26 ENCOUNTER — Other Ambulatory Visit: Payer: Self-pay | Admitting: Internal Medicine

## 2021-01-16 ENCOUNTER — Other Ambulatory Visit: Payer: Self-pay | Admitting: Internal Medicine

## 2021-01-16 ENCOUNTER — Telehealth: Payer: Self-pay | Admitting: Internal Medicine

## 2021-01-16 NOTE — Chronic Care Management (AMB) (Signed)
  Chronic Care Management   Outreach Note  01/16/2021 Name: Gabrielle Warren MRN: [id number] DOB: July 11, 1925  Referred by: Binnie Rail, MD Reason for referral : No chief complaint on file.   A second unsuccessful telephone outreach was attempted today. The patient was referred to pharmacist for assistance with care management and care coordination.  Follow Up Plan:   Lauretta Grill Upstream Scheduler

## 2021-01-18 ENCOUNTER — Other Ambulatory Visit: Payer: Self-pay | Admitting: Internal Medicine

## 2021-01-30 ENCOUNTER — Telehealth: Payer: Self-pay | Admitting: Internal Medicine

## 2021-01-30 NOTE — Chronic Care Management (AMB) (Signed)
  Chronic Care Management   Outreach Note  01/30/2021 Name: Gabrielle Warren MRN: [id number] DOB: 14-Jul-1925  Referred by: Binnie Rail, MD Reason for referral : No chief complaint on file.   Third unsuccessful telephone outreach was attempted today. The patient was referred to the pharmacist for assistance with care management and care coordination.   Follow Up Plan:   Lauretta Grill Upstream Scheduler

## 2021-02-13 ENCOUNTER — Other Ambulatory Visit: Payer: Self-pay | Admitting: Internal Medicine

## 2021-03-18 NOTE — Patient Instructions (Addendum)
  Blood work was ordered.     Flu immunization administered today.     Medications changes include:    none        Please followup in 6 months

## 2021-03-18 NOTE — Progress Notes (Signed)
Subjective:    Patient ID: Gabrielle Warren, female    DOB: 10-Dec-1925, 87 y.o.   MRN: [id number]  This visit occurred during the SARS-CoV-2 public health emergency.  Safety protocols were in place, including screening questions prior to the visit, additional usage of staff PPE, and extensive cleaning of exam room while observing appropriate contact time as indicated for disinfecting solutions.     HPI The patient is here for follow up of their chronic medical problems, including htn, hichol, prediabetes, dementia, anxiety.  She is here with her daughter.    Her daughter is here with her.  She often sleeps all morning.  She is less active.    She takes stool softeners daily, bisacodyl almost daily and MOM.  She takes metamucil but probably not regularly.  She does have intermittent problems with constipation.     Medications and allergies reviewed with patient and updated if appropriate.  Patient Active Problem List   Diagnosis Date Noted   Aortic atherosclerosis ([place]) 09/11/2020   Hypertensive urgency 04/25/2020   Murmur 09/15/2019   Educated about COVID-19 virus infection 09/15/2019   Decreased hearing 01/05/2019   History of breast cancer 04/29/2018   Urinary leakage 04/28/2018   Malignant neoplasm of central portion of right breast in female, estrogen receptor positive ([place]) 09/02/2016   Constipation 12/05/2015   Macular degeneration 06/15/2015   Back pain 06/15/2015   Dementia ([place]) 06/15/2015   Prediabetes 06/15/2015   Breast cancer, right breast ([place]) 09/05/2014   Allergic rhinitis 02/01/2014   Anemia 09/22/2013   Invasive ductal carcinoma of breast, stage 1 ([place]) 07/18/2011   CAD (coronary artery disease) 04/26/2011   Hyperlipidemia 07/03/2009   BRUIT 07/21/2008   Anxiety 09/23/2007   OSTEOARTHRITIS 09/23/2007   Osteoporosis 09/23/2007   Essential hypertension 07/25/2006    Current Outpatient Medications on File Prior to Visit  Medication Sig  Dispense Refill   aspirin EC 81 MG tablet Take 81 mg by mouth at bedtime.     bisacodyl (BISACODYL) 5 MG EC tablet Take 1 tablet (5 mg total) by mouth daily as needed for moderate constipation. 30 tablet 0   Calcium Carb-Cholecalciferol [phone] MG-UNIT CAPS Take 1 capsule by mouth 2 (two) times daily.     Cholecalciferol (VITAMIN D3) 50 MCG (2000 UT) TABS Take by mouth.     diazepam (VALIUM) 5 MG tablet TAKE 1/2 TABLET(2.5 MG) BY MOUTH IN THE MORNING AND AT BEDTIME 30 tablet 2   diltiazem (CARDIZEM CD) 120 MG 24 hr capsule TAKE 1 CAPSULE(120 MG) BY MOUTH DAILY 90 capsule 1   losartan (COZAAR) 100 MG tablet TAKE 1 TABLET(100 MG) BY MOUTH DAILY 90 tablet 2   metoprolol succinate (TOPROL-XL) 25 MG 24 hr tablet TAKE 1 TABLET(25 MG) BY MOUTH TWICE DAILY 180 tablet 1   Multiple Vitamin (MULTIVITAMIN WITH MINERALS) TABS tablet Take 1 tablet by mouth daily.     Multiple Vitamins-Minerals (ICAPS AREDS FORMULA PO) Take 2 tablets by mouth.     Omega-3 Fatty Acids (FISH OIL) 1200 MG CAPS Take 1 capsule by mouth daily.     prazosin (MINIPRESS) 2 MG capsule TAKE 2 CAPSULES(4 MG) BY MOUTH TWICE DAILY 120 capsule 5   psyllium (METAMUCIL) 58.6 % powder Take 1 packet by mouth 3 (three) times daily as needed.     sertraline (ZOLOFT) 25 MG tablet TAKE 1 TABLET(25 MG) BY MOUTH DAILY 90 tablet 1   simvastatin (ZOCOR) 20 MG tablet TAKE 1 TABLET BY MOUTH  EVERY DAY 90 tablet 1   STOOL SOFTENER 100 MG capsule TAKE 2 CAPSULES(200 MG) BY MOUTH DAILY FOR CONSTIPATION 60 capsule 3   amoxicillin (AMOXIL) 500 MG tablet TAKE 4 TABLETS BY MOUTH 1 HOUR PRIOR TO APPOINTMENT (Patient not taking: No sig reported)     No current facility-administered medications on file prior to visit.    Past Medical History:  Diagnosis Date   Anemia 12/10/2011   ANXIETY 09/23/2007   Arthritis    Atrial tachycardia ([place])    Breast cancer ([place])    HYPERLIPIDEMIA 07/03/2009   HYPERTENSION 07/25/2006   Macular degeneration    MVP (mitral valve  prolapse)    Osteoarthritis    Osteoporosis    Prediabetes 06/15/2015    Past Surgical History:  Procedure Laterality Date   BREAST LUMPECTOMY Right 08/08/2011   BREAST SURGERY  08/08/11   lumpectomy   CARPAL TUNNEL RELEASE  09/2004   CATARACT EXTRACTION  04/2000, 03/2002    Social History   Socioeconomic History   Marital status: Widowed    Spouse name: Not on file   Number of children: 3   Years of education: Not on file   Highest education level: Not on file  Occupational History   Occupation: Housekeeper  Tobacco Use   Smoking status: Former    Types: Cigarettes    Quit date: 07/08/1994    Years since quitting: 26.7   Smokeless tobacco: Never  Vaping Use   Vaping Use: Never used  Substance and Sexual Activity   Alcohol use: Yes    Comment: 1/2 can of beer with supper occ.   Drug use: No   Sexual activity: Not Currently    Partners: Male  Other Topics Concern   Not on file  Social History Narrative   One story home   No caffeine   Right handed   Social Determinants of Health   Financial Resource Strain: Not on file  Food Insecurity: Not on file  Transportation Needs: Not on file  Physical Activity: Not on file  Stress: Not on file  Social Connections: Not on file    Family History  Problem Relation Age of Onset   Heart failure Mother    Stroke Father    Hypertension Other    Colon cancer Neg Hx     Review of Systems  Constitutional:  Negative for chills and fever.  Respiratory:  Negative for cough, shortness of breath and wheezing.   Cardiovascular:  Negative for chest pain, palpitations and leg swelling.  Gastrointestinal:  Positive for abdominal pain and constipation.  Neurological:  Negative for light-headedness and headaches.      Objective:   Vitals:   03/19/21 1416  BP: (!) 150/72  Pulse: (!) 50  Temp: 98.4 F (36.9 C)  SpO2: 96%   BP Readings from Last 3 Encounters:  03/19/21 (!) 150/72  09/12/20 140/68  06/29/20 (!) 170/82    Wt Readings from Last 3 Encounters:  03/19/21 91 lb 9.6 oz (41.5 kg)  09/12/20 96 lb 6.4 oz (43.7 kg)  06/29/20 97 lb (44 kg)   Body mass index is 18.5 kg/m.   Physical Exam    Constitutional: Appears well-developed and well-nourished. No distress.  HENT:  Head: Normocephalic and atraumatic.  Neck: Neck supple. No tracheal deviation present. No thyromegaly present.  No cervical lymphadenopathy Cardiovascular: Normal rate, regular rhythm and normal heart sounds.   No murmur heard. No carotid bruit .  No edema Pulmonary/Chest: Effort normal and breath  sounds normal. No respiratory distress. No has no wheezes. No rales.  Skin: Skin is warm and dry. Not diaphoretic.  Psychiatric: Normal mood and affect. Behavior is normal.      Assessment & Plan:    See Problem List for Assessment and Plan of chronic medical problems.

## 2021-03-19 ENCOUNTER — Other Ambulatory Visit: Payer: Self-pay

## 2021-03-19 ENCOUNTER — Ambulatory Visit (INDEPENDENT_AMBULATORY_CARE_PROVIDER_SITE_OTHER): Payer: Medicare Other | Admitting: Internal Medicine

## 2021-03-19 ENCOUNTER — Encounter: Payer: Self-pay | Admitting: Internal Medicine

## 2021-03-19 VITALS — BP 150/72 | HR 50 | Temp 98.4°F | Ht 59.0 in | Wt 91.6 lb

## 2021-03-19 DIAGNOSIS — I10 Essential (primary) hypertension: Secondary | ICD-10-CM

## 2021-03-19 DIAGNOSIS — G309 Alzheimer's disease, unspecified: Secondary | ICD-10-CM

## 2021-03-19 DIAGNOSIS — E7849 Other hyperlipidemia: Secondary | ICD-10-CM | POA: Diagnosis not present

## 2021-03-19 DIAGNOSIS — Z23 Encounter for immunization: Secondary | ICD-10-CM | POA: Diagnosis not present

## 2021-03-19 DIAGNOSIS — R7303 Prediabetes: Secondary | ICD-10-CM

## 2021-03-19 DIAGNOSIS — K5900 Constipation, unspecified: Secondary | ICD-10-CM | POA: Diagnosis not present

## 2021-03-19 DIAGNOSIS — F419 Anxiety disorder, unspecified: Secondary | ICD-10-CM | POA: Diagnosis not present

## 2021-03-19 DIAGNOSIS — F0280 Dementia in other diseases classified elsewhere without behavioral disturbance: Secondary | ICD-10-CM

## 2021-03-19 LAB — COMPREHENSIVE METABOLIC PANEL
ALT: 14 U/L (ref 0–35)
AST: 17 U/L (ref 0–37)
Albumin: 3.7 g/dL (ref 3.5–5.2)
Alkaline Phosphatase: 57 U/L (ref 39–117)
BUN: 15 mg/dL (ref 6–23)
CO2: 30 mEq/L (ref 19–32)
Calcium: 9.7 mg/dL (ref 8.4–10.5)
Chloride: 97 mEq/L (ref 96–112)
Creatinine, Ser: 0.94 mg/dL (ref 0.40–1.20)
GFR: 52.57 mL/min — ABNORMAL LOW (ref >60.00)
Glucose, Bld: 109 mg/dL — ABNORMAL HIGH (ref 70–99)
Potassium: 4 mEq/L (ref 3.5–5.1)
Sodium: 134 mEq/L — ABNORMAL LOW (ref 135–145)
Total Bilirubin: 0.5 mg/dL (ref 0.2–1.2)
Total Protein: 6.8 g/dL (ref 6.0–8.3)

## 2021-03-19 LAB — CBC WITH DIFFERENTIAL/PLATELET
Basophils Absolute: 0.1 10*3/uL (ref 0.0–0.1)
Basophils Relative: 0.9 % (ref 0.0–3.0)
Eosinophils Absolute: 0.1 10*3/uL (ref 0.0–0.7)
Eosinophils Relative: 1.8 % (ref 0.0–5.0)
HCT: 30.2 % — ABNORMAL LOW (ref 36.0–46.0)
Hemoglobin: 10.3 g/dL — ABNORMAL LOW (ref 12.0–15.0)
Lymphocytes Relative: 21.2 % (ref 12.0–46.0)
Lymphs Abs: 1.4 10*3/uL (ref 0.7–4.0)
MCHC: 34 g/dL (ref 30.0–36.0)
MCV: 91.8 fl (ref 78.0–100.0)
Monocytes Absolute: 0.8 10*3/uL (ref 0.1–1.0)
Monocytes Relative: 12.2 % — ABNORMAL HIGH (ref 3.0–12.0)
Neutro Abs: 4.3 10*3/uL (ref 1.4–7.7)
Neutrophils Relative %: 63.9 % (ref 43.0–77.0)
Platelets: 207 10*3/uL (ref 150.0–400.0)
RBC: 3.29 Mil/uL — ABNORMAL LOW (ref 3.87–5.11)
RDW: 13.3 % (ref 11.5–15.5)
WBC: 6.7 10*3/uL (ref 4.0–10.5)

## 2021-03-19 LAB — TSH: TSH: 3.23 u[IU]/mL (ref 0.35–5.50)

## 2021-03-19 NOTE — Assessment & Plan Note (Signed)
Chronic  Continue simvastatin 20 mg

## 2021-03-19 NOTE — Assessment & Plan Note (Signed)
Chronic Has been sleeping more, eating less and has lost some weight - discussed with her daughter this may get worse She will encourage her to be more active and eat more

## 2021-03-19 NOTE — Assessment & Plan Note (Signed)
Chronic Has lost weight and not eating much Not concerned about her sugars

## 2021-03-19 NOTE — Assessment & Plan Note (Signed)
Chronic BP well controlled Continue losartan 100 mg qd, cardizem 120 mg qd, metoprolol 25 mg bid, prazosin 4 mg bid cmp

## 2021-03-19 NOTE — Assessment & Plan Note (Signed)
Chronic Controlled, stable Continue valium 0.'25mg'$  bid

## 2021-03-19 NOTE — Addendum Note (Signed)
Addended by: Marcina Millard on: 03/19/2021 04:43 PM   Modules accepted: Orders

## 2021-03-19 NOTE — Assessment & Plan Note (Signed)
Chronic Taking stool softeners, MOM, fiber sometimes and laxatives as needed Take fiber daily Can try prunes daily, smooth move

## 2021-04-06 ENCOUNTER — Other Ambulatory Visit: Payer: Self-pay | Admitting: Cardiology

## 2021-04-17 ENCOUNTER — Other Ambulatory Visit: Payer: Self-pay | Admitting: Internal Medicine

## 2021-04-17 ENCOUNTER — Other Ambulatory Visit: Payer: Self-pay | Admitting: Cardiology

## 2021-05-23 ENCOUNTER — Other Ambulatory Visit: Payer: Self-pay | Admitting: Internal Medicine

## 2021-06-16 ENCOUNTER — Other Ambulatory Visit: Payer: Self-pay | Admitting: Internal Medicine

## 2021-07-06 ENCOUNTER — Other Ambulatory Visit: Payer: Self-pay | Admitting: Cardiology

## 2021-07-24 ENCOUNTER — Other Ambulatory Visit: Payer: Self-pay | Admitting: Cardiology

## 2021-08-09 ENCOUNTER — Telehealth: Payer: Self-pay

## 2021-08-09 NOTE — Telephone Encounter (Signed)
Prazosin needs to be twice a day because it is short acting   Metoprolol - can try taking two tabs once a day    Monitor BP    We try switching sertraline to remeron - it should help her sleep - we would start with a low dose  - let me know if she wants to try that.

## 2021-08-09 NOTE — Telephone Encounter (Signed)
Message left for Gabrielle Warren to return call to clinic.

## 2021-08-09 NOTE — Telephone Encounter (Signed)
Pt daughter Gabrielle Warren is calling requesting a call back regarding a medication adjustment on: metoprolol succinate (TOPROL-XL) 25 MG 24 hr tablet prazosin (MINIPRESS) 2 MG capsule To see if its possible to reduce these medications to once daily instead of twice daily.  Gabrielle Warren is also wanting to know if a medication that could help pt sleep at night can be prescribed.  Pt was awake throughout last night 08/08/21 and had wondered out of the house. Pt had fallen a couple of times before neighbors called for help. No severe injuries just a scrap on leg.   Pt Daughter CB [phone]

## 2021-08-13 MED ORDER — MIRTAZAPINE 7.5 MG PO TABS: 7.5000 mg | ORAL_TABLET | Freq: Every day | ORAL | 0 refills | Status: DC

## 2021-08-13 NOTE — Addendum Note (Signed)
Addended by: Binnie Rail on: 08/13/2021 03:03 PM   Modules accepted: Orders

## 2021-08-13 NOTE — Telephone Encounter (Signed)
Spoke with [place] today.

## 2021-08-13 NOTE — Telephone Encounter (Signed)
Sent remeron to pharmacy - let us know if it is not covered.  Start at bedtime and stop sertraline.

## 2021-08-13 NOTE — Telephone Encounter (Signed)
Pt daughter stated that she would like to try the Remeron but at this time will stay taking Metoprolol twice a day.  Pt daughter CB [phone]

## 2021-08-15 ENCOUNTER — Telehealth: Payer: Self-pay | Admitting: Internal Medicine

## 2021-08-15 NOTE — Addendum Note (Signed)
Addended by: Binnie Rail on: 08/15/2021 04:44 PM   Modules accepted: Orders

## 2021-08-15 NOTE — Telephone Encounter (Signed)
Stop mirtazapine.  My concern with the increased dose of Valium at night is not will worsen her memory more and increase her risk of falling.  We can try a low-dose of Seroquel to see if that helps.  Let me know.

## 2021-08-15 NOTE — Telephone Encounter (Signed)
Pt's daughter [name] states pt is "very very sleepy" when taking mirtazapine (REMERON) 7.5 MG tablet  Caller stated she has been administering 2.5 mg of diazepam (VALIUM)  in the morning and at night   Caller inquiring if she should give diazepam (VALIUM) 5 MG tablet in morning or discontinue all together  Caller requesting a call back

## 2021-08-16 ENCOUNTER — Ambulatory Visit: Payer: Medicare Other | Admitting: Cardiology

## 2021-08-16 NOTE — Telephone Encounter (Signed)
Spoke with patient's daughter today.  She will hold off on the Valium for now and will see how she does being off of it to see if that makes a difference.  She will follow up and let us know if she wants to try the low dose Seroquel.

## 2021-08-21 ENCOUNTER — Telehealth: Payer: Self-pay | Admitting: Internal Medicine

## 2021-08-21 NOTE — Telephone Encounter (Signed)
Let try a low dose of seroquel at night - will not need diazepam at night - ok to take in am.  Rx pending.

## 2021-08-21 NOTE — Telephone Encounter (Signed)
Pts daughter states patient does not sleep at night, caller requesting a rx to help pt sleep  Offered caller an appt, caller declined and is requesting a c/b

## 2021-08-22 MED ORDER — QUETIAPINE FUMARATE 25 MG PO TABS: 25.0000 mg | ORAL_TABLET | Freq: Every day | ORAL | 5 refills | Status: DC

## 2021-08-22 NOTE — Telephone Encounter (Signed)
Spoke with patient's daughter today. 

## 2021-08-23 MED ORDER — QUETIAPINE FUMARATE 25 MG PO TABS: 50.0000 mg | ORAL_TABLET | Freq: Every day | ORAL | 5 refills | Status: DC

## 2021-08-23 NOTE — Telephone Encounter (Signed)
Spoke with daughter today 

## 2021-08-23 NOTE — Addendum Note (Signed)
Addended by: Binnie Rail on: 08/23/2021 02:26 PM   Modules accepted: Orders

## 2021-08-23 NOTE — Telephone Encounter (Signed)
Patient daughter [name] calling in  Says she gave patient medication as directed.. patient went to bed around 7:30pm & woke up around 12am & had a hard time settling back down  Wants to discuss w/ nurse additional med changes if necessary   Please call [phone]

## 2021-08-23 NOTE — Telephone Encounter (Signed)
Try increasing seroquel to 50 mg or 2 pills at night and see if that helps.

## 2021-08-27 ENCOUNTER — Encounter: Payer: Self-pay | Admitting: Internal Medicine

## 2021-08-27 DIAGNOSIS — I10 Essential (primary) hypertension: Secondary | ICD-10-CM

## 2021-08-27 DIAGNOSIS — I2510 Atherosclerotic heart disease of native coronary artery without angina pectoris: Secondary | ICD-10-CM

## 2021-08-27 DIAGNOSIS — F0284 Dementia in other diseases classified elsewhere, unspecified severity, with anxiety: Secondary | ICD-10-CM

## 2021-08-28 ENCOUNTER — Telehealth: Payer: Self-pay | Admitting: *Deleted

## 2021-08-28 NOTE — Chronic Care Management (AMB) (Addendum)
Chronic Care Management   Note  08/28/2021 Name: Gabrielle Warren MRN: [id number] DOB: 02/06/1926  Gabrielle Warren is a 88 y.o. year old female who is a primary care patient of Burns, Claudina Lick, MD. I reached out to [place] by phone today in response to a referral sent by [name]'s PCP.  [name] was given information about Chronic Care Management services today including:  CCM service includes personalized support from designated clinical staff supervised by her physician, including individualized plan of care and coordination with other care providers 24/7 contact phone numbers for assistance for urgent and routine care needs. Service will only be billed when office clinical staff spend 20 minutes or more in a month to coordinate care. Only one practitioner may furnish and bill the service in a calendar month. The patient may stop CCM services at any time (effective at the end of the month) by phone call to the office staff. The patient is responsible for co-pay (up to 20% after annual deductible is met) if co-pay is required by the individual health plan.   Daughter [name],[name] DPR on file verbally agreed to assistance and services provided by embedded care coordination/care management team today.  Follow up plan: Telephone appointment with care management team member scheduled for: [facility] 08/30/21 and SW on 08/29/21  [facility]  Direct Dial: [phone]

## 2021-08-29 ENCOUNTER — Ambulatory Visit (INDEPENDENT_AMBULATORY_CARE_PROVIDER_SITE_OTHER): Payer: Medicare Other | Admitting: Licensed Clinical Social Worker

## 2021-08-29 DIAGNOSIS — F419 Anxiety disorder, unspecified: Secondary | ICD-10-CM

## 2021-08-29 DIAGNOSIS — G309 Alzheimer's disease, unspecified: Secondary | ICD-10-CM

## 2021-08-29 DIAGNOSIS — F0284 Dementia in other diseases classified elsewhere, unspecified severity, with anxiety: Secondary | ICD-10-CM

## 2021-08-29 NOTE — Chronic Care Management (AMB) (Signed)
Chronic Care Management   Clinical Social Work Note  08/29/2021 Name: Gabrielle Warren MRN: [id number] DOB: 1926-05-19  Gabrielle Warren is a 88 y.o. year old female who is a primary care patient of Burns, Claudina Lick, MD. The CCM team was consulted to assist the patient with chronic disease management and/or care coordination needs related to: Level of Care Concerns and Caregiver Stress.   Collaboration with patients daughter  for initial visit in response to provider referral for social work chronic care management and care coordination services.   Consent to Services:  The patient was given the following information about Chronic Care Management services today, agreed to services, and gave verbal consent: 1. CCM service includes personalized support from designated clinical staff supervised by the primary care provider, including individualized plan of care and coordination with other care providers 2. 24/7 contact phone numbers for assistance for urgent and routine care needs. 3. Service will only be billed when office clinical staff spend 20 minutes or more in a month to coordinate care. 4. Only one practitioner may furnish and bill the service in a calendar month. 5.The patient may stop CCM services at any time (effective at the end of the month) by phone call to the office staff. 6. The patient will be responsible for cost sharing (co-pay) of up to 20% of the service fee (after annual deductible is met). Patient agreed to services and consent obtained.  Patient's daughter agreed to services and consent obtained.   Summary: Assessed patient's previous and current treatment, coping skills, support system and barriers to care. Patient's daughter Gabrielle Warren provided all information during this encounter. Report patient is alert to self only and gets confused with time place etc. Family currently exploring home support options not interested in out of home care at this time. Main focus is caregiver  support .  See Care Plan below for interventions and patient self-care actives.  Recommendation: Patient may benefit from, and daughter is in agreement to explore day programs for patient and private pay options.   Follow up Plan: Patient's caregiver would like continued follow-up from CCM [name] .  per caregiver's request CCM [name] will follow up in 3 weeks.  They will call the office if needed prior to next encounter.   Assessment: Review of patient past medical history, allergies, medications, and health status, including review of relevant consultants reports was performed today as part of a comprehensive evaluation and provision of chronic care management and care coordination services.     SDOH (Social Determinants of Health) assessments and interventions performed:    Advanced Directives Status:  Discussed during this encounter, per daughter patient has documents will bring copy to scan into chart.  CCM Care Plan  Conditions to be addressed/monitored: Dementia; Level of care concerns  Care Plan : [name] Care Plan  Updates made by [name], [name] since 08/29/2021 12:00 AM     Problem: Health decline/ Long-Term Care Planning      Goal: Effective Long-Term Care Planning   Start Date: 08/29/2021  This Visit's Progress: On track  Priority: High  Note:   Current Barriers:  Level of Care Concerns:Inability to perform ADL's independently and Memory Deficits  CSW Clinical Goal(s):  Caregiver  will work with Education officer, museum to address concerns related to level of care concerns  through collaboration with [place] representative, provider, and care team.   Interventions: 1:1 collaboration with primary care provider regarding development and update of comprehensive plan of care as  evidenced by provider attestation and co-signature Inter-disciplinary care team collaboration (see longitudinal plan of care) Evaluation of current treatment plan related to  self management and patient's  adherence to plan as established by provider Review resources, discussed options and provided patient information about  Acute vs custodial care for home health needs  Mental Health:  (Status: New goal.) Evaluation of current treatment plan related to Caregiver Stress Solution-Focused Strategies employed:  Active listening / Reflection utilized  Emotional Support Provided Problem [place] strategies reviewed Caregiver stress acknowledged  Provided EMMI education information on Demential: Caregiver and managing agitation   Discussed Health Care Power of Attorney Discussed caregiver resources and support: [employer] and   and   Level of Care Concerns in a patient with Dementia:  (Status: New goal.) Current level of care: home with other family or significant other(s): family member: Daughter Chartered loss adjuster of patient safety in current living environment : has child proof knobs on door Consideration of in-home help encouraged : options discussed Assessed needs, level of care concerns, how currently meeting needs and barriers to care  Has advance directive  Patient Self-Care Activities: Call [facility] to follow up on caregiver support Register here [email or web address]  I also e-mail the information  Anda Latina of Teacher, adult education [employer]  [address], [place]  [address] Direct: [phone]  Mobile: [phone] [email or web address]  Continue to follow up with the [place] Attendance Benefits Review your EMMI educational information (Dementia; Caregiver and managing agitation) Look for an e-mail from [facility].    Casimer Lanius, [name] Licensed Clinical Social Worker [facility]  [place]  [phone]

## 2021-08-29 NOTE — Patient Instructions (Signed)
Visit Information   Thank you for taking time to visit with me today. Please don't hesitate to contact me if I can be of assistance to you before our next scheduled telephone appointment.  Following are the goals we discussed today:  (Copy and paste patient goals from clinical care plan here)  Our next appointment is by telephone on March 15th  at 3:00  Please call the care guide team at [phone] if you need to cancel or reschedule your appointment.   If you are experiencing a Mental Health or [place] or need someone to talk to, please call 1-800-273-TALK (toll free, 24 hour hotline)   Following is a copy of your full care plan:  Care Plan : [place]  Updates made by [name], [name] since 08/29/2021 12:00 AM     Problem: Health decline/ Long-Term Care Planning      Goal: Effective Long-Term Care Planning   Start Date: 08/29/2021  This Visit's Progress: On track  Priority: High  Note:   Current Barriers:  Level of Care Concerns:Inability to perform ADL's independently and Memory Deficits  CSW Clinical Goal(s):  Caregiver  will work with Education officer, museum to address concerns related to level of care concerns  through collaboration with [place] representative, provider, and care team.   Interventions: 1:1 collaboration with primary care provider regarding development and update of comprehensive plan of care as evidenced by provider attestation and co-signature Inter-disciplinary care team collaboration (see longitudinal plan of care) Evaluation of current treatment plan related to  self management and patient's adherence to plan as established by provider Review resources, discussed options and provided patient information about  Acute vs custodial care for home health needs  Mental Health:  (Status: New goal.) Evaluation of current treatment plan related to Caregiver Stress Solution-Focused Strategies employed:  Active listening / Reflection  utilized  Emotional Support Provided Problem [place] strategies reviewed Caregiver stress acknowledged  Provided EMMI education information on Demential: Caregiver and managing agitation   Discussed Health Care Power of Attorney Discussed caregiver resources and support: [employer] and   and   Level of Care Concerns in a patient with Dementia:  (Status: New goal.) Current level of care: home with other family or significant other(s): family member: Daughter Chartered loss adjuster of patient safety in current living environment : has child proof knobs on door Consideration of in-home help encouraged : options discussed Assessed needs, level of care concerns, how currently meeting needs and barriers to care Has advance directive  Patient Self-Care Activities: Call [facility] to follow up on caregiver support Register here [email or web address]  I also e-mail the information  Anda Latina of Teacher, adult education [employer]  [address], [place]  [address] Direct: [phone]  Mobile: [phone] [email or web address]  Continue to follow up with the [place] Attendance Benefits Review your EMMI educational information (Dementia; Caregiver and managing agitation) Look for an e-mail from [facility].     Consent to CCM Services: Ms. Renda was given information about Chronic Care Management services including:  CCM service includes personalized support from designated clinical staff supervised by her physician, including individualized plan of care and coordination with other care providers 24/7 contact phone numbers for assistance for urgent and routine care needs. Service will only be billed when office clinical staff spend 20 minutes or more in a month to coordinate care. Only one practitioner may furnish and bill  the service in a  calendar month. The patient may stop CCM services at any time (effective at the end of the month) by phone call to the office staff. The patient will be responsible for cost sharing (co-pay) of up to 20% of the service fee (after annual deductible is met).  Patient' daughter agreed to services and verbal consent obtained.  Patient's daughter verbalizes understanding of instructions and care plan provided today and agrees to view in [place]. Active MyChart status confirmed with patient.    Casimer Lanius, [name] Licensed Clinical Social Worker [facility]  [place]  [phone]

## 2021-08-30 ENCOUNTER — Ambulatory Visit: Payer: Medicare Other | Admitting: *Deleted

## 2021-08-30 DIAGNOSIS — F0284 Dementia in other diseases classified elsewhere, unspecified severity, with anxiety: Secondary | ICD-10-CM

## 2021-08-30 DIAGNOSIS — G309 Alzheimer's disease, unspecified: Secondary | ICD-10-CM

## 2021-08-30 DIAGNOSIS — I10 Essential (primary) hypertension: Secondary | ICD-10-CM

## 2021-08-30 NOTE — Chronic Care Management (AMB) (Signed)
Chronic Care Management   CCM RN Visit Note  08/30/2021 Name: Gabrielle Warren MRN: [id number] DOB: [birth year]  Subjective: Gabrielle Warren is a 88 y.o. year old female who is a primary care patient of Burns, Claudina Lick, MD. The care management team was consulted for assistance with disease management and care coordination needs.    Engaged with patient's daughter/ care giver [name], ob [facility] DPR by telephone for initial visit in response to provider referral for case management and/or care coordination services.   Consent to Services:  The patient's caregiver was given information about Chronic Care Management services, agreed to services, and gave verbal consent 08/28/21 prior to initiation of services.  Please see initial visit note for detailed documentation. Patient agreed to services and verbal consent obtained.   Assessment: Review of patient past medical history, allergies, medications, health status, including review of consultants reports, laboratory and other test data, was performed as part of comprehensive evaluation and provision of chronic care management services.   SDOH (Social Determinants of Health) assessments and interventions performed:  SDOH Interventions    Flowsheet Row Most Recent Value  SDOH Interventions   Food Insecurity Interventions Intervention Not Indicated  [caregiver/ daughter denies food insecurity]  Housing Interventions Intervention Not Indicated  [lives with daughter/ caregiver one level single family home x 9 years]  Transportation Interventions Intervention Not Indicated  [Family provides transportation]      CCM Care Plan  Allergies  Allergen Reactions   Fosamax [Alendronate Sodium] Other (See Comments)    Reaction: Bleeding gums   Outpatient Encounter Medications as of 08/30/2021  Medication Sig   amoxicillin (AMOXIL) 500 MG tablet TAKE 4 TABLETS BY MOUTH 1 HOUR PRIOR TO APPOINTMENT (Patient not taking: No sig reported)   aspirin EC  81 MG tablet Take 81 mg by mouth at bedtime.   bisacodyl (BISACODYL) 5 MG EC tablet Take 1 tablet (5 mg total) by mouth daily as needed for moderate constipation.   Calcium Carb-Cholecalciferol [phone] MG-UNIT CAPS Take 1 capsule by mouth 2 (two) times daily.   Cholecalciferol (VITAMIN D3) 50 MCG (2000 UT) TABS Take by mouth.   diazepam (VALIUM) 5 MG tablet TAKE 1/2 TABLET(2.5 MG) BY MOUTH IN THE MORNING AND AT BEDTIME.   diltiazem (CARDIZEM CD) 120 MG 24 hr capsule TAKE 1 CAPSULE(120 MG) BY MOUTH DAILY   losartan (COZAAR) 100 MG tablet TAKE 1 TABLET(100 MG) BY MOUTH DAILY   metoprolol succinate (TOPROL-XL) 25 MG 24 hr tablet TAKE 1 TABLET(25 MG) BY MOUTH TWICE DAILY   Multiple Vitamin (MULTIVITAMIN WITH MINERALS) TABS tablet Take 1 tablet by mouth daily.   Multiple Vitamins-Minerals (ICAPS AREDS FORMULA PO) Take 2 tablets by mouth.   Omega-3 Fatty Acids (FISH OIL) 1200 MG CAPS Take 1 capsule by mouth daily.   prazosin (MINIPRESS) 2 MG capsule TAKE 2 CAPSULES(4 MG) BY MOUTH TWICE DAILY   psyllium (METAMUCIL) 58.6 % powder Take 1 packet by mouth 3 (three) times daily as needed.   QUEtiapine (SEROQUEL) 25 MG tablet Take 2 tablets (50 mg total) by mouth at bedtime.   sertraline (ZOLOFT) 25 MG tablet TAKE 1 TABLET(25 MG) BY MOUTH DAILY   simvastatin (ZOCOR) 20 MG tablet TAKE 1 TABLET BY MOUTH EVERY DAY   STOOL SOFTENER 100 MG capsule TAKE 2 CAPSULES(200 MG) BY MOUTH DAILY FOR CONSTIPATION   No facility-administered encounter medications on file as of 08/30/2021.   Patient Active Problem List   Diagnosis Date Noted   Aortic atherosclerosis ([place]) 09/11/2020  Hypertensive urgency 04/25/2020   Murmur 09/15/2019   Educated about COVID-19 virus infection 09/15/2019   Decreased hearing 01/05/2019   History of breast cancer 04/29/2018   Urinary leakage 04/28/2018   Malignant neoplasm of central portion of right breast in female, estrogen receptor positive ([place]) 09/02/2016   Constipation 12/05/2015    Macular degeneration 06/15/2015   Back pain 06/15/2015   Dementia ([place]) 06/15/2015   Prediabetes 06/15/2015   Breast cancer, right breast ([place]) 09/05/2014   Allergic rhinitis 02/01/2014   Anemia 09/22/2013   Invasive ductal carcinoma of breast, stage 1 ([place]) 07/18/2011   CAD (coronary artery disease) 04/26/2011   Hyperlipidemia 07/03/2009   BRUIT 07/21/2008   Anxiety 09/23/2007   OSTEOARTHRITIS 09/23/2007   Osteoporosis 09/23/2007   Essential hypertension 07/25/2006   Conditions to be addressed/monitored:  HTN and Dementia  Care Plan : General Nursing  (Adult)  Updates made by Knox Royalty, RN since 08/30/2021 12:00 AM     Care Plan : RN Care Manager Plan of Care  Updates made by Knox Royalty, RN since 08/30/2021 12:00 AM     Problem: Chronic Disease Management Needs   Priority: High     Long-Range Goal: Development of plan of care for long term chronic disease management   Start Date: 08/30/2021  Expected End Date: 08/30/2022  Priority: High  Note:   Current Barriers:  Chronic Disease Management support and education needs related to HTN and dementia Caregiver fatigue/ stress: caregiver/ daughter assumes primary day-to-day care for patient while continuing to work; other family members (patient's sons) are local and assist as needed/ indicated Progressing dementia; need for assistance with ADL's and iADLs while caregiver is working Building control surveyor has updated her home for safety considerations due to patient's tendency to wander and leave home during night time hours Patient intermittent sleep disturbances- causes caregiver to lose sleep; caregiver reports seroquel helping, but this still occurs periodically  RNCM Clinical Goal(s):  Patient/ caregiver will demonstrate ongoing health management independence as evidenced by adherence to plan of care for dementia and HTN        through collaboration with RN Care manager, provider, and care team.   Interventions: 1:1  collaboration with primary care provider regarding development and update of comprehensive plan of care as evidenced by provider attestation and co-signature Inter-disciplinary care team collaboration (see longitudinal plan of care) Evaluation of current treatment plan related to  self management and patient's adherence to plan as established by provider 08/30/21: CCM RN CM Initial assessment completed Pain assessment completed: caregiver/ daughter denies that patient is in acute/ has chronic pain Falls assessment completed: caregiver denies that patient has had falls x 12 months; has had remote history of falls, none recent; fall risks and prevention education provided; caregiver confirms patient uses rollator walker regularly, particularly when she goes outside of home, with reminding from caregivers Medications discussed with caregiver: she confirms that family caregivers prepare and offer medications to patient; patient "usually" takes medications as prescribed; caregiver denies concerns around medications  Dementia and HTN  (Status: 08/30/21: New goal.) Long Term Goal  Evaluation of current treatment plan related to HTN and Dementia,  self-management and patient's adherence to plan as established by provider. Discussed plans with patient for ongoing care management follow up and provided patient with direct contact information for care management team Reviewed scheduled/upcoming provider appointments including 09/17/21- CCM CSW; 09/19/21- PCP; Discussed plans with patient for ongoing care management follow up and provided patient with direct contact information for  care management team; Confirmed patient has good appetite, no issues with eating: reports occasional swallowing issues: discussed strategies for swallowing, including chin-chest tuck, providing small bite sized food Confirmed patient follows essentially regular diet, care giver does "limit salty foods;" this was encouraged; caregiver  prepares all of patient's meals- heart healthy/ low salt diet encouraged Discussed with care giver patient's day-to-day needs/ goals for care: caregiver states that the family would like to have in-home care assistance with ADL's and sitting with patient during day-to-day activities/ hygiene/ dressing Discussed option of home health services: explained this is short term assistance and must be ordered by PCP Discussed option of private duty care Discussed option care giver presented for VA benefits that may be available for patient for aid and attendance/ respite care, through patient's (deceased) husband's benefit: encouraged caregiver to move forward with getting this benefit in place, as caregiver reports she "knows" patient will not qualify for medicaid benefits Discussed long term goal for patient: caregiver reports the family would like to keep patient a daughter's home for as long as possible, but adds they would consider facility placement "if she continues" to progress with dementia and care givers are unable to provide care safely Confirmed caregiver has spoken to CCM CSW- discussed patient case with CCM CSW post-call: encouraged caregiver to maintain engagement/ communication with CCM CSW; reviewed with caregiver strategies/ resources provided by CSW yesterday and encouraged caregiver to follow up promptly on resources that have been provided  Patient Goals/Self-Care Activities: As evidenced by review of EHR, collaboration with care team, and patient reporting during CCM RN CM outreach,  Patient Relda/ Caregiver daughter [name] will: Take medications as prescribed Attend all scheduled provider appointments Call pharmacy for medication refills Call provider office for new concerns or questions Keep up the great work preventing falls- use your cane as needed Try to follow heart healthy, low salt, low cholesterol, carbohydrate-modified, low sugar diet Review enclosed educational material  about memory strategies and care-giving in setting of dementia      Plan: Telephone follow up appointment with patient's caregiver and care management team member scheduled for:  Monday, October 15, 2021 at 2:15 pm The patient's caregiver has been provided with contact information for the care management team and has been advised to call with any health related questions or concerns  Oneta Rack, RN, BSN, [place] [phone]: direct office

## 2021-08-30 NOTE — Patient Instructions (Signed)
Visit Information  ° °Gabrielle Warren, thank you for taking time to talk with me today about Gabrielle Warren's health care needs. Please don't hesitate to contact me if I can be of assistance to you before our next scheduled telephone appointment. ° °Below are the goals we discussed today:  °Patient Self-Care Activities: °Patient Gabrielle Warren/ Caregiver daughter Gabrielle Warren will: °Take medications as prescribed °Attend all scheduled provider appointments °Call pharmacy for medication refills °Call provider office for new concerns or questions °Keep up the great work preventing falls- use your cane as needed °Try to follow heart healthy, low salt, low cholesterol, carbohydrate-modified, low sugar diet °Review enclosed educational material about memory strategies and care-giving in setting of dementia ° °Our next scheduled telephone follow up visit/ appointment is scheduled on: Monday, October 15, 2021 at 2:15 pm- This is a PHONE CALL appointment ° °If you need to cancel or re-schedule our visit, please call [phone] and our care guide team will be happy to assist you. °  °I look forward to hearing about your progress. °  ° Mckinney , RN, BSN, CCRN Alumnus °[facility] RN Care Coordination- [facility] °[phone]: direct office ° °If you are experiencing a Mental Health or Behavioral Health Crisis or need someone to talk to, please  °call the Suicide and Crisis Lifeline: 988 °call the [place] National Suicide Prevention Lifeline: 1-800-273-8255 or TTY: 1-800-799-4 TTY (1-800-799-4889) to talk to a trained counselor °call 1-800-273-TALK (toll free, 24 hour hotline) °go to [facility] [address], [place] ([phone]) °call 911  ° °Dementia Caregiver Guide °Dementia is a term used to describe a number of symptoms that affect memory and thinking. The most common symptoms include: °Memory loss. °Trouble with language and communication. °Trouble concentrating. °Poor judgment and  problems with reasoning. °Wandering from home or public places. °Extreme anxiety or depression. °Being suspicious or having angry outbursts and accusations. °Child-like behavior and language. °Dementia can be frightening and confusing. And taking care of someone with dementia can be challenging. This guide provides tips to help you when providing care for a person with dementia. °How to help manage lifestyle changes °Dementia usually gets worse slowly over time. In the early stages, people with dementia can stay independent and safe with some help. In later stages, they need help with daily tasks such as dressing, grooming, and using the bathroom. There are actions you can take to help a person manage his or her life while living with this condition. °Communicating °When the person is talking or seems frustrated, make eye contact and hold the person's hand. °Ask specific questions that need yes or no answers. °Use simple words, short sentences, and a calm voice. Only give one direction at a time. °When offering choices, limit the person to just one or two. °Avoid correcting the person in a negative way. °If the person is struggling to find the right words, gently try to help him or her. °Preventing injury ° °Keep floors clear of clutter. Remove rugs, magazine racks, and floor lamps. °Keep hallways well lit, especially at night. °Put a handrail and nonslip mat in the bathtub or shower. °Put childproof locks on cabinets that contain dangerous items, such as medicines, alcohol, guns, toxic cleaning items, sharp tools or utensils, matches, and lighters. °For doors to the outside of the house, put the locks in places where the person cannot see or reach them easily. This will help ensure that the person does not wander out of the house and get lost. °  Be prepared for emergencies. Keep a list of emergency phone numbers and addresses in a convenient area. °Remove car keys and lock garage doors so that the person does not  try to get in the car and drive. °Have the person wear a bracelet that tracks locations and identifies the person as having memory problems. This should be worn at all times for safety. °Helping with daily life ° °Keep the person on track with his or her routine. °Try to identify areas where the person may need help. °Be supportive, patient, calm, and encouraging. °Gently remind the person that adjusting to changes takes time. °Help with the tasks that the person has asked for help with. °Keep the person involved in daily tasks and decisions as much as possible. °Encourage conversation, but try not to get frustrated if the person struggles to find words or does not seem to appreciate your help. °How to recognize stress °Look for signs of stress in yourself and in the person you are caring for. If you notice signs of stress, take steps to manage it. Symptoms of stress include: °Feeling anxious, irritable, frustrated, or angry. °Denying that the person has dementia or that his or her symptoms will not improve. °Feeling depressed, hopeless, or unappreciated. °Difficulty sleeping. °Difficulty concentrating. °Developing stress-related health problems. °Feeling like you have too little time for your own life. °Follow these instructions at home: °Take care of your health °Make sure that you and the person you are caring for: °Get regular sleep. °Exercise regularly. °Eat regular, nutritious meals. °Take over-the-counter and prescription medicines only as told by your health care providers. °Drink enough fluid to keep your urine pale yellow. °Attend all scheduled health care appointments. ° °General instructions °Join a support group with others who are caregivers. °Ask about respite care resources. Respite care can provide short-term care for the person so that you can have a regular break from the stress of caregiving. °Consider any safety risks and take steps to avoid them. °Organize medicines in a pill box for each day  of the week. °Create a plan to handle any legal or financial matters. Get legal or financial advice if needed. °Keep a calendar in a central location to remind the person of appointments or other activities. °Where to find support: °Many individuals and organizations offer support. These include: °Support groups for people with dementia. °Support groups for caregivers. °Counselors or therapists. °Home health care services. °Adult day care centers. °Where to find more information °Centers for Disease Control and Prevention: www.cdc.gov °Alzheimer's Association: www.alz.org °Family Caregiver Alliance: www.caregiver.org °Alzheimer's Foundation of [place]: www.alzfdn.org °Contact a health care provider if: °The person's health is rapidly getting worse. °You are no longer able to care for the person. °Caring for the person is affecting your physical and emotional health. °You are feeling depressed or anxious about caring for the person. °Get help right away if: °The person threatens himself or herself, you, or anyone else. °You feel depressed or sad, or feel that you want to harm yourself. °If you ever feel like your loved one may hurt himself or herself or others, or if he or she shares thoughts about taking his or her own life, get help right away. You can go to your nearest emergency department or: °Call your local emergency services (911 in the [place] °Call a suicide crisis helpline, such as the National Suicide Prevention Lifeline at 1-800-273-8255 or 988 in the [place] This is open 24 hours a day in the [place] °Text the Crisis Text   Line at 513-802-1894 (in the [place] Summary Dementia is a term used to describe a number of symptoms that affect memory and thinking. Dementia usually gets worse slowly over time. Take steps to reduce the person's risk of injury and to plan for future care. Caregivers need support, relief from caregiving, and time for their own lives. This information is not intended to replace advice given  to you by your health care provider. Make sure you discuss any questions you have with your health care provider. Document Revised: 01/17/2021 Document Reviewed: 11/08/2019 Elsevier Patient Education  [place].  Management of Memory Problems  There are some general things you can do to help manage your memory problems.  Your memory may not in fact recover, but by using techniques and strategies you will be able to manage your memory difficulties better.  1)  Establish a routine. Try to establish and then stick to a regular routine.  By doing this, you will get used to what to expect and you will reduce the need to rely on your memory.  Also, try to do things at the same time of day, such as taking your medication or checking your calendar first thing in the morning. Think about think that you can do as a part of a regular routine and make a list.  Then enter them into a daily planner to remind you.  This will help you establish a routine.  2)  Organize your environment. Organize your environment so that it is uncluttered.  Decrease visual stimulation.  Place everyday items such as keys or cell phone in the same place every day (ie.  Basket next to front door) Use post it notes with a brief message to yourself (ie. Turn off light, lock the door) Use labels to indicate where things go (ie. Which cupboards are for food, dishes, etc.) Keep a notepad and pen by the telephone to take messages  3)  Memory Aids A diary or journal/notebook/daily planner Making a list (shopping list, chore list, to do list that needs to be done) Using an alarm as a reminder (kitchen timer or cell phone alarm) Using cell phone to store information (Notes, Calendar, Reminders) Calendar/White board placed in a prominent position Post-it notes  In order for memory aids to be useful, you need to have good habits.  It's no good remembering to make a note in your journal if you don't remember to look in it.  Try  setting aside a certain time of day to look in journal.  4)  Improving mood and managing fatigue. There may be other factors that contribute to memory difficulties.  Factors, such as anxiety, depression and tiredness can affect memory. Regular gentle exercise can help improve your mood and give you more energy. Simple relaxation techniques may help relieve symptoms of anxiety Try to get back to completing activities or hobbies you enjoyed doing in the past. Learn to pace yourself through activities to decrease fatigue. Find out about some local support groups where you can share experiences with others. Try and achieve 7-8 hours of sleep at night.  Following is a copy of your full care plan:  Care Plan : General Nursing  (Adult)  Updates made by Knox Royalty, RN since 08/30/2021 12:00 AM     Care Plan : RN Care Manager Plan of Care  Updates made by Knox Royalty, RN since 08/30/2021 12:00 AM     Problem: Chronic Disease Management Needs   Priority: High  Long-Range Goal: Development of plan of care for long term chronic disease management   Start Date: 08/30/2021  Expected End Date: 08/30/2022  Priority: High  Note:   Current Barriers:  Chronic Disease Management support and education needs related to HTN and dementia Caregiver fatigue/ stress: caregiver/ daughter assumes primary day-to-day care for patient while continuing to work; other family members (patient's sons) are local and assist as needed/ indicated Progressing dementia; need for assistance with ADL's and iADLs while caregiver is working 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   Interventions: 1:1 collaboration with primary care provider regarding development and update of comprehensive plan of care as evidenced by provider attestation and co-signature Inter-disciplinary care team collaboration (see longitudinal plan of care) Evaluation of current treatment plan related to  self management and patient's adherence to plan as established by provider 08/30/21: CCM RN CM Initial assessment completed Pain assessment completed: caregiver/ daughter denies that patient is in acute/ has chronic pain Falls assessment completed: caregiver denies that patient has had falls x 12 months; has had remote history of falls, none recent; fall risks and prevention education provided; caregiver confirms patient uses rollator walker regularly, particularly when she goes outside of home, with reminding from caregivers Medications discussed with caregiver: she confirms that family caregivers prepare and offer medications to patient; patient "usually" takes medications as prescribed; caregiver denies concerns around medications  Dementia and HTN  (Status: 08/30/21: New goal.) Long Term Goal  Evaluation of current treatment plan related to HTN and Dementia,  self-management and patient's adherence to plan as established by provider. Discussed plans with patient for ongoing care management follow up and provided patient with direct contact information for care management team Reviewed scheduled/upcoming provider appointments including 09/17/21- CCM CSW; 09/19/21- PCP; Discussed plans with patient for ongoing care management follow up and provided patient with direct contact information for care management team; Confirmed patient has good appetite, no issues with eating: reports occasional swallowing issues: discussed strategies for swallowing, including chin-chest  tuck, providing small bite sized food Confirmed patient follows essentially regular diet, care giver does "limit salty foods;" this was encouraged; caregiver prepares all of patient's meals- heart healthy/ low salt diet encouraged Discussed with care giver patient's day-to-day needs/ goals for care: caregiver states that the family would like to have in-home care assistance with ADL's and sitting with patient during day-to-day activities/ hygiene/ dressing Discussed option of home health services: explained this is short term assistance and must be ordered by PCP Discussed option of private duty care Discussed option care giver presented for VA benefits that may be available for patient for aid and attendance/ respite care, through patient's (deceased) husband's benefit: encouraged caregiver to move forward with getting this benefit in place, as caregiver reports she "knows" patient will not qualify for medicaid benefits Discussed long term goal for patient: caregiver reports the family would like to keep patient a daughter's home for as long as possible, but adds they would consider facility placement "if she continues" to progress with dementia and care givers are unable to provide  care safely °Confirmed caregiver has spoken to CCM CSW- discussed patient case with CCM CSW post-call: encouraged caregiver to maintain engagement/ communication with CCM CSW; reviewed with caregiver strategies/ resources provided by CSW yesterday and encouraged caregiver to follow up promptly on resources that have been provided ° °Patient Goals/Self-Care Activities: °As evidenced by review of EHR, collaboration with care team, and patient reporting during CCM RN CM outreach,  °Patient Vernis/ Caregiver daughter Gabrielle Warren will: °Take medications as prescribed °Attend all scheduled provider appointments °Call pharmacy for medication refills °Call provider office for new concerns or questions °Keep up the great work preventing  falls- use your cane as needed °Try to follow heart healthy, low salt, low cholesterol, carbohydrate-modified, low sugar diet °Review enclosed educational material about memory strategies and care-giving in setting of dementia °  °  ° °Consent to CCM Services: °Ms. Twaddle was given information about Chronic Care Management services 08/28/21 including:  °CCM service includes personalized support from designated clinical staff supervised by her physician, including individualized plan of care and coordination with other care providers °24/7 contact phone numbers for assistance for urgent and routine care needs. °Service will only be billed when office clinical staff spend 20 minutes or more in a month to coordinate care. °Only one practitioner may furnish and bill the service in a calendar month. °The patient may stop CCM services at any time (effective at the end of the month) by phone call to the office staff. °The patient will be responsible for cost sharing (co-pay) of up to 20% of the service fee (after annual deductible is met). ° °Patient's caregiver agreed to services and verbal consent obtained.  ° °Caregiver verbalizes understanding of instructions and care plan provided today and agrees to view in MyChart. Active MyChart status confirmed with patient's caregiver   °Telephone follow up appointment with care management team member scheduled for:  Monday, October 15, 2021 at 2:15 pm °The patient has been provided with contact information for the care management team and has been advised to call with any health related questions or concerns ° ° °  °

## 2021-09-04 DIAGNOSIS — I10 Essential (primary) hypertension: Secondary | ICD-10-CM

## 2021-09-04 DIAGNOSIS — F0390 Unspecified dementia without behavioral disturbance: Secondary | ICD-10-CM

## 2021-09-06 ENCOUNTER — Other Ambulatory Visit: Payer: Self-pay

## 2021-09-06 ENCOUNTER — Encounter: Payer: Self-pay | Admitting: Internal Medicine

## 2021-09-07 NOTE — Telephone Encounter (Signed)
Pt daughter is calling to check the status of the refill request forQUEtiapine (SEROQUEL) 25 MG tablet  . Pt daughter states pharmacy says its to early to fill due to the dosage increasing from 25mg   to 50mg .  ? ?Pt daughter is hoping for this to be processed today as the pt will be out of meds tomorrow 09/08/21. ? ?Please advise [phone] ?

## 2021-09-08 MED ORDER — QUETIAPINE FUMARATE 50 MG PO TABS: 50.0000 mg | ORAL_TABLET | Freq: Every day | ORAL | 1 refills | Status: DC

## 2021-09-10 ENCOUNTER — Other Ambulatory Visit: Payer: Self-pay | Admitting: Internal Medicine

## 2021-09-16 DIAGNOSIS — G479 Sleep disorder, unspecified: Secondary | ICD-10-CM | POA: Insufficient documentation

## 2021-09-16 NOTE — Patient Instructions (Addendum)
? ? ? ?  Blood work was ordered.   ? ? ?Medications changes include :   none ? ? ?Your prescription(s) have been sent to your pharmacy.  ? ? ? ?Return in about 6 months (around 03/20/2022) for follow up. ? ?

## 2021-09-16 NOTE — Progress Notes (Signed)
? ? ? ? ?Subjective:  ? ? Patient ID: Gabrielle Warren, female    DOB: 12-09-1925, 88 y.o.   MRN: [id number] ? ?This visit occurred during the SARS-CoV-2 public health emergency.  Safety protocols were in place, including screening questions prior to the visit, additional usage of staff PPE, and extensive cleaning of exam room while observing appropriate contact time as indicated for disinfecting solutions.   ? ? ?HPI ?Gabrielle Warren is here for follow up of her chronic medical problems, including htn, hld, prediabetes, dementia, anxiety, sleep difficulties ? ?Last week was coughing up clear mucus.  A few days prior she was outside - ? Allergies.  No symptoms now. ? ?Neither her or her daughter has any questions. ? ?Medications and allergies reviewed with patient and updated if appropriate. ? ?Current Outpatient Medications on File Prior to Visit  ?Medication Sig Dispense Refill  ? amoxicillin (AMOXIL) 500 MG tablet TAKE 4 TABLETS BY MOUTH 1 HOUR PRIOR TO APPOINTMENT    ? aspirin EC 81 MG tablet Take 81 mg by mouth at bedtime.    ? bisacodyl (BISACODYL) 5 MG EC tablet Take 1 tablet (5 mg total) by mouth daily as needed for moderate constipation. 30 tablet 0  ? Calcium Carb-Cholecalciferol [phone] MG-UNIT CAPS Take 1 capsule by mouth 2 (two) times daily.    ? Cholecalciferol (VITAMIN D3) 50 MCG (2000 UT) TABS Take by mouth.    ? Multiple Vitamin (MULTIVITAMIN WITH MINERALS) TABS tablet Take 1 tablet by mouth daily.    ? Multiple Vitamins-Minerals (ICAPS AREDS FORMULA PO) Take 2 tablets by mouth.    ? prazosin (MINIPRESS) 2 MG capsule TAKE 2 CAPSULES(4 MG) BY MOUTH TWICE DAILY 120 capsule 5  ? psyllium (METAMUCIL) 58.6 % powder Take 1 packet by mouth 3 (three) times daily as needed.    ? QUEtiapine (SEROQUEL) 50 MG tablet Take 1 tablet (50 mg total) by mouth at bedtime. 90 tablet 1  ? simvastatin (ZOCOR) 20 MG tablet TAKE 1 TABLET BY MOUTH EVERY DAY 90 tablet 1  ? STOOL SOFTENER 100 MG capsule TAKE 2 CAPSULES(200 MG)  BY MOUTH DAILY FOR CONSTIPATION 60 capsule 3  ? ?No current facility-administered medications on file prior to visit.  ? ? ? ?Review of Systems  ?Constitutional:  Negative for appetite change, chills and fever.  ?Respiratory:  Negative for cough, shortness of breath and wheezing.   ?Cardiovascular:  Negative for chest pain, palpitations and leg swelling.  ?Gastrointestinal:  Negative for abdominal pain.  ?Neurological:  Negative for light-headedness and headaches.  ?Psychiatric/Behavioral:  Positive for sleep disturbance (improved).   ? ?   ?Objective:  ? ?Vitals:  ? 09/17/21 1047  ?BP: 134/72  ?Pulse: 61  ?Temp: 97.9 ?F (36.6 ?C)  ?SpO2: 99%  ? ?BP Readings from Last 3 Encounters:  ?09/17/21 134/72  ?03/19/21 (!) 150/72  ?09/12/20 140/68  ? ?Wt Readings from Last 3 Encounters:  ?09/17/21 90 lb (40.8 kg)  ?03/19/21 91 lb 9.6 oz (41.5 kg)  ?09/12/20 96 lb 6.4 oz (43.7 kg)  ? ?Body mass index is 18.18 kg/m?. ? ?  ?Physical Exam ?Constitutional:   ?   General: She is not in acute distress. ?   Appearance: Normal appearance.  ?HENT:  ?   Head: Normocephalic and atraumatic.  ?Eyes:  ?   Conjunctiva/sclera: Conjunctivae normal.  ?Cardiovascular:  ?   Rate and Rhythm: Normal rate and regular rhythm.  ?   Heart sounds: Normal heart sounds. No murmur heard. ?Pulmonary:  ?  Effort: Pulmonary effort is normal. No respiratory distress.  ?   Breath sounds: Normal breath sounds. No wheezing.  ?Abdominal:  ?   General: There is no distension.  ?   Palpations: Abdomen is soft.  ?   Tenderness: There is no abdominal tenderness.  ?Musculoskeletal:  ?   Cervical back: Neck supple.  ?   Right lower leg: No edema.  ?   Left lower leg: No edema.  ?Lymphadenopathy:  ?   Cervical: No cervical adenopathy.  ?Skin: ?   Findings: No rash.  ?Neurological:  ?   Mental Status: She is alert. Mental status is at baseline.  ?Psychiatric:     ?   Mood and Affect: Mood normal.     ?   Behavior: Behavior normal.  ? ?   ? ?Lab Results  ?Component Value  Date  ? WBC 6.7 03/19/2021  ? HGB 10.3 (L) 03/19/2021  ? HCT 30.2 (L) 03/19/2021  ? PLT 207.0 03/19/2021  ? GLUCOSE 109 (H) 03/19/2021  ? CHOL 121 03/14/2020  ? TRIG 140 03/14/2020  ? HDL 52 03/14/2020  ? [place] 47 03/14/2020  ? ALT 14 03/19/2021  ? AST 17 03/19/2021  ? NA 134 (L) 03/19/2021  ? K 4.0 03/19/2021  ? CL 97 03/19/2021  ? CREATININE 0.94 03/19/2021  ? BUN 15 03/19/2021  ? CO2 30 03/19/2021  ? TSH 3.23 03/19/2021  ? INR 1.1 04/11/2020  ? HGBA1C 5.6 09/12/2020  ? ? ? ?Assessment & Plan:  ? ? ?See Problem List for Assessment and Plan of chronic medical problems.  ? ? ?

## 2021-09-17 ENCOUNTER — Encounter: Payer: Self-pay | Admitting: Internal Medicine

## 2021-09-17 ENCOUNTER — Other Ambulatory Visit: Payer: Self-pay

## 2021-09-17 ENCOUNTER — Ambulatory Visit (INDEPENDENT_AMBULATORY_CARE_PROVIDER_SITE_OTHER): Payer: Medicare Other | Admitting: Internal Medicine

## 2021-09-17 VITALS — BP 134/72 | HR 61 | Temp 97.9°F | Wt 90.0 lb

## 2021-09-17 DIAGNOSIS — G479 Sleep disorder, unspecified: Secondary | ICD-10-CM

## 2021-09-17 DIAGNOSIS — F419 Anxiety disorder, unspecified: Secondary | ICD-10-CM | POA: Diagnosis not present

## 2021-09-17 DIAGNOSIS — E7849 Other hyperlipidemia: Secondary | ICD-10-CM

## 2021-09-17 DIAGNOSIS — R7303 Prediabetes: Secondary | ICD-10-CM | POA: Diagnosis not present

## 2021-09-17 DIAGNOSIS — F0284 Dementia in other diseases classified elsewhere, unspecified severity, with anxiety: Secondary | ICD-10-CM | POA: Diagnosis not present

## 2021-09-17 DIAGNOSIS — D649 Anemia, unspecified: Secondary | ICD-10-CM | POA: Diagnosis not present

## 2021-09-17 DIAGNOSIS — I2510 Atherosclerotic heart disease of native coronary artery without angina pectoris: Secondary | ICD-10-CM | POA: Diagnosis not present

## 2021-09-17 DIAGNOSIS — I10 Essential (primary) hypertension: Secondary | ICD-10-CM

## 2021-09-17 DIAGNOSIS — G309 Alzheimer's disease, unspecified: Secondary | ICD-10-CM

## 2021-09-17 LAB — CBC WITH DIFFERENTIAL/PLATELET
Basophils Absolute: 0.1 10*3/uL (ref 0.0–0.1)
Basophils Relative: 0.8 % (ref 0.0–3.0)
Eosinophils Absolute: 0.2 10*3/uL (ref 0.0–0.7)
Eosinophils Relative: 2.9 % (ref 0.0–5.0)
HCT: 29.2 % — ABNORMAL LOW (ref 36.0–46.0)
Hemoglobin: 10.1 g/dL — ABNORMAL LOW (ref 12.0–15.0)
Lymphocytes Relative: 21.6 % (ref 12.0–46.0)
Lymphs Abs: 1.5 10*3/uL (ref 0.7–4.0)
MCHC: 34.8 g/dL (ref 30.0–36.0)
MCV: 92 fl (ref 78.0–100.0)
Monocytes Absolute: 0.9 10*3/uL (ref 0.1–1.0)
Monocytes Relative: 12.7 % — ABNORMAL HIGH (ref 3.0–12.0)
Neutro Abs: 4.4 10*3/uL (ref 1.4–7.7)
Neutrophils Relative %: 62 % (ref 43.0–77.0)
Platelets: 163 10*3/uL (ref 150.0–400.0)
RBC: 3.17 Mil/uL — ABNORMAL LOW (ref 3.87–5.11)
RDW: 13.2 % (ref 11.5–15.5)
WBC: 7.2 10*3/uL (ref 4.0–10.5)

## 2021-09-17 LAB — COMPREHENSIVE METABOLIC PANEL
ALT: 10 U/L (ref 0–35)
AST: 15 U/L (ref 0–37)
Albumin: 3.6 g/dL (ref 3.5–5.2)
Alkaline Phosphatase: 61 U/L (ref 39–117)
BUN: 21 mg/dL (ref 6–23)
CO2: 30 mEq/L (ref 19–32)
Calcium: 9.4 mg/dL (ref 8.4–10.5)
Chloride: 101 mEq/L (ref 96–112)
Creatinine, Ser: 1.05 mg/dL (ref 0.40–1.20)
GFR: 45.87 mL/min — ABNORMAL LOW (ref >60.00)
Glucose, Bld: 109 mg/dL — ABNORMAL HIGH (ref 70–99)
Potassium: 4.2 mEq/L (ref 3.5–5.1)
Sodium: 137 mEq/L (ref 135–145)
Total Bilirubin: 0.5 mg/dL (ref 0.2–1.2)
Total Protein: 6.5 g/dL (ref 6.0–8.3)

## 2021-09-17 LAB — IBC PANEL
Iron: 94 ug/dL (ref 42–145)
Saturation Ratios: 40.9 % (ref 20.0–50.0)
TIBC: 229.6 ug/dL — ABNORMAL LOW (ref 250.0–450.0)
Transferrin: 164 mg/dL — ABNORMAL LOW (ref 212.0–360.0)

## 2021-09-17 LAB — HEMOGLOBIN A1C: Hgb A1c MFr Bld: 5.8 % (ref 4.6–6.5)

## 2021-09-17 LAB — FERRITIN: Ferritin: 122.1 ng/mL (ref 10.0–291.0)

## 2021-09-17 MED ORDER — DIAZEPAM 5 MG PO TABS
2.5000 mg | ORAL_TABLET | Freq: Every day | ORAL | 5 refills | Status: DC
Start: 2021-09-17 — End: 2022-03-28

## 2021-09-17 MED ORDER — LOSARTAN POTASSIUM 100 MG PO TABS: ORAL_TABLET | ORAL | 1 refills | Status: AC

## 2021-09-17 MED ORDER — METOPROLOL SUCCINATE ER 25 MG PO TB24: ORAL_TABLET | ORAL | 1 refills | Status: DC

## 2021-09-17 MED ORDER — DILTIAZEM HCL ER COATED BEADS 120 MG PO CP24: ORAL_CAPSULE | ORAL | 1 refills | Status: AC

## 2021-09-17 NOTE — Assessment & Plan Note (Signed)
Chronic ?Blood pressure well controlled ?CMP ?Continue losartan 100 mg daily, Cardizem 120 mg daily, metoprolol XL 25 mg mg twice daily and prazosin 4 mg twice daily ?

## 2021-09-17 NOTE — Assessment & Plan Note (Addendum)
Chronic Check A1c 

## 2021-09-17 NOTE — Assessment & Plan Note (Signed)
Chronic ?CBC, iron panel ?

## 2021-09-17 NOTE — Assessment & Plan Note (Addendum)
Chronic ?Controlled, Stable ?No longer on sertraline ?Continue Seroquel 50 mg at bedtime, diazepam 2.5 mg every morning ?

## 2021-09-17 NOTE — Assessment & Plan Note (Addendum)
Increased difficulty sleeping  ?Taking Seroquel 50 mg nightly, which seems to be working well-continue ?

## 2021-09-17 NOTE — Assessment & Plan Note (Addendum)
Chronic ?Having increased difficulty with sleep, anxiety at times ?Seroquel seems to be working well at night ?Dementia has progressed-does not always know who her son is and can often be confused ?Appetite okay ?

## 2021-09-17 NOTE — Assessment & Plan Note (Signed)
Chronic ?Check lipid panel  ?Continue simvastatin 20 mg daily ?

## 2021-09-19 ENCOUNTER — Ambulatory Visit (INDEPENDENT_AMBULATORY_CARE_PROVIDER_SITE_OTHER): Payer: Medicare Other | Admitting: Licensed Clinical Social Worker

## 2021-09-19 DIAGNOSIS — Z636 Dependent relative needing care at home: Secondary | ICD-10-CM

## 2021-09-19 DIAGNOSIS — F0284 Dementia in other diseases classified elsewhere, unspecified severity, with anxiety: Secondary | ICD-10-CM

## 2021-09-19 DIAGNOSIS — F419 Anxiety disorder, unspecified: Secondary | ICD-10-CM

## 2021-09-19 NOTE — Chronic Care Management (AMB) (Signed)
?Chronic Care Management  ? Clinical Social Work Note ? ?09/19/2021 ?Name: ASTRAEA GAUGHRAN MRN: [id number] DOB: September 13, 1925 ? ?Chesni Vos Bouchard is a 88 y.o. year old female who is a primary care patient of Burns, Claudina Lick, MD. The CCM team was consulted to assist the patient with chronic disease management and/or care coordination needs related to: Level of Care Concerns and Caregiver Stress.  ? ?Collaboration with patient's daughter  for follow up visit in response to provider referral for social work chronic care management and care coordination services.  ? ?Consent to Services:  ?The patient was given information about Chronic Care Management services, agreed to services, and gave verbal consent prior to initiation of services.  Please see initial visit note for detailed documentation.  ?Patient agreed to services and consent obtained.  ? ?Summary: Patient's daughter [name] provided all information during this encounter. She is making progress with managing caregiver stress, attended on line information with [facility] and has support from her brothers. She has decided to hold off on a day program for patient at this time .  See Care Plan below for interventions and patient self-care actives. ? ?Recommendation: Patient may benefit from, and daughter is in agreement to assist brother with following up on [place] benefits and explore private pay for an aide.  ? ?Follow up Plan:  ?Caregiver does not desire continued follow-up by CCM [name]. Will contact the office if needed ?Patient may benefit from and daughter is in agreement for CCM [name] to remain part of care team for the next 90 days.  If no needs are identified in the next 90 days, CCM LSCW will disconnect from the care team. ?  ?Assessment: Review of patient past medical history, allergies, medications, and health status, including review of relevant consultants reports was performed today as part of a comprehensive evaluation and provision of chronic care  management and care coordination services.    ? ?SDOH (Social Determinants of Health) assessments and interventions performed:   ? ?Advanced Directives Status:  Has documents ? ?CCM Care Plan ?Conditions to be addressed/monitored: Dementia; Level of care concerns ? ?Care Plan : [name] Care Plan  ?Updates made by [name], [name] since 09/19/2021 12:00 AM  ?  ? ?Problem: Health decline/ Long-Term Care Planning   ?  ? ?Goal: Effective Long-Term Care Planning   ?Start Date: 08/29/2021  ?This Visit's Progress: On track  ?Recent Progress: On track  ?Priority: High  ?Note:   ?Current Barriers:  ?Level of Care Concerns:Inability to perform ADL's independently and Memory Deficits ? ?CSW Clinical Goal(s):  ?Caregiver  will work with Education officer, museum to address concerns related to level of care concerns  through collaboration with [place] representative, provider, and care team.  ? ?Interventions: ?1:1 collaboration with primary care provider regarding development and update of comprehensive plan of care as evidenced by provider attestation and co-signature ?Inter-disciplinary care team collaboration (see longitudinal plan of care) ?Evaluation of current treatment plan related to  self management and patient's adherence to plan as established by provider ?Review resources, discussed options and provided patient information about  ?Acute vs custodial care for home health needs ? ?Mental Health:  (Status: Goal on Track (progressing): YES. Patient declined further engagement on this goal.) ?Evaluation of current treatment plan related to Caregiver Stress ?Solution-Focused Strategies employed:  ?Active listening / Reflection utilized  ?Emotional Support Provided ?Problem Solving /Task Center strategies reviewed ?Caregiver stress acknowledged  ? and  ? ?Level of Care Concerns in  a patient with Dementia:  (Status: New goal.) ?Current level of care: home with other family or significant other(s): family member: Daughter  [name] ?Evaluation of patient safety in current living environment : has child proof knobs on door ?Consideration of in-home help encouraged : options discussed ?Discussed private pay options / [facility]: will private pay for a few hours each week ?Has decided put day program on hold ? ?Patient Self-Care Activities: ?Continue to follow up with the [place] listed below are a few phone numbers  ?[place] ?8:30 - 4:30 Monday - Friday ?[facility] ?[place], [place] [address] ?[phone]  or  CALL [phone]  ?Ludger Nutting (Director) of program ?  ?  ?Casimer Lanius, [name] ?Licensed Clinical Social Worker [facility]  ?[place]  ?[phone]  ? ?

## 2021-09-19 NOTE — Patient Instructions (Addendum)
Visit Information ? ?Thank you for taking time to visit with me today. Please don't hesitate to contact me if I can be of assistance to you before our next scheduled telephone appointment. ? ?Following are the goals we discussed today: Caregiver support and level of Care ?Patient Self-Care Activities: ?Continue to follow up with the [place] listed below are a few phone numbers  ?[place] ?8:30 - 4:30 Monday - Friday ?[facility] ?[place], [place] [address] ?[phone]  or  CALL [phone]  ?Ludger Nutting (Director) of program ? ?Please call the care guide team at [phone] if you need to schedule a phone appointment.  ? ?No follow up scheduled, per our conversation you do not desire continued follow up ?Per our conversation I will remain part of your care team for the next 90 days.  If no needs are identified in the next 90 days, I will disconnect from the care team. ? ?Casimer Lanius, [name] ?Licensed Clinical Social Worker [facility]  ?[place]  ?[phone]  ? ?If you are experiencing a Mental Health or [place] or need someone to talk to, please call 1-800-273-TALK (toll free, 24 hour hotline)  ? ?Patient's daughter verbalizes understanding of instructions and care plan provided today and agrees to view in [place]. Active MyChart status confirmed with patient.   ? ? ?

## 2021-09-25 ENCOUNTER — Telehealth: Payer: Self-pay | Admitting: Internal Medicine

## 2021-09-25 NOTE — Telephone Encounter (Signed)
Type of form received- Department of [facility]  ? ?Form placed in- Provider mailbox ? ?Additional instructions from the patient- Notify daughter, [name] when completed.  ? ? ?Things to remember: ?[place] office: If form received in person, remind patient that forms take 7-10 business days ?CMA should attach charge sheet and put on Supervisor's desk  ?

## 2021-09-26 NOTE — Telephone Encounter (Signed)
Form received and placed in Dr. Eilleen Kempf office to complete. ?

## 2021-09-28 NOTE — Telephone Encounter (Signed)
Form completed and left up front.  ? ?Patient's daughter notified form completed and ready. ?

## 2021-10-08 ENCOUNTER — Other Ambulatory Visit: Payer: Self-pay | Admitting: Internal Medicine

## 2021-10-08 ENCOUNTER — Other Ambulatory Visit: Payer: Self-pay | Admitting: Cardiology

## 2021-10-15 ENCOUNTER — Ambulatory Visit (INDEPENDENT_AMBULATORY_CARE_PROVIDER_SITE_OTHER): Payer: Medicare Other | Admitting: *Deleted

## 2021-10-15 DIAGNOSIS — F0284 Dementia in other diseases classified elsewhere, unspecified severity, with anxiety: Secondary | ICD-10-CM

## 2021-10-15 DIAGNOSIS — I10 Essential (primary) hypertension: Secondary | ICD-10-CM

## 2021-10-15 DIAGNOSIS — R7303 Prediabetes: Secondary | ICD-10-CM

## 2021-10-15 NOTE — Chronic Care Management (AMB) (Signed)
?Chronic Care Management  ? ?CCM RN Visit Note ? ?10/15/2021 ?Name: Gabrielle Warren MRN: [id number] DOB: 03/20/1926 ? ?Subjective: ?Gabrielle Warren is a 88 y.o. year old female who is a primary care patient of Burns, Claudina Lick, MD. The care management team was consulted for assistance with disease management and care coordination needs.   ? ?Engaged with patient by telephone for follow up visit in response to provider referral for case management and/or care coordination services.  ? ?Consent to Services:  ?The patient was given information about Chronic Care Management services, agreed to services, and gave verbal consent prior to initiation of services.  Please see initial visit note for detailed documentation.  ?Patient agreed to services and verbal consent obtained.  ? ?Assessment: Review of patient past medical history, allergies, medications, health status, including review of consultants reports, laboratory and other test data, was performed as part of comprehensive evaluation and provision of chronic care management services.  ?CCM Care Plan ? ?Allergies  ?Allergen Reactions  ? Fosamax [Alendronate Sodium] Other (See Comments)  ?  Reaction: Bleeding gums  ? ?Outpatient Encounter Medications as of 10/15/2021  ?Medication Sig  ? amoxicillin (AMOXIL) 500 MG tablet TAKE 4 TABLETS BY MOUTH 1 HOUR PRIOR TO APPOINTMENT  ? aspirin EC 81 MG tablet Take 81 mg by mouth at bedtime.  ? bisacodyl (BISACODYL) 5 MG EC tablet Take 1 tablet (5 mg total) by mouth daily as needed for moderate constipation.  ? Calcium Carb-Cholecalciferol [phone] MG-UNIT CAPS Take 1 capsule by mouth 2 (two) times daily.  ? Cholecalciferol (VITAMIN D3) 50 MCG (2000 UT) TABS Take by mouth.  ? diazepam (VALIUM) 5 MG tablet Take 0.5 tablets (2.5 mg total) by mouth daily.  ? diltiazem (CARDIZEM CD) 120 MG 24 hr capsule TAKE 1 CAPSULE(120 MG) BY MOUTH DAILY  ? losartan (COZAAR) 100 MG tablet TAKE 1 TABLET(100 MG) BY MOUTH DAILY  ? metoprolol  succinate (TOPROL-XL) 25 MG 24 hr tablet TAKE 1 TABLET(25 MG) BY MOUTH TWICE DAILY  ? Multiple Vitamin (MULTIVITAMIN WITH MINERALS) TABS tablet Take 1 tablet by mouth daily.  ? Multiple Vitamins-Minerals (ICAPS AREDS FORMULA PO) Take 2 tablets by mouth.  ? prazosin (MINIPRESS) 2 MG capsule TAKE 2 CAPSULES(4 MG) BY MOUTH TWICE DAILY  ? psyllium (METAMUCIL) 58.6 % powder Take 1 packet by mouth 3 (three) times daily as needed.  ? QUEtiapine (SEROQUEL) 50 MG tablet Take 1 tablet (50 mg total) by mouth at bedtime.  ? simvastatin (ZOCOR) 20 MG tablet TAKE 1 TABLET BY MOUTH EVERY DAY  ? STOOL SOFTENER 100 MG capsule TAKE 2 CAPSULES(200 MG) BY MOUTH DAILY FOR CONSTIPATION  ? ?No facility-administered encounter medications on file as of 10/15/2021.  ? ?Patient Active Problem List  ? Diagnosis Date Noted  ? Sleep difficulties 09/16/2021  ? Aortic atherosclerosis ([place]) 09/11/2020  ? Hypertensive urgency 04/25/2020  ? Murmur 09/15/2019  ? Decreased hearing 01/05/2019  ? History of breast cancer 04/29/2018  ? Urinary leakage 04/28/2018  ? Malignant neoplasm of central portion of right breast in female, estrogen receptor positive ([place]) 09/02/2016  ? Constipation 12/05/2015  ? Macular degeneration 06/15/2015  ? Back pain 06/15/2015  ? Dementia ([place]) 06/15/2015  ? Prediabetes 06/15/2015  ? Breast cancer, right breast ([place]) 09/05/2014  ? Allergic rhinitis 02/01/2014  ? Anemia 09/22/2013  ? Invasive ductal carcinoma of breast, stage 1 ([place]) 07/18/2011  ? CAD (coronary artery disease) 04/26/2011  ? Hyperlipidemia 07/03/2009  ? BRUIT 07/21/2008  ? Anxiety 09/23/2007  ?  OSTEOARTHRITIS 09/23/2007  ? Osteoporosis 09/23/2007  ? Essential hypertension 07/25/2006  ? ?Conditions to be addressed/monitored:  HTN and Dementia ? ?Care Plan : RN Care Manager Plan of Care  ?Updates made by Knox Royalty, RN since 10/15/2021 12:00 AM  ?  ? ?Problem: Chronic Disease Management Needs   ?Priority: High  ?  ? ?Long-Range Goal: Ongoing adherence to  established plan of care for long term chronic disease management   ?Start Date: 08/30/2021  ?Expected End Date: 08/30/2022  ?Priority: High  ?Note:   ?Current Barriers:  ?Chronic Disease Management support and education needs related to HTN and dementia ?Caregiver fatigue/ stress: caregiver/ daughter assumes primary day-to-day care for patient while continuing to work; other family members (patient's sons) are local and assist as needed/ indicated ?Progressing dementia; need for assistance with ADL's and iADLs while caregiver is working ?10/15/21- caregiver reports has completed and mailed forms for [facility] aid and attendance program- waiting to hear back ?10/15/21- caregiver confirms she has an interview scheduled with private duty in-home care agency this Thursday 10/18/21 ?Caregiver has updated her home for safety considerations due to patient's tendency to wander and leave home during night time hours ?Patient intermittent sleep disturbances- causes caregiver to lose sleep; caregiver reports seroquel helping, but this still occurs periodically ? ?RNCM Clinical Goal(s):  ?Patient/ caregiver will demonstrate ongoing health management independence as evidenced by adherence to plan of care for dementia and HTN        through collaboration with RN Care manager, provider, and care team.  ? ?Interventions: ?1:1 collaboration with primary care provider regarding development and update of comprehensive plan of care as evidenced by provider attestation and co-signature ?Inter-disciplinary care team collaboration (see longitudinal plan of care) ?Evaluation of current treatment plan related to  self management and patient's adherence to plan as established by provider ?Review of patient status, including review of consultants reports, relevant laboratory and other test results, and medications completed ?Pain assessment updated: caregiver denies that patient is in pain ?Falls assessment updated: caregiver continues to deny new/  recent falls x 12 months- continues using cane and/ or rollator as indicated;  positive reinforcement provided with encouragement to continue efforts at fall prevention; previously provided education around fall risks/ prevention reinforced ?Medications discussed: caregiver reports she continues to manage patient's medications; denies current concerns/ issues/ questions around medications; endorses adherence to taking all medications as prescribed ?Reviewed recent PCP office visit 09/17/21: caregiver verbalizes good general understanding of post-office visit instructions and denies questions; reports patient's current clinical condition as "very good, no concerns or problems;" briefly reviewed lab values from appointment ?Reviewed upcoming scheduled provider appointments: none currently scheduled ?Discussed plans with patient for ongoing care management follow up and provided patient with direct contact information for care management team    ? ?Dementia and HTN  (Status: 10/15/21: Goal on Track (progressing): YES.) Long Term Goal  ?Evaluation of current treatment plan related to HTN and Dementia,  self-management and patient's adherence to plan as established by provider. ?Confirmed patient has ongoing good appetite, eating well ?Confirmed caregiver continues receiving good support from her brothers- hoping to take long weekend away; brothers will assume care duties full time during her trip ?Confirmed no recent wandering, accidents at home; confirmed that patient "seems to be resting better" than before ?Confirmed CCM CSW has signed off; caregiver would like to continue with CCM RN CM outreach for now, until resources she is looking into, are in place; she may want to continue working  with CCM RN CM beyond that- as safety net for any future issues/ concerns: I confirmed for caregiver that if CCM CSW needs to become active again in the future, I would be able to place referral/ coordinate accordingly ? ?Patient  Goals/Self-Care Activities: ?As evidenced by review of EHR, collaboration with care team, and patient reporting during CCM RN CM outreach,  ?Patient Marranda/ Caregiver daughter [name] will ensure that patient Cath

## 2021-10-15 NOTE — Patient Instructions (Signed)
Visit Information ? ?Gabrielle Warren, thank you for taking time to talk with me today about Gabrielle Warren's health care needs. Please don't hesitate to contact me if I can be of assistance to you before our next scheduled telephone appointment. ? ?Below are the goals we discussed today:  ?Patient Self-Care Activities: ?Patient Gabrielle Warren/ Caregiver daughter Gabrielle Warren will ensure that patient Gabrielle Warren: ?Takes medications as prescribed ?Attend all scheduled provider appointments ?Call pharmacy for medication refills ?Call provider office for new concerns or questions ?Keep up the great work preventing falls- use your cane or walker as needed ?Continue trying to follow heart healthy, low salt, low cholesterol, carbohydrate-modified, low sugar diet ? ?Our next scheduled telephone follow up visit/ appointment is scheduled on:   Wednesday, December 12, 2021 at 2:15 pm- This is a PHONE CALL appointment ? ?If you need to cancel or re-schedule our visit, please call [phone] and our care guide team will be happy to assist you. ?  ?I look forward to hearing about your progress. ?  ?Gabrielle Rack, RN, BSN, CCRN Alumnus ?Gabrielle Warren ?([phone]: direct office ? ?If you are experiencing a Mental Health or [place] or need someone to talk to, please  ?call the Suicide and Crisis Lifeline: 988 ?call the [place] National Suicide Prevention Lifeline: 787-037-2314 or TTY: 905-728-8218 TTY 909-222-2201) to talk to a trained counselor ?call 1-800-273-TALK (toll free, 24 hour hotline) ?go to [facility] [address] [place] [phone]) ?call 911  ? ?Patient's daughter/ caregiver verbalizes understanding of instructions and care plan provided today and agrees to view in [place]. Active MyChart status confirmed with caregiver ? ?Preventing Type 2 Diabetes Mellitus ?Type 2 diabetes, also called type 2 diabetes mellitus, is a long-term (chronic)  disease that affects sugar (glucose) levels in your blood. Normally, a hormone called insulin allows glucose to enter cells in your body. The cells use glucose for energy. With type 2 diabetes, you will have one or both of these problems: ?Your pancreas does not make enough insulin. ?Cells in your body do not respond properly to insulin that your body makes (insulin resistance). ?Insulin resistance or lack of insulin causes extra glucose to build up in the blood instead of going into cells. As a result, high blood glucose (hyperglycemia) develops. That can cause many complications. Being overweight or obese and having an inactive (sedentary) lifestyle can increase your risk for diabetes. Type 2 diabetes can be delayed or prevented by making certain nutrition and lifestyle changes. ?How can this condition affect me? ?If you do not take steps to prevent diabetes, your blood glucose levels may keep increasing over time. Too much glucose in your blood for a long time can damage your blood vessels, heart, kidneys, nerves, and eyes. ?Type 2 diabetes can lead to chronic health problems and complications, such as: ?Heart disease. ?Stroke. ?Blindness. ?Kidney disease. ?Depression. ?Poor circulation in your feet and legs. In severe cases, a foot or leg may need to be surgically removed (amputated). ?What can increase my risk? ?You may be more likely to develop type 2 diabetes if you: ?Have type 2 diabetes in your family. ?Are overweight or obese. ?Have a sedentary lifestyle. ?Have insulin resistance or a history of prediabetes. ?Have a history of pregnancy-related (gestational) diabetes or polycystic ovary syndrome (PCOS). ?What actions can I take to prevent this? ?It can be difficult to recognize signs of type 2 diabetes. Taking action to prevent the disease before you develop  symptoms is the best way to avoid possible damage to your body. Making certain nutrition and lifestyle changes may prevent or delay the disease and  related health problems. ?Nutrition ? ?Eat healthy meals and snacks regularly. Do not skip meals. Fruit or a handful of nuts is a healthy snack between meals. ?Drink water throughout the day. Avoid drinks that contain added sugar, such as soda or sweetened tea. Drink enough fluid to keep your urine pale yellow. ?Follow instructions from your health care provider about eating or drinking restrictions. ?Limit the amount of food you eat by: ?Managing how much you eat at a time (portion size). ?Checking food labels for the serving sizes of food. ?Using a kitchen scale to weigh amounts of food. ?Saut? or steam food instead of frying it. Cook with water or broth instead of oils or butter. ?Limit saturated fat and salt (sodium) in your diet. Have no more than 1 tsp (2,400 mg) of sodium a day. If you have heart disease or high blood pressure, use less than ??? tsp (1,500 mg) of sodium a day. ?Lifestyle ? ?Lose weight if needed and as told. Your health care provider can determine how much weight loss is best for you and can help you lose weight safely. ?If you are overweight or obese, you may be told to lose at least 5?7% of your body weight. ?Manage blood pressure, cholesterol, and stress. Your health care provider will help determine the best treatment for you. ?Do not use any products that contain nicotine or tobacco. These products include cigarettes, chewing tobacco, and vaping devices, such as e-cigarettes. If you need help quitting, ask your health care provider. ?Activity ? ?Do physical activity that makes your heart beat faster and makes you sweat (moderate intensity). Do this for at least 30 minutes on at least 5 days of the week, or as much as told by your health care provider. ?Ask your health care provider what activities are safe for you. A mix of activities may be best, such as walking, swimming, cycling, and strength training. ?Try to add physical activity into your day. For example: ?Park your car farther  away than usual so that you walk more. ?Take a walk during your lunch break. ?Use stairs instead of elevators or escalators. ?Walk or bike to work instead of driving. ?Alcohol use ?If you drink alcohol: ?Limit how much you have to: ?0?1 drink a day for women who are not pregnant. ?0?2 drinks a day for men. ?Know how much alcohol is in your drink. In the [place] one drink equals one 12 oz bottle of beer (355 mL), one 5 oz glass of wine (148 mL), or one 1? oz glass of hard liquor (44 mL). ?General information ?Talk with your health care provider about your risk factors and how you can reduce your risk for diabetes. ?Have your blood glucose tested regularly, as told by your health care provider. ?Get screening tests as told by your health care provider. You may have these regularly, especially if you have certain risk factors for type 2 diabetes. ?Make an appointment with a registered dietitian. This diet and nutrition specialist can help you make a healthy eating plan and help you understand portion sizes and food labels. ?Where to find support ?Ask your health care provider to recommend a registered dietitian, a certified diabetes care and education specialist, or a weight loss program. ?Look for local or online weight loss groups. ?Join a gym, fitness club, or outdoor activity group, such as  a walking club. ?Where to find more information ?For help and guidance and to learn more about diabetes and diabetes prevention, visit: ?American Diabetes Association (ADA): www.diabetes.org ?[employer] of Diabetes and Digestive and Kidney Diseases: [employer]Fun[employer].dk ?To learn more about healthy eating, visit: ?U.S. Department of Agriculture Scientist, research (physical sciences)): FormerBoss.no ?Office of Disease Prevention and Health Promotion (ODPHP): [place]Est[place].be ?Summary ?You can delay or prevent type 2 diabetes by eating healthy foods, losing weight if needed, and increasing your physical activity. ?Talk with your health care provider about  your risk factors for type 2 diabetes and how you can reduce your risk. ?It can be difficult to recognize the signs of type 2 diabetes. The best way to avoid possible damage to your body is to take action to p

## 2021-11-04 DIAGNOSIS — I10 Essential (primary) hypertension: Secondary | ICD-10-CM

## 2021-11-04 DIAGNOSIS — G309 Alzheimer's disease, unspecified: Secondary | ICD-10-CM | POA: Diagnosis not present

## 2021-11-04 DIAGNOSIS — F0284 Dementia in other diseases classified elsewhere, unspecified severity, with anxiety: Secondary | ICD-10-CM | POA: Diagnosis not present

## 2021-12-08 ENCOUNTER — Other Ambulatory Visit: Payer: Self-pay | Admitting: Internal Medicine

## 2021-12-12 ENCOUNTER — Ambulatory Visit (INDEPENDENT_AMBULATORY_CARE_PROVIDER_SITE_OTHER): Payer: Medicare Other | Admitting: *Deleted

## 2021-12-12 DIAGNOSIS — G309 Alzheimer's disease, unspecified: Secondary | ICD-10-CM

## 2021-12-12 DIAGNOSIS — I10 Essential (primary) hypertension: Secondary | ICD-10-CM

## 2021-12-12 NOTE — Chronic Care Management (AMB) (Signed)
Chronic Care Management   CCM RN Visit Note  12/12/2021 Name: Gabrielle Warren MRN: [id number] DOB: 01-Dec-1925  Subjective: Gabrielle Warren is a 88 y.o. year old female who is a primary care patient of Burns, Claudina Lick, MD. The care management team was consulted for assistance with disease management and care coordination needs.    Engaged with patient by telephone for follow up visit in response to provider referral for case management and/or care coordination services.   Consent to Services:  The patient was given information about Chronic Care Management services, agreed to services, and gave verbal consent prior to initiation of services.  Please see initial visit note for detailed documentation.  Patient agreed to services and verbal consent obtained.   Assessment: Review of patient past medical history, allergies, medications, health status, including review of consultants reports, laboratory and other test data, was performed as part of comprehensive evaluation and provision of chronic care management services.   SDOH (Social Determinants of Health) assessments and interventions performed:  SDOH Interventions    Flowsheet Row Most Recent Value  SDOH Interventions   Food Insecurity Interventions Intervention Not Indicated  [Daughter/ caregiver continues to deny food insecurity]  Housing Interventions Intervention Not Indicated  [patient continues to reside with daughter/ caregiver who continues to deny housing concerns]  Transportation Interventions Intervention Not Indicated  [Daughter/ caregiver continues to provide transportation]     CCM Care Plan  Allergies  Allergen Reactions   Fosamax [Alendronate Sodium] Other (See Comments)    Reaction: Bleeding gums   Outpatient Encounter Medications as of 12/12/2021  Medication Sig   QUEtiapine (SEROQUEL) 50 MG tablet TAKE 1 TABLET(50 MG) BY MOUTH AT BEDTIME   amoxicillin (AMOXIL) 500 MG tablet TAKE 4 TABLETS BY MOUTH 1  HOUR PRIOR TO APPOINTMENT   aspirin EC 81 MG tablet Take 81 mg by mouth at bedtime.   bisacodyl (BISACODYL) 5 MG EC tablet Take 1 tablet (5 mg total) by mouth daily as needed for moderate constipation.   Calcium Carb-Cholecalciferol [phone] MG-UNIT CAPS Take 1 capsule by mouth 2 (two) times daily.   Cholecalciferol (VITAMIN D3) 50 MCG (2000 UT) TABS Take by mouth.   diazepam (VALIUM) 5 MG tablet Take 0.5 tablets (2.5 mg total) by mouth daily.   diltiazem (CARDIZEM CD) 120 MG 24 hr capsule TAKE 1 CAPSULE(120 MG) BY MOUTH DAILY   losartan (COZAAR) 100 MG tablet TAKE 1 TABLET(100 MG) BY MOUTH DAILY   metoprolol succinate (TOPROL-XL) 25 MG 24 hr tablet TAKE 1 TABLET(25 MG) BY MOUTH TWICE DAILY   Multiple Vitamin (MULTIVITAMIN WITH MINERALS) TABS tablet Take 1 tablet by mouth daily.   Multiple Vitamins-Minerals (ICAPS AREDS FORMULA PO) Take 2 tablets by mouth.   prazosin (MINIPRESS) 2 MG capsule TAKE 2 CAPSULES(4 MG) BY MOUTH TWICE DAILY   psyllium (METAMUCIL) 58.6 % powder Take 1 packet by mouth 3 (three) times daily as needed.   simvastatin (ZOCOR) 20 MG tablet TAKE 1 TABLET BY MOUTH EVERY DAY   STOOL SOFTENER 100 MG capsule TAKE 2 CAPSULES(200 MG) BY MOUTH DAILY FOR CONSTIPATION   No facility-administered encounter medications on file as of 12/12/2021.   Patient Active Problem List   Diagnosis Date Noted   Sleep difficulties 09/16/2021   Aortic atherosclerosis ([place]) 09/11/2020   Hypertensive urgency 04/25/2020   Murmur 09/15/2019   Decreased hearing 01/05/2019   History of breast cancer 04/29/2018   Urinary leakage 04/28/2018   Malignant neoplasm of central portion of right breast in female,  estrogen receptor positive ([place]) 09/02/2016   Constipation 12/05/2015   Macular degeneration 06/15/2015   Back pain 06/15/2015   Dementia ([place]) 06/15/2015   Prediabetes 06/15/2015   Breast cancer, right breast ([place]) 09/05/2014   Allergic rhinitis 02/01/2014   Anemia 09/22/2013   Invasive ductal  carcinoma of breast, stage 1 ([place]) 07/18/2011   CAD (coronary artery disease) 04/26/2011   Hyperlipidemia 07/03/2009   BRUIT 07/21/2008   Anxiety 09/23/2007   OSTEOARTHRITIS 09/23/2007   Osteoporosis 09/23/2007   Essential hypertension 07/25/2006   Conditions to be addressed/monitored:  HTN and Dementia  Care Plan : RN Care Manager Plan of Care  Updates made by Knox Royalty, RN since 12/12/2021 12:00 AM     Problem: Chronic Disease Management Needs   Priority: High     Long-Range Goal: Ongoing adherence to established plan of care for long term chronic disease management   Start Date: 08/30/2021  Expected End Date: 08/30/2022  Priority: High  Note:   Current Barriers:  Chronic Disease Management support and education needs related to HTN and dementia Caregiver fatigue/ stress: caregiver/ daughter assumes primary day-to-day care for patient while continuing to work; other family members (patient's sons) are local and assist as needed/ indicated 12/12/21: Caregiver reports "much better" now that she has hired private duty care assistance in home twice weekly; she confirms her siblings are helping out as well; caregiver confirms she even got to take a short vacation in May and reports her stress level is continuing to improve Progressing dementia; need for assistance with ADL's and iADLs while caregiver is working 10/15/21- caregiver reports has completed and mailed forms for [facility] aid and attendance program- waiting to hear back 10/15/21- caregiver confirms she has an interview scheduled with private duty in-home care agency this Thursday 10/18/21 12/12/21: caregiver confirms VA forms in process; confirms she has hired private duty care agency for sitting/ care assistance: using [employer]; they sit with patient twice a week on Tuesday and Friday: reports this is working out Chief Executive Officer has updated her home for safety considerations due to patient's tendency to wander and leave home during  night time hours Patient intermittent sleep disturbances- causes caregiver to lose sleep; caregiver reports seroquel helping, but this still occurs periodically 12/12/21: caregiver reports patient (and therefore, herself) sleeping much better than she was at time of last outreach- confirms seroquel "working very good"  RNCM Clinical Goal(s):  Patient/ caregiver will demonstrate ongoing health management independence as evidenced by adherence to plan of care for dementia and HTN        through collaboration with Consulting civil engineer, provider, and care team.   Interventions: 1:1 collaboration with primary care provider regarding development and update of comprehensive plan of care as evidenced by provider attestation and co-signature Inter-disciplinary care team collaboration (see longitudinal plan of care) Evaluation of current treatment plan related to  self management and patient's adherence to plan as established by provider Review of patient status, including review of consultants reports, relevant laboratory and other test results, and medications completed Discussed current clinical condition: caregiver reports "doing fine, overall;" reports patient has developed an occasional dry-sounding cough, that sometimes is associated with "thick white phlegm;" denies that patient feels bad or has signs/ symptoms of being sick-- confirms no fever, appetite good; she is currently giving mucinex as needed: advised to continue doing what she is currently doing; provided education around signs/ symptoms to look for that would indicate necessitation to scheduled appointment with PCP Pain assessment updated:  caregiver denies that patient is in pain Falls assessment updated: caregiver continues to deny new/ recent falls x 12 months- continues using cane and/ or rollator as indicated;  positive reinforcement provided with encouragement to continue efforts at fall prevention; previously provided education around fall  risks/ prevention reinforced Medications discussed: caregiver reports she continues to manage patient's medications; denies current concerns/ issues/ questions around medications; endorses adherence to taking all medications as prescribed Reviewed upcoming scheduled provider appointments: none currently scheduled; reminded caregiver that patient's next recommended PCP appointment is due in mid-September Discussed plans with patient for ongoing care management follow up and provided patient with direct contact information for care management team     Dementia and HTN  (Status: 12/12/21: Goal on Track (progressing): YES.) Long Term Goal  Evaluation of current treatment plan related to HTN and Dementia,  self-management and patient's adherence to plan as established by provider. Confirmed patient has ongoing good appetite, eating well; trying to follow healthy diet Confirmed caregiver continues receiving good support from her brothers- she was able to take the long weekend vacation she had planned last month and "it really re-set me;" encouraged caregiver to try and take these breaks every few months- she hopes to Confirmed patient has adjusted well to having private duty caregivers in home assisting; caregiver reports patient "seems to even enjoy them" now that this has been in place for several months Confirmed no recent wandering, accidents at home; confirmed that patient "seems to be resting better" than before; continues taking seroquel as prescribed  Patient Goals/Self-Care Activities: As evidenced by review of EHR, collaboration with care team, and patient reporting during CCM RN CM outreach,  Patient Haydn/ Caregiver daughter [name] will ensure that patient Charletta: Takes medications as prescribed Attend all scheduled provider appointments Call pharmacy for medication refills Call provider office for new concerns or questions Keep up the great work preventing falls- use your cane or  walker as needed Continue trying to follow heart healthy, low salt, low cholesterol, carbohydrate-modified, low sugar diet Continue using the in-home care team assistance through the agency that is assisting you      Plan: Telephone follow up appointment with care management team member scheduled for: Monday, April 01, 2022 at 2:15 pm The patient has been provided with contact information for the care management team and has been advised to call with any health related questions or concerns  Oneta Rack, RN, BSN, [place] [phone]: direct office

## 2021-12-12 NOTE — Patient Instructions (Signed)
Visit Information  Gabrielle Warren, thank you for taking time to talk with me today about Gabrielle Warren's health care needs. Please don't hesitate to contact me if I can be of assistance to you before our next scheduled telephone appointment  Below are the goals we discussed today:  Patient Self-Care Activities: Patient Gabrielle Warren/ Caregiver daughter Gabrielle Warren will ensure that patient Gabrielle Warren: Takes medications as prescribed Attend all scheduled provider appointments Call pharmacy for medication refills Call provider office for new concerns or questions Keep up the great work preventing falls- use your cane or walker as needed Continue trying to follow heart healthy, low salt, low cholesterol, carbohydrate-modified, low sugar diet Continue using the in-home care team assistance through the agency that is assisting you   Our next scheduled telephone follow up visit/ appointment is scheduled on: Monday, April 01, 2022 at 2:15 pm- This is a PHONE [place] appointment  If you need to cancel or re-schedule our visit, please call [phone] and our care guide team will be happy to assist you.   I look forward to hearing about your progress.   Oneta Rack, RN, BSN, [place] [phone]: direct office  If you are experiencing a Mental Health or [place] or need someone to talk to, please  call the Suicide and Crisis Lifeline: 988 call the [place] National Suicide Prevention Lifeline: 3067479811 or TTY: (671)393-4026 TTY 585-475-0380) to talk to a trained counselor call 1-800-273-TALK (toll free, 24 hour hotline) go to [facility] [place] [phone]) call 911   Patient's caregiver verbalizes understanding of instructions and care plan provided today and agrees to view in [place]. Active MyChart status and patient understanding of how to access instructions and care  plan via MyChart confirmed with caregiver    Healthy Eating Following a healthy eating pattern may help you to achieve and maintain a healthy body weight, reduce the risk of chronic disease, and live a long and productive life. It is important to follow a healthy eating pattern at an appropriate calorie level for your body. Your nutritional needs should be met primarily through food by choosing a variety of nutrient-rich foods. What are tips for following this plan? Reading food labels Read labels and choose the following: Reduced or low sodium. Juices with 100% fruit juice. Foods with low saturated fats and high polyunsaturated and monounsaturated fats. Foods with whole grains, such as whole wheat, cracked wheat, brown rice, and wild rice. Whole grains that are fortified with folic acid. This is recommended for women who are pregnant or who want to become pregnant. Read labels and avoid the following: Foods with a lot of added sugars. These include foods that contain brown sugar, corn sweetener, corn syrup, dextrose, fructose, glucose, high-fructose corn syrup, honey, invert sugar, lactose, malt syrup, maltose, molasses, raw sugar, sucrose, trehalose, or turbinado sugar. Do not eat more than the following amounts of added sugar per day: 6 teaspoons (25 g) for women. 9 teaspoons (38 g) for men. Foods that contain processed or refined starches and grains. Refined grain products, such as white flour, degermed cornmeal, white bread, and white rice. Shopping Choose nutrient-rich snacks, such as vegetables, whole fruits, and nuts. Avoid high-calorie and high-sugar snacks, such as potato chips, fruit snacks, and candy. Use oil-based dressings and spreads on foods instead of solid fats such as butter, stick margarine, or cream cheese. Limit pre-made sauces, mixes, and "instant" products such as flavored rice,  instant noodles, and ready-made pasta. Try more plant-protein sources, such as tofu, tempeh,  black beans, edamame, lentils, nuts, and seeds. Explore eating plans such as the Mediterranean diet or vegetarian diet. Cooking Use oil to saut or stir-fry foods instead of solid fats such as butter, stick margarine, or lard. Try baking, boiling, grilling, or broiling instead of frying. Remove the fatty part of meats before cooking. Steam vegetables in water or broth. Meal planning  At meals, imagine dividing your plate into fourths: One-half of your plate is fruits and vegetables. One-fourth of your plate is whole grains. One-fourth of your plate is protein, especially lean meats, poultry, eggs, tofu, beans, or nuts. Include low-fat dairy as part of your daily diet. Lifestyle Choose healthy options in all settings, including home, work, school, restaurants, or stores. Prepare your food safely: Wash your hands after handling raw meats. Keep food preparation surfaces clean by regularly washing with hot, soapy water. Keep raw meats separate from ready-to-eat foods, such as fruits and vegetables. Cook seafood, meat, poultry, and eggs to the recommended internal temperature. Store foods at safe temperatures. In general: Keep cold foods at 69F (4.4C) or below. Keep hot foods at 169F (60C) or above. Keep your freezer at [facility] (-17.8C) or below. Foods are no longer safe to eat when they have been between the temperatures of 40-169F (4.4-60C) for more than 2 hours. What foods should I eat? Fruits Aim to eat 2 cup-equivalents of fresh, canned (in natural juice), or frozen fruits each day. Examples of 1 cup-equivalent of fruit include 1 small apple, 8 large strawberries, 1 cup canned fruit,  cup dried fruit, or 1 cup 100% juice. Vegetables Aim to eat 2-3 cup-equivalents of fresh and frozen vegetables each day, including different varieties and colors. Examples of 1 cup-equivalent of vegetables include 2 medium carrots, 2 cups raw, leafy greens, 1 cup chopped vegetable (raw or cooked), or  1 medium baked potato. Grains Aim to eat 6 ounce-equivalents of whole grains each day. Examples of 1 ounce-equivalent of grains include 1 slice of bread, 1 cup ready-to-eat cereal, 3 cups popcorn, or  cup cooked rice, pasta, or cereal. Meats and other proteins Aim to eat 5-6 ounce-equivalents of protein each day. Examples of 1 ounce-equivalent of protein include 1 egg, 1/2 cup nuts or seeds, or 1 tablespoon (16 g) peanut butter. A cut of meat or fish that is the size of a deck of cards is about 3-4 ounce-equivalents. Of the protein you eat each week, try to have at least 8 ounces come from seafood. This includes salmon, trout, herring, and anchovies. Dairy Aim to eat 3 cup-equivalents of fat-free or low-fat dairy each day. Examples of 1 cup-equivalent of dairy include 1 cup (240 mL) milk, 8 ounces (250 g) yogurt, 1 ounces (44 g) natural cheese, or 1 cup (240 mL) fortified soy milk. Fats and oils Aim for about 5 teaspoons (21 g) per day. Choose monounsaturated fats, such as canola and olive oils, avocados, peanut butter, and most nuts, or polyunsaturated fats, such as sunflower, corn, and soybean oils, walnuts, pine nuts, sesame seeds, sunflower seeds, and flaxseed. Beverages Aim for six 8-oz glasses of water per day. Limit coffee to three to five 8-oz cups per day. Limit caffeinated beverages that have added calories, such as soda and energy drinks. Limit alcohol intake to no more than 1 drink a day for nonpregnant women and 2 drinks a day for men. One drink equals 12 oz of beer (355 mL), 5 oz  of wine (148 mL), or 1 oz of hard liquor (44 mL). Seasoning and other foods Avoid adding excess amounts of salt to your foods. Try flavoring foods with herbs and spices instead of salt. Avoid adding sugar to foods. Try using oil-based dressings, sauces, and spreads instead of solid fats. This information is based on general [place] nutrition guidelines. For more information, visit [employer]. Exact  amounts may vary based on your nutrition needs. Summary A healthy eating plan may help you to maintain a healthy weight, reduce the risk of chronic diseases, and stay active throughout your life. Plan your meals. Make sure you eat the right portions of a variety of nutrient-rich foods. Try baking, boiling, grilling, or broiling instead of frying. Choose healthy options in all settings, including home, work, school, restaurants, or stores. This information is not intended to replace advice given to you by your health care provider. Make sure you discuss any questions you have with your health care provider. Document Revised: 02/20/2021 Document Reviewed: 02/20/2021 Elsevier Patient Education  [place].

## 2022-01-04 DIAGNOSIS — I10 Essential (primary) hypertension: Secondary | ICD-10-CM

## 2022-01-04 DIAGNOSIS — F0284 Dementia in other diseases classified elsewhere, unspecified severity, with anxiety: Secondary | ICD-10-CM

## 2022-01-04 DIAGNOSIS — G309 Alzheimer's disease, unspecified: Secondary | ICD-10-CM

## 2022-01-10 ENCOUNTER — Other Ambulatory Visit: Payer: Self-pay | Admitting: Internal Medicine

## 2022-02-05 ENCOUNTER — Ambulatory Visit: Payer: Medicare Other | Admitting: *Deleted

## 2022-02-05 DIAGNOSIS — I10 Essential (primary) hypertension: Secondary | ICD-10-CM

## 2022-02-05 DIAGNOSIS — G309 Alzheimer's disease, unspecified: Secondary | ICD-10-CM

## 2022-02-05 NOTE — Chronic Care Management (AMB) (Signed)
Care Management    RN Visit Note  02/05/2022 Name: Gabrielle Warren MRN: [id number] DOB: Oct 20, 1925  Subjective: Gabrielle Warren is a 88 y.o. year old female who is a primary care patient of Burns, Gabrielle Lick, MD. The care management team was consulted for assistance with disease management and care coordination needs.    Engaged with patient's daughter/ caregiver Gabrielle Warren, on [facility] DPR by telephone for follow up visit/ RN CM case closure in response to provider referral for case management and/or care coordination services.   Consent to Services:   Gabrielle Warren was given information about Care Management services 08/28/21 including:  Care Management services includes personalized support from designated clinical staff supervised by her physician, including individualized plan of care and coordination with other care providers 24/7 contact phone numbers for assistance for urgent and routine care needs. The patient may stop case management services at any time by phone call to the office staff.  Patient agreed to services and consent obtained.   Assessment: Review of patient past medical history, allergies, medications, health status, including review of consultants reports, laboratory and other test data, was performed as part of comprehensive evaluation and provision of chronic care management services.   SDOH (Social Determinants of Health) assessments and interventions performed:  SDOH Interventions    Flowsheet Row Most Recent Value  SDOH Interventions   Food Insecurity Interventions Intervention Not Indicated  [caregiver continues to deny food insecurity]  Transportation Interventions Intervention Not Indicated  [family continues to provide transportation]     Care Plan  Allergies  Allergen Reactions   Fosamax [Alendronate Sodium] Other (See Comments)    Reaction: Bleeding gums   Outpatient Encounter Medications as of 02/05/2022  Medication Sig   QUEtiapine (SEROQUEL)  50 MG tablet TAKE 1 TABLET(50 MG) BY MOUTH AT BEDTIME   amoxicillin (AMOXIL) 500 MG tablet TAKE 4 TABLETS BY MOUTH 1 HOUR PRIOR TO APPOINTMENT   aspirin EC 81 MG tablet Take 81 mg by mouth at bedtime.   bisacodyl (BISACODYL) 5 MG EC tablet Take 1 tablet (5 mg total) by mouth daily as needed for moderate constipation.   Calcium Carb-Cholecalciferol [phone] MG-UNIT CAPS Take 1 capsule by mouth 2 (two) times daily.   Cholecalciferol (VITAMIN D3) 50 MCG (2000 UT) TABS Take by mouth.   diazepam (VALIUM) 5 MG tablet Take 0.5 tablets (2.5 mg total) by mouth daily.   diltiazem (CARDIZEM CD) 120 MG 24 hr capsule TAKE 1 CAPSULE(120 MG) BY MOUTH DAILY   losartan (COZAAR) 100 MG tablet TAKE 1 TABLET(100 MG) BY MOUTH DAILY   metoprolol succinate (TOPROL-XL) 25 MG 24 hr tablet TAKE 1 TABLET(25 MG) BY MOUTH TWICE DAILY   Multiple Vitamin (MULTIVITAMIN WITH MINERALS) TABS tablet Take 1 tablet by mouth daily.   Multiple Vitamins-Minerals (ICAPS AREDS FORMULA PO) Take 2 tablets by mouth.   prazosin (MINIPRESS) 2 MG capsule TAKE 2 CAPSULES(4 MG) BY MOUTH TWICE DAILY   psyllium (METAMUCIL) 58.6 % powder Take 1 packet by mouth 3 (three) times daily as needed.   simvastatin (ZOCOR) 20 MG tablet TAKE 1 TABLET BY MOUTH EVERY DAY   STOOL SOFTENER 100 MG capsule TAKE 2 CAPSULES(200 MG) BY MOUTH DAILY FOR CONSTIPATION   No facility-administered encounter medications on file as of 02/05/2022.   Patient Active Problem List   Diagnosis Date Noted   Sleep difficulties 09/16/2021   Aortic atherosclerosis ([place]) 09/11/2020   Hypertensive urgency 04/25/2020   Murmur 09/15/2019   Decreased hearing 01/05/2019  History of breast cancer 04/29/2018   Urinary leakage 04/28/2018   Malignant neoplasm of central portion of right breast in female, estrogen receptor positive ([place]) 09/02/2016   Constipation 12/05/2015   Macular degeneration 06/15/2015   Back pain 06/15/2015   Dementia ([place]) 06/15/2015   Prediabetes 06/15/2015    Breast cancer, right breast ([place]) 09/05/2014   Allergic rhinitis 02/01/2014   Anemia 09/22/2013   Invasive ductal carcinoma of breast, stage 1 ([place]) 07/18/2011   CAD (coronary artery disease) 04/26/2011   Hyperlipidemia 07/03/2009   BRUIT 07/21/2008   Anxiety 09/23/2007   OSTEOARTHRITIS 09/23/2007   Osteoporosis 09/23/2007   Essential hypertension 07/25/2006   Conditions to be addressed/monitored: HTN and Dementia  Care Plan : RN Care Manager Plan of Care  Updates made by Gabrielle Royalty, RN since 02/05/2022 12:00 AM     Problem: Chronic Disease Management Needs   Priority: High     Long-Range Goal: Ongoing adherence to established plan of care for long term chronic disease management   Start Date: 08/30/2021  Expected End Date: 08/30/2022  Priority: High  Note:   Current Barriers:  Chronic Disease Management support and education needs related to HTN and dementia Caregiver fatigue/ stress: caregiver/ daughter assumes primary day-to-day care for patient while continuing to work; other family members (patient's sons) are local and assist as needed/ indicated 12/12/21: Caregiver reports "much better" now that she has hired private duty care assistance in home twice weekly; she confirms her siblings are helping out as well; caregiver confirms she even got to take a short vacation in May and reports her stress level is continuing to improve 02/05/22: caregiver continues to report she has private duty care at home actively working with patient Progressing dementia; need for assistance with ADL's and iADLs while caregiver is working 10/15/21- caregiver reports has completed and mailed forms for [facility] aid and attendance program- waiting to hear back 10/15/21- caregiver confirms she has an interview scheduled with private duty in-home care agency this Thursday 10/18/21 12/12/21: caregiver confirms VA forms in process; confirms she has hired private duty care agency for sitting/ care assistance: using  [employer]; they sit with patient twice a week on Tuesday and Friday: reports this is working out "great" 02/05/22: caregiver continues to report family working with [place] to identify any potential benefits patient may have through her husband's benefits- her brother (patient's son) is handling and she reports this is in progress Caregiver has updated her home for safety considerations due to patient's tendency to wander and leave home during night time hours Patient intermittent sleep disturbances- causes caregiver to lose sleep; caregiver reports seroquel helping, but this still occurs periodically 12/12/21: caregiver reports patient (and therefore, herself) sleeping much better than she was at time of last outreach- confirms seroquel "working very good"  RNCM Clinical Goal(s):  Patient/ caregiver will demonstrate ongoing health management independence as evidenced by adherence to plan of care for dementia and HTN        through collaboration with Consulting civil engineer, provider, and care team.   Interventions: 1:1 collaboration with primary care provider regarding development and update of comprehensive plan of care as evidenced by provider attestation and co-signature Inter-disciplinary care team collaboration (see longitudinal plan of care) Evaluation of current treatment plan related to  self management and patient's adherence to plan as established by provider Review of patient status, including review of consultants reports, relevant laboratory and other test results, and medications completed Discussed current clinical condition: caregiver again reports "  doing fine, overall no problems or concerns;" she denies clinical concerns Pain assessment updated: caregiver denies that patient is in pain Falls assessment updated: caregiver continues to deny new/ recent falls x 12 months- continues using cane and/ or rollator as indicated;  positive reinforcement provided with encouragement to continue efforts at  fall prevention; previously provided education around fall risks/ prevention reinforced Medications discussed: caregiver reports she continues to manage patient's medications; denies current concerns/ issues/ questions around medications; endorses adherence to taking all medications as prescribed Reviewed upcoming scheduled provider appointments: 03/20/22- PCP appointment; caregiver verbalizes awareness of appointment and verbalizes plans on taking patient as scheduled Discussed plans with caregiver for ongoing care management follow up- caregiver denies current care coordination/ care management needs and is agreeable to RN CM case closure today; verbalizes understanding to contact PCP or other care providers for any needs that arise in the future, and confirms she has contact information for all care providers     Dementia and HTN  (Status: 02/05/22: Goal Met.) Long Term Goal  Evaluation of current treatment plan related to HTN and Dementia,  self-management and patient's adherence to plan as established by provider. Confirmed patient has ongoing good appetite, eating well; trying to follow healthy diet Confirmed caregiver continues receiving good support from her brothers- they have been alternating care giving duties in between private duty care schedule and tells me this "is working out great;" she denies need for resources for caregiver fatigue/ respite Confirmed patient has adjusted well to having private duty caregivers in home assisting;  Confirmed no recent wandering, accidents at home; confirmed that patient "seems to be resting better" than before; continues taking seroquel as prescribed     Plan:  No further follow up required: caregiver/ daughter denies current care coordination/ care management needs and is agreeable to RN CM case closure today; case closure accordingly  Oneta Rack, RN, BSN, [employer] [phone]:  direct office

## 2022-02-06 ENCOUNTER — Telehealth: Payer: Self-pay | Admitting: Internal Medicine

## 2022-02-06 NOTE — Telephone Encounter (Signed)
Patients daughter called and would like a medication called in to help with her mothers agitation and mood.  Mother suffers from dementia.  Offered daughter an appointment for mom and she said that usually something will be called in for her mom.  Please contact daughter to discuss.

## 2022-02-07 NOTE — Telephone Encounter (Signed)
I would like her to come in for an appointment.  She is already on Seroquel and we really need to discuss her symptoms so we can come up with a better medication.

## 2022-02-08 NOTE — Telephone Encounter (Signed)
Pt has appt on Sept 13th to discuss.Gabrielle KitchenJohny Warren

## 2022-02-08 NOTE — Telephone Encounter (Signed)
I can not double book

## 2022-03-05 ENCOUNTER — Other Ambulatory Visit: Payer: Self-pay | Admitting: Internal Medicine

## 2022-03-20 ENCOUNTER — Ambulatory Visit (INDEPENDENT_AMBULATORY_CARE_PROVIDER_SITE_OTHER): Payer: Medicare Other | Admitting: Internal Medicine

## 2022-03-20 ENCOUNTER — Encounter: Payer: Self-pay | Admitting: Internal Medicine

## 2022-03-20 VITALS — BP 132/72 | HR 77 | Temp 98.3°F | Ht 59.0 in | Wt 95.2 lb

## 2022-03-20 DIAGNOSIS — G309 Alzheimer's disease, unspecified: Secondary | ICD-10-CM

## 2022-03-20 DIAGNOSIS — I2510 Atherosclerotic heart disease of native coronary artery without angina pectoris: Secondary | ICD-10-CM | POA: Diagnosis not present

## 2022-03-20 DIAGNOSIS — E7849 Other hyperlipidemia: Secondary | ICD-10-CM

## 2022-03-20 DIAGNOSIS — F419 Anxiety disorder, unspecified: Secondary | ICD-10-CM | POA: Diagnosis not present

## 2022-03-20 DIAGNOSIS — F0284 Dementia in other diseases classified elsewhere, unspecified severity, with anxiety: Secondary | ICD-10-CM

## 2022-03-20 DIAGNOSIS — K5900 Constipation, unspecified: Secondary | ICD-10-CM

## 2022-03-20 DIAGNOSIS — R7303 Prediabetes: Secondary | ICD-10-CM

## 2022-03-20 DIAGNOSIS — I10 Essential (primary) hypertension: Secondary | ICD-10-CM

## 2022-03-20 DIAGNOSIS — Z23 Encounter for immunization: Secondary | ICD-10-CM | POA: Diagnosis not present

## 2022-03-20 DIAGNOSIS — G479 Sleep disorder, unspecified: Secondary | ICD-10-CM

## 2022-03-20 LAB — CBC WITH DIFFERENTIAL/PLATELET
Basophils Absolute: 0.1 10*3/uL (ref 0.0–0.1)
Basophils Relative: 0.9 % (ref 0.0–3.0)
Eosinophils Absolute: 0.2 10*3/uL (ref 0.0–0.7)
Eosinophils Relative: 3 % (ref 0.0–5.0)
HCT: 30.7 % — ABNORMAL LOW (ref 36.0–46.0)
Hemoglobin: 10.6 g/dL — ABNORMAL LOW (ref 12.0–15.0)
Lymphocytes Relative: 27.6 % (ref 12.0–46.0)
Lymphs Abs: 1.8 10*3/uL (ref 0.7–4.0)
MCHC: 34.5 g/dL (ref 30.0–36.0)
MCV: 90.7 fl (ref 78.0–100.0)
Monocytes Absolute: 0.8 10*3/uL (ref 0.1–1.0)
Monocytes Relative: 11.5 % (ref 3.0–12.0)
Neutro Abs: 3.7 10*3/uL (ref 1.4–7.7)
Neutrophils Relative %: 57 % (ref 43.0–77.0)
Platelets: 199 10*3/uL (ref 150.0–400.0)
RBC: 3.38 Mil/uL — ABNORMAL LOW (ref 3.87–5.11)
RDW: 13.4 % (ref 11.5–15.5)
WBC: 6.6 10*3/uL (ref 4.0–10.5)

## 2022-03-20 LAB — COMPREHENSIVE METABOLIC PANEL
ALT: 13 U/L (ref 0–35)
AST: 19 U/L (ref 0–37)
Albumin: 3.6 g/dL (ref 3.5–5.2)
Alkaline Phosphatase: 61 U/L (ref 39–117)
BUN: 19 mg/dL (ref 6–23)
CO2: 29 mEq/L (ref 19–32)
Calcium: 9.5 mg/dL (ref 8.4–10.5)
Chloride: 101 mEq/L (ref 96–112)
Creatinine, Ser: 1.05 mg/dL (ref 0.40–1.20)
GFR: 45.71 mL/min — ABNORMAL LOW (ref >60.00)
Glucose, Bld: 115 mg/dL — ABNORMAL HIGH (ref 70–99)
Potassium: 3.8 mEq/L (ref 3.5–5.1)
Sodium: 136 mEq/L (ref 135–145)
Total Bilirubin: 0.4 mg/dL (ref 0.2–1.2)
Total Protein: 7.2 g/dL (ref 6.0–8.3)

## 2022-03-20 LAB — HEMOGLOBIN A1C: Hgb A1c MFr Bld: 5.8 % (ref 4.6–6.5)

## 2022-03-20 MED ORDER — PRAZOSIN HCL 2 MG PO CAPS: ORAL_CAPSULE | ORAL | 3 refills | Status: AC

## 2022-03-20 NOTE — Progress Notes (Signed)
Subjective:    Patient ID: Gabrielle Warren, female    DOB: Dec 04, 1925, 88 y.o.   MRN: [id number]     HPI Gabrielle Warren is here for follow up of her chronic medical problems, including htn, hld, prediabetes, dementia, anxiety, sleep difficulties.  She is here with her daughter who helps with the history because of the patient's dementia.  Chronic constipation - 2 stool softener nightly and MOM prn.  She typically will not take the fiber powder.  She does get discomfort from constipation.  Her daughter states that she does wake up some nights agitated and will wake her up in the middle of the night-this does not happen too often.  She does feel like the nighttime medication has helped.  Medications and allergies reviewed with patient and updated if appropriate.  Current Outpatient Medications on File Prior to Visit  Medication Sig Dispense Refill   amoxicillin (AMOXIL) 500 MG tablet TAKE 4 TABLETS BY MOUTH 1 HOUR PRIOR TO APPOINTMENT     aspirin EC 81 MG tablet Take 81 mg by mouth at bedtime.     bisacodyl (BISACODYL) 5 MG EC tablet Take 1 tablet (5 mg total) by mouth daily as needed for moderate constipation. 30 tablet 0   Calcium Carb-Cholecalciferol [phone] MG-UNIT CAPS Take 1 capsule by mouth 2 (two) times daily.     Cholecalciferol (VITAMIN D3) 50 MCG (2000 UT) TABS Take by mouth.     diazepam (VALIUM) 5 MG tablet Take 0.5 tablets (2.5 mg total) by mouth daily. 15 tablet 5   diltiazem (CARDIZEM CD) 120 MG 24 hr capsule TAKE 1 CAPSULE(120 MG) BY MOUTH DAILY 90 capsule 1   losartan (COZAAR) 100 MG tablet TAKE 1 TABLET(100 MG) BY MOUTH DAILY 90 tablet 1   metoprolol succinate (TOPROL-XL) 25 MG 24 hr tablet TAKE 1 TABLET(25 MG) BY MOUTH TWICE DAILY 60 tablet 1   Multiple Vitamin (MULTIVITAMIN WITH MINERALS) TABS tablet Take 1 tablet by mouth daily.     Multiple Vitamins-Minerals (ICAPS AREDS FORMULA PO) Take 2 tablets by mouth.     prazosin (MINIPRESS) 2 MG capsule TAKE 2  CAPSULES(4 MG) BY MOUTH TWICE DAILY 120 capsule 5   psyllium (METAMUCIL) 58.6 % powder Take 1 packet by mouth 3 (three) times daily as needed.     QUEtiapine (SEROQUEL) 50 MG tablet TAKE 1 TABLET(50 MG) BY MOUTH AT BEDTIME 90 tablet 0   simvastatin (ZOCOR) 20 MG tablet TAKE 1 TABLET BY MOUTH EVERY DAY 90 tablet 1   STOOL SOFTENER 100 MG capsule TAKE 2 CAPSULES(200 MG) BY MOUTH DAILY FOR CONSTIPATION 60 capsule 3   No current facility-administered medications on file prior to visit.     Review of Systems  Constitutional:  Negative for appetite change and fever.  Respiratory:  Negative for cough, shortness of breath and wheezing.   Cardiovascular:  Positive for palpitations. Negative for chest pain and leg swelling.  Gastrointestinal:  Positive for constipation.  Neurological:  Negative for dizziness, light-headedness and headaches.  Psychiatric/Behavioral:  Positive for agitation (a few times in the middle of the night - occasional).        Objective:   Vitals:   03/20/22 1326  BP: 132/72  Pulse: 77  Temp: 98.3 F (36.8 C)  SpO2: 99%   BP Readings from Last 3 Encounters:  03/20/22 132/72  09/17/21 134/72  03/19/21 (!) 150/72   Wt Readings from Last 3 Encounters:  03/20/22 95 lb 3.2 oz (43.2 kg)  09/17/21 90 lb (40.8 kg)  03/19/21 91 lb 9.6 oz (41.5 kg)   Body mass index is 19.23 kg/m.    Physical Exam Constitutional:      General: She is not in acute distress.    Appearance: Normal appearance.  HENT:     Head: Normocephalic and atraumatic.  Eyes:     Conjunctiva/sclera: Conjunctivae normal.  Cardiovascular:     Rate and Rhythm: Normal rate and regular rhythm.     Heart sounds: Normal heart sounds. No murmur heard. Pulmonary:     Effort: Pulmonary effort is normal. No respiratory distress.     Breath sounds: Normal breath sounds. No wheezing.  Musculoskeletal:     Cervical back: Neck supple.     Right lower leg: No edema.     Left lower leg: No edema.   Lymphadenopathy:     Cervical: No cervical adenopathy.  Skin:    General: Skin is warm and dry.     Findings: No rash.  Neurological:     Mental Status: She is alert. Mental status is at baseline.  Psychiatric:        Mood and Affect: Mood normal.        Behavior: Behavior normal.        Lab Results  Component Value Date   WBC 7.2 09/17/2021   HGB 10.1 (L) 09/17/2021   HCT 29.2 (L) 09/17/2021   PLT 163.0 09/17/2021   GLUCOSE 109 (H) 09/17/2021   CHOL 121 03/14/2020   TRIG 140 03/14/2020   HDL 52 03/14/2020   LDLCALC 47 03/14/2020   ALT 10 09/17/2021   AST 15 09/17/2021   NA 137 09/17/2021   K 4.2 09/17/2021   CL 101 09/17/2021   CREATININE 1.05 09/17/2021   BUN 21 09/17/2021   CO2 30 09/17/2021   TSH 3.23 03/19/2021   INR 1.1 04/11/2020   HGBA1C 5.8 09/17/2021     Assessment & Plan:    See Problem List for Assessment and Plan of chronic medical problems.

## 2022-03-20 NOTE — Assessment & Plan Note (Signed)
Chronic Symptoms are not ideally controlled with 2 stool softeners at night and milk of magnesia as needed She will not take the fiber powder consistently Can increase stool softener to 3 a day Encourage increased activity and fluids She does like prunes-try increasing protein intake Can retry smooth move tea-she does drink tea Discussed adding in dry fruit, increased fiber

## 2022-03-20 NOTE — Assessment & Plan Note (Signed)
Chronic Has seen Dr. Delice Lesch in the past Continue Seroquel 50 mg at bedtime-this has helped with some agitation.  She does wake up and occasionally in the middle of the night agitated, but her daughter states its not that often We can adjust dose if needed Appetite is okay-weight has increased some

## 2022-03-20 NOTE — Assessment & Plan Note (Signed)
Chronic BP well controlled Continue losartan 100 mg qd, cardizem 120 mg daily,metoprolol xl 25 mg bid, prazosin 4 mg bid cmp

## 2022-03-20 NOTE — Assessment & Plan Note (Signed)
Chronic Controlled, Stable Continue Seroquel 50 mg at bedtime, diazepam 2.5 mg every morning

## 2022-03-20 NOTE — Assessment & Plan Note (Signed)
Chronic Check a1c Low sugar / carb diet Stressed regular exercise  

## 2022-03-20 NOTE — Assessment & Plan Note (Signed)
Chronic Regular exercise and healthy diet encouraged Lipids have been controlled Continue simvastatin 20 mg daily

## 2022-03-20 NOTE — Assessment & Plan Note (Signed)
Chronic Related to dementia and agitation  Improved with Seroquel Continue Seroquel 50 mg nightly

## 2022-03-20 NOTE — Patient Instructions (Addendum)
    Flu vaccine given today.     Blood work was ordered.     Medications changes include :   none     Return in about 6 months (around 09/18/2022) for follow up - ok for virtual.

## 2022-03-25 ENCOUNTER — Encounter: Payer: Self-pay | Admitting: Internal Medicine

## 2022-03-27 ENCOUNTER — Other Ambulatory Visit: Payer: Self-pay | Admitting: Internal Medicine

## 2022-03-28 DIAGNOSIS — Z0289 Encounter for other administrative examinations: Secondary | ICD-10-CM

## 2022-03-29 ENCOUNTER — Ambulatory Visit (INDEPENDENT_AMBULATORY_CARE_PROVIDER_SITE_OTHER): Payer: Medicare Other | Admitting: *Deleted

## 2022-03-29 DIAGNOSIS — Z23 Encounter for immunization: Secondary | ICD-10-CM

## 2022-03-29 DIAGNOSIS — Z111 Encounter for screening for respiratory tuberculosis: Secondary | ICD-10-CM | POA: Diagnosis not present

## 2022-03-29 NOTE — Progress Notes (Signed)
Pls cosign for TB Skin test../lmb

## 2022-03-30 NOTE — Telephone Encounter (Signed)
Medication sent in two days ago

## 2022-04-01 ENCOUNTER — Ambulatory Visit: Payer: Medicare Other

## 2022-04-01 ENCOUNTER — Telehealth: Payer: Medicare Other

## 2022-04-01 LAB — TB SKIN TEST
Induration: 0 mm
TB Skin Test: NEGATIVE

## 2022-04-01 NOTE — Progress Notes (Signed)
Tuberculin skin test applied to left ventral forearm was negative with 45m induration.

## 2022-04-02 ENCOUNTER — Telehealth: Payer: Self-pay | Admitting: Internal Medicine

## 2022-04-02 NOTE — Telephone Encounter (Signed)
PT's son visits today in regards to getting FL2 forms filled out. PT's son stated that the facility requires one of the sections be changed so that PT can received care. He had let me know that Section 11 needed to be changed to specify "memory care". There is also an addendum that has been added with this form. All of these are in a paper clip in Dr.Burns' mailbox.  PT noted that they can come back and pick this up or if possible we could e-mail it to PT's daughter.

## 2022-04-02 NOTE — Telephone Encounter (Signed)
Form updated for memory care. Additional form placed in Dr. Quay Burow schedule to be signed.

## 2022-04-03 NOTE — Telephone Encounter (Signed)
Patients son picked up

## 2022-04-03 NOTE — Telephone Encounter (Signed)
Form completed and left up front for pick up

## 2022-04-08 ENCOUNTER — Inpatient Hospital Stay (HOSPITAL_COMMUNITY): Payer: Medicare Other | Admitting: Certified Registered Nurse Anesthetist

## 2022-04-08 ENCOUNTER — Other Ambulatory Visit: Payer: Self-pay

## 2022-04-08 ENCOUNTER — Encounter (HOSPITAL_COMMUNITY)
Admission: EM | Disposition: A | Discharge status: Skilled Nursing Facility | Payer: Self-pay | Source: Skilled Nursing Facility | Attending: Family Medicine

## 2022-04-08 ENCOUNTER — Emergency Department (HOSPITAL_COMMUNITY): Payer: Medicare Other

## 2022-04-08 ENCOUNTER — Inpatient Hospital Stay (HOSPITAL_COMMUNITY): Payer: Medicare Other

## 2022-04-08 ENCOUNTER — Inpatient Hospital Stay (HOSPITAL_COMMUNITY)
Admission: EM | Admit: 2022-04-08 | Discharge: 2022-04-11 | DRG: 480 | Disposition: A | Discharge status: Skilled Nursing Facility | Payer: Medicare Other | Source: Skilled Nursing Facility | Attending: Family Medicine | Admitting: Family Medicine

## 2022-04-08 ENCOUNTER — Encounter (HOSPITAL_COMMUNITY): Payer: Self-pay | Admitting: Internal Medicine

## 2022-04-08 DIAGNOSIS — G309 Alzheimer's disease, unspecified: Secondary | ICD-10-CM | POA: Diagnosis not present

## 2022-04-08 DIAGNOSIS — M810 Age-related osteoporosis without current pathological fracture: Secondary | ICD-10-CM | POA: Diagnosis present

## 2022-04-08 DIAGNOSIS — I10 Essential (primary) hypertension: Secondary | ICD-10-CM

## 2022-04-08 DIAGNOSIS — I2510 Atherosclerotic heart disease of native coronary artery without angina pectoris: Secondary | ICD-10-CM | POA: Diagnosis present

## 2022-04-08 DIAGNOSIS — Z87891 Personal history of nicotine dependence: Secondary | ICD-10-CM

## 2022-04-08 DIAGNOSIS — Z79899 Other long term (current) drug therapy: Secondary | ICD-10-CM

## 2022-04-08 DIAGNOSIS — F419 Anxiety disorder, unspecified: Secondary | ICD-10-CM | POA: Diagnosis present

## 2022-04-08 DIAGNOSIS — Z681 Body mass index (BMI) 19 or less, adult: Secondary | ICD-10-CM

## 2022-04-08 DIAGNOSIS — Z853 Personal history of malignant neoplasm of breast: Secondary | ICD-10-CM

## 2022-04-08 DIAGNOSIS — Z888 Allergy status to other drugs, medicaments and biological substances status: Secondary | ICD-10-CM | POA: Diagnosis not present

## 2022-04-08 DIAGNOSIS — Z823 Family history of stroke: Secondary | ICD-10-CM | POA: Diagnosis not present

## 2022-04-08 DIAGNOSIS — I341 Nonrheumatic mitral (valve) prolapse: Secondary | ICD-10-CM | POA: Diagnosis present

## 2022-04-08 DIAGNOSIS — D62 Acute posthemorrhagic anemia: Secondary | ICD-10-CM | POA: Diagnosis not present

## 2022-04-08 DIAGNOSIS — R739 Hyperglycemia, unspecified: Secondary | ICD-10-CM | POA: Diagnosis present

## 2022-04-08 DIAGNOSIS — F0284 Dementia in other diseases classified elsewhere, unspecified severity, with anxiety: Secondary | ICD-10-CM | POA: Diagnosis not present

## 2022-04-08 DIAGNOSIS — S7221XA Displaced subtrochanteric fracture of right femur, initial encounter for closed fracture: Secondary | ICD-10-CM | POA: Diagnosis present

## 2022-04-08 DIAGNOSIS — D649 Anemia, unspecified: Secondary | ICD-10-CM | POA: Diagnosis not present

## 2022-04-08 DIAGNOSIS — W010XXA Fall on same level from slipping, tripping and stumbling without subsequent striking against object, initial encounter: Secondary | ICD-10-CM | POA: Diagnosis present

## 2022-04-08 DIAGNOSIS — E7849 Other hyperlipidemia: Secondary | ICD-10-CM | POA: Diagnosis not present

## 2022-04-08 DIAGNOSIS — Y92009 Unspecified place in unspecified non-institutional (private) residence as the place of occurrence of the external cause: Secondary | ICD-10-CM | POA: Diagnosis not present

## 2022-04-08 DIAGNOSIS — S72001A Fracture of unspecified part of neck of right femur, initial encounter for closed fracture: Secondary | ICD-10-CM | POA: Diagnosis present

## 2022-04-08 DIAGNOSIS — E43 Unspecified severe protein-calorie malnutrition: Secondary | ICD-10-CM | POA: Diagnosis present

## 2022-04-08 DIAGNOSIS — M1990 Unspecified osteoarthritis, unspecified site: Secondary | ICD-10-CM | POA: Diagnosis present

## 2022-04-08 DIAGNOSIS — Z7982 Long term (current) use of aspirin: Secondary | ICD-10-CM

## 2022-04-08 DIAGNOSIS — E785 Hyperlipidemia, unspecified: Secondary | ICD-10-CM | POA: Diagnosis present

## 2022-04-08 DIAGNOSIS — S72141A Displaced intertrochanteric fracture of right femur, initial encounter for closed fracture: Secondary | ICD-10-CM

## 2022-04-08 DIAGNOSIS — F0394 Unspecified dementia, unspecified severity, with anxiety: Secondary | ICD-10-CM | POA: Diagnosis not present

## 2022-04-08 DIAGNOSIS — Z8249 Family history of ischemic heart disease and other diseases of the circulatory system: Secondary | ICD-10-CM

## 2022-04-08 DIAGNOSIS — Z66 Do not resuscitate: Secondary | ICD-10-CM | POA: Diagnosis present

## 2022-04-08 DIAGNOSIS — F0390 Unspecified dementia without behavioral disturbance: Secondary | ICD-10-CM | POA: Diagnosis present

## 2022-04-08 DIAGNOSIS — W19XXXA Unspecified fall, initial encounter: Principal | ICD-10-CM

## 2022-04-08 HISTORY — PX: FEMUR IM NAIL: SHX1597

## 2022-04-08 LAB — BASIC METABOLIC PANEL
Anion gap: 6 (ref 5–15)
BUN: 15 mg/dL (ref 8–23)
CO2: 25 mmol/L (ref 22–32)
Calcium: 8.9 mg/dL (ref 8.9–10.3)
Chloride: 103 mmol/L (ref 98–111)
Creatinine, Ser: 0.91 mg/dL (ref 0.44–1.00)
GFR, Estimated: 59 mL/min — ABNORMAL LOW (ref >60)
Glucose, Bld: 160 mg/dL — ABNORMAL HIGH (ref 70–99)
Potassium: 4.3 mmol/L (ref 3.5–5.1)
Sodium: 134 mmol/L — ABNORMAL LOW (ref 135–145)

## 2022-04-08 LAB — CBC WITH DIFFERENTIAL/PLATELET
Abs Immature Granulocytes: 0.08 10*3/uL — ABNORMAL HIGH (ref 0.00–0.07)
Basophils Absolute: 0.1 10*3/uL (ref 0.0–0.1)
Basophils Relative: 1 %
Eosinophils Absolute: 0.1 10*3/uL (ref 0.0–0.5)
Eosinophils Relative: 1 %
HCT: 30 % — ABNORMAL LOW (ref 36.0–46.0)
Hemoglobin: 9.9 g/dL — ABNORMAL LOW (ref 12.0–15.0)
Immature Granulocytes: 1 %
Lymphocytes Relative: 14 %
Lymphs Abs: 1.7 10*3/uL (ref 0.7–4.0)
MCH: 30.8 pg (ref 26.0–34.0)
MCHC: 33 g/dL (ref 30.0–36.0)
MCV: 93.5 fL (ref 80.0–100.0)
Monocytes Absolute: 0.9 10*3/uL (ref 0.1–1.0)
Monocytes Relative: 8 %
Neutro Abs: 9.3 10*3/uL — ABNORMAL HIGH (ref 1.7–7.7)
Neutrophils Relative %: 75 %
Platelets: 175 10*3/uL (ref 150–400)
RBC: 3.21 MIL/uL — ABNORMAL LOW (ref 3.87–5.11)
RDW: 13.1 % (ref 11.5–15.5)
WBC: 12.1 10*3/uL — ABNORMAL HIGH (ref 4.0–10.5)
nRBC: 0 % (ref 0.0–0.2)

## 2022-04-08 LAB — PROTIME-INR
INR: 1 (ref 0.8–1.2)
Prothrombin Time: 13.5 seconds (ref 11.4–15.2)

## 2022-04-08 LAB — IRON AND TIBC
Iron: 19 ug/dL — ABNORMAL LOW (ref 28–170)
Saturation Ratios: 11 % (ref 10.4–31.8)
TIBC: 179 ug/dL — ABNORMAL LOW (ref 250–450)
UIBC: 160 ug/dL

## 2022-04-08 SURGERY — INSERTION, INTRAMEDULLARY ROD, FEMUR
Anesthesia: General | Site: Hip | Laterality: Right

## 2022-04-08 MED ORDER — ALUM & MAG HYDROXIDE-SIMETH 200-200-20 MG/5ML PO SUSP: 30.0000 mL | ORAL | Status: DC | PRN

## 2022-04-08 MED ORDER — PHENOL 1.4 % MT LIQD: 1.0000 | OROMUCOSAL | Status: DC | PRN

## 2022-04-08 MED ORDER — OXYCODONE HCL 5 MG PO TABS
ORAL_TABLET | ORAL | Status: AC
  Filled 2022-04-08: qty 1

## 2022-04-08 MED ORDER — ROCURONIUM BROMIDE 10 MG/ML (PF) SYRINGE
PREFILLED_SYRINGE | INTRAVENOUS | Status: DC | PRN
  Administered 2022-04-08: 40 mg via INTRAVENOUS

## 2022-04-08 MED ORDER — PHENYLEPHRINE 80 MCG/ML (10ML) SYRINGE FOR IV PUSH (FOR BLOOD PRESSURE SUPPORT)
PREFILLED_SYRINGE | INTRAVENOUS | Status: DC | PRN
  Administered 2022-04-08 (×2): 40 ug via INTRAVENOUS

## 2022-04-08 MED ORDER — POLYETHYLENE GLYCOL 3350 17 G PO PACK
17.0000 g | PACK | Freq: Every day | ORAL | Status: DC | PRN
  Administered 2022-04-10 – 2022-04-11 (×2): 17 g via ORAL
  Filled 2022-04-08 (×2): qty 1

## 2022-04-08 MED ORDER — MORPHINE SULFATE (PF) 2 MG/ML IV SOLN: 0.5000 mg | INTRAVENOUS | Status: DC | PRN

## 2022-04-08 MED ORDER — POTASSIUM CHLORIDE IN NACL 20-0.9 MEQ/L-% IV SOLN
INTRAVENOUS | Status: DC
  Filled 2022-04-08: qty 1000

## 2022-04-08 MED ORDER — CEFAZOLIN SODIUM-DEXTROSE 2-4 GM/100ML-% IV SOLN
2.0000 g | Freq: Two times a day (BID) | INTRAVENOUS | Status: AC
  Administered 2022-04-09 (×2): 2 g via INTRAVENOUS
  Filled 2022-04-08 (×2): qty 100

## 2022-04-08 MED ORDER — ACETAMINOPHEN 160 MG/5ML PO SOLN
ORAL | Status: AC
  Filled 2022-04-08: qty 20.3

## 2022-04-08 MED ORDER — FENTANYL CITRATE PF 50 MCG/ML IJ SOSY
PREFILLED_SYRINGE | INTRAMUSCULAR | Status: AC
  Filled 2022-04-08: qty 1

## 2022-04-08 MED ORDER — ACETAMINOPHEN 325 MG PO TABS
325.0000 mg | ORAL_TABLET | Freq: Four times a day (QID) | ORAL | Status: DC | PRN
  Administered 2022-04-10: 650 mg via ORAL
  Filled 2022-04-08: qty 2

## 2022-04-08 MED ORDER — LACTATED RINGERS IV SOLN: INTRAVENOUS | Status: DC

## 2022-04-08 MED ORDER — MEPERIDINE HCL 50 MG/ML IJ SOLN: 6.2500 mg | INTRAMUSCULAR | Status: DC | PRN

## 2022-04-08 MED ORDER — ENOXAPARIN SODIUM 300 MG/3ML IJ SOLN
20.0000 mg | INTRAMUSCULAR | Status: DC
  Filled 2022-04-08 (×3): qty 0.2

## 2022-04-08 MED ORDER — CHLORHEXIDINE GLUCONATE 4 % EX LIQD
60.0000 mL | Freq: Once | CUTANEOUS | Status: AC
  Administered 2022-04-08: 4 via TOPICAL

## 2022-04-08 MED ORDER — PROPOFOL 10 MG/ML IV BOLUS
INTRAVENOUS | Status: DC | PRN
  Administered 2022-04-08: 80 mg via INTRAVENOUS
  Administered 2022-04-08: 80 ug/kg/min via INTRAVENOUS

## 2022-04-08 MED ORDER — BUPIVACAINE-EPINEPHRINE (PF) 0.5% -1:200000 IJ SOLN
INTRAMUSCULAR | Status: DC | PRN
  Administered 2022-04-08: 15 mL

## 2022-04-08 MED ORDER — MAGNESIUM CITRATE PO SOLN: 1.0000 | Freq: Once | ORAL | Status: DC | PRN

## 2022-04-08 MED ORDER — SENNA 8.6 MG PO TABS
1.0000 | ORAL_TABLET | Freq: Two times a day (BID) | ORAL | Status: DC
  Administered 2022-04-09 – 2022-04-11 (×5): 8.6 mg via ORAL
  Filled 2022-04-08 (×6): qty 1

## 2022-04-08 MED ORDER — ACETAMINOPHEN 325 MG PO TABS: 325.0000 mg | ORAL_TABLET | ORAL | Status: DC | PRN

## 2022-04-08 MED ORDER — CEFAZOLIN SODIUM-DEXTROSE 2-4 GM/100ML-% IV SOLN: 2.0000 g | INTRAVENOUS | Status: DC

## 2022-04-08 MED ORDER — POVIDONE-IODINE 10 % EX SWAB
2.0000 | Freq: Once | CUTANEOUS | Status: AC
  Administered 2022-04-08: 2 via TOPICAL

## 2022-04-08 MED ORDER — PROPOFOL 10 MG/ML IV BOLUS
INTRAVENOUS | Status: AC
  Filled 2022-04-08: qty 20

## 2022-04-08 MED ORDER — CEFAZOLIN SODIUM-DEXTROSE 2-4 GM/100ML-% IV SOLN
2.0000 g | INTRAVENOUS | Status: AC
  Administered 2022-04-08: 2 g via INTRAVENOUS
  Filled 2022-04-08: qty 100

## 2022-04-08 MED ORDER — HYDROCODONE-ACETAMINOPHEN 5-325 MG PO TABS
1.0000 | ORAL_TABLET | Freq: Four times a day (QID) | ORAL | Status: DC | PRN
  Administered 2022-04-08 – 2022-04-09 (×2): 1 via ORAL
  Filled 2022-04-08 (×2): qty 1

## 2022-04-08 MED ORDER — ONDANSETRON HCL 4 MG/2ML IJ SOLN: 4.0000 mg | Freq: Once | INTRAMUSCULAR | Status: DC | PRN

## 2022-04-08 MED ORDER — FENTANYL CITRATE PF 50 MCG/ML IJ SOSY
25.0000 ug | PREFILLED_SYRINGE | INTRAMUSCULAR | Status: DC | PRN
  Administered 2022-04-08 (×2): 50 ug via INTRAVENOUS

## 2022-04-08 MED ORDER — TRANEXAMIC ACID-NACL 1000-0.7 MG/100ML-% IV SOLN
1000.0000 mg | Freq: Once | INTRAVENOUS | Status: AC
  Administered 2022-04-08: 1000 mg via INTRAVENOUS
  Filled 2022-04-08: qty 100

## 2022-04-08 MED ORDER — FENTANYL CITRATE (PF) 100 MCG/2ML IJ SOLN
INTRAMUSCULAR | Status: DC | PRN
  Administered 2022-04-08: 25 ug via INTRAVENOUS
  Administered 2022-04-08: 50 ug via INTRAVENOUS
  Administered 2022-04-08: 25 ug via INTRAVENOUS

## 2022-04-08 MED ORDER — ONDANSETRON HCL 4 MG/2ML IJ SOLN
4.0000 mg | Freq: Four times a day (QID) | INTRAMUSCULAR | Status: DC | PRN
  Administered 2022-04-08: 4 mg via INTRAVENOUS
  Filled 2022-04-08: qty 2

## 2022-04-08 MED ORDER — OXYCODONE HCL 5 MG/5ML PO SOLN: 5.0000 mg | Freq: Once | ORAL | Status: AC | PRN

## 2022-04-08 MED ORDER — 0.9 % SODIUM CHLORIDE (POUR BTL) OPTIME
TOPICAL | Status: DC | PRN
  Administered 2022-04-08: 1000 mL

## 2022-04-08 MED ORDER — SUGAMMADEX SODIUM 200 MG/2ML IV SOLN
INTRAVENOUS | Status: DC | PRN
  Administered 2022-04-08: 100 mg via INTRAVENOUS

## 2022-04-08 MED ORDER — ONDANSETRON HCL 4 MG/2ML IJ SOLN: 4.0000 mg | Freq: Four times a day (QID) | INTRAMUSCULAR | Status: DC | PRN

## 2022-04-08 MED ORDER — MENTHOL 3 MG MT LOZG: 1.0000 | LOZENGE | OROMUCOSAL | Status: DC | PRN

## 2022-04-08 MED ORDER — ONDANSETRON HCL 4 MG PO TABS: 4.0000 mg | ORAL_TABLET | Freq: Four times a day (QID) | ORAL | Status: DC | PRN

## 2022-04-08 MED ORDER — FENTANYL CITRATE PF 50 MCG/ML IJ SOSY
25.0000 ug | PREFILLED_SYRINGE | INTRAMUSCULAR | Status: DC | PRN
  Filled 2022-04-08: qty 1

## 2022-04-08 MED ORDER — FERROUS SULFATE 325 (65 FE) MG PO TABS
325.0000 mg | ORAL_TABLET | Freq: Three times a day (TID) | ORAL | Status: DC
  Administered 2022-04-09 – 2022-04-11 (×9): 325 mg via ORAL
  Filled 2022-04-08 (×9): qty 1

## 2022-04-08 MED ORDER — PHENYLEPHRINE HCL-NACL 20-0.9 MG/250ML-% IV SOLN
INTRAVENOUS | Status: DC | PRN
  Administered 2022-04-08: 30 ug/min via INTRAVENOUS

## 2022-04-08 MED ORDER — ACETAMINOPHEN 500 MG PO TABS
500.0000 mg | ORAL_TABLET | Freq: Four times a day (QID) | ORAL | Status: AC
  Administered 2022-04-09 (×3): 500 mg via ORAL
  Filled 2022-04-08 (×3): qty 1

## 2022-04-08 MED ORDER — TRAMADOL HCL 50 MG PO TABS
50.0000 mg | ORAL_TABLET | Freq: Four times a day (QID) | ORAL | Status: DC | PRN
  Administered 2022-04-09: 50 mg via ORAL
  Filled 2022-04-08: qty 1

## 2022-04-08 MED ORDER — BISACODYL 10 MG RE SUPP: 10.0000 mg | Freq: Every day | RECTAL | Status: DC | PRN

## 2022-04-08 MED ORDER — BUPIVACAINE-EPINEPHRINE (PF) 0.5% -1:200000 IJ SOLN
INTRAMUSCULAR | Status: AC
  Filled 2022-04-08: qty 30

## 2022-04-08 MED ORDER — OXYCODONE HCL 5 MG PO TABS
5.0000 mg | ORAL_TABLET | Freq: Once | ORAL | Status: AC | PRN
  Administered 2022-04-08: 5 mg via ORAL

## 2022-04-08 MED ORDER — LIDOCAINE 2% (20 MG/ML) 5 ML SYRINGE
INTRAMUSCULAR | Status: DC | PRN
  Administered 2022-04-08: 40 mg via INTRAVENOUS

## 2022-04-08 MED ORDER — ACETAMINOPHEN 160 MG/5ML PO SOLN
325.0000 mg | ORAL | Status: DC | PRN
  Administered 2022-04-08: 650 mg via ORAL

## 2022-04-08 MED ORDER — ONDANSETRON HCL 4 MG/2ML IJ SOLN
INTRAMUSCULAR | Status: DC | PRN
  Administered 2022-04-08: 4 mg via INTRAVENOUS

## 2022-04-08 MED ORDER — TRANEXAMIC ACID-NACL 1000-0.7 MG/100ML-% IV SOLN
1000.0000 mg | INTRAVENOUS | Status: AC
  Administered 2022-04-08: 1000 mg via INTRAVENOUS
  Filled 2022-04-08: qty 100

## 2022-04-08 MED ORDER — FENTANYL CITRATE (PF) 100 MCG/2ML IJ SOLN
INTRAMUSCULAR | Status: AC
  Filled 2022-04-08: qty 2

## 2022-04-08 SURGICAL SUPPLY — 51 items
BAG COUNTER SPONGE SURGICOUNT (BAG) IMPLANT
BAG SPEC THK2 15X12 ZIP CLS (MISCELLANEOUS) ×1
BAG SPNG CNTER NS LX DISP (BAG)
BAG ZIPLOCK 12X15 (MISCELLANEOUS) ×1 IMPLANT
BIT DRILL CANN 16 (BIT) IMPLANT
BIT DRILL CANN STP 6/9 HIP (BIT) IMPLANT
BIT DRILL SHORT 4.2 (BIT) IMPLANT
BIT DRILL TAPERED 10 (BIT) IMPLANT
BLADE HELICAL TFNA 90 (Anchor) IMPLANT
BNDG CMPR 5X4 CHSV STRCH STRL (GAUZE/BANDAGES/DRESSINGS) ×1
BNDG COHESIVE 4X5 TAN STRL LF (GAUZE/BANDAGES/DRESSINGS) ×1 IMPLANT
BNDG GAUZE DERMACEA FLUFF 4 (GAUZE/BANDAGES/DRESSINGS) ×1 IMPLANT
BNDG GZE DERMACEA 4 6PLY (GAUZE/BANDAGES/DRESSINGS) ×1
COVER PERINEAL POST (MISCELLANEOUS) ×2 IMPLANT
COVER SURGICAL LIGHT HANDLE (MISCELLANEOUS) ×2 IMPLANT
DRAPE STERI IOBAN 125X83 (DRAPES) ×2 IMPLANT
DRESSING MEPILEX FLEX 4X4 (GAUZE/BANDAGES/DRESSINGS) ×4 IMPLANT
DRILL BIT SHORT 4.2 (BIT) ×1
DRSG MEPILEX FLEX 4X4 (GAUZE/BANDAGES/DRESSINGS) ×3
DRSG MEPILEX POST OP 4X8 (GAUZE/BANDAGES/DRESSINGS) ×1 IMPLANT
DURAPREP 26ML APPLICATOR (WOUND CARE) ×1 IMPLANT
ELECT REM PT RETURN 15FT ADLT (MISCELLANEOUS) ×1 IMPLANT
GAUZE SPONGE 4X4 12PLY STRL (GAUZE/BANDAGES/DRESSINGS) ×1 IMPLANT
GAUZE XEROFORM 5X9 LF (GAUZE/BANDAGES/DRESSINGS) ×1 IMPLANT
GLOVE BIO SURGEON STRL SZ7 (GLOVE) ×1 IMPLANT
GLOVE BIOGEL PI IND STRL 7.0 (GLOVE) ×1 IMPLANT
GLOVE BIOGEL PI IND STRL 8 (GLOVE) ×1 IMPLANT
GLOVE ORTHO TXT STRL SZ7.5 (GLOVE) ×2 IMPLANT
GOWN STRL REUS W/ TWL LRG LVL3 (GOWN DISPOSABLE) ×2 IMPLANT
GOWN STRL REUS W/TWL LRG LVL3 (GOWN DISPOSABLE) ×2
GUIDEWIRE 3.2X400 (WIRE) IMPLANT
KIT BASIN OR (CUSTOM PROCEDURE TRAY) ×1 IMPLANT
KIT TURNOVER KIT A (KITS) IMPLANT
NAIL CAN TFNA 9MM 130DEG HIP (Nail) IMPLANT
NS IRRIG 1000ML POUR BTL (IV SOLUTION) ×1 IMPLANT
PACK GENERAL/GYN (CUSTOM PROCEDURE TRAY) ×2 IMPLANT
PROTECTOR NERVE ULNAR (MISCELLANEOUS) ×2 IMPLANT
REAMER ROD DEEP FLUTE 2.5X950 (INSTRUMENTS) IMPLANT
SCREW LOCK IM 5X36 (Screw) ×1 IMPLANT
SCREW LOCK X25 36X5X IM (Screw) IMPLANT
STAPLER VISISTAT 35W (STAPLE) IMPLANT
STRIP CLOSURE SKIN 1/2X4 (GAUZE/BANDAGES/DRESSINGS) ×1 IMPLANT
SUT MNCRL AB 4-0 PS2 18 (SUTURE) ×1 IMPLANT
SUT VIC AB 0 CT1 27 (SUTURE)
SUT VIC AB 0 CT1 27XBRD ANTBC (SUTURE) IMPLANT
SUT VIC AB 2-0 CT1 27 (SUTURE) ×1
SUT VIC AB 2-0 CT1 TAPERPNT 27 (SUTURE) IMPLANT
SUT VIC AB 3-0 SH 27 (SUTURE) ×1
SUT VIC AB 3-0 SH 27X BRD (SUTURE) ×1 IMPLANT
TOWEL OR 17X26 10 PK STRL BLUE (TOWEL DISPOSABLE) ×1 IMPLANT
WATER STERILE IRR 1000ML POUR (IV SOLUTION) ×2 IMPLANT

## 2022-04-08 NOTE — Anesthesia Preprocedure Evaluation (Addendum)
Anesthesia Evaluation  Patient identified by MRN, date of birth, ID band Patient awake    Reviewed: Allergy & Precautions, H&P , NPO status , Patient's Chart, lab work & pertinent test results  Airway Mallampati: II  TM Distance: >3 FB Neck ROM: Full    Dental no notable dental hx. (+) Chipped, Missing, Dental Advisory Given, Poor Dentition   Pulmonary neg pulmonary ROS, former smoker,    Pulmonary exam normal breath sounds clear to auscultation       Cardiovascular Exercise Tolerance: Good hypertension, Pt. on medications and Pt. on home beta blockers + CAD  Normal cardiovascular exam Rhythm:Regular Rate:Normal     Neuro/Psych PSYCHIATRIC DISORDERS Anxiety Dementia negative neurological ROS  negative psych ROS   GI/Hepatic negative GI ROS, Neg liver ROS,   Endo/Other  negative endocrine ROS  Renal/GU negative Renal ROS  negative genitourinary   Musculoskeletal negative musculoskeletal ROS (+) Arthritis , Osteoarthritis,    Abdominal   Peds negative pediatric ROS (+)  Hematology  (+) Blood dyscrasia, anemia ,   Anesthesia Other Findings   Reproductive/Obstetrics negative OB ROS                            Anesthesia Physical Anesthesia Plan  ASA: 4  Anesthesia Plan: General   Post-op Pain Management:    Induction: Intravenous  PONV Risk Score and Plan: 3 and Ondansetron, Dexamethasone, Treatment may vary due to age or medical condition and TIVA  Airway Management Planned: Oral ETT  Additional Equipment: None  Intra-op Plan:   Post-operative Plan: Extubation in OR and Possible Post-op intubation/ventilation  Informed Consent: I have reviewed the patients History and Physical, chart, labs and discussed the procedure including the risks, benefits and alternatives for the proposed anesthesia with the patient or authorized representative who has indicated his/her understanding and  acceptance.    Discussed DNR with power of attorney and Continue DNR.     Plan Discussed with: Anesthesiologist and CRNA  Anesthesia Plan Comments:        Anesthesia Quick Evaluation

## 2022-04-08 NOTE — Discharge Instructions (Signed)
Diet: As you were doing prior to hospitalization   Shower:  May shower but keep the wounds dry, use an occlusive plastic wrap, NO SOAKING IN TUB.  If the bandage gets wet, change with a clean dry gauze.  If you have a splint on, leave the splint in place and keep the splint dry with a plastic bag.  Dressing:  You may change your dressing 3-5 days after surgery, unless you have a splint.  If you have a splint, then just leave the splint in place and we will change your bandages during your first follow-up appointment.    If you had hand or foot surgery, we will plan to remove your stitches in about 2 weeks in the office.  For all other surgeries, there are sticky tapes (steri-strips) on your wounds and all the stitches are absorbable.  Leave the steri-strips in place when changing your dressings, they will peel off with time, usually 2-3 weeks.  Activity:  Increase activity slowly as tolerated, but follow the weight bearing instructions below.  The rules on driving is that you can not be taking narcotics while you drive, and you must feel in control of the vehicle.    Weight Bearing:  ok to weight bear as tolerated on right leg  To prevent constipation: you may use a stool softener such as -  Colace (over the counter) 100 mg by mouth twice a day  Drink plenty of fluids (prune juice may be helpful) and high fiber foods Miralax (over the counter) for constipation as needed.    Itching:  If you experience itching with your medications, try taking only a single pain pill, or even half a pain pill at a time.  You may take up to 10 pain pills per day, and you can also use benadryl over the counter for itching or also to help with sleep.   Precautions:  If you experience chest pain or shortness of breath - call 911 immediately for transfer to the hospital emergency department!!  If you develop a fever greater that 101 F, purulent drainage from wound, increased redness or drainage from wound, or calf  pain -- Call the office at [phone]                                                Follow- Up Appointment:  Please call for an appointment to be seen in 2 weeks [place] - [phone]     

## 2022-04-08 NOTE — Plan of Care (Signed)
Plan of care discussed with family at bedside.

## 2022-04-08 NOTE — ED Notes (Signed)
Bed alarm placed for pt's safety.

## 2022-04-08 NOTE — H&P (Signed)
History and Physical    Patient: Gabrielle Warren DOB: Aug 05, 1925 DOA: 04/08/2022 DOS: the patient was seen and examined on 04/08/2022 PCP: Binnie Rail, MD  Patient coming from: Home  Chief Complaint:  Chief Complaint  Patient presents with   Fall   Hip Pain   HPI: Gabrielle Warren is a 88 y.o. female with medical history significant of dementia, HLD, HTN, anxiety. Presenting with right hip pain. She was getting out of her daughter's car this morning when she tripped and landed on her right side. She didn't hit her head. She didn't suffer LOC. She hasn't had any other recent falls. She normally uses a rollator when walking. She was unable to get up on her own. Her family became concerned and called for EMS. She denies any other aggravating or alleviating factors.   Review of Systems: As mentioned in the history of present illness. All other systems reviewed and are negative. Past Medical History:  Diagnosis Date   Anemia 12/10/2011   ANXIETY 09/23/2007   Arthritis    Atrial tachycardia ([place])    Breast cancer ([place])    HYPERLIPIDEMIA 07/03/2009   HYPERTENSION 07/25/2006   Macular degeneration    MVP (mitral valve prolapse)    Osteoarthritis    Osteoporosis    Prediabetes 06/15/2015   Past Surgical History:  Procedure Laterality Date   BREAST LUMPECTOMY Right 08/08/2011   BREAST SURGERY  08/08/11   lumpectomy   CARPAL TUNNEL RELEASE  09/2004   CATARACT EXTRACTION  04/2000, 03/2002   Social History:  reports that she quit smoking about 27 years ago. She has never used smokeless tobacco. She reports current alcohol use. She reports that she does not use drugs.  Allergies  Allergen Reactions   Fosamax [Alendronate Sodium] Other (See Comments)    Reaction: Bleeding gums    Family History  Problem Relation Age of Onset   Heart failure Mother    Stroke Father    Hypertension Other    Colon cancer Neg Hx     Prior to Admission medications    Medication Sig Start Date End Date Taking? Authorizing Provider  diazepam (VALIUM) 5 MG tablet TAKE 1/2 TABLET(2.5 MG) BY MOUTH DAILY 03/28/22   Burns, Claudina Lick, MD  amoxicillin (AMOXIL) 500 MG tablet TAKE 4 TABLETS BY MOUTH 1 HOUR PRIOR TO APPOINTMENT 09/29/19   [provider]  aspirin EC 81 MG tablet Take 81 mg by mouth at bedtime.    [provider]  bisacodyl (BISACODYL) 5 MG EC tablet Take 1 tablet (5 mg total) by mouth daily as needed for moderate constipation. 04/19/19   Binnie Rail, MD  Calcium Carb-Cholecalciferol [phone] MG-UNIT CAPS Take 1 capsule by mouth 2 (two) times daily.    [provider]  Cholecalciferol (VITAMIN D3) 50 MCG (2000 UT) TABS Take by mouth.    [provider]  diltiazem (CARDIZEM CD) 120 MG 24 hr capsule TAKE 1 CAPSULE(120 MG) BY MOUTH DAILY 09/17/21   Binnie Rail, MD  losartan (COZAAR) 100 MG tablet TAKE 1 TABLET(100 MG) BY MOUTH DAILY 09/17/21   Binnie Rail, MD  metoprolol succinate (TOPROL-XL) 25 MG 24 hr tablet TAKE 1 TABLET(25 MG) BY MOUTH TWICE DAILY 10/09/21   Minus Breeding, MD  Multiple Vitamin (MULTIVITAMIN WITH MINERALS) TABS tablet Take 1 tablet by mouth daily.    [provider]  Multiple Vitamins-Minerals (ICAPS AREDS FORMULA PO) Take 2 tablets by mouth.    [provider]  prazosin (MINIPRESS) 2 MG capsule TAKE 2 CAPSULES(4 MG) BY MOUTH TWICE DAILY 03/20/22   Binnie Rail, MD  psyllium (METAMUCIL) 58.6 % powder Take 1 packet by mouth 3 (three) times daily as needed.    [provider]  QUEtiapine (SEROQUEL) 50 MG tablet TAKE 1 TABLET(50 MG) BY MOUTH AT BEDTIME 03/05/22   Burns, Claudina Lick, MD  simvastatin (ZOCOR) 20 MG tablet TAKE 1 TABLET BY MOUTH EVERY DAY 03/28/22   Burns, Claudina Lick, MD  STOOL SOFTENER 100 MG capsule TAKE 2 CAPSULES(200 MG) BY MOUTH DAILY FOR CONSTIPATION 08/16/19   Binnie Rail, MD    Physical Exam: Vitals:   04/08/22 1232 04/08/22 1245  BP: (!) 179/65 (!) 164/68   Pulse: 65 61  Resp: 13 14  Temp: 97.6 F (36.4 C)   TempSrc: Oral   SpO2: 98% 97%   General: 88 y.o. female resting in bed in NAD Eyes: PERRL, normal sclera ENMT: Nares patent w/o discharge, orophaynx clear, dentition normal, ears w/o discharge/lesions/ulcers Neck: Supple, trachea midline Cardiovascular: RRR, +S1, S2, no m/g/r, equal pulses throughout Respiratory: CTABL, no w/r/r, normal WOB GI: BS+, NDNT, no masses noted, no organomegaly noted MSK: No e/c/c; limited ROM on right hip d/t pain Neuro: A&O x 2, no focal deficits Psyc: Appropriate interaction and affect, calm/cooperative  Data Reviewed:  Results for orders placed or performed during the hospital encounter of 04/08/22 (from the past 24 hour(s))  Basic metabolic panel     Status: Abnormal   Collection Time: 04/08/22 12:50 PM  Result Value Ref Range   Sodium 134 (L) 135 - 145 mmol/L   Potassium 4.3 3.5 - 5.1 mmol/L   Chloride 103 98 - 111 mmol/L   CO2 25 22 - 32 mmol/L   Glucose, Bld 160 (H) 70 - 99 mg/dL   BUN 15 8 - 23 mg/dL   Creatinine, Ser 0.91 0.44 - 1.00 mg/dL   Calcium 8.9 8.9 - 10.3 mg/dL   GFR, Estimated 59 (L) >60 mL/min   Anion gap 6 5 - 15  CBC with Differential     Status: Abnormal   Collection Time: 04/08/22 12:50 PM  Result Value Ref Range   WBC 12.1 (H) 4.0 - 10.5 K/uL   RBC 3.21 (L) 3.87 - 5.11 MIL/uL   Hemoglobin 9.9 (L) 12.0 - 15.0 g/dL   HCT 30.0 (L) 36.0 - 46.0 %   MCV 93.5 80.0 - 100.0 fL   MCH 30.8 26.0 - 34.0 pg   MCHC 33.0 30.0 - 36.0 g/dL   RDW 13.1 11.5 - 15.5 %   Platelets 175 150 - 400 K/uL   nRBC 0.0 0.0 - 0.2 %   Neutrophils Relative % 75 %   Neutro Abs 9.3 (H) 1.7 - 7.7 K/uL   Lymphocytes Relative 14 %   Lymphs Abs 1.7 0.7 - 4.0 K/uL   Monocytes Relative 8 %   Monocytes Absolute 0.9 0.1 - 1.0 K/uL   Eosinophils Relative 1 %   Eosinophils Absolute 0.1 0.0 - 0.5 K/uL   Basophils Relative 1 %   Basophils Absolute 0.1 0.0 - 0.1 K/uL   Immature Granulocytes 1 %   Abs  Immature Granulocytes 0.08 (H) 0.00 - 0.07 K/uL  Protime-INR     Status: None   Collection Time: 04/08/22 12:50 PM  Result Value Ref Range   Prothrombin Time 13.5 11.4 - 15.2 seconds   INR 1.0 0.8 - 1.2  Type and screen [place]  Status: None   Collection Time: 04/08/22 12:50 PM  Result Value Ref Range   ABO/RH(D) O POS    Antibody Screen NEG    Sample Expiration      04/11/2022,2359 Performed at [facility], [place] [address], [place], [place] [address]    [facility] No acute or traumatic finding. Age related volume loss. Chronic small-vessel ischemic changes of the cerebral hemispheric white matter.  CT C-spine No acute or traumatic finding. Chronic degenerative spondylosis and facet arthropathy as outlined above.  XR Right Hip Severely displaced and comminuted intertrochanteric fracture of proximal right femur.  Assessment and Plan: Right hip fracture Fall     - admit to inpt, [facility] (Dr Mardelle Matte) to take to OR tonight, appreciate assistance     - NPO for now     - pain control     - PT/OT tomorrow     - TOC consult  HTN     - resume home regimen when confirmed  HLD     - resume home regimen when confirmed  Normocytic anemia     - no evidence of bleed     - check iron studies  Anxiety     - continue home regimen when confirmed  Dementia     - continue home regimen when confirmed  Hyperglycemia     - no Hx of DM     - check A1c  Advance Care Planning:   Code Status: DNR  Consults: Orthopedics Mardelle Matte)  Family Communication: w/ sons at bedside  Severity of Illness: The appropriate patient status for this patient is INPATIENT. Inpatient status is judged to be reasonable and necessary in order to provide the required intensity of service to ensure the patient's safety. The patient's presenting symptoms, physical exam findings, and initial radiographic and laboratory data in the context of their  chronic comorbidities is felt to place them at high risk for further clinical deterioration. Furthermore, it is not anticipated that the patient will be medically stable for discharge from the hospital within 2 midnights of admission.   * I certify that at the point of admission it is my clinical judgment that the patient will require inpatient hospital care spanning beyond 2 midnights from the point of admission due to high intensity of service, high risk for further deterioration and high frequency of surveillance required.*   Author: Jonnie Finner, DO 04/08/2022 2:34 PM  For on call review [email or web address].

## 2022-04-08 NOTE — Transfer of Care (Signed)
2Immediate Anesthesia Transfer of Care Note  Patient: Nastassia Bazaldua Skowron  Procedure(s) Performed: INTRAMEDULLARY (IM) NAIL FEMORAL (Right: Hip)  Patient Location: PACU  Anesthesia Type:General  Level of Consciousness: awake, drowsy and patient cooperative  Airway & Oxygen Therapy: Patient Spontanous Breathing and Patient connected to face mask oxygen  Post-op Assessment: Report given to RN and Post -op Vital signs reviewed and stable  Post vital signs: Reviewed and stable  Last Vitals:  Vitals Value Taken Time  BP 155/52 04/08/22 1932  Temp    Pulse 69 04/08/22 1937  Resp 15 04/08/22 1937  SpO2 99 % 04/08/22 1937  Vitals shown include unvalidated device data.  Last Pain:  Vitals:   04/08/22 1646  TempSrc: Oral  PainSc:          Complications: No notable events documented.

## 2022-04-08 NOTE — ED Notes (Signed)
Report given to short stay.  

## 2022-04-08 NOTE — ED Provider Notes (Signed)
[facility] DEPT Provider Note   CSN: [id number] Arrival date & time: 04/08/22  1212     History  Chief Complaint  Patient presents with   Fall   Hip Pain    Gabrielle Warren is a 88 y.o. female.  HPI Patient presents after a fall via EMS.  History is obtained by the patient and EMS providers.  Similarly patient was in her usual state of health when she fell, since that time has been nonambulatory, with pain in her right hip.  She is unsure of head trauma, but has had nausea, vomiting since the event.  EMS reports no hemodynamic instability in route.    Home Medications Prior to Admission medications   Medication Sig Start Date End Date Taking? Authorizing Provider  diazepam (VALIUM) 5 MG tablet TAKE 1/2 TABLET(2.5 MG) BY MOUTH DAILY 03/28/22   Burns, Claudina Lick, MD  amoxicillin (AMOXIL) 500 MG tablet TAKE 4 TABLETS BY MOUTH 1 HOUR PRIOR TO APPOINTMENT 09/29/19   [provider]  aspirin EC 81 MG tablet Take 81 mg by mouth at bedtime.    [provider]  bisacodyl (BISACODYL) 5 MG EC tablet Take 1 tablet (5 mg total) by mouth daily as needed for moderate constipation. 04/19/19   Binnie Rail, MD  Calcium Carb-Cholecalciferol [phone] MG-UNIT CAPS Take 1 capsule by mouth 2 (two) times daily.    [provider]  Cholecalciferol (VITAMIN D3) 50 MCG (2000 UT) TABS Take by mouth.    [provider]  diltiazem (CARDIZEM CD) 120 MG 24 hr capsule TAKE 1 CAPSULE(120 MG) BY MOUTH DAILY 09/17/21   Binnie Rail, MD  losartan (COZAAR) 100 MG tablet TAKE 1 TABLET(100 MG) BY MOUTH DAILY 09/17/21   Binnie Rail, MD  metoprolol succinate (TOPROL-XL) 25 MG 24 hr tablet TAKE 1 TABLET(25 MG) BY MOUTH TWICE DAILY 10/09/21   Minus Breeding, MD  Multiple Vitamin (MULTIVITAMIN WITH MINERALS) TABS tablet Take 1 tablet by mouth daily.    [provider]  Multiple Vitamins-Minerals (ICAPS AREDS FORMULA PO) Take 2 tablets by mouth.     [provider]  prazosin (MINIPRESS) 2 MG capsule TAKE 2 CAPSULES(4 MG) BY MOUTH TWICE DAILY 03/20/22   Binnie Rail, MD  psyllium (METAMUCIL) 58.6 % powder Take 1 packet by mouth 3 (three) times daily as needed.    [provider]  QUEtiapine (SEROQUEL) 50 MG tablet TAKE 1 TABLET(50 MG) BY MOUTH AT BEDTIME 03/05/22   Burns, Claudina Lick, MD  simvastatin (ZOCOR) 20 MG tablet TAKE 1 TABLET BY MOUTH EVERY DAY 03/28/22   Burns, Claudina Lick, MD  STOOL SOFTENER 100 MG capsule TAKE 2 CAPSULES(200 MG) BY MOUTH DAILY FOR CONSTIPATION 08/16/19   Burns, Claudina Lick, MD      Allergies    Fosamax [alendronate sodium]    Review of Systems   Review of Systems  All other systems reviewed and are negative.   Physical Exam Updated Vital Signs BP (!) 164/68   Pulse 61   Temp 97.6 F (36.4 C) (Oral)   Resp 14   SpO2 97%  Physical Exam Vitals and nursing note reviewed.  Constitutional:      General: She is not in acute distress.    Appearance: She is well-developed.  HENT:     Head: Normocephalic and atraumatic.     Comments: No obvious trauma about the head, face, neck.  Cervical collar in place. Eyes:     Conjunctiva/sclera: Conjunctivae  normal.  Cardiovascular:     Rate and Rhythm: Normal rate and regular rhythm.  Pulmonary:     Effort: Pulmonary effort is normal. No respiratory distress.     Breath sounds: Normal breath sounds. No stridor.  Abdominal:     General: There is no distension.  Musculoskeletal:       Arms:  Skin:    General: Skin is warm and dry.  Neurological:     Mental Status: She is alert and oriented to person, place, and time.     Cranial Nerves: No cranial nerve deficit.     Comments: Age-appropriate atrophy, otherwise unremarkable neuro exam including preserved sensation and motion of the distal right foot.  Psychiatric:        Mood and Affect: Mood normal.     ED Results / Procedures / Treatments   Labs (all labs ordered are listed, but only abnormal  results are displayed) Labs Reviewed  BASIC METABOLIC PANEL - Abnormal; Notable for the following components:      Result Value   Sodium 134 (*)    Glucose, Bld 160 (*)    GFR, Estimated 59 (*)    All other components within normal limits  CBC WITH DIFFERENTIAL/PLATELET - Abnormal; Notable for the following components:   WBC 12.1 (*)    RBC 3.21 (*)    Hemoglobin 9.9 (*)    HCT 30.0 (*)    Neutro Abs 9.3 (*)    Abs Immature Granulocytes 0.08 (*)    All other components within normal limits  PROTIME-INR  TYPE AND SCREEN  ABO/RH    EKG EKG Interpretation  Date/Time:  Monday April 08 2022 12:39:59 EDT Ventricular Rate:  65 PR Interval:  56 QRS Duration: 102 QT Interval:  449 QTC Calculation: 467 R Axis:   53 Text Interpretation: Sinus rhythm Atrial premature complex Short PR interval Otherwise within normal limits Confirmed by Carmin Muskrat [phone]) on 04/08/2022 1:08:02 PM  Radiology CT HEAD WO CONTRAST  Result Date: 04/08/2022 CLINICAL DATA:  Witnessed fall EXAM: CT HEAD WITHOUT CONTRAST TECHNIQUE: Contiguous axial images were obtained from the base of the skull through the vertex without intravenous contrast. RADIATION DOSE REDUCTION: This exam was performed according to the departmental dose-optimization program which includes automated exposure control, adjustment of the mA and/or kV according to patient size and/or use of iterative reconstruction technique. COMPARISON:  02/10/2020 FINDINGS: Brain: Age related volume loss. Chronic small-vessel ischemic changes of the cerebral hemispheric white matter. No sign of acute infarction, mass lesion, hemorrhage, hydrocephalus or extra-axial collection. Vascular: There is atherosclerotic calcification of the major vessels at the base of the brain. Skull: Negative Sinuses/Orbits: Clear/normal Other: None IMPRESSION: No acute or traumatic finding. Age related volume loss. Chronic small-vessel ischemic changes of the cerebral hemispheric  white matter. Electronically Signed   By: Nelson Chimes M.D.   On: 04/08/2022 13:32   CT CERVICAL SPINE WO CONTRAST  Result Date: 04/08/2022 CLINICAL DATA:  Witnessed fall EXAM: CT CERVICAL SPINE WITHOUT CONTRAST TECHNIQUE: Multidetector CT imaging of the cervical spine was performed without intravenous contrast. Multiplanar CT image reconstructions were also generated. RADIATION DOSE REDUCTION: This exam was performed according to the departmental dose-optimization program which includes automated exposure control, adjustment of the mA and/or kV according to patient size and/or use of iterative reconstruction technique. COMPARISON:  None Available. FINDINGS: Alignment: Chronic straightening and kyphotic curvature in the cervical region probably due to facet arthropathy. No traumatic malalignment. Skull base and vertebrae: No regional fracture. Soft  tissues and spinal canal: No traumatic soft tissue finding. Disc levels: Chronic degenerative spondylosis at C4-5, C5-6 and C6-7 with mild foraminal encroachment. Chronic facet arthropathy on the left at C3-4, C4-5, C5-6, C6-7 and C7-T1 with mild foraminal encroachment due to facet osteophytes. No central canal stenosis. Upper chest: Negative Other: None IMPRESSION: No acute or traumatic finding. Chronic degenerative spondylosis and facet arthropathy as outlined above. Electronically Signed   By: Nelson Chimes M.D.   On: 04/08/2022 13:31   DG Hip Unilat With Pelvis 2-3 Views Right  Result Date: 04/08/2022 CLINICAL DATA:  Right hip pain after fall. EXAM: DG HIP (WITH OR WITHOUT PELVIS) 2-3V RIGHT COMPARISON:  None Available. FINDINGS: Severely displaced and comminuted fracture is seen involving their intertrochanteric region of the proximal right femur. IMPRESSION: Severely displaced and comminuted intertrochanteric fracture of proximal right femur. Electronically Signed   By: Marijo Conception M.D.   On: 04/08/2022 13:15    Procedures Procedures    Medications  Ordered in ED Medications  fentaNYL (SUBLIMAZE) injection 25 mcg (has no administration in time range)  ondansetron (ZOFRAN) injection 4 mg (4 mg Intravenous Given 04/08/22 1253)    ED Course/ Medical Decision Making/ A&P This patient with a Hx of multiple medical problems including arthritis, hypertension presents to the ED for concern of pain in multiple areas following a fall with nausea vomiting as well., this involves an extensive number of treatment options, and is a complaint that carries with it a high risk of complications and morbidity.    The differential diagnosis includes hip fracture, intracranial injury, cervical spine fracture, skull fracture   Social Determinants of Health:  Stage, mild dementia  Additional history obtained:  Additional history and/or information obtained from EMS and son at bedside, notable for details of the fall, baseline history   After the initial evaluation, orders, including: CT head, neck, x-ray, labs were initiated.   Patient placed on Cardiac and Pulse-Oximetry Monitors. The patient was maintained on a cardiac monitor.  The cardiac monitored showed an rhythm of 60 sinus normal The patient was also maintained on pulse oximetry. The readings were typically 100% room air normal   On repeat evaluation of the patient improved  Lab Tests:  I personally interpreted labs.  The pertinent results include: Mild leukocytosis  Imaging Studies ordered:  I independently visualized and interpreted imaging which showed hip fracture on x-ray, no intracranial abnormality on CT, no cervical spine fracture I agree with the radiologist interpretation  Consultations Obtained:  I requested consultation with the orthopedics,  and discussed lab and imaging findings as well as pertinent plan - they recommend: Surgical repair.  Patient be admitted to our internal medicine/hospitalist team  Dispostion / Final MDM:  After consideration of the diagnostic  results and the patient's response to treatment, this adult female presents after sustaining a mechanical fall on the first day of checking into a new nursing facility.  Patient is awake and alert, distally neurovascular intact, did have nausea vomiting on arrival, and with consideration of head injury/trauma, imaging was performed of the head, neck as well as the hip, and additional labs.  Patient received IV narcotics, antiemetics, improved, though she was found to have a right hip fracture requiring admission for surgical repair.  Final Clinical Impression(s) / ED Diagnoses Final diagnoses:  Fall, initial encounter  Closed displaced intertrochanteric fracture of right femur, initial encounter [facility])     Carmin Muskrat, MD 04/08/22 1444

## 2022-04-08 NOTE — Consult Note (Addendum)
ORTHOPAEDIC CONSULTATION  REQUESTING PHYSICIAN: Jonnie Finner, DO  Chief Complaint: right hip pain  HPI: Gabrielle Warren is a 88 y.o. female with history of dementia, HLD, HTN, and anxiety who presented to the [facility] with new onset right hip pain after a fall. She tripped and fell while getting out of her daughter's car this morning. She was unable to weight bear. She states right hip pain is severe, worse with movement, and better with rest. She uses a rollator at baseline when walking.   Past Medical History:  Diagnosis Date   Anemia 12/10/2011   ANXIETY 09/23/2007   Arthritis    Atrial tachycardia    Breast cancer ([place])    HYPERLIPIDEMIA 07/03/2009   HYPERTENSION 07/25/2006   Macular degeneration    MVP (mitral valve prolapse)    Osteoarthritis    Osteoporosis    Prediabetes 06/15/2015   Past Surgical History:  Procedure Laterality Date   BREAST LUMPECTOMY Right 08/08/2011   BREAST SURGERY  08/08/11   lumpectomy   CARPAL TUNNEL RELEASE  09/2004   CATARACT EXTRACTION  04/2000, 03/2002   Social History   Socioeconomic History   Marital status: Widowed    Spouse name: Not on file   Number of children: 3   Years of education: Not on file   Highest education level: Not on file  Occupational History   Occupation: Housekeeper  Tobacco Use   Smoking status: Former    Types: Cigarettes    Quit date: 07/08/1994    Years since quitting: 27.7   Smokeless tobacco: Never  Vaping Use   Vaping Use: Never used  Substance and Sexual Activity   Alcohol use: Yes    Comment: 1/2 can of beer with supper occ.   Drug use: No   Sexual activity: Not Currently    Partners: Male  Other Topics Concern   Not on file  Social History Narrative   One story home   No caffeine   Right handed   Social Determinants of Health   Financial Resource Strain: Low Risk  (08/29/2021)   Overall Financial Resource Strain (CARDIA)    Difficulty of Paying Living Expenses: Not hard at all   Food Insecurity: No Food Insecurity (02/05/2022)   Hunger Vital Sign    Worried About Running Out of Food in the Last Year: Never true    Ran Out of Food in the Last Year: Never true  Transportation Needs: No Transportation Needs (02/05/2022)   PRAPARE - Hydrologist (Medical): No    Lack of Transportation (Non-Medical): No  Physical Activity: Sufficiently Active (09/23/2017)   Exercise Vital Sign    Days of Exercise per Week: 5 days    Minutes of Exercise per Session: 40 min  Stress: No Stress Concern Present (09/23/2017)   [place]    Feeling of Stress : Not at all  Social Connections: Moderately Integrated (09/23/2017)   Social Connection and Isolation Panel [NHANES]    Frequency of Communication with Friends and Family: More than three times a week    Frequency of Social Gatherings with Friends and Family: More than three times a week    Attends Religious Services: More than 4 times per year    Active Member of [employer] or Organizations: Yes    Attends Archivist Meetings: More than 4 times per year    Marital Status: Widowed   Family History  Problem Relation Age of Onset   Heart failure Mother    Stroke Father    Hypertension Other    Colon cancer Neg Hx    Allergies  Allergen Reactions   Fosamax [Alendronate Sodium] Other (See Comments)    Reaction: Bleeding gums   Prior to Admission medications   Medication Sig Start Date End Date Taking? Authorizing Provider  aspirin EC 81 MG tablet Take 81 mg by mouth at bedtime.   Yes [provider]  Calcium Carb-Cholecalciferol [phone] MG-UNIT CAPS Take 1 capsule by mouth 2 (two) times daily.   Yes [provider]  Cholecalciferol (VITAMIN D3) 50 MCG (2000 UT) TABS Take by mouth.   Yes [provider]  diazepam (VALIUM) 5 MG tablet TAKE 1/2 TABLET(2.5 MG) BY MOUTH DAILY 03/28/22  Yes Burns, Claudina Lick, MD   diltiazem (CARDIZEM CD) 120 MG 24 hr capsule TAKE 1 CAPSULE(120 MG) BY MOUTH DAILY 09/17/21  Yes Burns, Claudina Lick, MD  losartan (COZAAR) 100 MG tablet TAKE 1 TABLET(100 MG) BY MOUTH DAILY 09/17/21  Yes Burns, Claudina Lick, MD  metoprolol succinate (TOPROL-XL) 25 MG 24 hr tablet TAKE 1 TABLET(25 MG) BY MOUTH TWICE DAILY 10/09/21  Yes Hochrein, Jeneen Rinks, MD  Multiple Vitamins-Minerals (ICAPS AREDS FORMULA PO) Take 2 tablets by mouth.   Yes [provider]  prazosin (MINIPRESS) 2 MG capsule TAKE 2 CAPSULES(4 MG) BY MOUTH TWICE DAILY 03/20/22  Yes Burns, Claudina Lick, MD  QUEtiapine (SEROQUEL) 50 MG tablet TAKE 1 TABLET(50 MG) BY MOUTH AT BEDTIME 03/05/22  Yes Burns, Claudina Lick, MD  simvastatin (ZOCOR) 20 MG tablet TAKE 1 TABLET BY MOUTH EVERY DAY 03/28/22  Yes Burns, Claudina Lick, MD  STOOL SOFTENER 100 MG capsule TAKE 2 CAPSULES(200 MG) BY MOUTH DAILY FOR CONSTIPATION 08/16/19  Yes Burns, Claudina Lick, MD  amoxicillin (AMOXIL) 500 MG tablet TAKE 4 TABLETS BY MOUTH 1 HOUR PRIOR TO APPOINTMENT 09/29/19   [provider]  bisacodyl (BISACODYL) 5 MG EC tablet Take 1 tablet (5 mg total) by mouth daily as needed for moderate constipation. 04/19/19   Binnie Rail, MD  Multiple Vitamin (MULTIVITAMIN WITH MINERALS) TABS tablet Take 1 tablet by mouth daily.    [provider]  psyllium (METAMUCIL) 58.6 % powder Take 1 packet by mouth 3 (three) times daily as needed.    [provider]   CT HEAD WO CONTRAST  Result Date: 04/08/2022 CLINICAL DATA:  Witnessed fall EXAM: CT HEAD WITHOUT CONTRAST TECHNIQUE: Contiguous axial images were obtained from the base of the skull through the vertex without intravenous contrast. RADIATION DOSE REDUCTION: This exam was performed according to the departmental dose-optimization program which includes automated exposure control, adjustment of the mA and/or kV according to patient size and/or use of iterative reconstruction technique. COMPARISON:  02/10/2020 FINDINGS: Brain: Age  related volume loss. Chronic small-vessel ischemic changes of the cerebral hemispheric white matter. No sign of acute infarction, mass lesion, hemorrhage, hydrocephalus or extra-axial collection. Vascular: There is atherosclerotic calcification of the major vessels at the base of the brain. Skull: Negative Sinuses/Orbits: Clear/normal Other: None IMPRESSION: No acute or traumatic finding. Age related volume loss. Chronic small-vessel ischemic changes of the cerebral hemispheric white matter. Electronically Signed   By: Nelson Chimes M.D.   On: 04/08/2022 13:32   CT CERVICAL SPINE WO CONTRAST  Result Date: 04/08/2022 CLINICAL DATA:  Witnessed fall EXAM: CT CERVICAL SPINE WITHOUT CONTRAST TECHNIQUE: Multidetector CT imaging of the cervical spine was performed without intravenous contrast. Multiplanar  CT image reconstructions were also generated. RADIATION DOSE REDUCTION: This exam was performed according to the departmental dose-optimization program which includes automated exposure control, adjustment of the mA and/or kV according to patient size and/or use of iterative reconstruction technique. COMPARISON:  None Available. FINDINGS: Alignment: Chronic straightening and kyphotic curvature in the cervical region probably due to facet arthropathy. No traumatic malalignment. Skull base and vertebrae: No regional fracture. Soft tissues and spinal canal: No traumatic soft tissue finding. Disc levels: Chronic degenerative spondylosis at C4-5, C5-6 and C6-7 with mild foraminal encroachment. Chronic facet arthropathy on the left at C3-4, C4-5, C5-6, C6-7 and C7-T1 with mild foraminal encroachment due to facet osteophytes. No central canal stenosis. Upper chest: Negative Other: None IMPRESSION: No acute or traumatic finding. Chronic degenerative spondylosis and facet arthropathy as outlined above. Electronically Signed   By: Nelson Chimes M.D.   On: 04/08/2022 13:31   DG Hip Unilat With Pelvis 2-3 Views Right  Result  Date: 04/08/2022 CLINICAL DATA:  Right hip pain after fall. EXAM: DG HIP (WITH OR WITHOUT PELVIS) 2-3V RIGHT COMPARISON:  None Available. FINDINGS: Severely displaced and comminuted fracture is seen involving their intertrochanteric region of the proximal right femur. IMPRESSION: Severely displaced and comminuted intertrochanteric fracture of proximal right femur. Electronically Signed   By: Marijo Conception M.D.   On: 04/08/2022 13:15   Family History Reviewed and non-contributory, no pertinent history of problems with bleeding or anesthesia      Review of Systems 14 system ROS conducted and negative except for that noted in HPI   OBJECTIVE  Vitals:Patient Vitals for the past 8 hrs:  BP Temp Temp src Pulse Resp SpO2 Height Weight  04/08/22 1646 130/70 97.8 F (36.6 C) Oral 72 16 100 % 4' 11" (1.499 m) 43.1 kg  04/08/22 1600 (!) 159/50 98 F (36.7 C) -- (!) 54 13 98 % -- --  04/08/22 1530 (!) 112/94 -- -- (!) 52 19 100 % -- --  04/08/22 1516 -- -- -- 60 18 100 % -- --  04/08/22 1515 117/66 -- -- (!) 55 (!) 22 94 % -- --  04/08/22 1245 (!) 164/68 -- -- 61 14 97 % -- --  04/08/22 1232 (!) 179/65 97.6 F (36.4 C) Oral 65 13 98 % -- --   General: Alert, no acute distress Cardiovascular: Warm extremities noted Respiratory: No cyanosis, no use of accessory musculature GI: No organomegaly, abdomen is soft and non-tender Skin: No lesions in the area of chief complaint other than those listed below in MSK exam.  Neurologic: Sensation intact distally save for the below mentioned MSK exam Psychiatric: Calm/cooperative Lymphatic: No swelling obvious and reported other than the area involved in the exam below  MSK: Right hip has positive logroll, pain to palpation over the proximal hip, she has a small abrasion over the knee.  EHL is intact.      Test Results Imaging Severely displaced and comminuted intertrochanteric fracture of proximal right femur.  Labs cbc Recent Labs     04/08/22 1250  WBC 12.1*  HGB 9.9*  HCT 30.0*  PLT 175    Labs inflam No results for input(s): "CRP" in the last 72 hours.  Invalid input(s): "ESR"  Labs coag Recent Labs    04/08/22 1250  INR 1.0    Recent Labs    04/08/22 1250  NA 134*  K 4.3  CL 103  CO2 25  GLUCOSE 160*  BUN 15  CREATININE 0.91  CALCIUM 8.9  ASSESSMENT AND PLAN: 88 y.o. female with the following: closed right hip fracture   Discussed the nature of the injury as well as the care with the patient as well as the family.  Discussed options and non-operative versus operative measures. Nonoperative measures are not well tolerated as patient's on bedrest for extended periods of time tend to develop secondary issues such as pneumonia, urinary tract infections, bedsores and delirium.  Based on this our recommendation is for operative measures.  Understanding this the patient/family elected to proceed with operative measures.  The risks and benefits of  surgical intervention including infection, bleeding, nerve injury, periprosthetic fracture, the need for revision surgery, leg length discrepancy, gait change, blood clots, cardiopulmonary complications, morbidity, mortality, among others, and they were willing to proceed.    Plan to move forward with right hip intramedullary nail.    04/08/2022 5:44 PM

## 2022-04-08 NOTE — Progress Notes (Signed)
1715 LR added in Short Stay, mouth care given unable to do the Peridex due to hx. Of dementia.

## 2022-04-08 NOTE — ED Triage Notes (Signed)
88 year old female brought in by EMS from [facility] following a witnessed mechanical fall. She did not have a head injury and is not on any blood thinners. Per EMS she c/o right hip pain and her R leg is shortening, pelvic binder applied and C-collar. They gave 128mg of Fentanyl and '4mg'$  of Zofran after patient had nausea and vomiting. She has 20g in L forearm and HX of dementia.  200/72 66 96% CBG: 164

## 2022-04-08 NOTE — ED Notes (Signed)
Pt to short stay

## 2022-04-08 NOTE — Progress Notes (Signed)
Fentanyl not given at 1500 entered in error while entering the LR.  Unable to remove.

## 2022-04-08 NOTE — Progress Notes (Signed)
88 year old woman with mechanical fall today, with acute right subtrochanteric femur fracture.  Plan for intramedullary nail fixation probably later today, this evening.  Keep n.p.o., full consult to follow.  Marchia Bond, MD

## 2022-04-08 NOTE — Anesthesia Postprocedure Evaluation (Signed)
Anesthesia Post Note  Patient: Dawnette Mione Lacosse  Procedure(s) Performed: INTRAMEDULLARY (IM) NAIL FEMORAL (Right: Hip)     Patient location during evaluation: PACU Anesthesia Type: General Level of consciousness: awake and alert Pain management: pain level controlled Vital Signs Assessment: post-procedure vital signs reviewed and stable Respiratory status: spontaneous breathing, nonlabored ventilation, respiratory function stable and patient connected to nasal cannula oxygen Cardiovascular status: blood pressure returned to baseline and stable Postop Assessment: no apparent nausea or vomiting Anesthetic complications: no   No notable events documented.  Last Vitals:  Vitals:   04/08/22 2045 04/08/22 2110  BP:  (!) 158/80  Pulse: 78 65  Resp: (!) 23 16  Temp:  37.1 C  SpO2: 93% 100%    Last Pain:  Vitals:   04/08/22 1646  TempSrc: Oral  PainSc:                  Marlen Mollica

## 2022-04-08 NOTE — Op Note (Signed)
DATE OF SURGERY:  04/08/2022  TIME: 7:06 PM  PATIENT NAME:  Gabrielle Warren  AGE: 88 y.o.  PRE-OPERATIVE DIAGNOSIS: Right subtrochanteric femur fracture  POST-OPERATIVE DIAGNOSIS:  SAME  PROCEDURE: Intramedullary nail fixation right subtrochanteric femur fracture  SURGEON:  Johnny Bridge  ASSISTANT:  Merlene Pulling, PA-C, present and scrubbed throughout the case, critical for assistance with exposure, retraction, instrumentation, and closure.  OPERATIVE IMPLANTS:  Implant Name Type Inv. Item Serial No. Manufacturer Lot No. LRB No. Used Action  NAIL CAN TFNA 9MM 130DEG HIP - [id number] Nail NAIL CAN TFNA 9MM 130DEG HIP  [employer] [id number] Right 1 Implanted  BLADE HELICAL TFNA 90 - [id number] Anchor BLADE HELICAL TFNA 90  [employer]  Right 1 Implanted  SCREW LOCK IM 5X36 - [id number] Screw SCREW LOCK IM 5X36  [employer]  Right 1 Implanted     UNIQUE ASPECTS OF THE CASE: She had for fairly poor bone quality overall.  I was able to reduce it anatomically, although during the opening of the proximal femur the reamer did displace the fracture slightly, but medial calcar remained in good position.  ESTIMATED BLOOD LOSS: 100 mL  PREOPERATIVE INDICATIONS:  Gabrielle Warren is a 88 y.o. year old who fell and suffered a hip fracture. She was brought into the ER and then admitted and optimized and then elected for surgical intervention.    The risks benefits and alternatives were discussed with the patient including but not limited to the risks of nonoperative treatment, versus surgical intervention including infection, bleeding, nerve injury, malunion, nonunion, hardware prominence, hardware failure, need for hardware removal, blood clots, cardiopulmonary complications, morbidity, mortality, among others, and they were willing to proceed.    OPERATIVE PROCEDURE:  The patient was brought to the operating room and placed in the supine position. Anesthesia was  administered. She was placed on the fracture table.  Closed reduction was performed under C-arm guidance.  Time out was then performed after sterile prep and drape. She received preoperative antibiotics.  Incision was made proximal to the greater trochanter. A guidewire was placed in the appropriate position. Confirmation was made on AP and lateral views.  The above-named nail was opened. I opened the proximal femur with a reamer.  I reamed to a size 10 fairly easily.  I measured the length of the nail.  I then placed the nail by hand down.   Once the nail was completely seated, I placed a guidepin into the femoral head into the center center position. I measured the length, and then reamed the lateral cortex and up into the head. I then placed the cephalomedullary screw. Anatomic fixation achieved.   I then secured the proximal interlocking bolt, and locked the nail distally using the perfect circles technique.  I took final C-arm pictures AP and lateral.   Anatomic reconstruction was achieved, and the wounds were irrigated copiously and closed with Vicryl followed by Steri-Strips and sterile gauze for the skin. The patient was awakened and returned to PACU in stable and satisfactory condition. There were no complications and the patient tolerated the procedure well.  She will be weightbearing as tolerated, and will be on chemoprophylaxis for a period of four weeks after discharge.   Marchia Bond, M.D.

## 2022-04-09 ENCOUNTER — Telehealth: Payer: Self-pay | Admitting: Internal Medicine

## 2022-04-09 ENCOUNTER — Encounter (HOSPITAL_COMMUNITY): Payer: Self-pay | Admitting: Orthopedic Surgery

## 2022-04-09 DIAGNOSIS — F419 Anxiety disorder, unspecified: Secondary | ICD-10-CM | POA: Diagnosis not present

## 2022-04-09 DIAGNOSIS — D62 Acute posthemorrhagic anemia: Secondary | ICD-10-CM

## 2022-04-09 DIAGNOSIS — G309 Alzheimer's disease, unspecified: Secondary | ICD-10-CM

## 2022-04-09 DIAGNOSIS — F0284 Dementia in other diseases classified elsewhere, unspecified severity, with anxiety: Secondary | ICD-10-CM

## 2022-04-09 DIAGNOSIS — S72001A Fracture of unspecified part of neck of right femur, initial encounter for closed fracture: Secondary | ICD-10-CM | POA: Diagnosis not present

## 2022-04-09 DIAGNOSIS — E43 Unspecified severe protein-calorie malnutrition: Secondary | ICD-10-CM | POA: Insufficient documentation

## 2022-04-09 DIAGNOSIS — E7849 Other hyperlipidemia: Secondary | ICD-10-CM

## 2022-04-09 DIAGNOSIS — I10 Essential (primary) hypertension: Secondary | ICD-10-CM

## 2022-04-09 LAB — BASIC METABOLIC PANEL
Anion gap: 5 (ref 5–15)
BUN: 16 mg/dL (ref 8–23)
CO2: 24 mmol/L (ref 22–32)
Calcium: 8.4 mg/dL — ABNORMAL LOW (ref 8.9–10.3)
Chloride: 106 mmol/L (ref 98–111)
Creatinine, Ser: 0.92 mg/dL (ref 0.44–1.00)
GFR, Estimated: 58 mL/min — ABNORMAL LOW (ref >60)
Glucose, Bld: 129 mg/dL — ABNORMAL HIGH (ref 70–99)
Potassium: 4.4 mmol/L (ref 3.5–5.1)
Sodium: 135 mmol/L (ref 135–145)

## 2022-04-09 LAB — CBC
HCT: 22.2 % — ABNORMAL LOW (ref 36.0–46.0)
Hemoglobin: 7.4 g/dL — ABNORMAL LOW (ref 12.0–15.0)
MCH: 31.2 pg (ref 26.0–34.0)
MCHC: 33.3 g/dL (ref 30.0–36.0)
MCV: 93.7 fL (ref 80.0–100.0)
Platelets: 144 10*3/uL — ABNORMAL LOW (ref 150–400)
RBC: 2.37 MIL/uL — ABNORMAL LOW (ref 3.87–5.11)
RDW: 13.2 % (ref 11.5–15.5)
WBC: 13 10*3/uL — ABNORMAL HIGH (ref 4.0–10.5)
nRBC: 0 % (ref 0.0–0.2)

## 2022-04-09 LAB — PREPARE RBC (CROSSMATCH)

## 2022-04-09 LAB — ABO/RH: ABO/RH(D): O POS

## 2022-04-09 MED ORDER — QUETIAPINE FUMARATE 50 MG PO TABS
50.0000 mg | ORAL_TABLET | Freq: Every day | ORAL | Status: DC
  Administered 2022-04-09 – 2022-04-10 (×2): 50 mg via ORAL
  Filled 2022-04-09 (×2): qty 1

## 2022-04-09 MED ORDER — ENOXAPARIN SODIUM 300 MG/3ML IJ SOLN
20.0000 mg | INTRAMUSCULAR | Status: DC
  Filled 2022-04-09: qty 0.2

## 2022-04-09 MED ORDER — ASPIRIN 81 MG PO TBEC
81.0000 mg | DELAYED_RELEASE_TABLET | Freq: Every day | ORAL | Status: DC
  Administered 2022-04-09 – 2022-04-10 (×2): 81 mg via ORAL
  Filled 2022-04-09 (×2): qty 1

## 2022-04-09 MED ORDER — ADULT MULTIVITAMIN W/MINERALS CH
1.0000 | ORAL_TABLET | Freq: Every day | ORAL | Status: DC
  Administered 2022-04-10 – 2022-04-11 (×2): 1 via ORAL
  Filled 2022-04-09 (×2): qty 1

## 2022-04-09 MED ORDER — ENSURE ENLIVE PO LIQD
237.0000 mL | Freq: Two times a day (BID) | ORAL | Status: DC
  Administered 2022-04-09 – 2022-04-11 (×5): 237 mL via ORAL

## 2022-04-09 MED ORDER — LOSARTAN POTASSIUM 50 MG PO TABS
100.0000 mg | ORAL_TABLET | Freq: Every day | ORAL | Status: DC
  Administered 2022-04-09 – 2022-04-11 (×3): 100 mg via ORAL
  Filled 2022-04-09 (×3): qty 2

## 2022-04-09 MED ORDER — METOPROLOL SUCCINATE ER 25 MG PO TB24
25.0000 mg | ORAL_TABLET | Freq: Every day | ORAL | Status: DC
  Administered 2022-04-09 – 2022-04-11 (×3): 25 mg via ORAL
  Filled 2022-04-09 (×3): qty 1

## 2022-04-09 MED ORDER — DILTIAZEM HCL ER COATED BEADS 120 MG PO CP24
120.0000 mg | ORAL_CAPSULE | Freq: Every day | ORAL | Status: DC
  Administered 2022-04-09 – 2022-04-11 (×3): 120 mg via ORAL
  Filled 2022-04-09 (×3): qty 1

## 2022-04-09 MED ORDER — SODIUM CHLORIDE 0.9% IV SOLUTION: Freq: Once | INTRAVENOUS | Status: AC

## 2022-04-09 MED ORDER — ATORVASTATIN CALCIUM 10 MG PO TABS
10.0000 mg | ORAL_TABLET | Freq: Every day | ORAL | Status: DC
  Administered 2022-04-09 – 2022-04-11 (×3): 10 mg via ORAL
  Filled 2022-04-09 (×3): qty 1

## 2022-04-09 NOTE — Assessment & Plan Note (Signed)
As evidenced by severe loss of subcutaneous muscel mass and fat - Consult dietitian - Dietary rpotein supplements

## 2022-04-09 NOTE — Assessment & Plan Note (Addendum)
BP still very elevated - Continue losartan, diltaizem, metoprolol - Resume prazosin

## 2022-04-09 NOTE — Progress Notes (Signed)
The order for simvastatin(Zocor) was changed to an equivalent dose of atorvastatin(Lipitor) due to the potential drug interaction with diltiazem.  When taken in combination with medications that inhibit its metabolism, simvastatin can accumulate which increases the risk of liver toxicity, myopathy, or rhabdomyolysis.  Simvastatin dose should not exceed '20mg'$ /day in patients taking amlodipine, ranolazine or amiodarone.   Please consider this potential interaction at discharge.  Demiah Gullickson A 04/09/2022 2:28 PM

## 2022-04-09 NOTE — Assessment & Plan Note (Addendum)
-   Hold diazepam - Continue Seroquel

## 2022-04-09 NOTE — Assessment & Plan Note (Addendum)
Hgb baseline 10s, was 9/9 on admission, overnight post-op down to 7.4.  Today symptomatic, dizzy, fatigues. - Transfuse 1 unit PRBC - Trend Hgb

## 2022-04-09 NOTE — Progress Notes (Signed)
Subjective: 1 Day Post-Op s/p Procedure(s): INTRAMEDULLARY (IM) NAIL FEMORAL   Patient laying in bed, easily awoken. Does not complain of pain when asked, able to follow all directions, pleasantly confused.    Objective:  PE: VITALS:   Vitals:   04/08/22 2311 04/09/22 0055 04/09/22 0529 04/09/22 0610  BP: (!) 170/50 (!) 153/74 (!) 142/60 (!) 167/69  Pulse: 89 92 84 100  Resp: '16 18 18 19  '$ Temp: (!) 97.5 F (36.4 C) 97.7 F (36.5 C)  97.7 F (36.5 C)  TempSrc: Oral Oral  Oral  SpO2: 99% 98%  98%  Weight:      Height:       General: laying in bed, somnolent, easily awoken but then quickly falls back asleep MSK: surgical dressings CDI except proximal dressing with moderate drainage. Dorsiflexion and plantarflexion intact. Foot warm and well perfused. Patient endorses sensation to foot and toes.  LABS  Results for orders placed or performed during the hospital encounter of 04/08/22 (from the past 24 hour(s))  Basic metabolic panel     Status: Abnormal   Collection Time: 04/08/22 12:50 PM  Result Value Ref Range   Sodium 134 (L) 135 - 145 mmol/L   Potassium 4.3 3.5 - 5.1 mmol/L   Chloride 103 98 - 111 mmol/L   CO2 25 22 - 32 mmol/L   Glucose, Bld 160 (H) 70 - 99 mg/dL   BUN 15 8 - 23 mg/dL   Creatinine, Ser 0.91 0.44 - 1.00 mg/dL   Calcium 8.9 8.9 - 10.3 mg/dL   GFR, Estimated 59 (L) >60 mL/min   Anion gap 6 5 - 15  CBC with Differential     Status: Abnormal   Collection Time: 04/08/22 12:50 PM  Result Value Ref Range   WBC 12.1 (H) 4.0 - 10.5 K/uL   RBC 3.21 (L) 3.87 - 5.11 MIL/uL   Hemoglobin 9.9 (L) 12.0 - 15.0 g/dL   HCT 30.0 (L) 36.0 - 46.0 %   MCV 93.5 80.0 - 100.0 fL   MCH 30.8 26.0 - 34.0 pg   MCHC 33.0 30.0 - 36.0 g/dL   RDW 13.1 11.5 - 15.5 %   Platelets 175 150 - 400 K/uL   nRBC 0.0 0.0 - 0.2 %   Neutrophils Relative % 75 %   Neutro Abs 9.3 (H) 1.7 - 7.7 K/uL   Lymphocytes Relative 14 %   Lymphs Abs 1.7 0.7 - 4.0 K/uL   Monocytes Relative 8 %    Monocytes Absolute 0.9 0.1 - 1.0 K/uL   Eosinophils Relative 1 %   Eosinophils Absolute 0.1 0.0 - 0.5 K/uL   Basophils Relative 1 %   Basophils Absolute 0.1 0.0 - 0.1 K/uL   Immature Granulocytes 1 %   Abs Immature Granulocytes 0.08 (H) 0.00 - 0.07 K/uL  Protime-INR     Status: None   Collection Time: 04/08/22 12:50 PM  Result Value Ref Range   Prothrombin Time 13.5 11.4 - 15.2 seconds   INR 1.0 0.8 - 1.2  Type and screen Magnet     Status: None   Collection Time: 04/08/22 12:50 PM  Result Value Ref Range   ABO/RH(D) O POS    Antibody Screen NEG    Sample Expiration      04/11/2022,2359 Performed at [facility], [place] [address], [place], [place] [address]   Iron and TIBC     Status: Abnormal   Collection Time: 04/08/22  9:52  PM  Result Value Ref Range   Iron 19 (L) 28 - 170 ug/dL   TIBC 179 (L) 250 - 450 ug/dL   Saturation Ratios 11 10.4 - 31.8 %   UIBC 160 ug/dL  ABO/Rh     Status: None   Collection Time: 04/08/22  9:57 PM  Result Value Ref Range   ABO/RH(D)      O POS Performed at [facility], [place] [address], [place], [place] [address]   CBC     Status: Abnormal   Collection Time: 04/09/22  3:42 AM  Result Value Ref Range   WBC 13.0 (H) 4.0 - 10.5 K/uL   RBC 2.37 (L) 3.87 - 5.11 MIL/uL   Hemoglobin 7.4 (L) 12.0 - 15.0 g/dL   HCT 22.2 (L) 36.0 - 46.0 %   MCV 93.7 80.0 - 100.0 fL   MCH 31.2 26.0 - 34.0 pg   MCHC 33.3 30.0 - 36.0 g/dL   RDW 13.2 11.5 - 15.5 %   Platelets 144 (L) 150 - 400 K/uL   nRBC 0.0 0.0 - 0.2 %  Basic metabolic panel     Status: Abnormal   Collection Time: 04/09/22  3:42 AM  Result Value Ref Range   Sodium 135 135 - 145 mmol/L   Potassium 4.4 3.5 - 5.1 mmol/L   Chloride 106 98 - 111 mmol/L   CO2 24 22 - 32 mmol/L   Glucose, Bld 129 (H) 70 - 99 mg/dL   BUN 16 8 - 23 mg/dL   Creatinine, Ser 0.92 0.44 - 1.00 mg/dL   Calcium 8.4 (L) 8.9 - 10.3 mg/dL   GFR, Estimated 58 (L) >60  mL/min   Anion gap 5 5 - 15    Pelvis Portable  Result Date: 04/08/2022 CLINICAL DATA:  Internal fixation of fracture in the right femur EXAM: PORTABLE PELVIS 1-2 VIEWS COMPARISON:  Study done earlier today FINDINGS: There is interval internal fixation of comminuted fracture of intertrochanteric portion and proximal shaft of right femur. There is 10 mm offset in alignment of lateral cortical margin at the fracture site. IMPRESSION: Interval internal fixation of fracture of proximal right femur with intramedullary rod. Electronically Signed   By: Elmer Picker M.D.   On: 04/08/2022 20:14   DG FEMUR, MIN 2 VIEWS RIGHT  Result Date: 04/08/2022 CLINICAL DATA:  Fluoroscopic assistance for internal fixation of fracture of right femur EXAM: RIGHT FEMUR 2 VIEWS COMPARISON:  Study done earlier today FINDINGS: Fluoroscopic images show internal fixation of comminuted fracture of proximal shaft and intertrochanteric portion of right femur with intramedullary rod. Fluoroscopy time 44 seconds. Radiation dose 5.1 mGy. IMPRESSION: Fluoroscopic assistance was provided for internal fixation of fracture of proximal right femur. Electronically Signed   By: Elmer Picker M.D.   On: 04/08/2022 20:13   DG Hip Port Unilat With Pelvis 1V Right  Result Date: 04/08/2022 CLINICAL DATA:  Internal fixation of fracture of proximal right femur EXAM: DG HIP (WITH OR WITHOUT PELVIS) 1V PORT RIGHT COMPARISON:  Study done earlier today FINDINGS: There is interval internal fixation of comminuted fracture of intertrochanteric region of femur and proximal shaft of the femur. There is residual offset in alignment of lateral cortex. Intramedullary rod is noted in place. IMPRESSION: Interval internal fixation of comminuted fracture of proximal right femur with intramedullary rod. Electronically Signed   By: Elmer Picker M.D.   On: 04/08/2022 20:12   DG C-Arm 1-60 Min-No Report  Result Date: 04/08/2022 Fluoroscopy was  utilized  by the requesting physician.  No radiographic interpretation.   CT HEAD WO CONTRAST  Result Date: 04/08/2022 CLINICAL DATA:  Witnessed fall EXAM: CT HEAD WITHOUT CONTRAST TECHNIQUE: Contiguous axial images were obtained from the base of the skull through the vertex without intravenous contrast. RADIATION DOSE REDUCTION: This exam was performed according to the departmental dose-optimization program which includes automated exposure control, adjustment of the mA and/or kV according to patient size and/or use of iterative reconstruction technique. COMPARISON:  02/10/2020 FINDINGS: Brain: Age related volume loss. Chronic small-vessel ischemic changes of the cerebral hemispheric white matter. No sign of acute infarction, mass lesion, hemorrhage, hydrocephalus or extra-axial collection. Vascular: There is atherosclerotic calcification of the major vessels at the base of the brain. Skull: Negative Sinuses/Orbits: Clear/normal Other: None IMPRESSION: No acute or traumatic finding. Age related volume loss. Chronic small-vessel ischemic changes of the cerebral hemispheric white matter. Electronically Signed   By: Nelson Chimes M.D.   On: 04/08/2022 13:32   CT CERVICAL SPINE WO CONTRAST  Result Date: 04/08/2022 CLINICAL DATA:  Witnessed fall EXAM: CT CERVICAL SPINE WITHOUT CONTRAST TECHNIQUE: Multidetector CT imaging of the cervical spine was performed without intravenous contrast. Multiplanar CT image reconstructions were also generated. RADIATION DOSE REDUCTION: This exam was performed according to the departmental dose-optimization program which includes automated exposure control, adjustment of the mA and/or kV according to patient size and/or use of iterative reconstruction technique. COMPARISON:  None Available. FINDINGS: Alignment: Chronic straightening and kyphotic curvature in the cervical region probably due to facet arthropathy. No traumatic malalignment. Skull base and vertebrae: No regional  fracture. Soft tissues and spinal canal: No traumatic soft tissue finding. Disc levels: Chronic degenerative spondylosis at C4-5, C5-6 and C6-7 with mild foraminal encroachment. Chronic facet arthropathy on the left at C3-4, C4-5, C5-6, C6-7 and C7-T1 with mild foraminal encroachment due to facet osteophytes. No central canal stenosis. Upper chest: Negative Other: None IMPRESSION: No acute or traumatic finding. Chronic degenerative spondylosis and facet arthropathy as outlined above. Electronically Signed   By: Nelson Chimes M.D.   On: 04/08/2022 13:31   DG Hip Unilat With Pelvis 2-3 Views Right  Result Date: 04/08/2022 CLINICAL DATA:  Right hip pain after fall. EXAM: DG HIP (WITH OR WITHOUT PELVIS) 2-3V RIGHT COMPARISON:  None Available. FINDINGS: Severely displaced and comminuted fracture is seen involving their intertrochanteric region of the proximal right femur. IMPRESSION: Severely displaced and comminuted intertrochanteric fracture of proximal right femur. Electronically Signed   By: Marijo Conception M.D.   On: 04/08/2022 13:15    Assessment/Plan: Principal Problem:   Closed right hip fracture (HCC) Active Problems:   Hyperlipidemia   Anxiety   Essential hypertension   Anemia   Hyperglycemia   Dementia ([place])   Fall at home, initial encounter   1 Day Post-Op s/p Procedure(s): INTRAMEDULLARY (IM) NAIL FEMORAL  Weightbearing: WBAT RLE Insicional and dressing care: Reinforce dressings as needed, I attempted to change proximal mepilex dressing this morning but patient requested I not. I will attempt to change later on today. VTE prophylaxis: plan for lovenox, however patient's Hbg dropped to 7.4 today, will hold until Hbg stabilizes  Pain control: continue current regimen, tylenol should be first line for pain, limit narcotics as much as possible. Follow - up plan: 2 weeks with Dr. Mardelle Matte Dispo: pending PT eval   Contact information:   Merlene Pulling, PA-C Weekdays 8-5  After hours and  holidays please check [email or web address] for group call information for [facility]  [place]  Bethena Midget 04/09/2022, 7:24 AM

## 2022-04-09 NOTE — Progress Notes (Signed)
  Progress Note   Patient: Gabrielle Warren DXA:[id number] DOB: 1926/05/17 DOA: 04/08/2022     1 DOS: the patient was seen and examined on 04/09/2022 at 10:45AM      Brief hospital course: Gabrielle Warren is a 88 y.o. F with dementia, lives at home, HTN, HLD and anxiety who presented with      10/2: Admitted, taken to OR for hip repair by Dr. Mardelle Matte 10/3: Transfused 1u PRBCs     Assessment and Plan: * Closed right hip fracture ([name]) - Consult Orthopedics, appreciate cares  Protein-calorie malnutrition, severe As evidenced by severe loss of subcutaneous muscel mass and fat - Consult dietitian - Dietary rpotein supplements  Fall at home, initial encounter    Dementia ([place]) - Hold diazepam - Resume Seroquel    Acute postoperative anemia due to expected blood loss Hgb baseline 10s, was 9/9 on admission, overnight post-op down to 7.4.  Today symptomatic, dizzy, fatigues. - Transfuse 1 unit PRBC - Trend Hgb  Essential hypertension BP elevated - Resume losartan, diltaizem, metoprolol - Hold prazosin  Anxiety - Hold home Valium  Hyperlipidemia - Resume simvastatin          Subjective: Patient has generalized malaise, she is fatigued easily and dizzy with working with physical therapy.  She had no fever, no respiratory symptoms.     Physical Exam: BP (!) 186/68 (BP Location: Right Arm)   Pulse 77   Temp 98 F (36.7 C)   Resp 18   Ht '4\' 11"'$  (1.499 m)   Wt 38.9 kg   SpO2 100%   BMI 17.32 kg/m   Thin elderly female, lying in bed, no acute distress, appears weak and tired, pale RRR, no murmurs, no peripheral edema Soreness in the right hip Respiratory rate normal without rales or wheezes Abdomen soft without tenderness palpation or guarding Tension distracted, affect blunted by pain, judgment insight appear impaired by dementia but at baseline, face symmetric, speech fluent, moves upper extremities with normal strength and coordination    Data  Reviewed: Basic metabolic panel unremarkable Hemogram shows mild leukocytosis, hemoglobin down to 7.4  Family Communication: Son at the bedside    Disposition: Status is: Inpatient The patient was admitted with hip fracture  She had an uncomplicated repair yesterday, hemoglobin is drifted down and she is symptomatic today so we will transfuse 1 unit of blood  Progress with physical therapy, hope for discharge to rehab within 2 to 3 days        Author: Edwin Dada, MD 04/09/2022 2:16 PM  For on call review [email or web address].

## 2022-04-09 NOTE — Hospital Course (Addendum)
Gabrielle Warren is a 88 y.o. F with dementia, lives at home, HTN, HLD and anxiety who presented with      10/2: Admitted, taken to OR for hip repair by Dr. Mardelle Matte 10/3: Transfused 1u PRBCs

## 2022-04-09 NOTE — Progress Notes (Signed)
Initial Nutrition Assessment  DOCUMENTATION CODES:   Severe malnutrition in context of chronic illness, Underweight  INTERVENTION:   -Ensure Plus High Protein po BID, each supplement provides 350 kcal and 20 grams of protein.   -Multivitamin with minerals daily  NUTRITION DIAGNOSIS:   Severe Malnutrition related to chronic illness (dementia) as evidenced by moderate fat depletion, severe muscle depletion, percent weight loss.  GOAL:   Patient will meet greater than or equal to 90% of their needs  MONITOR:   PO intake, Supplement acceptance, Labs, Weight trends, I & O's  REASON FOR ASSESSMENT:   Consult Hip fracture protocol  ASSESSMENT:   88 y.o. female with medical history significant of dementia, HLD, HTN, anxiety. Presenting with right hip pain. Admitted for right hip fracture.  10/2: s/p Intramedullary nail fixation right subtrochanteric femur fracture  Patient in room, no family present at bedside. Pt with dementia, alert/oriented x 1. Pt clutching her stomach, states she isn't feeling good. Has water and an Ensure Max (unopened) at bedside. Can't recall what she ate for breakfast, doesn't want lunch. Per documentation, pt refused breakfast.  Unable to tell me how she was eating PTA. Will order Ensure Plus High Protein for later and daily MVI to aid in post-op healing.   Per weight records, pt has lost 10 lbs since 9/13 (10% wt loss x <1 month, significant for time frame).   Medications: Ferrous sulfate, Senokot  Labs reviewed: Low iron   NUTRITION - FOCUSED PHYSICAL EXAM:  Flowsheet Row Most Recent Value  Orbital Region No depletion  Upper Arm Region Severe depletion  Thoracic and Lumbar Region Unable to assess  Buccal Region Moderate depletion  Temple Region Severe depletion  Clavicle Bone Region Severe depletion  Clavicle and Acromion Bone Region Severe depletion  Scapular Bone Region Severe depletion  Dorsal Hand Moderate depletion  Patellar Region  Moderate depletion  Anterior Thigh Region Moderate depletion  Posterior Calf Region Moderate depletion  Edema (RD Assessment) None  Hair Reviewed  [thin]  Eyes Reviewed  Mouth Reviewed  Skin Reviewed  Nails Reviewed       Diet Order:   Diet Order             Diet regular Room service appropriate? Yes; Fluid consistency: Thin  Diet effective now                   EDUCATION NEEDS:   Not appropriate for education at this time  Skin:  Skin Assessment: Skin Integrity Issues: Skin Integrity Issues:: Incisions Incisions: 10/2 right hip  Last BM:  10/2  Height:   Ht Readings from Last 1 Encounters:  04/08/22 '4\' 11"'$  (1.499 m)    Weight:   Wt Readings from Last 1 Encounters:  04/08/22 38.9 kg    BMI:  Body mass index is 17.32 kg/m.  Estimated Nutritional Needs:   Kcal:  1250-1450  Protein:  60-75g  Fluid:  1.4L/day   Clayton Bibles, MS, RD, LDN Inpatient Clinical Dietitian Contact information available via Amion

## 2022-04-09 NOTE — Assessment & Plan Note (Signed)
-   Resume simvastatin

## 2022-04-09 NOTE — Assessment & Plan Note (Signed)
-   Hold home Valium

## 2022-04-09 NOTE — Telephone Encounter (Signed)
Sunday Spillers with [facility] calls today in regards to PT's medications. They were wanting to clarify that PT was needing both the calcium carb-cholecalciferol [phone] MG RX and the Cholecalciferol (VITAMIN D3) 50 MCG (2000 UT) RX?   They have gotten all of  PT's medications in currently, their pharmacy was concerned about PT having both of these prescriptions. They can keep both on but prefer they be bubbled medications. If one was to be dropped they would like to drop the carb-cholecalciferol [phone] MG RX.   If we do drop this one they will need another order put in for the PT's prescriptions.  (There was a lot of the clinical lingo I was not able to follow so I made sure to get Sunday Spillers and the sites information so that you all could speak with them as well.)  CB for Sunday Spillers: [phone] Sunday Spillers will be gone by 3:00 today and out for the next two days after that) CB for the site: [phone] FAX for the order if needed: [phone]

## 2022-04-09 NOTE — Evaluation (Addendum)
Physical Therapy Evaluation Patient Details Name: Gabrielle Warren MRN: [id number] DOB: [birth year] Today's Date: 04/09/2022  History of Present Illness  88 yo female admitted with R subtrochanteric femur fx s/p IM nail 10/2. Hx of dementia, anxiety  Clinical Impression  On eval, pt was Min A +2 safety/equipment for mobility. She was able to stand and pivot over to the bsc before taking a few steps over to the recliner with a RW. Pt presents with general weakness, decreased activity tolerance, and impaired gait and balance. She c/o dizziness and nausea after mobilizing. No family present during session. PT recommendation is for ST SNF rehab. Will plan to follow pt during this hospital stay.       Recommendations for follow up therapy are one component of a multi-disciplinary discharge planning process, led by the attending physician.  Recommendations may be updated based on patient status, additional functional criteria and insurance authorization.  Follow Up Recommendations Skilled nursing-short term rehab (<3 hours/day) Can patient physically be transported by private vehicle: Yes    Assistance Recommended at Discharge Frequent or constant Supervision/Assistance  Patient can return home with the following  Assist for transportation;Assistance with cooking/housework;Help with stairs or ramp for entrance;A lot of help with walking and/or transfers;A lot of help with bathing/dressing/bathroom    Equipment Recommendations Rolling walker (2 wheels) (youth height)  Recommendations for Other Services       Functional Status Assessment Patient has had a recent decline in their functional status and demonstrates the ability to make significant improvements in function in a reasonable and predictable amount of time.     Precautions / Restrictions Precautions Precautions: Fall Restrictions Weight Bearing Restrictions: No RLE Weight Bearing: Weight bearing as tolerated      Mobility   Bed Mobility Overal bed mobility: Needs Assistance Bed Mobility: Supine to Sit     Supine to sit: Min assist, +2 for safety/equipment, HOB elevated     General bed mobility comments: Assist for trunk and R LE. Assist to scoot to EOB. Increased time. Cues required.    Transfers Overall transfer level: Needs assistance Equipment used: Rolling walker (2 wheels) Transfers: Sit to/from Stand, Bed to chair/wheelchair/BSC Sit to Stand: Min assist, +2 safety/equipment   Step pivot transfers: Min assist, +2 safety/equipment       General transfer comment: Assist to rise, steady, control descent, manage RW. Cues for safety, technique.    Ambulation/Gait Ambulation/Gait assistance: Min assist, +2 safety/equipment Gait Distance (Feet): 5 Feet Assistive device: Rolling walker (2 wheels) Gait Pattern/deviations: Step-to pattern       General Gait Details: Gait is mildly antalgic. Assist to stabilize pt and manage with RW. Cues for safety, technique. Pt c/o dizziness and nausea once in recliner. BP WNL.  Stairs            Wheelchair Mobility    Modified Rankin (Stroke Patients Only)       Balance Overall balance assessment: Needs assistance, History of Falls         Standing balance support: Bilateral upper extremity supported, Reliant on assistive device for balance, During functional activity Standing balance-Leahy Scale: Poor                               Pertinent Vitals/Pain Pain Assessment Pain Assessment: Faces Faces Pain Scale: Hurts even more Pain Location: R thigh with activity Pain Descriptors / Indicators: Discomfort, Guarding, Sore, Grimacing Pain Intervention(s): Limited activity within patient's tolerance,  Monitored during session, Repositioned    Home Living Family/patient expects to be discharged to:: [place]: Children                 Additional Comments: pt is a poor historian.    Prior  Function                       Hand Dominance        Extremity/Trunk Assessment   Upper Extremity Assessment Upper Extremity Assessment: Generalized weakness    Lower Extremity Assessment Lower Extremity Assessment: Generalized weakness    Cervical / Trunk Assessment Cervical / Trunk Assessment: Kyphotic  Communication   Communication: HOH  Cognition Arousal/Alertness: Awake/alert Behavior During Therapy: WFL for tasks assessed/performed Overall Cognitive Status: History of cognitive impairments - at baseline                                 General Comments: A&O x 1. Pleasant. Follows commands        General Comments      Exercises     Assessment/Plan    PT Assessment Patient needs continued PT services  PT Problem List Decreased strength;Decreased mobility;Decreased range of motion;Decreased safety awareness;Decreased cognition;Decreased activity tolerance;Decreased balance;Decreased knowledge of use of DME;Pain       PT Treatment Interventions DME instruction;Gait training;Therapeutic activities;Therapeutic exercise;Patient/family education;Balance training;Functional mobility training    PT Goals (Current goals can be found in the Care Plan section)  Acute Rehab PT Goals Patient Stated Goal: none stated PT Goal Formulation: Patient unable to participate in goal setting Time For Goal Achievement: 04/23/22 Potential to Achieve Goals: Good    Frequency Min 3X/week     Co-evaluation               AM-PAC PT "6 Clicks" Mobility  Outcome Measure Help needed turning from your back to your side while in a flat bed without using bedrails?: A Lot Help needed moving from lying on your back to sitting on the side of a flat bed without using bedrails?: A Lot Help needed moving to and from a bed to a chair (including a wheelchair)?: A Lot Help needed standing up from a chair using your arms (e.g., wheelchair or bedside chair)?: A  Lot Help needed to walk in hospital room?: A Lot Help needed climbing 3-5 steps with a railing? : Total 6 Click Score: 11    End of Session Equipment Utilized During Treatment: Gait belt Activity Tolerance: Patient limited by fatigue;Patient tolerated treatment well;Patient limited by pain Patient left: in chair;with call bell/phone within reach;with chair alarm set   PT Visit Diagnosis: Pain;Muscle weakness (generalized) (M62.81);Difficulty in walking, not elsewhere classified (R26.2) Pain - Right/Left: Right Pain - part of body: Hip    Time: 0973-5329 PT Time Calculation (min) (ACUTE ONLY): 24 min   Charges:   PT Evaluation $PT Eval Moderate Complexity: 1 Mod PT Treatments $Gait Training: 8-22 mins           Doreatha Massed, PT Acute Rehabilitation  Office: [phone] Pager: [phone]

## 2022-04-09 NOTE — Progress Notes (Signed)
Physical Therapy Treatment Patient Details Name: Gabrielle Warren MRN: [id number] DOB: [birth year] Today's Date: 04/09/2022   History of Present Illness 88 yo female admitted with R subtrochanteric femur fx s/p IM nail 10/2. Hx of dementia, anxiety    PT Comments    Pt continues to participate fairly well. Assisted pt onto bsc-she was only able to urinate a small amount. She c/o feeling some pressure in lower abdomen-made RN aware. Ambulation distance was limited by fatigue, pain, and weakness. Assisted pt back to bed. Continue to recommend SNF for rehab.     Recommendations for follow up therapy are one component of a multi-disciplinary discharge planning process, led by the attending physician.  Recommendations may be updated based on patient status, additional functional criteria and insurance authorization.  Follow Up Recommendations  Skilled nursing-short term rehab (<3 hours/day) Can patient physically be transported by private vehicle: Yes   Assistance Recommended at Discharge Frequent or constant Supervision/Assistance  Patient can return home with the following Assist for transportation;Assistance with cooking/housework;Help with stairs or ramp for entrance;A little help with bathing/dressing/bathroom;A little help with walking and/or transfers   Equipment Recommendations  Rolling walker (2 wheels) (youth height)    Recommendations for Other Services       Precautions / Restrictions Precautions Precautions: Fall Restrictions Weight Bearing Restrictions: No RLE Weight Bearing: Weight bearing as tolerated     Mobility  Bed Mobility Overal bed mobility: Needs Assistance Bed Mobility: Sit to Supine     Supine to sit: Min assist, +2 for safety/equipment, HOB elevated Sit to supine: Min assist   General bed mobility comments: Assist for R LE. Increased time. Cues required    Transfers Overall transfer level: Needs assistance Equipment used: Rolling walker (2  wheels) Transfers: Sit to/from Stand, Bed to chair/wheelchair/BSC Sit to Stand: Min assist   Step pivot transfers: Min assist       General transfer comment: Assist to rise, steady, control descent, manage RW. Cues for safety, technique.    Ambulation/Gait Ambulation/Gait assistance: Min assist Gait Distance (Feet): 5 Feet Assistive device: Rolling walker (2 wheels) Gait Pattern/deviations: Step-to pattern, Antalgic, Trunk flexed       General Gait Details: Gait isantalgic. Assist to stabilize pt and manage with RW. Cues for safety, technique. Ambulation distance limited by fatigue, pain, and weakness.   Stairs             Wheelchair Mobility    Modified Rankin (Stroke Patients Only)       Balance Overall balance assessment: Needs assistance, History of Falls         Standing balance support: Bilateral upper extremity supported, Reliant on assistive device for balance, During functional activity Standing balance-Leahy Scale: Poor                              Cognition Arousal/Alertness: Awake/alert Behavior During Therapy: WFL for tasks assessed/performed Overall Cognitive Status: History of cognitive impairments - at baseline                                 General Comments: A&O x 1. Pleasant. Follows commands        Exercises      General Comments        Pertinent Vitals/Pain Pain Assessment Pain Assessment: Faces Faces Pain Scale: Hurts even more Pain Location: R thigh/LE with activity Pain Descriptors / Indicators:  Discomfort, Guarding, Sore, Grimacing Pain Intervention(s): Monitored during session, Limited activity within patient's tolerance, Repositioned    Home Living                          Prior Function            PT Goals (current goals can now be found in the care plan section) Progress towards PT goals: Progressing toward goals    Frequency    Min 3X/week      PT Plan Current  plan remains appropriate    Co-evaluation              AM-PAC PT "6 Clicks" Mobility   Outcome Measure  Help needed turning from your back to your side while in a flat bed without using bedrails?: A Little Help needed moving from lying on your back to sitting on the side of a flat bed without using bedrails?: A Little Help needed moving to and from a bed to a chair (including a wheelchair)?: A Little Help needed standing up from a chair using your arms (e.g., wheelchair or bedside chair)?: A Little Help needed to walk in hospital room?: A Lot Help needed climbing 3-5 steps with a railing? : Total 6 Click Score: 15    End of Session Equipment Utilized During Treatment: Gait belt Activity Tolerance: Patient limited by fatigue;Patient tolerated treatment well;Patient limited by pain Patient left: in bed;with call bell/phone within reach;with bed alarm set   PT Visit Diagnosis: Pain;Muscle weakness (generalized) (M62.81);Difficulty in walking, not elsewhere classified (R26.2) Pain - Right/Left: Right Pain - part of body: Hip     Time: 1430-1440 PT Time Calculation (min) (ACUTE ONLY): 10 min  Charges:  $Gait Training: 8-22 mins                         Doreatha Massed, PT Acute Rehabilitation  Office: [phone] Pager: [phone]

## 2022-04-09 NOTE — Plan of Care (Signed)
  Problem: Pain Management: Goal: Pain level will decrease Outcome: Progressing   

## 2022-04-09 NOTE — Assessment & Plan Note (Signed)
-   Consult Orthopedics, appreciate cares

## 2022-04-10 DIAGNOSIS — F419 Anxiety disorder, unspecified: Secondary | ICD-10-CM | POA: Diagnosis not present

## 2022-04-10 DIAGNOSIS — G309 Alzheimer's disease, unspecified: Secondary | ICD-10-CM | POA: Diagnosis not present

## 2022-04-10 DIAGNOSIS — S72001A Fracture of unspecified part of neck of right femur, initial encounter for closed fracture: Secondary | ICD-10-CM | POA: Diagnosis not present

## 2022-04-10 DIAGNOSIS — D62 Acute posthemorrhagic anemia: Secondary | ICD-10-CM | POA: Diagnosis not present

## 2022-04-10 LAB — CBC
HCT: 28.9 % — ABNORMAL LOW (ref 36.0–46.0)
Hemoglobin: 9.7 g/dL — ABNORMAL LOW (ref 12.0–15.0)
MCH: 30.4 pg (ref 26.0–34.0)
MCHC: 33.6 g/dL (ref 30.0–36.0)
MCV: 90.6 fL (ref 80.0–100.0)
Platelets: 121 10*3/uL — ABNORMAL LOW (ref 150–400)
RBC: 3.19 MIL/uL — ABNORMAL LOW (ref 3.87–5.11)
RDW: 14.2 % (ref 11.5–15.5)
WBC: 12.6 10*3/uL — ABNORMAL HIGH (ref 4.0–10.5)
nRBC: 0 % (ref 0.0–0.2)

## 2022-04-10 LAB — BASIC METABOLIC PANEL
Anion gap: 9 (ref 5–15)
BUN: 15 mg/dL (ref 8–23)
CO2: 25 mmol/L (ref 22–32)
Calcium: 8.4 mg/dL — ABNORMAL LOW (ref 8.9–10.3)
Chloride: 100 mmol/L (ref 98–111)
Creatinine, Ser: 0.86 mg/dL (ref 0.44–1.00)
GFR, Estimated: >60 mL/min (ref >60)
Glucose, Bld: 172 mg/dL — ABNORMAL HIGH (ref 70–99)
Potassium: 3.6 mmol/L (ref 3.5–5.1)
Sodium: 134 mmol/L — ABNORMAL LOW (ref 135–145)

## 2022-04-10 LAB — TYPE AND SCREEN
ABO/RH(D): O POS
Antibody Screen: NEGATIVE
Unit division: 0

## 2022-04-10 LAB — BPAM RBC
Blood Product Expiration Date: 202311052359
ISSUE DATE / TIME: 202310031434
Unit Type and Rh: 5100

## 2022-04-10 MED ORDER — ENOXAPARIN SODIUM 300 MG/3ML IJ SOLN: 20.0000 mg | INTRAMUSCULAR | Status: DC

## 2022-04-10 MED ORDER — HYDROCODONE-ACETAMINOPHEN 5-325 MG PO TABS: 1.0000 | ORAL_TABLET | Freq: Four times a day (QID) | ORAL | Status: DC | PRN

## 2022-04-10 MED ORDER — MORPHINE SULFATE (PF) 2 MG/ML IV SOLN: 0.5000 mg | INTRAVENOUS | Status: DC | PRN

## 2022-04-10 MED ORDER — ENOXAPARIN SODIUM 300 MG/3ML IJ SOLN
20.0000 mg | INTRAMUSCULAR | Status: DC
  Administered 2022-04-11: 20 mg via SUBCUTANEOUS
  Filled 2022-04-10: qty 0.2

## 2022-04-10 MED ORDER — PRAZOSIN HCL 1 MG PO CAPS
4.0000 mg | ORAL_CAPSULE | Freq: Two times a day (BID) | ORAL | Status: DC
  Administered 2022-04-10 – 2022-04-11 (×3): 4 mg via ORAL
  Filled 2022-04-10 (×4): qty 4

## 2022-04-10 MED ORDER — TRAMADOL HCL 50 MG PO TABS
50.0000 mg | ORAL_TABLET | Freq: Four times a day (QID) | ORAL | Status: DC | PRN
  Administered 2022-04-10 – 2022-04-11 (×2): 50 mg via ORAL
  Filled 2022-04-10 (×2): qty 1

## 2022-04-10 NOTE — Plan of Care (Signed)
  Problem: Activity: Goal: Risk for activity intolerance will decrease Outcome: Progressing   Problem: Pain Managment: Goal: General experience of comfort will improve Outcome: Progressing   Problem: Education: Goal: Verbalization of understanding the information provided (i.e., activity precautions, restrictions, etc) will improve Outcome: Progressing

## 2022-04-10 NOTE — Telephone Encounter (Signed)
Called spoke with [place] today.  Message to be given and they will call if anything else is needed.

## 2022-04-10 NOTE — NC FL2 (Signed)
[place] LEVEL OF CARE SCREENING TOOL     IDENTIFICATION  Patient Name: Gabrielle Warren Birthdate: [birth year] Sex: female Admission Date (Current Location): 04/08/2022  [facility] and [place] Number:  Herbalist and Address:  [facility],  [place], [place]      Provider Number: [id number]  Attending Physician Name and Address:  Edwin Dada, *  Relative Name and Phone Number:       Current Level of Care: Hospital Recommended Level of Care: [place] Prior Approval Number:    Date Approved/Denied:   PASRR Number: pending  Discharge Plan: SNF    Current Diagnoses: Patient Active Problem List   Diagnosis Date Noted   Protein-calorie malnutrition, severe 04/09/2022   Closed right hip fracture ([place]) 04/08/2022   Fall at home, initial encounter 04/08/2022   Sleep difficulties 09/16/2021   Aortic atherosclerosis ([place]) 09/11/2020   Hypertensive urgency 04/25/2020   Murmur 09/15/2019   Decreased hearing 01/05/2019   History of breast cancer 04/29/2018   Urinary leakage 04/28/2018   Malignant neoplasm of central portion of right breast in female, estrogen receptor positive ([place]) 09/02/2016   Constipation 12/05/2015   Macular degeneration 06/15/2015   Back pain 06/15/2015   Dementia ([place]) 06/15/2015   Prediabetes 06/15/2015   Breast cancer, right breast ([place]) 09/05/2014   Allergic rhinitis 02/01/2014   Acute postoperative anemia due to expected blood loss 09/22/2013   Invasive ductal carcinoma of breast, stage 1 ([place]) 07/18/2011   CAD (coronary artery disease) 04/26/2011   Hyperlipidemia 07/03/2009   BRUIT 07/21/2008   Anxiety 09/23/2007   OSTEOARTHRITIS 09/23/2007   Osteoporosis 09/23/2007   Essential hypertension 07/25/2006    Orientation RESPIRATION BLADDER Height & Weight     Self, Place  Normal Incontinent, External catheter (currently with purewick) Weight: 85 lb 12.1 oz  (38.9 kg) Height:  '4\' 11"'$  (149.9 cm)  BEHAVIORAL SYMPTOMS/MOOD NEUROLOGICAL BOWEL NUTRITION STATUS      Continent, Incontinent    AMBULATORY STATUS COMMUNICATION OF NEEDS Skin   Limited Assist Verbally Other (Comment) (surgical incision only)                       Personal Care Assistance Level of Assistance  Bathing, Feeding, Dressing Bathing Assistance: Limited assistance Feeding assistance: Limited assistance Dressing Assistance: Limited assistance     Functional Limitations Info             [place]  PT (By licensed PT), OT (By licensed OT)     PT Frequency: 5x/wk OT Frequency: 5x/wk            Contractures Contractures Info: Not present    Additional Factors Info  Code Status, Allergies, Psychotropic Code Status Info: Full Allergies Info: : Fosamax (Alendronate Sodium) Psychotropic Info: see MAR         Current Medications (04/10/2022):  This is the current hospital active medication list Current Facility-Administered Medications  Medication Dose Route Frequency Provider Last Rate Last Admin   acetaminophen (TYLENOL) tablet 325-650 mg  325-650 mg Oral Q6H PRN Merlene Pulling K, PA-C       alum & mag hydroxide-simeth (MAALOX/MYLANTA) 200-200-20 MG/5ML suspension 30 mL  30 mL Oral Q4H PRN Merlene Pulling K, PA-C       aspirin EC tablet 81 mg  81 mg Oral QHS Edwin Dada, MD   81 mg at 04/09/22 2034   atorvastatin (LIPITOR) tablet 10 mg  10 mg Oral Daily Edwin Dada, MD   10 mg at 04/10/22 0935   bisacodyl (DULCOLAX) suppository 10 mg  10 mg Rectal Daily PRN Merlene Pulling K, PA-C       diltiazem (CARDIZEM CD) 24 hr capsule 120 mg  120 mg Oral Daily Danford, Suann Larry, MD   120 mg at 04/10/22 0935   [START ON 04/11/2022] enoxaparin (LOVENOX) 100 mg/mL injection 20 mg  20 mg Subcutaneous Q24H Brown, Blaine K, PA-C       feeding supplement (ENSURE ENLIVE / ENSURE PLUS) liquid 237 mL  237 mL Oral BID BM Danford,  Suann Larry, MD   237 mL at 04/10/22 1311   ferrous sulfate tablet 325 mg  325 mg Oral TID PC Merlene Pulling K, PA-C   325 mg at 04/10/22 1311   HYDROcodone-acetaminophen (NORCO/VICODIN) 5-325 MG per tablet 1 tablet  1 tablet Oral Q6H PRN Merlene Pulling K, PA-C       losartan (COZAAR) tablet 100 mg  100 mg Oral Daily Edwin Dada, MD   100 mg at 04/10/22 0935   magnesium citrate solution 1 Bottle  1 Bottle Oral Once PRN Merlene Pulling K, PA-C       menthol-cetylpyridinium (CEPACOL) lozenge 3 mg  1 lozenge Oral PRN Merlene Pulling K, PA-C       Or   phenol (CHLORASEPTIC) mouth spray 1 spray  1 spray Mouth/Throat PRN Merlene Pulling K, PA-C       metoprolol succinate (TOPROL-XL) 24 hr tablet 25 mg  25 mg Oral Daily Edwin Dada, MD   25 mg at 04/10/22 0935   morphine (PF) 2 MG/ML injection 0.5-1 mg  0.5-1 mg Intravenous Q4H PRN Merlene Pulling K, PA-C       multivitamin with minerals tablet 1 tablet  1 tablet Oral Daily Danford, Suann Larry, MD   1 tablet at 04/10/22 0935   ondansetron (ZOFRAN) injection 4 mg  4 mg Intravenous Q6H PRN Marylyn Ishihara, Tyrone A, DO   4 mg at 04/08/22 1253   ondansetron (ZOFRAN) tablet 4 mg  4 mg Oral Q6H PRN Merlene Pulling K, PA-C       Or   ondansetron [facility]) injection 4 mg  4 mg Intravenous Q6H PRN [place] Shark, Blaine K, PA-C       polyethylene glycol (MIRALAX / GLYCOLAX) packet 17 g  17 g Oral Daily PRN Merlene Pulling K, PA-C   17 g at 04/10/22 1311   prazosin (MINIPRESS) capsule 4 mg  4 mg Oral BID Edwin Dada, MD   4 mg at 04/10/22 0935   QUEtiapine (SEROQUEL) tablet 50 mg  50 mg Oral QHS Edwin Dada, MD   50 mg at 04/09/22 2035   senna (SENOKOT) tablet 8.6 mg  1 tablet Oral BID Merlene Pulling K, PA-C   8.6 mg at 04/10/22 0935   traMADol (ULTRAM) tablet 50 mg  50 mg Oral Q6H PRN Merlene Pulling K, PA-C   50 mg at 04/10/22 1311     Discharge Medications: Please see discharge summary for a list of discharge medications.  Relevant Imaging  Results:  Relevant Lab Results:   Additional Information SS# [id number]  Lennart Pall, [name]

## 2022-04-10 NOTE — Telephone Encounter (Signed)
Okay to stop the separate vitamin D tablet for now and we can always check her vitamin D level at some point since this has not been done in a while.  Medication list updated-took off separate vitamin D pill

## 2022-04-10 NOTE — TOC Initial Note (Signed)
Transition of Care [facility]) - Initial/Assessment Note    Patient Details  Name: Gabrielle Warren MRN: [id number] Date of Birth: 06/06/1926  Transition [facility]) CM/SW Contact:    Lennart Pall, [name] Phone Number: 04/10/2022, 2:50 PM  Clinical Narrative:                 Met with pt and daughter today to review dc planning needs.  Daughter confirms plan for SNF rehab and then to return to [facility] Memory care unit.  Notes she had just completed admission process at [facility] when she fell and suffered fx.  Will begin SNF bed search.  Expected Discharge Plan: Skilled Nursing Facility Barriers to Discharge: Continued Medical Work up, SNF Pending bed offer   Patient Goals and CMS Choice Patient states their goals for this hospitalization and ongoing recovery are:: eventually admit to memory care after snf rehab      Expected Discharge Plan and Services Expected Discharge Plan: [place] In-house Referral: Clinical Social Work   Post Acute Care Choice: [place] Living arrangements for the past 2 months: Single Family Home                 DME Arranged: N/A DME Agency: NA                  Prior Living Arrangements/Services Living arrangements for the past 2 months: Single Family Home Lives with:: Adult Children Patient language and need for interpreter reviewed:: Yes Do you feel safe going back to the place where you live?: Yes      Need for Family Participation in Patient Care: No (Comment) Care giver support system in place?: Yes (comment)   Criminal Activity/Legal Involvement Pertinent to Current Situation/Hospitalization: No - Comment as needed  Activities of Daily Living Home Assistive Devices/Equipment: Eyeglasses, Environmental consultant (specify type) (rolator) ADL Screening (condition at time of admission) Patient's cognitive ability adequate to safely complete daily activities?: No Is the patient deaf or have difficulty hearing?:  No Does the patient have difficulty seeing, even when wearing glasses/contacts?: No Does the patient have difficulty concentrating, remembering, or making decisions?: Yes Patient able to express need for assistance with ADLs?: No Does the patient have difficulty dressing or bathing?: Yes Independently performs ADLs?: No Communication: Needs assistance (withdrawn) Is this a change from baseline?: Pre-admission baseline Dressing (OT): Needs assistance Is this a change from baseline?: Pre-admission baseline Grooming: Needs assistance Is this a change from baseline?: Pre-admission baseline Feeding: Needs assistance Is this a change from baseline?: Pre-admission baseline Bathing: Needs assistance Is this a change from baseline?: Pre-admission baseline Toileting: Needs assistance Is this a change from baseline?: Pre-admission baseline In/Out Bed: Needs assistance Is this a change from baseline?: Pre-admission baseline Walks in Home: Needs assistance Is this a change from baseline?: Pre-admission baseline Does the patient have difficulty walking or climbing stairs?: Yes Weakness of Legs: Right Weakness of Arms/Hands: None  Permission Sought/Granted Permission sought to share information with : Family Supports Permission granted to share information with : Yes, Verbal Permission Granted  Share Information with NAME: [name] Brideau or pt's sons     Permission granted to share info w Relationship: daughter  Permission granted to share info w Contact Information: [phone]  Emotional Assessment Appearance:: Appears stated age   Affect (typically observed): Pleasant Orientation: : Oriented to Self Alcohol / Substance Use: Not Applicable Psych Involvement: No (comment)  Admission diagnosis:  Closed right hip fracture ([place]) [S72.001A] Fall, initial encounter [W19.XXXA]  Closed displaced intertrochanteric fracture of right femur, initial encounter [facility]) [S72.141A] Patient Active  Problem List   Diagnosis Date Noted   Protein-calorie malnutrition, severe 04/09/2022   Closed right hip fracture ([place]) 04/08/2022   Fall at home, initial encounter 04/08/2022   Sleep difficulties 09/16/2021   Aortic atherosclerosis ([place]) 09/11/2020   Hypertensive urgency 04/25/2020   Murmur 09/15/2019   Decreased hearing 01/05/2019   History of breast cancer 04/29/2018   Urinary leakage 04/28/2018   Malignant neoplasm of central portion of right breast in female, estrogen receptor positive ([place]) 09/02/2016   Constipation 12/05/2015   Macular degeneration 06/15/2015   Back pain 06/15/2015   Dementia ([place]) 06/15/2015   Prediabetes 06/15/2015   Breast cancer, right breast ([place]) 09/05/2014   Allergic rhinitis 02/01/2014   Acute postoperative anemia due to expected blood loss 09/22/2013   Invasive ductal carcinoma of breast, stage 1 ([place]) 07/18/2011   CAD (coronary artery disease) 04/26/2011   Hyperlipidemia 07/03/2009   BRUIT 07/21/2008   Anxiety 09/23/2007   OSTEOARTHRITIS 09/23/2007   Osteoporosis 09/23/2007   Essential hypertension 07/25/2006   PCP:  Binnie Rail, MD Pharmacy:   [place], [place] - [address] [place] [address] Phone: [phone] Fax: [phone]     Social Determinants of Health (SDOH) Interventions    Readmission Risk Interventions    04/10/2022    2:48 PM  Readmission Risk Prevention Plan  Transportation Screening Complete  PCP or Specialist Appt within 5-7 Days Complete  Home Care Screening Complete  Medication Review (RN CM) Complete

## 2022-04-10 NOTE — Progress Notes (Signed)
  Progress Note   Patient: Gabrielle Warren JFH:[id number] DOB: 17-Aug-1925 DOA: 04/08/2022     2 DOS: the patient was seen and examined on 04/10/2022  at 10:20AM      Brief hospital course: Mrs. Mogan is a 88 y.o. F with dementia, lives at home, HTN, HLD and anxiety who presented with      10/2: Admitted, taken to OR for hip repair by Dr. Mardelle Matte 10/3: Transfused 1u PRBCs 10/4: Mild delirium overnight     Assessment and Plan: * Closed right hip fracture ([place]) - Consult Orthopedics, appreciate cares  Protein-calorie malnutrition, severe As evidenced by severe loss of subcutaneous muscel mass and fat - Consult dietitian - Dietary protein supplements  Fall at home, initial encounter    Dementia [facility]) Had some mild delirium overnight, clinically insignificant at this time - Hold diazepam - Continue Seroquel    Acute postoperative anemia due to expected blood loss Hgb baseline 10s, was 9.9 on admission, post-op down to 7.4 and symptomatic. Transfused 1u PRBCs 10/3, today asymptomatic and Hgb up to 9s.    Essential hypertension BP still very elevated - Continue losartan, diltaizem, metoprolol - Resume prazosin  Anxiety - Hold home Valium  Hyperlipidemia - Continue atorvastatin          Subjective: Patient fairly confused.  Has some soreness in the right hip.  No vomiting, no respiratory symptoms, no agitation, no loss of consciousness.     Physical Exam: BP (!) 135/51   Pulse 84   Temp 98.1 F (36.7 C) (Oral)   Resp 15   Ht '4\' 11"'$  (1.499 m)   Wt 38.9 kg   SpO2 98%   BMI 17.32 kg/m   Elderly female, sitting up in recliner, no acute distress RRR, no murmurs, no peripheral edema Respiratory rate normal, lungs clear without rales or wheezes Abdomen soft no tenderness palpation or guarding, no ascites or distention  Data Reviewed: Basic metabolic panel shows creatinine stable, sodium 134, insignificant Hemoglobin up to 9.4 on  hemogram White blood cell count 12, reactive  Family Communication: Son at the bedside    Disposition: Status is: Inpatient Patient was admitted for hip fracture, she underwent uncomplicated fracture readmitted 2 days ago, got blood transfusion yesterday and is doing well today.  Likely stable for discharge to SNF tomorrow        Author: Edwin Dada, MD 04/10/2022 11:19 AM  For on call review [email or web address].

## 2022-04-10 NOTE — Progress Notes (Signed)
Transition of Care (TOC) -30 day Note       Patient Details  Name: Gabrielle Warren MRN: [id number] Date of Birth: [birth year]   Transition of Care [facility]) CM/SW Contact  Name:  Lennart Pall, [place] Phone Number: Date:  04/10/2022 Time:  2/45pm   MUST ID:  [id number]   To Whom it May Concern:   Please be advised that the above patient will require a short-term nursing home stay, anticipated 30 days or less rehabilitation and strengthening. The plan is for return home.

## 2022-04-10 NOTE — Progress Notes (Signed)
Subjective: 2 Days Post-Op s/p Procedure(s): INTRAMEDULLARY (IM) NAIL FEMORAL  Patient sitting up in chair, comfortable. States she feels a little sore all over. Worked with PT yesterday and had some fatigue and dizziness.    Objective:  PE: VITALS:   Vitals:   04/09/22 1747 04/09/22 1850 04/09/22 2200 04/10/22 0529  BP: (!) 153/66 (!) 170/83 (!) 160/84   Pulse: 88 88 88 84  Resp: '17 15 15 14  '$ Temp: 98.5 F (36.9 C) 98.2 F (36.8 C) 98.4 F (36.9 C) 98.2 F (36.8 C)  TempSrc:  Oral Oral Oral  SpO2: 96% 99% 99% 98%  Weight:      Height:       General:alert, sitting up in chair, in no acute distress MSK: surgical dressings with mild drainage, changed this morning. Dorsiflexion and plantarflexion intact. Foot warm and well perfused, + PT pulse. Patient endorses sensation to foot and toes.  LABS  Results for orders placed or performed during the hospital encounter of 04/08/22 (from the past 24 hour(s))  Prepare RBC (crossmatch)     Status: None   Collection Time: 04/09/22 11:09 AM  Result Value Ref Range   Order Confirmation      ORDER PROCESSED BY BLOOD BANK Performed at [facility] [address], [place], [place] [address]     Pelvis Portable  Result Date: 04/08/2022 CLINICAL DATA:  Internal fixation of fracture in the right femur EXAM: PORTABLE PELVIS 1-2 VIEWS COMPARISON:  Study done earlier today FINDINGS: There is interval internal fixation of comminuted fracture of intertrochanteric portion and proximal shaft of right femur. There is 10 mm offset in alignment of lateral cortical margin at the fracture site. IMPRESSION: Interval internal fixation of fracture of proximal right femur with intramedullary rod. Electronically Signed   By: Elmer Picker M.D.   On: 04/08/2022 20:14   DG FEMUR, MIN 2 VIEWS RIGHT  Result Date: 04/08/2022 CLINICAL DATA:  Fluoroscopic assistance for internal fixation of fracture of right femur EXAM: RIGHT FEMUR  2 VIEWS COMPARISON:  Study done earlier today FINDINGS: Fluoroscopic images show internal fixation of comminuted fracture of proximal shaft and intertrochanteric portion of right femur with intramedullary rod. Fluoroscopy time 44 seconds. Radiation dose 5.1 mGy. IMPRESSION: Fluoroscopic assistance was provided for internal fixation of fracture of proximal right femur. Electronically Signed   By: Elmer Picker M.D.   On: 04/08/2022 20:13   DG Hip Port Unilat With Pelvis 1V Right  Result Date: 04/08/2022 CLINICAL DATA:  Internal fixation of fracture of proximal right femur EXAM: DG HIP (WITH OR WITHOUT PELVIS) 1V PORT RIGHT COMPARISON:  Study done earlier today FINDINGS: There is interval internal fixation of comminuted fracture of intertrochanteric region of femur and proximal shaft of the femur. There is residual offset in alignment of lateral cortex. Intramedullary rod is noted in place. IMPRESSION: Interval internal fixation of comminuted fracture of proximal right femur with intramedullary rod. Electronically Signed   By: Elmer Picker M.D.   On: 04/08/2022 20:12   DG C-Arm 1-60 Min-No Report  Result Date: 04/08/2022 Fluoroscopy was utilized by the requesting physician.  No radiographic interpretation.   CT HEAD WO CONTRAST  Result Date: 04/08/2022 CLINICAL DATA:  Witnessed fall EXAM: CT HEAD WITHOUT CONTRAST TECHNIQUE: Contiguous axial images were obtained from the base of the skull through the vertex without intravenous contrast. RADIATION DOSE REDUCTION: This exam was performed according to the departmental dose-optimization program which includes automated exposure control, adjustment of the  mA and/or kV according to patient size and/or use of iterative reconstruction technique. COMPARISON:  02/10/2020 FINDINGS: Brain: Age related volume loss. Chronic small-vessel ischemic changes of the cerebral hemispheric white matter. No sign of acute infarction, mass lesion, hemorrhage,  hydrocephalus or extra-axial collection. Vascular: There is atherosclerotic calcification of the major vessels at the base of the brain. Skull: Negative Sinuses/Orbits: Clear/normal Other: None IMPRESSION: No acute or traumatic finding. Age related volume loss. Chronic small-vessel ischemic changes of the cerebral hemispheric white matter. Electronically Signed   By: Nelson Chimes M.D.   On: 04/08/2022 13:32   CT CERVICAL SPINE WO CONTRAST  Result Date: 04/08/2022 CLINICAL DATA:  Witnessed fall EXAM: CT CERVICAL SPINE WITHOUT CONTRAST TECHNIQUE: Multidetector CT imaging of the cervical spine was performed without intravenous contrast. Multiplanar CT image reconstructions were also generated. RADIATION DOSE REDUCTION: This exam was performed according to the departmental dose-optimization program which includes automated exposure control, adjustment of the mA and/or kV according to patient size and/or use of iterative reconstruction technique. COMPARISON:  None Available. FINDINGS: Alignment: Chronic straightening and kyphotic curvature in the cervical region probably due to facet arthropathy. No traumatic malalignment. Skull base and vertebrae: No regional fracture. Soft tissues and spinal canal: No traumatic soft tissue finding. Disc levels: Chronic degenerative spondylosis at C4-5, C5-6 and C6-7 with mild foraminal encroachment. Chronic facet arthropathy on the left at C3-4, C4-5, C5-6, C6-7 and C7-T1 with mild foraminal encroachment due to facet osteophytes. No central canal stenosis. Upper chest: Negative Other: None IMPRESSION: No acute or traumatic finding. Chronic degenerative spondylosis and facet arthropathy as outlined above. Electronically Signed   By: Nelson Chimes M.D.   On: 04/08/2022 13:31   DG Hip Unilat With Pelvis 2-3 Views Right  Result Date: 04/08/2022 CLINICAL DATA:  Right hip pain after fall. EXAM: DG HIP (WITH OR WITHOUT PELVIS) 2-3V RIGHT COMPARISON:  None Available. FINDINGS: Severely  displaced and comminuted fracture is seen involving their intertrochanteric region of the proximal right femur. IMPRESSION: Severely displaced and comminuted intertrochanteric fracture of proximal right femur. Electronically Signed   By: Marijo Conception M.D.   On: 04/08/2022 13:15    Assessment/Plan: Principal Problem:   Closed right hip fracture (HCC) Active Problems:   Hyperlipidemia   Anxiety   Essential hypertension   Acute postoperative anemia due to expected blood loss   Dementia ([place])   Fall at home, initial encounter   Protein-calorie malnutrition, severe   2 Days Post-Op s/p Procedure(s): INTRAMEDULLARY (IM) NAIL FEMORAL - hbg dropped to 7.4 yesterday, s/p 1 unit PRBC. CBC not yet back this morning, holding lovenox for now  Weightbearing: WBAT RLE Insicional and dressing care: Reinforce dressings as needed, dressings changed this morning VTE prophylaxis: plan for lovenox, awaiting CBC this morning after transfusion yesterday  Pain control: continue current regimen, tylenol should be first line for pain, limit narcotics as much as possible. Follow - up plan: 2 weeks with Dr. Mardelle Matte Dispo: pending PT eval   Contact information:   Merlene Pulling, PA-C Weekdays 8-5  After hours and holidays please check [email or web address] for group call information for [facility]  Ventura Bruns 04/10/2022, 9:53 AM

## 2022-04-10 NOTE — Plan of Care (Signed)
  Problem: Health Behavior/Discharge Planning: Goal: Ability to manage health-related needs will improve Outcome: Progressing   Problem: Clinical Measurements: Goal: Ability to maintain clinical measurements within normal limits will improve Outcome: Progressing Goal: Will remain free from infection Outcome: Progressing Goal: Respiratory complications will improve Outcome: Progressing

## 2022-04-11 DIAGNOSIS — S72001A Fracture of unspecified part of neck of right femur, initial encounter for closed fracture: Secondary | ICD-10-CM | POA: Diagnosis not present

## 2022-04-11 LAB — BASIC METABOLIC PANEL
Anion gap: 6 (ref 5–15)
BUN: 27 mg/dL — ABNORMAL HIGH (ref 8–23)
CO2: 26 mmol/L (ref 22–32)
Calcium: 8.2 mg/dL — ABNORMAL LOW (ref 8.9–10.3)
Chloride: 102 mmol/L (ref 98–111)
Creatinine, Ser: 1.08 mg/dL — ABNORMAL HIGH (ref 0.44–1.00)
GFR, Estimated: 48 mL/min — ABNORMAL LOW (ref >60)
Glucose, Bld: 107 mg/dL — ABNORMAL HIGH (ref 70–99)
Potassium: 3.6 mmol/L (ref 3.5–5.1)
Sodium: 134 mmol/L — ABNORMAL LOW (ref 135–145)

## 2022-04-11 LAB — CBC
HCT: 24.6 % — ABNORMAL LOW (ref 36.0–46.0)
Hemoglobin: 8.3 g/dL — ABNORMAL LOW (ref 12.0–15.0)
MCH: 30.6 pg (ref 26.0–34.0)
MCHC: 33.7 g/dL (ref 30.0–36.0)
MCV: 90.8 fL (ref 80.0–100.0)
Platelets: 125 10*3/uL — ABNORMAL LOW (ref 150–400)
RBC: 2.71 MIL/uL — ABNORMAL LOW (ref 3.87–5.11)
RDW: 14 % (ref 11.5–15.5)
WBC: 11.9 10*3/uL — ABNORMAL HIGH (ref 4.0–10.5)
nRBC: 0 % (ref 0.0–0.2)

## 2022-04-11 MED ORDER — ENOXAPARIN SODIUM 60 MG/0.6ML IJ SOSY: 20.0000 mg | PREFILLED_SYRINGE | INTRAMUSCULAR | 0 refills | Status: AC

## 2022-04-11 MED ORDER — SENNA 8.6 MG PO TABS: 1.0000 | ORAL_TABLET | Freq: Two times a day (BID) | ORAL | 0 refills | Status: AC

## 2022-04-11 MED ORDER — FERROUS SULFATE 325 (65 FE) MG PO TABS: 325.0000 mg | ORAL_TABLET | ORAL | 3 refills | Status: AC

## 2022-04-11 MED ORDER — TRAMADOL HCL 50 MG PO TABS: 50.0000 mg | ORAL_TABLET | Freq: Four times a day (QID) | ORAL | 0 refills | Status: AC | PRN

## 2022-04-11 MED ORDER — ENOXAPARIN SODIUM 40 MG/0.4ML IJ SOSY: 20.0000 mg | PREFILLED_SYRINGE | INTRAMUSCULAR | 0 refills | Status: DC

## 2022-04-11 NOTE — Plan of Care (Signed)
  Problem: Activity: Goal: Risk for activity intolerance will decrease Outcome: Progressing   Problem: Pain Managment: Goal: General experience of comfort will improve Outcome: Progressing   Problem: Safety: Goal: Ability to remain free from injury will improve Outcome: Progressing   

## 2022-04-11 NOTE — Progress Notes (Signed)
Called report to nurse at [facility]. 

## 2022-04-11 NOTE — Progress Notes (Signed)
Physical Therapy Treatment Patient Details Name: Gabrielle Warren MRN: [id number] DOB: [birth year] Today's Date: 04/11/2022   History of Present Illness 88 yo female admitted with R subtrochanteric femur fx s/p IM nail 10/2. Hx of dementia, anxiety    PT Comments    Pt is progressing with mobility. Pt presents with dementia and is oriented X1. Pt was unable to name her son who entered the room during therapy. Pt was able to assist with bed mobility and short distance ambulation with a RW min assist. Pt is a high fall risk and will need SNF placement for rehab. Pt completed HEP with min assist and will continue to benefit from acute skilled PT to maximize mobility and Independence for d/c to the next venue of care.  Recommendations for follow up therapy are one component of a multi-disciplinary discharge planning process, led by the attending physician.  Recommendations may be updated based on patient status, additional functional criteria and insurance authorization.  Follow Up Recommendations  Skilled nursing-short term rehab (<3 hours/day) Can patient physically be transported by private vehicle: Yes   Assistance Recommended at Discharge Frequent or constant Supervision/Assistance  Patient can return home with the following Assist for transportation;Assistance with cooking/housework;Help with stairs or ramp for entrance;A little help with bathing/dressing/bathroom;A little help with walking and/or transfers   Equipment Recommendations  Rolling walker (2 wheels)    Recommendations for Other Services       Precautions / Restrictions Precautions Precautions: Fall Restrictions Weight Bearing Restrictions: No RLE Weight Bearing: Weight bearing as tolerated     Mobility  Bed Mobility Overal bed mobility: Needs Assistance Bed Mobility: Supine to Sit     Supine to sit: Min assist, HOB elevated     General bed mobility comments: Assist for R LE. Increased time. Cues  required    Transfers Overall transfer level: Needs assistance Equipment used: Rolling walker (2 wheels) Transfers: Sit to/from Stand Sit to Stand: Min assist   Step pivot transfers: Min assist       General transfer comment: Assist to rise, steady, control descent, manage RW. Cues for safety, technique.    Ambulation/Gait Ambulation/Gait assistance: Min assist Gait Distance (Feet): 5 Feet Assistive device: Rolling walker (2 wheels) Gait Pattern/deviations: Step-to pattern, Antalgic, Trunk flexed Gait velocity: decreased         Stairs             Wheelchair Mobility    Modified Rankin (Stroke Patients Only)       Balance Overall balance assessment: Needs assistance, History of Falls   Sitting balance-Leahy Scale: Fair     Standing balance support: Bilateral upper extremity supported, Reliant on assistive device for balance, During functional activity Standing balance-Leahy Scale: Poor                              Cognition Arousal/Alertness: Awake/alert Behavior During Therapy: WFL for tasks assessed/performed Overall Cognitive Status: History of cognitive impairments - at baseline                                 General Comments: A&O x 1. Pleasant. Follows commands        Exercises Total Joint Exercises Ankle Circles/Pumps: AROM, Strengthening, Both, 10 reps, Seated Quad Sets: AROM, Strengthening, Both, 10 reps, Seated Heel Slides: AAROM, Strengthening, Right, 10 reps, Seated Hip ABduction/ADduction: AAROM, Strengthening, Right, 10 reps, Seated  Long Arc Quad: AROM, Strengthening, Right, 10 reps, Seated    General Comments General comments (skin integrity, edema, etc.): Pt's son entered the room during therapy. Pt assisted with BSC transfer min assist for balance while NT assisted with cleaning.      Pertinent Vitals/Pain Pain Assessment Pain Assessment: Faces Faces Pain Scale: Hurts even more Pain Location: R  thigh/LE with activity Pain Descriptors / Indicators: Discomfort, Guarding, Sore, Grimacing Pain Intervention(s): Limited activity within patient's tolerance, Monitored during session    Home Living                          Prior Function            PT Goals (current goals can now be found in the care plan section) Progress towards PT goals: Progressing toward goals    Frequency    Min 3X/week      PT Plan Current plan remains appropriate    Co-evaluation              AM-PAC PT "6 Clicks" Mobility   Outcome Measure  Help needed turning from your back to your side while in a flat bed without using bedrails?: A Little Help needed moving from lying on your back to sitting on the side of a flat bed without using bedrails?: A Little Help needed moving to and from a bed to a chair (including a wheelchair)?: A Little Help needed standing up from a chair using your arms (e.g., wheelchair or bedside chair)?: A Little Help needed to walk in hospital room?: A Little Help needed climbing 3-5 steps with a railing? : Total 6 Click Score: 16    End of Session Equipment Utilized During Treatment: Gait belt Activity Tolerance: Patient limited by fatigue;Patient tolerated treatment well;Patient limited by pain Patient left: with call bell/phone within reach;in chair;with chair alarm set;with family/visitor present Nurse Communication: Mobility status PT Visit Diagnosis: Pain;Muscle weakness (generalized) (M62.81);Difficulty in walking, not elsewhere classified (R26.2) Pain - Right/Left: Right Pain - part of body: Hip     Time: 3329-5188 PT Time Calculation (min) (ACUTE ONLY): 25 min  Charges:  $Gait Training: 8-22 mins $Therapeutic Exercise: 8-22 mins                        Lelon Mast 04/11/2022, 10:33 AM

## 2022-04-11 NOTE — Progress Notes (Signed)
Pt transported to [facility] via [facility].  Report given to [facility] staff.  Pt alert to self, VSS.

## 2022-04-11 NOTE — TOC Transition Note (Signed)
Transition of Care [facility]) - CM/SW Discharge Note   Patient Details  Name: Gabrielle Warren MRN: [id number] Date of Birth: 1926/03/18  [facility] CM/SW Contact:  Lennart Pall, [name] Phone Number: 04/11/2022, 3:06 PM   Clinical Narrative:    Have reviewed SNF bed offers with pt's son, Gwyndolyn Saxon and bed accepted at [facility].  Pt medically cleared for dc today and family agreeable. PTAR called at 3pm.  RN to call report to [phone]. No further TOC needs.   Final next level of care: Skilled Nursing Facility Barriers to Discharge: Barriers Resolved   Patient Goals and CMS Choice Patient states their goals for this hospitalization and ongoing recovery are:: eventually admit to memory care after snf rehab      Discharge Placement PASRR number recieved: 04/10/22            Patient chooses bed at: [facility] Patient to be transferred to facility by: [facility] Name of family member notified: son, Gwyndolyn Saxon Patient and family notified of of transfer: 04/11/22  Discharge Plan and Services In-house Referral: Clinical Social Work   Post Acute Care Choice: [facility]          DME Arranged: N/A DME Agency: NA                  Social Determinants of Health (SDOH) Interventions     Readmission Risk Interventions    04/10/2022    2:48 PM  Readmission Risk Prevention Plan  Transportation Screening Complete  PCP or Specialist Appt within 5-7 Days Complete  Home Care Screening Complete  Medication Review (RN CM) Complete

## 2022-04-11 NOTE — Plan of Care (Signed)
Plan of care reviewed and discussed. °

## 2022-04-11 NOTE — Discharge Summary (Signed)
Physician Discharge Summary   Patient: Gabrielle Warren MRN: [id number] DOB: 04/05/1926  Admit date:     04/08/2022  Discharge date: 04/11/22  Discharge Physician: Gabrielle Warren   PCP: Gabrielle Rail, MD     Recommendations at discharge:  [place]: Please check Hgb in 4 days Follow up with Dr. Mardelle Warren Orthopedics as directed     Discharge Diagnoses: Principal Problem:   Closed right hip fracture [facility]) Active Problems:   Hyperlipidemia   Anxiety   Essential hypertension   Acute postoperative anemia due to expected blood loss   Dementia ([place])   Fall at home, initial encounter   Protein-calorie malnutrition, severe     Hospital Course: Gabrielle Warren is a 88 y.o. F with dementia, lives at home, HTN, HLD and anxiety who presented with         * Closed right hip fracture [facility]) Admitted and taken to OR for RIGHT intramedullary femoral nail on 10/2 by Dr. Mardelle Warren.   Post-operative anemia requiring transfusion, but otherwise uncomplicated.     Dementia ([place]) Avoid diazepam, benzodiazepines.  Continue Seroquel.   Acute postoperative anemia due to expected blood loss Hgb baseline 10s, was 9.9 on admission, post-op down to 7.4 and symptomatic.  Transfused 1 unit PRBCs post-op Hgb increased appropriately to 8.3 g/dL.  I think the fluctuation from yesterday to today is equilibration.   No clinical bleeding, appears well.  Discharge on oral iron.           [facility] Controlled Substances Registry was reviewed for this patient prior to discharge.  Consultants: Orthopedics, Dr. Mardelle Warren Procedures performed: Right femoral intramedullary nailing  Disposition: Skilled nursing facility   DISCHARGE MEDICATION: Allergies as of 04/11/2022       Reactions   Fosamax [alendronate Sodium] Other (See Comments)   Bleeding gums        Medication List     STOP taking these medications    diazepam 5 MG tablet Commonly known as: VALIUM    diltiazem 120 MG 24 hr tablet Commonly known as: CARDIZEM LA       TAKE these medications    amoxicillin 500 MG tablet Commonly known as: AMOXIL Take 2,000 mg by mouth See admin instructions. Take 2,000 mg by mouth one hour prior to dental appointment   aspirin EC 81 MG tablet Take 81 mg by mouth at bedtime.   bisacodyl 5 MG EC tablet Generic drug: bisacodyl Take 1 tablet (5 mg total) by mouth daily as needed for moderate constipation. What changed: reasons to take this   Calcium Carb-Cholecalciferol [phone] MG-UNIT Caps Take 2 capsules by mouth daily.   diltiazem 120 MG 24 hr capsule Commonly known as: CARDIZEM CD TAKE 1 CAPSULE(120 MG) BY MOUTH DAILY   enoxaparin 40 MG/0.4ML injection Commonly known as: LOVENOX Inject 0.2 mLs (20 mg total) into the skin daily. Inject half of syringe and dispose of rest of medication.   ferrous sulfate 325 (65 FE) MG tablet Take 1 tablet (325 mg total) by mouth every other day.   ICAPS AREDS FORMULA PO Take 2 capsules by mouth daily.   losartan 100 MG tablet Commonly known as: COZAAR TAKE 1 TABLET(100 MG) BY MOUTH DAILY What changed:  how much to take how to take this when to take this additional instructions   metoprolol succinate 25 MG 24 hr tablet Commonly known as: TOPROL-XL TAKE 1 TABLET(25 MG) BY MOUTH TWICE DAILY What changed: See the new instructions.   multivitamin with  minerals Tabs tablet Take 1 tablet by mouth daily with lunch.   prazosin 2 MG capsule Commonly known as: MINIPRESS TAKE 2 CAPSULES(4 MG) BY MOUTH TWICE DAILY What changed:  how much to take how to take this when to take this additional instructions   psyllium 58.6 % powder Commonly known as: METAMUCIL Take 1 packet by mouth 3 (three) times daily as needed (for constipation).   QUEtiapine 50 MG tablet Commonly known as: SEROQUEL TAKE 1 TABLET(50 MG) BY MOUTH AT BEDTIME What changed: See the new instructions.   senna 8.6 MG Tabs  tablet Commonly known as: SENOKOT Take 1 tablet (8.6 mg total) by mouth 2 (two) times daily.   simvastatin 20 MG tablet Commonly known as: ZOCOR TAKE 1 TABLET BY MOUTH EVERY DAY What changed: when to take this   Stool Softener 100 MG capsule Generic drug: docusate sodium TAKE 2 CAPSULES(200 MG) BY MOUTH DAILY FOR CONSTIPATION What changed: See the new instructions.   traMADol 50 MG tablet Commonly known as: Ultram Take 1 tablet (50 mg total) by mouth every 6 (six) hours as needed.               Discharge Care Instructions  (From admission, onward)           Start     Ordered   04/11/22 0000  Discharge wound care:       Comments: Leave dressing in place until Orthopedics appointment   04/11/22 1344            Contact information for follow-up providers     Gabrielle Bond, MD. Schedule an appointment as soon as possible for a visit in 2 week(s).   Specialty: Orthopedic Surgery Contact information: [place]  [phone]              Contact information for after-discharge care     Destination     [facility] Preferred SNF .   Service: Skilled Nursing Contact information: [address] [place] [phone]                     Discharge Instructions     Discharge wound care:   Complete by: As directed    Leave dressing in place until Orthopedics appointment   Increase activity slowly   Complete by: As directed        Discharge Exam: Filed Weights   04/08/22 1646 04/08/22 2110  Weight: 43.1 kg 38.9 kg    General: Pt is alert, awake, not in acute distress, sitting up in bed, interactive but sleepy Cardiovascular: RRR, nl S1-S2, no murmurs appreciated.   No LE edema.   Respiratory: Normal respiratory rate and rhythm.  CTAB without rales or wheezes. Abdominal: Abdomen soft and non-tender.  No distension or HSM.   MSK: Right leg  movement gaurded, no bruising or drainage from wounds.  Dressings clean and dry. Neuro/Psych: Strength symmetric in upper and lower extremities.  Judgment and insight appear at baseline, with dementia..   Condition at discharge: stable  The results of significant diagnostics from this hospitalization (including imaging, microbiology, ancillary and laboratory) are listed below for reference.   Imaging Studies: Pelvis Portable  Result Date: 04/08/2022 CLINICAL DATA:  Internal fixation of fracture in the right femur EXAM: PORTABLE PELVIS 1-2 VIEWS COMPARISON:  Study done earlier today FINDINGS: There is interval internal fixation of comminuted fracture of intertrochanteric portion and proximal shaft of  right femur. There is 10 mm offset in alignment of lateral cortical margin at the fracture site. IMPRESSION: Interval internal fixation of fracture of proximal right femur with intramedullary rod. Electronically Signed   By: Elmer Picker M.D.   On: 04/08/2022 20:14   DG FEMUR, MIN 2 VIEWS RIGHT  Result Date: 04/08/2022 CLINICAL DATA:  Fluoroscopic assistance for internal fixation of fracture of right femur EXAM: RIGHT FEMUR 2 VIEWS COMPARISON:  Study done earlier today FINDINGS: Fluoroscopic images show internal fixation of comminuted fracture of proximal shaft and intertrochanteric portion of right femur with intramedullary rod. Fluoroscopy time 44 seconds. Radiation dose 5.1 mGy. IMPRESSION: Fluoroscopic assistance was provided for internal fixation of fracture of proximal right femur. Electronically Signed   By: Elmer Picker M.D.   On: 04/08/2022 20:13   DG Hip Port Unilat With Pelvis 1V Right  Result Date: 04/08/2022 CLINICAL DATA:  Internal fixation of fracture of proximal right femur EXAM: DG HIP (WITH OR WITHOUT PELVIS) 1V PORT RIGHT COMPARISON:  Study done earlier today FINDINGS: There is interval internal fixation of comminuted fracture of intertrochanteric region of femur and  proximal shaft of the femur. There is residual offset in alignment of lateral cortex. Intramedullary rod is noted in place. IMPRESSION: Interval internal fixation of comminuted fracture of proximal right femur with intramedullary rod. Electronically Signed   By: Elmer Picker M.D.   On: 04/08/2022 20:12   DG C-Arm 1-60 Min-No Report  Result Date: 04/08/2022 Fluoroscopy was utilized by the requesting physician.  No radiographic interpretation.   CT HEAD WO CONTRAST  Result Date: 04/08/2022 CLINICAL DATA:  Witnessed fall EXAM: CT HEAD WITHOUT CONTRAST TECHNIQUE: Contiguous axial images were obtained from the base of the skull through the vertex without intravenous contrast. RADIATION DOSE REDUCTION: This exam was performed according to the departmental dose-optimization program which includes automated exposure control, adjustment of the mA and/or kV according to patient size and/or use of iterative reconstruction technique. COMPARISON:  02/10/2020 FINDINGS: Brain: Age related volume loss. Chronic small-vessel ischemic changes of the cerebral hemispheric white matter. No sign of acute infarction, mass lesion, hemorrhage, hydrocephalus or extra-axial collection. Vascular: There is atherosclerotic calcification of the major vessels at the base of the brain. Skull: Negative Sinuses/Orbits: Clear/normal Other: None IMPRESSION: No acute or traumatic finding. Age related volume loss. Chronic small-vessel ischemic changes of the cerebral hemispheric white matter. Electronically Signed   By: Nelson Chimes M.D.   On: 04/08/2022 13:32   CT CERVICAL SPINE WO CONTRAST  Result Date: 04/08/2022 CLINICAL DATA:  Witnessed fall EXAM: CT CERVICAL SPINE WITHOUT CONTRAST TECHNIQUE: Multidetector CT imaging of the cervical spine was performed without intravenous contrast. Multiplanar CT image reconstructions were also generated. RADIATION DOSE REDUCTION: This exam was performed according to the departmental  dose-optimization program which includes automated exposure control, adjustment of the mA and/or kV according to patient size and/or use of iterative reconstruction technique. COMPARISON:  None Available. FINDINGS: Alignment: Chronic straightening and kyphotic curvature in the cervical region probably due to facet arthropathy. No traumatic malalignment. Skull base and vertebrae: No regional fracture. Soft tissues and spinal canal: No traumatic soft tissue finding. Disc levels: Chronic degenerative spondylosis at C4-5, C5-6 and C6-7 with mild foraminal encroachment. Chronic facet arthropathy on the left at C3-4, C4-5, C5-6, C6-7 and C7-T1 with mild foraminal encroachment due to facet osteophytes. No central canal stenosis. Upper chest: Negative Other: None IMPRESSION: No acute or traumatic finding. Chronic degenerative spondylosis and facet arthropathy as outlined above. Electronically Signed  By: Nelson Chimes M.D.   On: 04/08/2022 13:31   DG Hip Unilat With Pelvis 2-3 Views Right  Result Date: 04/08/2022 CLINICAL DATA:  Right hip pain after fall. EXAM: DG HIP (WITH OR WITHOUT PELVIS) 2-3V RIGHT COMPARISON:  None Available. FINDINGS: Severely displaced and comminuted fracture is seen involving their intertrochanteric region of the proximal right femur. IMPRESSION: Severely displaced and comminuted intertrochanteric fracture of proximal right femur. Electronically Signed   By: Marijo Conception M.D.   On: 04/08/2022 13:15    Microbiology: Results for orders placed or performed in visit on 07/26/13  Urine Culture     Status: None   Collection Time: 07/26/13  4:53 PM   Specimen: Urine  Result Value Ref Range Status   Culture ESCHERICHIA COLI  Final   Colony Count >=100,000 COLONIES/ML  Final   Organism ID, Bacteria ESCHERICHIA COLI  Final      Susceptibility   Escherichia coli -  (no method available)    AMPICILLIN >=32 Resistant     AMPICILLIN/SULBACTAM >=32 Resistant     PIP/TAZO <=4 Sensitive      IMIPENEM <=0.25 Sensitive     CEFAZOLIN 8 Sensitive     CEFOXITIN <=4 Sensitive     CEFTRIAXONE <=1 Sensitive     CEFTAZIDIME <=1 Sensitive     CEFEPIME <=1 Sensitive     GENTAMICIN <=1 Sensitive     TOBRAMYCIN <=1 Sensitive     CIPROFLOXACIN <=0.25 Sensitive     LEVOFLOXACIN <=0.12 Sensitive     NITROFURANTOIN 32 Sensitive     TRIMETH/SULFA >=320 Resistant     Labs: CBC: Recent Labs  Lab 04/08/22 1250 04/09/22 0342 04/10/22 1019 04/11/22 0355  WBC 12.1* 13.0* 12.6* 11.9*  NEUTROABS 9.3*  --   --   --   HGB 9.9* 7.4* 9.7* 8.3*  HCT 30.0* 22.2* 28.9* 24.6*  MCV 93.5 93.7 90.6 90.8  PLT 175 144* 121* 161*   Basic Metabolic Panel: Recent Labs  Lab 04/08/22 1250 04/09/22 0342 04/10/22 1019 04/11/22 0355  NA 134* 135 134* 134*  K 4.3 4.4 3.6 3.6  CL 103 106 100 102  CO2 '25 24 25 26  '$ GLUCOSE 160* 129* 172* 107*  BUN '15 16 15 '$ 27*  CREATININE 0.91 0.92 0.86 1.08*  CALCIUM 8.9 8.4* 8.4* 8.2*   Liver Function Tests: No results for input(s): "AST", "ALT", "ALKPHOS", "BILITOT", "PROT", "ALBUMIN" in the last 168 hours. CBG: No results for input(s): "GLUCAP" in the last 168 hours.  Discharge time spent: approximately 25 minutes spent on discharge counseling, evaluation of patient on day of discharge, and coordination of discharge planning with nursing, social work, pharmacy and case management  Signed: Edwin Dada, MD [facility] 04/11/2022

## 2022-04-11 NOTE — Progress Notes (Signed)
Subjective: 3 Days Post-Op s/p Procedure(s): INTRAMEDULLARY (IM) NAIL FEMORAL  Patient laying in bed, somnolent on exam, but easily awoken. Denies pain this morning. Denies any other complaints.   Objective:  PE: VITALS:   Vitals:   04/10/22 1046 04/10/22 1447 04/10/22 2231 04/11/22 0601  BP: (!) 135/51 (!) 146/59 (!) 122/48 (!) 152/62  Pulse: 84 76 79 67  Resp: '15  17 16  '$ Temp: 98.1 F (36.7 C) 98.2 F (36.8 C) 98.9 F (37.2 C) 98.4 F (36.9 C)  TempSrc: Oral Oral Oral Oral  SpO2: 98% 98% 97% 100%  Weight:      Height:       General:laying in bed, in no acute distress MSK: surgical dressings with no drainage, distal incision was not covered, I recovered this with another mepilex dressing. Dorsiflexion and plantarflexion intact. Foot warm and well perfused, + PT pulse. Patient endorses sensation to foot and toes.  LABS  Results for orders placed or performed during the hospital encounter of 04/08/22 (from the past 24 hour(s))  CBC     Status: Abnormal   Collection Time: 04/10/22 10:19 AM  Result Value Ref Range   WBC 12.6 (H) 4.0 - 10.5 K/uL   RBC 3.19 (L) 3.87 - 5.11 MIL/uL   Hemoglobin 9.7 (L) 12.0 - 15.0 g/dL   HCT 28.9 (L) 36.0 - 46.0 %   MCV 90.6 80.0 - 100.0 fL   MCH 30.4 26.0 - 34.0 pg   MCHC 33.6 30.0 - 36.0 g/dL   RDW 14.2 11.5 - 15.5 %   Platelets 121 (L) 150 - 400 K/uL   nRBC 0.0 0.0 - 0.2 %  Basic metabolic panel     Status: Abnormal   Collection Time: 04/10/22 10:19 AM  Result Value Ref Range   Sodium 134 (L) 135 - 145 mmol/L   Potassium 3.6 3.5 - 5.1 mmol/L   Chloride 100 98 - 111 mmol/L   CO2 25 22 - 32 mmol/L   Glucose, Bld 172 (H) 70 - 99 mg/dL   BUN 15 8 - 23 mg/dL   Creatinine, Ser 0.86 0.44 - 1.00 mg/dL   Calcium 8.4 (L) 8.9 - 10.3 mg/dL   GFR, Estimated >60 >60 mL/min   Anion gap 9 5 - 15  CBC     Status: Abnormal   Collection Time: 04/11/22  3:55 AM  Result Value Ref Range   WBC 11.9 (H) 4.0 - 10.5 K/uL   RBC 2.71 (L) 3.87 -  5.11 MIL/uL   Hemoglobin 8.3 (L) 12.0 - 15.0 g/dL   HCT 24.6 (L) 36.0 - 46.0 %   MCV 90.8 80.0 - 100.0 fL   MCH 30.6 26.0 - 34.0 pg   MCHC 33.7 30.0 - 36.0 g/dL   RDW 14.0 11.5 - 15.5 %   Platelets 125 (L) 150 - 400 K/uL   nRBC 0.0 0.0 - 0.2 %  Basic metabolic panel     Status: Abnormal   Collection Time: 04/11/22  3:55 AM  Result Value Ref Range   Sodium 134 (L) 135 - 145 mmol/L   Potassium 3.6 3.5 - 5.1 mmol/L   Chloride 102 98 - 111 mmol/L   CO2 26 22 - 32 mmol/L   Glucose, Bld 107 (H) 70 - 99 mg/dL   BUN 27 (H) 8 - 23 mg/dL   Creatinine, Ser 1.08 (H) 0.44 - 1.00 mg/dL   Calcium 8.2 (L) 8.9 - 10.3 mg/dL   GFR, Estimated 48 (L) >60  mL/min   Anion gap 6 5 - 15    No results found.  Assessment/Plan: Principal Problem:   Closed right hip fracture (HCC) Active Problems:   Hyperlipidemia   Anxiety   Essential hypertension   Acute postoperative anemia due to expected blood loss   Dementia ([place])   Fall at home, initial encounter   Protein-calorie malnutrition, severe   3 Days Post-Op s/p Procedure(s): INTRAMEDULLARY (IM) NAIL FEMORAL - hbg increased to 9.7 this morning, drop to 8.3 this morning, will plan to start lovenox  Weightbearing: WBAT RLE Insicional and dressing care: Reinforce dressings as needed, dressings changed this morning VTE prophylaxis: will go ahead and start lovenox Pain control: continue current regimen, tylenol should be first line for pain, limit narcotics as much as possible. Follow - up plan: 2 weeks with Dr. Mardelle Matte Dispo: PT recommending SNF, ok to discharge today from ortho perspective  Contact information:   Merlene Pulling, PA-C Weekdays 8-5  After hours and holidays please check [email or web address] for group call information for [facility]  Ventura Bruns 04/11/2022, 9:02 AM

## 2022-04-24 ENCOUNTER — Other Ambulatory Visit: Payer: Self-pay | Admitting: *Deleted

## 2022-04-24 NOTE — Patient Outreach (Signed)
Per [facility] Mrs. Frieson resides in [place]. Screening for potential [facility] care coordination services as benefit of insurance plan and PCP.  Secure communication sent to SNF SW to inquire about transition plans and needs.   Will follow.    Marthenia Rolling, MSN, RN,BSN [place] Acute Care Coordinator [phone] (Direct dial)

## 2022-05-06 ENCOUNTER — Other Ambulatory Visit: Payer: Self-pay | Admitting: *Deleted

## 2022-05-06 NOTE — Patient Outreach (Signed)
[facility] Post- Acute Care Coordinator follow up. Mrs. Slemmer resides in [facility]. Screening for potential [facility] care coordination needs as benefit of insurance plan and PCP.   Previous update received from [facility] social worker indicating Mrs. Holohan will return to [facility] upon SNF discharge.    Marthenia Rolling, MSN, RN,BSN [place] Acute Care Coordinator [phone] (Direct dial)

## 2022-06-03 ENCOUNTER — Other Ambulatory Visit: Payer: Self-pay | Admitting: *Deleted

## 2022-06-03 NOTE — Patient Outreach (Signed)
[place] Coordinator follow up. Verified in [facility] Mrs. Huy discharged from [facility] on 05/31/22.  Mrs. Scaturro transitioned to [facility].   No identifiable [facility] care coordination needs.  Marthenia Rolling, MSN, RN,BSN [place] Acute Care Coordinator [phone] (Direct dial)

## 2022-09-18 ENCOUNTER — Telehealth: Payer: Medicare Other | Admitting: Internal Medicine
